# Patient Record
Sex: Female | Born: 1945 | Race: White | Hispanic: No | Smoking: Never smoker
Health system: Southern US, Community
[De-identification: ages and names within clinical notes are randomized; demographics above are authoritative.]

## PROBLEM LIST (undated history)

## (undated) DIAGNOSIS — K5790 Diverticulosis of intestine, part unspecified, without perforation or abscess without bleeding: Secondary | ICD-10-CM

## (undated) DIAGNOSIS — M1990 Unspecified osteoarthritis, unspecified site: Secondary | ICD-10-CM

## (undated) DIAGNOSIS — D649 Anemia, unspecified: Secondary | ICD-10-CM

## (undated) DIAGNOSIS — D500 Iron deficiency anemia secondary to blood loss (chronic): Secondary | ICD-10-CM

## (undated) DIAGNOSIS — I10 Essential (primary) hypertension: Secondary | ICD-10-CM

## (undated) DIAGNOSIS — M47812 Spondylosis without myelopathy or radiculopathy, cervical region: Secondary | ICD-10-CM

## (undated) DIAGNOSIS — D631 Anemia in chronic kidney disease: Secondary | ICD-10-CM

## (undated) HISTORY — PX: ELBOW ARTHROPLASTY: SHX928

## (undated) HISTORY — PX: TOTAL SHOULDER ARTHROPLASTY: SHX126

## (undated) HISTORY — PX: JOINT REPLACEMENT: SHX530

## (undated) HISTORY — PX: HIP SURGERY: SHX245

---
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
## 1999-12-02 ENCOUNTER — Encounter: Payer: Self-pay | Admitting: Internal Medicine

## 1999-12-02 ENCOUNTER — Encounter: Admission: RE | Admit: 1999-12-02 | Discharge: 1999-12-02 | Payer: Self-pay | Admitting: Internal Medicine

## 2000-11-26 ENCOUNTER — Ambulatory Visit (HOSPITAL_COMMUNITY): Admission: RE | Admit: 2000-11-26 | Discharge: 2000-11-26 | Payer: Self-pay | Admitting: Gastroenterology

## 2000-12-29 ENCOUNTER — Encounter: Payer: Self-pay | Admitting: Internal Medicine

## 2000-12-29 ENCOUNTER — Encounter: Admission: RE | Admit: 2000-12-29 | Discharge: 2000-12-29 | Payer: Self-pay | Admitting: Internal Medicine

## 2002-04-27 ENCOUNTER — Encounter: Payer: Self-pay | Admitting: Internal Medicine

## 2002-04-27 ENCOUNTER — Encounter: Admission: RE | Admit: 2002-04-27 | Discharge: 2002-04-27 | Payer: Self-pay | Admitting: Internal Medicine

## 2003-05-01 ENCOUNTER — Encounter: Admission: RE | Admit: 2003-05-01 | Discharge: 2003-05-01 | Payer: Self-pay | Admitting: Internal Medicine

## 2003-05-10 ENCOUNTER — Other Ambulatory Visit: Admission: RE | Admit: 2003-05-10 | Discharge: 2003-05-10 | Payer: Self-pay | Admitting: Internal Medicine

## 2003-12-29 ENCOUNTER — Encounter: Admission: RE | Admit: 2003-12-29 | Discharge: 2003-12-29 | Payer: Self-pay | Admitting: Rheumatology

## 2004-01-11 ENCOUNTER — Other Ambulatory Visit: Admission: RE | Admit: 2004-01-11 | Discharge: 2004-01-11 | Payer: Self-pay | Admitting: Internal Medicine

## 2004-01-15 ENCOUNTER — Encounter: Admission: RE | Admit: 2004-01-15 | Discharge: 2004-01-15 | Payer: Self-pay | Admitting: Rheumatology

## 2004-02-12 ENCOUNTER — Other Ambulatory Visit: Admission: RE | Admit: 2004-02-12 | Discharge: 2004-02-12 | Payer: Self-pay | Admitting: Obstetrics and Gynecology

## 2004-05-22 ENCOUNTER — Other Ambulatory Visit: Admission: RE | Admit: 2004-05-22 | Discharge: 2004-05-22 | Payer: Self-pay | Admitting: Obstetrics and Gynecology

## 2004-07-30 ENCOUNTER — Encounter: Admission: RE | Admit: 2004-07-30 | Discharge: 2004-07-30 | Payer: Self-pay | Admitting: Internal Medicine

## 2004-08-01 ENCOUNTER — Other Ambulatory Visit: Admission: RE | Admit: 2004-08-01 | Discharge: 2004-08-01 | Payer: Self-pay | Admitting: Obstetrics and Gynecology

## 2004-09-16 ENCOUNTER — Other Ambulatory Visit: Admission: RE | Admit: 2004-09-16 | Discharge: 2004-09-16 | Payer: Self-pay | Admitting: Obstetrics and Gynecology

## 2004-10-03 ENCOUNTER — Encounter: Admission: RE | Admit: 2004-10-03 | Discharge: 2004-10-03 | Payer: Self-pay | Admitting: Obstetrics and Gynecology

## 2005-03-21 ENCOUNTER — Other Ambulatory Visit: Admission: RE | Admit: 2005-03-21 | Discharge: 2005-03-21 | Payer: Self-pay | Admitting: Obstetrics and Gynecology

## 2005-07-31 ENCOUNTER — Encounter: Admission: RE | Admit: 2005-07-31 | Discharge: 2005-07-31 | Payer: Self-pay | Admitting: Internal Medicine

## 2005-09-19 ENCOUNTER — Other Ambulatory Visit: Admission: RE | Admit: 2005-09-19 | Discharge: 2005-09-19 | Payer: Self-pay | Admitting: Obstetrics and Gynecology

## 2006-03-11 ENCOUNTER — Encounter: Admission: RE | Admit: 2006-03-11 | Discharge: 2006-03-11 | Payer: Self-pay | Admitting: *Deleted

## 2006-03-30 ENCOUNTER — Other Ambulatory Visit: Admission: RE | Admit: 2006-03-30 | Discharge: 2006-03-30 | Payer: Self-pay | Admitting: Obstetrics and Gynecology

## 2006-04-22 ENCOUNTER — Encounter: Admission: RE | Admit: 2006-04-22 | Discharge: 2006-04-22 | Payer: Self-pay | Admitting: Neurosurgery

## 2006-08-17 ENCOUNTER — Encounter: Admission: RE | Admit: 2006-08-17 | Discharge: 2006-08-17 | Payer: Self-pay | Admitting: Obstetrics and Gynecology

## 2006-09-24 ENCOUNTER — Other Ambulatory Visit: Admission: RE | Admit: 2006-09-24 | Discharge: 2006-09-24 | Payer: Self-pay | Admitting: Obstetrics and Gynecology

## 2007-03-24 ENCOUNTER — Other Ambulatory Visit: Admission: RE | Admit: 2007-03-24 | Discharge: 2007-03-24 | Payer: Self-pay | Admitting: Obstetrics and Gynecology

## 2007-07-21 ENCOUNTER — Other Ambulatory Visit: Admission: RE | Admit: 2007-07-21 | Discharge: 2007-07-21 | Payer: Self-pay | Admitting: Obstetrics and Gynecology

## 2007-08-23 ENCOUNTER — Encounter: Admission: RE | Admit: 2007-08-23 | Discharge: 2007-08-23 | Payer: Self-pay | Admitting: Obstetrics and Gynecology

## 2008-02-02 ENCOUNTER — Other Ambulatory Visit: Admission: RE | Admit: 2008-02-02 | Discharge: 2008-02-02 | Payer: Self-pay | Admitting: Obstetrics and Gynecology

## 2008-07-24 ENCOUNTER — Other Ambulatory Visit: Admission: RE | Admit: 2008-07-24 | Discharge: 2008-07-24 | Payer: Self-pay | Admitting: Obstetrics and Gynecology

## 2008-08-23 ENCOUNTER — Encounter: Admission: RE | Admit: 2008-08-23 | Discharge: 2008-08-23 | Payer: Self-pay | Admitting: Obstetrics and Gynecology

## 2008-12-26 ENCOUNTER — Other Ambulatory Visit: Admission: RE | Admit: 2008-12-26 | Discharge: 2008-12-26 | Payer: Self-pay | Admitting: Obstetrics and Gynecology

## 2009-06-26 ENCOUNTER — Other Ambulatory Visit: Admission: RE | Admit: 2009-06-26 | Discharge: 2009-06-26 | Payer: Self-pay | Admitting: Obstetrics and Gynecology

## 2009-07-06 ENCOUNTER — Ambulatory Visit: Payer: Self-pay | Admitting: Diagnostic Radiology

## 2009-07-06 ENCOUNTER — Emergency Department (HOSPITAL_BASED_OUTPATIENT_CLINIC_OR_DEPARTMENT_OTHER): Admission: EM | Admit: 2009-07-06 | Discharge: 2009-07-06 | Payer: Self-pay | Admitting: Emergency Medicine

## 2010-05-05 ENCOUNTER — Encounter: Payer: Self-pay | Admitting: *Deleted

## 2010-06-10 ENCOUNTER — Other Ambulatory Visit: Payer: Self-pay | Admitting: Obstetrics and Gynecology

## 2010-06-10 DIAGNOSIS — Z1231 Encounter for screening mammogram for malignant neoplasm of breast: Secondary | ICD-10-CM

## 2010-06-19 ENCOUNTER — Other Ambulatory Visit: Payer: Self-pay | Admitting: Obstetrics and Gynecology

## 2010-06-19 ENCOUNTER — Other Ambulatory Visit (HOSPITAL_COMMUNITY)
Admission: RE | Admit: 2010-06-19 | Discharge: 2010-06-19 | Disposition: A | Discharge status: Home / Self Care | Payer: BC Managed Care – PPO | Source: Ambulatory Visit | Attending: Obstetrics and Gynecology | Admitting: Obstetrics and Gynecology

## 2010-06-19 DIAGNOSIS — Z01419 Encounter for gynecological examination (general) (routine) without abnormal findings: Secondary | ICD-10-CM | POA: Insufficient documentation

## 2010-06-19 DIAGNOSIS — Z1159 Encounter for screening for other viral diseases: Secondary | ICD-10-CM | POA: Insufficient documentation

## 2010-06-24 ENCOUNTER — Ambulatory Visit
Admission: RE | Admit: 2010-06-24 | Discharge: 2010-06-24 | Disposition: A | Discharge status: Home / Self Care | Payer: BC Managed Care – PPO | Source: Ambulatory Visit | Attending: Obstetrics and Gynecology | Admitting: Obstetrics and Gynecology

## 2010-06-24 DIAGNOSIS — Z1231 Encounter for screening mammogram for malignant neoplasm of breast: Secondary | ICD-10-CM

## 2010-07-09 ENCOUNTER — Other Ambulatory Visit: Payer: Self-pay | Admitting: Obstetrics and Gynecology

## 2011-07-09 ENCOUNTER — Other Ambulatory Visit: Payer: Self-pay | Admitting: Obstetrics and Gynecology

## 2011-07-09 ENCOUNTER — Other Ambulatory Visit (HOSPITAL_COMMUNITY)
Admission: RE | Admit: 2011-07-09 | Discharge: 2011-07-09 | Disposition: A | Discharge status: Home / Self Care | Payer: PRIVATE HEALTH INSURANCE | Source: Ambulatory Visit | Attending: Obstetrics and Gynecology | Admitting: Obstetrics and Gynecology

## 2011-07-09 DIAGNOSIS — Z01419 Encounter for gynecological examination (general) (routine) without abnormal findings: Secondary | ICD-10-CM | POA: Insufficient documentation

## 2011-07-28 ENCOUNTER — Other Ambulatory Visit: Payer: Self-pay | Admitting: Internal Medicine

## 2011-07-28 DIAGNOSIS — Z1231 Encounter for screening mammogram for malignant neoplasm of breast: Secondary | ICD-10-CM

## 2011-08-04 ENCOUNTER — Ambulatory Visit
Admission: RE | Admit: 2011-08-04 | Discharge: 2011-08-04 | Disposition: A | Discharge status: Home / Self Care | Payer: PRIVATE HEALTH INSURANCE | Source: Ambulatory Visit | Attending: Internal Medicine | Admitting: Internal Medicine

## 2011-08-04 DIAGNOSIS — Z1231 Encounter for screening mammogram for malignant neoplasm of breast: Secondary | ICD-10-CM

## 2012-05-07 ENCOUNTER — Ambulatory Visit: Payer: BC Managed Care – PPO

## 2012-05-07 ENCOUNTER — Telehealth: Payer: Self-pay | Admitting: Radiology

## 2012-05-07 ENCOUNTER — Ambulatory Visit (INDEPENDENT_AMBULATORY_CARE_PROVIDER_SITE_OTHER): Payer: BC Managed Care – PPO | Admitting: Internal Medicine

## 2012-05-07 VITALS — BP 129/65 | HR 97 | Temp 98.3°F | Resp 16 | Ht 62.0 in | Wt 131.0 lb

## 2012-05-07 DIAGNOSIS — W19XXXA Unspecified fall, initial encounter: Secondary | ICD-10-CM

## 2012-05-07 DIAGNOSIS — Z23 Encounter for immunization: Secondary | ICD-10-CM

## 2012-05-07 DIAGNOSIS — S0180XA Unspecified open wound of other part of head, initial encounter: Secondary | ICD-10-CM

## 2012-05-07 DIAGNOSIS — S0990XA Unspecified injury of head, initial encounter: Secondary | ICD-10-CM

## 2012-05-07 DIAGNOSIS — I10 Essential (primary) hypertension: Secondary | ICD-10-CM | POA: Insufficient documentation

## 2012-05-07 DIAGNOSIS — R079 Chest pain, unspecified: Secondary | ICD-10-CM

## 2012-05-07 DIAGNOSIS — D649 Anemia, unspecified: Secondary | ICD-10-CM

## 2012-05-07 DIAGNOSIS — M069 Rheumatoid arthritis, unspecified: Secondary | ICD-10-CM | POA: Insufficient documentation

## 2012-05-07 DIAGNOSIS — S20219A Contusion of unspecified front wall of thorax, initial encounter: Secondary | ICD-10-CM

## 2012-05-07 DIAGNOSIS — R42 Dizziness and giddiness: Secondary | ICD-10-CM

## 2012-05-07 LAB — COMPREHENSIVE METABOLIC PANEL
AST: 36 U/L (ref 0–37)
Alkaline Phosphatase: 73 U/L (ref 39–117)
BUN: 28 mg/dL — ABNORMAL HIGH (ref 6–23)
Creat: 1.19 mg/dL — ABNORMAL HIGH (ref 0.50–1.10)
Glucose, Bld: 99 mg/dL (ref 70–99)
Potassium: 4 mEq/L (ref 3.5–5.3)
Total Bilirubin: 0.3 mg/dL (ref 0.3–1.2)

## 2012-05-07 LAB — POCT CBC
Granulocyte percent: 63.7 %G (ref 37–80)
Lymph, poc: 3.2 (ref 0.6–3.4)
MCH, POC: 27.5 pg (ref 27–31.2)
MCHC: 30.7 g/dL — AB (ref 31.8–35.4)
MCV: 89.6 fL (ref 80–97)
MID (cbc): 1.3 — AB (ref 0–0.9)
POC LYMPH PERCENT: 26.1 %L (ref 10–50)
Platelet Count, POC: 317 10*3/uL (ref 142–424)
RDW, POC: 15.5 %

## 2012-05-07 MED ORDER — HYDROCODONE-ACETAMINOPHEN 5-325 MG PO TABS
1.0000 | ORAL_TABLET | Freq: Four times a day (QID) | ORAL | Status: DC | PRN
Start: 2012-05-07 — End: 2012-05-20

## 2012-05-07 NOTE — Progress Notes (Signed)
Subjective:    Patient ID: Mallory Murphy, female    DOB: 12/01/1945, 67 y.o.   MRN: [id number]  HPI she fell at work today striking her head near the right in her right anterior chest on the concrete floor No loss of consciousness/laceration of face/pain in ribs on the right with deep breathing or movement No headache History of dizziness with abrupt postural changes over the last month/no vertigo/no dizziness while sitting No palpitations or chest pain No history of arrhythmia or heart disease other than hypertension Past history of anemia thought secondary to rheumatoid arthritis No other recent change and health There is no history of other falls   Past medical history-rheumatoid arthritis -Hypertension -On Lexapro -History of anemia as low as 9  Primary care-Dr. Pol[facility]//last labs almost one year ago  Review of Systems No vision changes/no headaches/no sinus issues/no hearing changes No history of thyroid problems Chest pain palpitations edema No dyspnea on exertion No GI upset     Objective:   Physical Exam BP 129/65  Pulse 97  Temp 98.3 F (36.8 C) (Oral)  Resp 16  Ht 5\' 2"  (1.575 m)  Wt 131 lb (59.421 kg)  BMI 23.96 kg/m2  SpO2 100% In no acute distress Pupils equal round reactive to light and accommodation/extraocular movements intact There is a 3 cm laceration lateral to the right eyebrow/no deep structures are exposed Nose and teeth are clear/no orbital ecchymoses Neck is supple Heart is regular without murmur Lungs are clear There is an area of tenderness over the right lower anterior rib cage but no defect or ecchymoses Abdomen is supple  Cranial nerves II through XII are intact. No sensory or motor losses. Gait is normal. Romberg negative     [facility] reading (PRIMARY) by  Dr.Delonda Coley= old fracture of the right third rib but no new fractures EKG with nonspecific ST changes Results for orders placed in visit on 05/07/12  POCT CBC   Component Value Range   WBC 12.4 (*) 4.6 - 10.2 K/uL   Lymph, poc 3.2  0.6 - 3.4   POC LYMPH PERCENT 26.1  10 - 50 %L   MID (cbc) 1.3 (*) 0 - 0.9   POC MID % 10.2  0 - 12 %M   POC Granulocyte 7.9 (*) 2 - 6.9   Granulocyte percent 63.7  37 - 80 %G   RBC 3.67 (*) 4.04 - 5.48 M/uL   Hemoglobin 10.1 (*) 12.2 - 16.2 g/dL   HCT, POC 40.9 (*) 81.1 - 47.9 %   MCV 89.6  80 - 97 fL   MCH, POC 27.5  27 - 31.2 pg   MCHC 30.7 (*) 31.8 - 35.4 g/dL   RDW, POC 91.4     Platelet Count, POC 317  142 - 424 K/uL   MPV 7.1  0 - 99.8 fL       Assessment & Plan:   1. Contusion of chest  DG Ribs Unilateral W/Chest Right, EKG 12-Lead  2. HTN (hypertension)    3. Arthritis, rheumatoid    4. Fall    5. Dizziness  Comprehensive metabolic panel  6. Anemia  POCT CBC  7. Need for prophylactic vaccination and inoculation against influenza  Flu vaccine greater than or equal to 3yo preservative free IM  8. Chest pain    9. Open wound, face, without complication    10. Head injury, acute, without loss of consciousness     Procedure the PA-C Marte Hydrocodone 5325 if needed Last  tetanus 2009 Suture removal 7d

## 2012-05-07 NOTE — Progress Notes (Signed)
Patient ID: SISTER CARBONE MRN: [id number], DOB: 1945-10-13, 67 y.o. Date of Encounter: 05/07/2012, 3:41 PM   PROCEDURE NOTE: Verbal consent obtained. Sterile technique employed. Numbing: Anesthesia obtained with 1% lidocaine with epinephrine.   Cleansed with soap and water. Irrigated.  Wound explored, no deep structures involved, no foreign bodies.   Wound repaired with # 5 SI and #1 purse string suture.  Hemostasis obtained. Wound cleansed and dressed.  Wound care instructions including precautions covered with patient. Handout given.  Anticipate suture removal in 5 days  Rhoderick Moody, PA-C 05/07/2012 3:41 PM

## 2012-05-07 NOTE — Telephone Encounter (Signed)
Last Tetanus was 2009 per her PCP office.

## 2012-05-07 NOTE — Patient Instructions (Addendum)
WOUND CARE Please return in 5 days to have your stitches/staples removed or sooner if you have concerns.  Keep area clean and dry for 24 hours. Do not remove bandage, if applied.  After 24 hours, remove bandage and wash wound gently with mild soap and warm water. Reapply a new bandage after cleaning wound, if directed.  Continue daily cleansing with soap and water until stitches/staples are removed.  Do not apply any ointments or creams to the wound while stitches/staples are in place, as this may cause delayed healing.  Notify the office if you experience any of the following signs of infection: Swelling, redness, pus drainage, streaking, fever >101.0 F  Notify the office if you experience excessive bleeding that does not stop after 15-20 minutes of constant, firm pressure.  

## 2012-05-09 ENCOUNTER — Encounter: Payer: Self-pay | Admitting: Internal Medicine

## 2012-05-13 ENCOUNTER — Ambulatory Visit (INDEPENDENT_AMBULATORY_CARE_PROVIDER_SITE_OTHER): Payer: BC Managed Care – PPO | Admitting: Physician Assistant

## 2012-05-13 VITALS — BP 124/62 | HR 97 | Temp 98.1°F | Resp 16 | Ht 62.0 in | Wt 134.8 lb

## 2012-05-13 DIAGNOSIS — S0180XA Unspecified open wound of other part of head, initial encounter: Secondary | ICD-10-CM

## 2012-05-13 DIAGNOSIS — Z4802 Encounter for removal of sutures: Secondary | ICD-10-CM

## 2012-05-13 NOTE — Progress Notes (Signed)
   Patient ID: Mallory Murphy MRN: [id number], DOB: 08-Jun-1945 67 y.o. Date of Encounter: 05/13/2012, 2:53 PM  Primary Physician: No primary provider on file.  Chief Complaint: Suture removal    See note from 05/07/12  HPI: 67 y.o. y/o female with injury to right eyebrow.  Here for suture removal s/p placement on 05/07/12 Doing well Afebrile/ No chills No erythema No pain Normal sensation No headache, dizziness, or visual changes.  Still with some soreness along her right lateral ribs.  This is improved from her initial visit.  She is performing deep breathing exercises at home.  Negative rib film and CXR at initial visit.   No past medical history on file.   Home Meds: Prior to Admission medications   Medication Sig Start Date End Date Taking? Authorizing Provider  escitalopram (LEXAPRO) 10 MG tablet Take 10 mg by mouth daily.   Yes Historical Provider, MD  hydrochlorothiazide (MICROZIDE) 12.5 MG capsule Take 12.5 mg by mouth daily.   Yes Historical Provider, MD  HYDROcodone-acetaminophen (NORCO/VICODIN) 5-325 MG per tablet Take 1 tablet by mouth every 6 (six) hours as needed for pain. 05/07/12  Yes Tonye Pearson, MD  lisinopril (PRINIVIL,ZESTRIL) 40 MG tablet Take 40 mg by mouth daily.   Yes Historical Provider, MD  meloxicam (MOBIC) 15 MG tablet Take 15 mg by mouth daily.   Yes Historical Provider, MD  tolterodine (DETROL) 2 MG tablet Take 2 mg by mouth daily.   Yes Historical Provider, MD  traMADol (ULTRAM) 50 MG tablet Take 50 mg by mouth every 8 (eight) hours as needed.   Yes Historical Provider, MD    Allergies:  Allergies  Allergen Reactions  . Macrobid (Nitrofurantoin Macrocrystal) Nausea Only    Physical Exam: Blood pressure 124/62, pulse 97, temperature 98.1 F (36.7 C), temperature source Oral, resp. rate 16, height 5\' 2"  (1.575 m), weight 134 lb 12.8 oz (61.145 kg), SpO2 97.00%., Body mass index is 24.66 kg/(m^2). General: Well developed, well nourished,  in no acute distress. Head: Normocephalic, atraumatic, sclera non-icteric, no xanthomas, nares are without discharge.  Neck: Supple. Lungs: Breathing is unlabored. Heart: Normal rate. Msk:  Strength and tone appear normal for age. Wound: Wound well healed without erythema, swelling, or tenderness to palpation.  Skin: See above, otherwise dry without rash or erythema. Extremities: No clubbing or cyanosis. No edema. Neuro: Alert and oriented X 3. Moves all extremities spontaneously.  Psych:  Responds to questions appropriately with a normal affect.   PROCEDURE: Verbal consent obtained. 5 simple interrupted sutures and 1 purse string suture removed without difficulty.  Assessment and Plan: 67 y.o. y/o female here for suture removal for wound described above and rib contusion. 1) wound -Sutures removed per above -Wound resolved -RTC prn  2) Rib contusion  -Continue deep breathing exercises -Improving -Call if still symptomatic in one week  Signed, Eula Listen, PA-C 05/13/2012 2:53 PM

## 2012-05-20 ENCOUNTER — Telehealth: Payer: Self-pay

## 2012-05-20 MED ORDER — HYDROCODONE-ACETAMINOPHEN 5-325 MG PO TABS
1.0000 | ORAL_TABLET | Freq: Four times a day (QID) | ORAL | Status: DC | PRN
Start: 2012-05-20 — End: 2016-05-28

## 2012-05-20 NOTE — Telephone Encounter (Signed)
Patient states she is still having some pain with her ribs esp with bending forward or with deep breath, wants to know if normal. She feels "catching" she wants to know if she can get a refill on her Hydrocodone. She also wants to know if anything else needs to be done for continued c/o rib pain,states it is better but still painful/ please advise.

## 2012-05-20 NOTE — Telephone Encounter (Signed)
PT STATES SHE WAS SEEN FOR A RIB INJURY AND WAS TOLD TO CALL BACK IF SHE WAS STILL IN PAIN AND SHE IS WOULD LIKE TO SPEAK WITH RYAN OR HIS NURSE AT [phone] OR HER HOME AT [phone]

## 2012-05-20 NOTE — Telephone Encounter (Signed)
Since I saw the patient for suture removal of her eyelid only and advised her at that time that it will take sometime for her ribs to heal I will refill her medication, but advise that she should recheck as well. I have printed and signed the Norco prescription. It is at the TL desk.

## 2012-05-20 NOTE — Telephone Encounter (Signed)
Patient returned call. I advised and she voiced understanding. I called in her script for hydrocodone to [address] in [place]

## 2012-09-28 ENCOUNTER — Other Ambulatory Visit: Payer: Self-pay | Admitting: Dermatology

## 2012-10-26 ENCOUNTER — Other Ambulatory Visit: Payer: Self-pay

## 2012-10-26 DIAGNOSIS — Z1231 Encounter for screening mammogram for malignant neoplasm of breast: Secondary | ICD-10-CM

## 2012-11-16 ENCOUNTER — Ambulatory Visit: Payer: PRIVATE HEALTH INSURANCE

## 2012-11-29 ENCOUNTER — Ambulatory Visit: Payer: PRIVATE HEALTH INSURANCE

## 2013-02-28 ENCOUNTER — Ambulatory Visit
Admission: RE | Admit: 2013-02-28 | Discharge: 2013-02-28 | Disposition: A | Discharge status: Home / Self Care | Payer: BC Managed Care – PPO | Source: Ambulatory Visit

## 2013-02-28 DIAGNOSIS — Z1231 Encounter for screening mammogram for malignant neoplasm of breast: Secondary | ICD-10-CM

## 2013-05-17 ENCOUNTER — Emergency Department (HOSPITAL_BASED_OUTPATIENT_CLINIC_OR_DEPARTMENT_OTHER)
Admission: EM | Admit: 2013-05-17 | Discharge: 2013-05-17 | Disposition: A | Discharge status: Home / Self Care | Payer: 59 | Attending: Emergency Medicine | Admitting: Emergency Medicine

## 2013-05-17 ENCOUNTER — Encounter (HOSPITAL_BASED_OUTPATIENT_CLINIC_OR_DEPARTMENT_OTHER): Payer: Self-pay | Admitting: Emergency Medicine

## 2013-05-17 DIAGNOSIS — K529 Noninfective gastroenteritis and colitis, unspecified: Secondary | ICD-10-CM

## 2013-05-17 DIAGNOSIS — Z791 Long term (current) use of non-steroidal anti-inflammatories (NSAID): Secondary | ICD-10-CM | POA: Insufficient documentation

## 2013-05-17 DIAGNOSIS — I10 Essential (primary) hypertension: Secondary | ICD-10-CM | POA: Insufficient documentation

## 2013-05-17 DIAGNOSIS — R6883 Chills (without fever): Secondary | ICD-10-CM | POA: Insufficient documentation

## 2013-05-17 DIAGNOSIS — K5289 Other specified noninfective gastroenteritis and colitis: Secondary | ICD-10-CM | POA: Insufficient documentation

## 2013-05-17 DIAGNOSIS — M069 Rheumatoid arthritis, unspecified: Secondary | ICD-10-CM | POA: Insufficient documentation

## 2013-05-17 DIAGNOSIS — Z3202 Encounter for pregnancy test, result negative: Secondary | ICD-10-CM | POA: Insufficient documentation

## 2013-05-17 DIAGNOSIS — Z79899 Other long term (current) drug therapy: Secondary | ICD-10-CM | POA: Insufficient documentation

## 2013-05-17 HISTORY — DX: Unspecified osteoarthritis, unspecified site: M19.90

## 2013-05-17 HISTORY — DX: Essential (primary) hypertension: I10

## 2013-05-17 LAB — COMPREHENSIVE METABOLIC PANEL
ALT: 9 U/L (ref 0–35)
AST: 22 U/L (ref 0–37)
Albumin: 4.1 g/dL (ref 3.5–5.2)
Alkaline Phosphatase: 69 U/L (ref 39–117)
BUN: 15 mg/dL (ref 6–23)
CO2: 24 mEq/L (ref 19–32)
Calcium: 9.4 mg/dL (ref 8.4–10.5)
Chloride: 100 mEq/L (ref 96–112)
Creatinine, Ser: 1 mg/dL (ref 0.50–1.10)
GFR calc Af Amer: 66 mL/min — ABNORMAL LOW (ref >90)
GFR calc non Af Amer: 57 mL/min — ABNORMAL LOW (ref >90)
Glucose, Bld: 137 mg/dL — ABNORMAL HIGH (ref 70–99)
Potassium: 3.2 mEq/L — ABNORMAL LOW (ref 3.7–5.3)
Sodium: 141 mEq/L (ref 137–147)
Total Bilirubin: 0.8 mg/dL (ref 0.3–1.2)
Total Protein: 8.3 g/dL (ref 6.0–8.3)

## 2013-05-17 LAB — CBC WITH DIFFERENTIAL/PLATELET
Basophils Absolute: 0 10*3/uL (ref 0.0–0.1)
Basophils Relative: 0 % (ref 0–1)
Eosinophils Absolute: 0 10*3/uL (ref 0.0–0.7)
Eosinophils Relative: 0 % (ref 0–5)
HCT: 33.8 % — ABNORMAL LOW (ref 36.0–46.0)
Hemoglobin: 11.4 g/dL — ABNORMAL LOW (ref 12.0–15.0)
Lymphocytes Relative: 13 % (ref 12–46)
Lymphs Abs: 1.3 10*3/uL (ref 0.7–4.0)
MCH: 30.7 pg (ref 26.0–34.0)
MCHC: 33.7 g/dL (ref 30.0–36.0)
MCV: 91.1 fL (ref 78.0–100.0)
Monocytes Absolute: 0.8 10*3/uL (ref 0.1–1.0)
Monocytes Relative: 8 % (ref 3–12)
Neutro Abs: 8.1 10*3/uL — ABNORMAL HIGH (ref 1.7–7.7)
Neutrophils Relative %: 79 % — ABNORMAL HIGH (ref 43–77)
Platelets: 280 10*3/uL (ref 150–400)
RBC: 3.71 MIL/uL — ABNORMAL LOW (ref 3.87–5.11)
RDW: 14.7 % (ref 11.5–15.5)
WBC: 10.3 10*3/uL (ref 4.0–10.5)

## 2013-05-17 LAB — PREGNANCY, URINE: Preg Test, Ur: NEGATIVE

## 2013-05-17 LAB — URINE MICROSCOPIC-ADD ON

## 2013-05-17 LAB — URINALYSIS, ROUTINE W REFLEX MICROSCOPIC
Bilirubin Urine: NEGATIVE
Glucose, UA: NEGATIVE mg/dL
Ketones, ur: 15 mg/dL — AB
Nitrite: NEGATIVE
Protein, ur: NEGATIVE mg/dL
SPECIFIC GRAVITY, URINE: 1.017 (ref 1.005–1.030)
UROBILINOGEN UA: 1 mg/dL (ref 0.0–1.0)
pH: 5.5 (ref 5.0–8.0)

## 2013-05-17 LAB — LIPASE, BLOOD: Lipase: 20 U/L (ref 11–59)

## 2013-05-17 MED ORDER — ONDANSETRON 8 MG PO TBDP: ORAL_TABLET | ORAL | Status: DC

## 2013-05-17 MED ORDER — CIPROFLOXACIN HCL 500 MG PO TABS: 500.0000 mg | ORAL_TABLET | Freq: Two times a day (BID) | ORAL | Status: DC

## 2013-05-17 MED ORDER — ONDANSETRON HCL 4 MG/2ML IJ SOLN
4.0000 mg | Freq: Once | INTRAMUSCULAR | Status: AC
  Administered 2013-05-17: 4 mg via INTRAVENOUS
  Filled 2013-05-17: qty 2

## 2013-05-17 MED ORDER — SODIUM CHLORIDE 0.9 % IV SOLN
Freq: Once | INTRAVENOUS | Status: AC
  Administered 2013-05-17: 11:00:00 via INTRAVENOUS

## 2013-05-17 NOTE — Discharge Instructions (Signed)
Zofran as prescribed as needed for nausea.  Return to the emergency department if you develop severe abdominal pain, bloody stool, no urine output in 12 hours, or other new or concerning symptoms.   Viral Gastroenteritis Viral gastroenteritis is also known as stomach flu. This condition affects the stomach and intestinal tract. It can cause sudden diarrhea and vomiting. The illness typically lasts 3 to 8 days. Most people develop an immune response that eventually gets rid of the virus. While this natural response develops, the virus can make you quite ill. CAUSES  Many different viruses can cause gastroenteritis, such as rotavirus or noroviruses. You can catch one of these viruses by consuming contaminated food or water. You may also catch a virus by sharing utensils or other personal items with an infected person or by touching a contaminated surface. SYMPTOMS  The most common symptoms are diarrhea and vomiting. These problems can cause a severe loss of body fluids (dehydration) and a body salt (electrolyte) imbalance. Other symptoms may include:  Fever.  Headache.  Fatigue.  Abdominal pain. DIAGNOSIS  Your caregiver can usually diagnose viral gastroenteritis based on your symptoms and a physical exam. A stool sample may also be taken to test for the presence of viruses or other infections. TREATMENT  This illness typically goes away on its own. Treatments are aimed at rehydration. The most serious cases of viral gastroenteritis involve vomiting so severely that you are not able to keep fluids down. In these cases, fluids must be given through an intravenous line (IV). HOME CARE INSTRUCTIONS   Drink enough fluids to keep your urine clear or pale yellow. Drink small amounts of fluids frequently and increase the amounts as tolerated.  Ask your caregiver for specific rehydration instructions.  Avoid:  Foods high in sugar.  Alcohol.  Carbonated  drinks.  Tobacco.  Juice.  Caffeine drinks.  Extremely hot or cold fluids.  Fatty, greasy foods.  Too much intake of anything at one time.  Dairy products until 24 to 48 hours after diarrhea stops.  You may consume probiotics. Probiotics are active cultures of beneficial bacteria. They may lessen the amount and number of diarrheal stools in adults. Probiotics can be found in yogurt with active cultures and in supplements.  Wash your hands well to avoid spreading the virus.  Only take over-the-counter or prescription medicines for pain, discomfort, or fever as directed by your caregiver. Do not give aspirin to children. Antidiarrheal medicines are not recommended.  Ask your caregiver if you should continue to take your regular prescribed and over-the-counter medicines.  Keep all follow-up appointments as directed by your caregiver. SEEK IMMEDIATE MEDICAL CARE IF:   You are unable to keep fluids down.  You do not urinate at least once every 6 to 8 hours.  You develop shortness of breath.  You notice blood in your stool or vomit. This may look like coffee grounds.  You have abdominal pain that increases or is concentrated in one small area (localized).  You have persistent vomiting or diarrhea.  You have a fever.  The patient is a child younger than 3 months, and he or she has a fever.  The patient is a child older than 3 months, and he or she has a fever and persistent symptoms.  The patient is a child older than 3 months, and he or she has a fever and symptoms suddenly get worse.  The patient is a baby, and he or she has no tears when crying. MAKE SURE  YOU:   Understand these instructions.  Will watch your condition.  Will get help right away if you are not doing well or get worse. Document Released: 03/31/2005 Document Revised: 06/23/2011 Document Reviewed: 01/15/2011 [facility] Patient Information 2014 [place].

## 2013-05-17 NOTE — ED Provider Notes (Signed)
CSN: [id number]     Arrival date & time 05/17/13  9470 History   First MD Initiated Contact with Patient 05/17/13 1044     Chief Complaint  Patient presents with  . Emesis  . Diarrhea   (Consider location/radiation/quality/duration/timing/severity/associated sxs/prior Treatment) HPI Comments: Patient is a 68 year old female with history of hypertension and rheumatoid arthritis. She presents today with complaints of abdominal cramping with nausea vomiting and diarrhea for the past 4 days. All of this has been nonbloody. She denies any ill contacts.  Patient is a 68 y.o. female presenting with vomiting. The history is provided by the patient.  Emesis Severity:  Moderate Duration:  5 days Timing:  Constant Progression:  Worsening Chronicity:  New Recent urination:  Normal Relieved by:  Nothing Worsened by:  Nothing tried Ineffective treatments:  None tried Associated symptoms: abdominal pain, chills, diarrhea and fever     Past Medical History  Diagnosis Date  . Hypertension   . Arthritis    Past Surgical History  Procedure Laterality Date  . Joint replacement     No family history on file. History  Substance Use Topics  . Smoking status: Never Smoker   . Smokeless tobacco: Not on file  . Alcohol Use: Yes   OB History   Grav Para Term Preterm Abortions TAB SAB Ect Mult Living                 Review of Systems  Constitutional: Positive for chills.  Gastrointestinal: Positive for vomiting, abdominal pain and diarrhea.  All other systems reviewed and are negative.    Allergies  Macrobid  Home Medications   Current Outpatient Rx  Name  Route  Sig  Dispense  Refill  . escitalopram (LEXAPRO) 10 MG tablet   Oral   Take 10 mg by mouth daily.         . hydrochlorothiazide (MICROZIDE) 12.5 MG capsule   Oral   Take 12.5 mg by mouth daily.         Marland Kitchen HYDROcodone-acetaminophen (NORCO/VICODIN) 5-325 MG per tablet   Oral   Take 1 tablet by mouth every 6 (six)  hours as needed for pain.   15 tablet   0   . lisinopril (PRINIVIL,ZESTRIL) 40 MG tablet   Oral   Take 40 mg by mouth daily.         . meloxicam (MOBIC) 15 MG tablet   Oral   Take 15 mg by mouth daily.         Marland Kitchen tolterodine (DETROL) 2 MG tablet   Oral   Take 2 mg by mouth daily.         . traMADol (ULTRAM) 50 MG tablet   Oral   Take 50 mg by mouth every 8 (eight) hours as needed.          Pulse 84  Temp(Src) 98.5 F (36.9 C) (Oral)  Resp 18  Ht 5\' 2"  (1.575 m)  Wt 130 lb (58.968 kg)  BMI 23.77 kg/m2  SpO2 100% Physical Exam  Nursing note and vitals reviewed. Constitutional: She is oriented to person, place, and time. She appears well-developed and well-nourished. No distress.  HENT:  Head: Normocephalic and atraumatic.  Mucous membranes appear somewhat dry.  Neck: Normal range of motion. Neck supple.  Cardiovascular: Normal rate and regular rhythm.  Exam reveals no gallop and no friction rub.   No murmur heard. Pulmonary/Chest: Effort normal and breath sounds normal. No respiratory distress. She has no wheezes.  Abdominal:  Soft. Bowel sounds are normal. She exhibits no distension. There is no tenderness.  Musculoskeletal: Normal range of motion.  Neurological: She is alert and oriented to person, place, and time.  Skin: Skin is warm and dry. She is not diaphoretic.    ED Course  Procedures (including critical care time) Labs Review Labs Reviewed  CBC WITH DIFFERENTIAL  COMPREHENSIVE METABOLIC PANEL  LIPASE, BLOOD  URINALYSIS, ROUTINE W REFLEX MICROSCOPIC   Imaging Review No results found.    MDM  No diagnosis found. Patient presents here with a several day history of nausea vomiting and diarrhea. Her symptoms sound viral in nature and physical examination and laboratory studies are consistent with this as well. She was given intravenous fluids and Zofran and is now feeling better. Her UA reveals evidence for a UTI. We'll treat with Cipro and  discharged to home to return as needed if symptoms worsen or change.    Veryl Speak, MD 05/17/13 [phone]

## 2013-05-17 NOTE — ED Notes (Signed)
Pt c/o n/v/d and abdominal cramping with n/v/d x 4 days. Pt sts last diarrhea 3 days ago.

## 2013-05-19 LAB — URINE CULTURE: Colony Count: >=100000

## 2013-05-21 ENCOUNTER — Telehealth (HOSPITAL_COMMUNITY): Payer: Self-pay | Admitting: Emergency Medicine

## 2013-05-21 NOTE — ED Notes (Signed)
Post ED Visit - Positive Culture Follow-up  Culture report reviewed by antimicrobial stewardship pharmacist: []  Wes Dulaney, Pharm.D., BCPS []  Heide Guile, Pharm.D., BCPS []  Alycia Rossetti, Pharm.D., BCPS [x]  [place], [place].D., BCPS, AAHIVP []  Legrand Como, Pharm.D., BCPS, AAHIVP  Positive urine culture Treated with Cipro, organism sensitive to the same and no further patient follow-up is required at this time.  Myrna Blazer 05/21/2013, 9:54 AM

## 2013-08-29 ENCOUNTER — Other Ambulatory Visit (HOSPITAL_COMMUNITY)
Admission: RE | Admit: 2013-08-29 | Discharge: 2013-08-29 | Disposition: A | Discharge status: Home / Self Care | Payer: 59 | Source: Ambulatory Visit | Attending: Obstetrics and Gynecology | Admitting: Obstetrics and Gynecology

## 2013-08-29 ENCOUNTER — Other Ambulatory Visit: Payer: Self-pay | Admitting: Obstetrics and Gynecology

## 2013-08-29 DIAGNOSIS — Z1151 Encounter for screening for human papillomavirus (HPV): Secondary | ICD-10-CM | POA: Insufficient documentation

## 2013-08-29 DIAGNOSIS — Z01419 Encounter for gynecological examination (general) (routine) without abnormal findings: Secondary | ICD-10-CM | POA: Insufficient documentation

## 2014-03-23 ENCOUNTER — Other Ambulatory Visit: Payer: Self-pay

## 2014-03-23 ENCOUNTER — Other Ambulatory Visit: Payer: Self-pay | Admitting: Obstetrics and Gynecology

## 2014-03-23 DIAGNOSIS — Z1231 Encounter for screening mammogram for malignant neoplasm of breast: Secondary | ICD-10-CM

## 2014-04-13 ENCOUNTER — Inpatient Hospital Stay: Admission: RE | Admit: 2014-04-13 | Payer: BC Managed Care – PPO | Source: Ambulatory Visit

## 2014-06-21 ENCOUNTER — Observation Stay (HOSPITAL_COMMUNITY)
Admission: EM | Admit: 2014-06-21 | Discharge: 2014-06-22 | Disposition: A | Discharge status: Home / Self Care | Payer: Medicare Other | Attending: Internal Medicine | Admitting: Internal Medicine

## 2014-06-21 ENCOUNTER — Emergency Department (HOSPITAL_COMMUNITY): Payer: Medicare Other

## 2014-06-21 ENCOUNTER — Encounter (HOSPITAL_COMMUNITY): Payer: Self-pay | Admitting: *Deleted

## 2014-06-21 DIAGNOSIS — R42 Dizziness and giddiness: Secondary | ICD-10-CM | POA: Insufficient documentation

## 2014-06-21 DIAGNOSIS — R55 Syncope and collapse: Principal | ICD-10-CM | POA: Insufficient documentation

## 2014-06-21 DIAGNOSIS — N179 Acute kidney failure, unspecified: Secondary | ICD-10-CM | POA: Diagnosis not present

## 2014-06-21 DIAGNOSIS — E876 Hypokalemia: Secondary | ICD-10-CM | POA: Diagnosis not present

## 2014-06-21 DIAGNOSIS — R002 Palpitations: Secondary | ICD-10-CM | POA: Diagnosis present

## 2014-06-21 DIAGNOSIS — I10 Essential (primary) hypertension: Secondary | ICD-10-CM | POA: Diagnosis not present

## 2014-06-21 DIAGNOSIS — M069 Rheumatoid arthritis, unspecified: Secondary | ICD-10-CM

## 2014-06-21 DIAGNOSIS — Z23 Encounter for immunization: Secondary | ICD-10-CM | POA: Diagnosis not present

## 2014-06-21 DIAGNOSIS — E860 Dehydration: Secondary | ICD-10-CM | POA: Diagnosis not present

## 2014-06-21 DIAGNOSIS — I951 Orthostatic hypotension: Secondary | ICD-10-CM | POA: Diagnosis present

## 2014-06-21 LAB — BASIC METABOLIC PANEL
Anion gap: 13 (ref 5–15)
BUN: 22 mg/dL (ref 6–23)
CHLORIDE: 95 mmol/L — AB (ref 96–112)
CO2: 26 mmol/L (ref 19–32)
CREATININE: 1.96 mg/dL — AB (ref 0.50–1.10)
Calcium: 8.6 mg/dL (ref 8.4–10.5)
GFR calc Af Amer: 29 mL/min — ABNORMAL LOW (ref >90)
GFR, EST NON AFRICAN AMERICAN: 25 mL/min — AB (ref >90)
GLUCOSE: 100 mg/dL — AB (ref 70–99)
Potassium: 2.7 mmol/L — CL (ref 3.5–5.1)
SODIUM: 134 mmol/L — AB (ref 135–145)

## 2014-06-21 LAB — CBC
HCT: 31 % — ABNORMAL LOW (ref 36.0–46.0)
HEMOGLOBIN: 10.4 g/dL — AB (ref 12.0–15.0)
MCH: 30.5 pg (ref 26.0–34.0)
MCHC: 33.5 g/dL (ref 30.0–36.0)
MCV: 90.9 fL (ref 78.0–100.0)
Platelets: 327 10*3/uL (ref 150–400)
RBC: 3.41 MIL/uL — ABNORMAL LOW (ref 3.87–5.11)
RDW: 15.2 % (ref 11.5–15.5)
WBC: 10.3 10*3/uL (ref 4.0–10.5)

## 2014-06-21 LAB — MAGNESIUM: MAGNESIUM: 1.4 mg/dL — AB (ref 1.5–2.5)

## 2014-06-21 LAB — TROPONIN I

## 2014-06-21 LAB — I-STAT TROPONIN, ED: TROPONIN I, POC: 0 ng/mL (ref 0.00–0.08)

## 2014-06-21 LAB — BRAIN NATRIURETIC PEPTIDE: B Natriuretic Peptide: 103.7 pg/mL — ABNORMAL HIGH (ref 0.0–100.0)

## 2014-06-21 MED ORDER — ESCITALOPRAM OXALATE 10 MG PO TABS
10.0000 mg | ORAL_TABLET | Freq: Every day | ORAL | Status: DC
  Administered 2014-06-22: 10 mg via ORAL
  Filled 2014-06-21: qty 1

## 2014-06-21 MED ORDER — ONDANSETRON HCL 4 MG PO TABS: 4.0000 mg | ORAL_TABLET | Freq: Four times a day (QID) | ORAL | Status: DC | PRN

## 2014-06-21 MED ORDER — POTASSIUM CHLORIDE 10 MEQ/100ML IV SOLN
10.0000 meq | INTRAVENOUS | Status: AC
  Administered 2014-06-21 (×3): 10 meq via INTRAVENOUS
  Filled 2014-06-21 (×3): qty 100

## 2014-06-21 MED ORDER — INFLUENZA VAC SPLIT QUAD 0.5 ML IM SUSY
0.5000 mL | PREFILLED_SYRINGE | INTRAMUSCULAR | Status: DC
  Filled 2014-06-21: qty 0.5

## 2014-06-21 MED ORDER — METOPROLOL TARTRATE 25 MG PO TABS
25.0000 mg | ORAL_TABLET | Freq: Two times a day (BID) | ORAL | Status: DC
  Administered 2014-06-21 – 2014-06-22 (×2): 25 mg via ORAL
  Filled 2014-06-21 (×3): qty 1

## 2014-06-21 MED ORDER — POTASSIUM CHLORIDE CRYS ER 20 MEQ PO TBCR
40.0000 meq | EXTENDED_RELEASE_TABLET | Freq: Two times a day (BID) | ORAL | Status: DC
  Administered 2014-06-21: 40 meq via ORAL
  Filled 2014-06-21: qty 2

## 2014-06-21 MED ORDER — SODIUM CHLORIDE 0.9 % IJ SOLN
3.0000 mL | Freq: Two times a day (BID) | INTRAMUSCULAR | Status: DC
  Administered 2014-06-22: 3 mL via INTRAVENOUS

## 2014-06-21 MED ORDER — ACETAMINOPHEN 325 MG PO TABS: 650.0000 mg | ORAL_TABLET | Freq: Four times a day (QID) | ORAL | Status: DC | PRN

## 2014-06-21 MED ORDER — SODIUM CHLORIDE 0.9 % IV SOLN
INTRAVENOUS | Status: DC
  Administered 2014-06-21: 19:00:00 via INTRAVENOUS
  Administered 2014-06-22: 100 mL/h via INTRAVENOUS

## 2014-06-21 MED ORDER — TRAMADOL HCL 50 MG PO TABS: 50.0000 mg | ORAL_TABLET | Freq: Four times a day (QID) | ORAL | Status: DC | PRN

## 2014-06-21 MED ORDER — PNEUMOCOCCAL VAC POLYVALENT 25 MCG/0.5ML IJ INJ
0.5000 mL | INJECTION | INTRAMUSCULAR | Status: DC
  Filled 2014-06-21: qty 0.5

## 2014-06-21 MED ORDER — ACETAMINOPHEN 650 MG RE SUPP: 650.0000 mg | Freq: Four times a day (QID) | RECTAL | Status: DC | PRN

## 2014-06-21 MED ORDER — HEPARIN SODIUM (PORCINE) 5000 UNIT/ML IJ SOLN
5000.0000 [IU] | Freq: Three times a day (TID) | INTRAMUSCULAR | Status: DC
  Administered 2014-06-21 – 2014-06-22 (×2): 5000 [IU] via SUBCUTANEOUS
  Filled 2014-06-21 (×5): qty 1

## 2014-06-21 MED ORDER — SODIUM CHLORIDE 0.9 % IV BOLUS (SEPSIS)
1000.0000 mL | Freq: Once | INTRAVENOUS | Status: AC
  Administered 2014-06-21: 1000 mL via INTRAVENOUS

## 2014-06-21 MED ORDER — ONDANSETRON HCL 4 MG/2ML IJ SOLN: 4.0000 mg | Freq: Four times a day (QID) | INTRAMUSCULAR | Status: DC | PRN

## 2014-06-21 MED ORDER — HYDRALAZINE HCL 20 MG/ML IJ SOLN: 10.0000 mg | INTRAMUSCULAR | Status: DC | PRN

## 2014-06-21 MED ORDER — MAGNESIUM SULFATE 2 GM/50ML IV SOLN
2.0000 g | Freq: Once | INTRAVENOUS | Status: AC
  Administered 2014-06-21: 2 g via INTRAVENOUS
  Filled 2014-06-21: qty 50

## 2014-06-21 NOTE — ED Notes (Signed)
Pt reports episodes of dizziness and feeling like her heart is racing for extended amount of time, last week had + syncopal episode.

## 2014-06-21 NOTE — Progress Notes (Signed)
NURSING PROGRESS NOTE  Mallory Murphy [id number] Admission Data: 06/21/2014 7:42 PM Attending Provider: Kelvin Cellar, MD QIH:[id number],DGLOVF D, MD Code Status: Full  Mallory Murphy is a 69 y.o. female patient admitted from ED:  -No acute distress noted.  -No complaints of shortness of breath.  -No complaints of chest pain.   Cardiac Monitoring: Box # 18 in place. Cardiac monitor yields:normal sinus rhythm.  Blood pressure 168/54, pulse 83, temperature 98.4 F (36.9 C), temperature source Oral, resp. rate 18, height 5\' 2"  (1.575 m), weight 59.33 kg (130 lb 12.8 oz), SpO2 99 %.   IV Fluids:  IV in place, occlusive dsg intact without redness, IV cath forearm left, condition patent and no redness normal saline.   Allergies:  Macrobid  Past Medical History:   has a past medical history of Hypertension and Arthritis.  Past Surgical History:   has past surgical history that includes Joint replacement.  Social History:   reports that she has never smoked. She does not have any smokeless tobacco history on file. She reports that she drinks alcohol. She reports that she does not use illicit drugs.  Skin: Intact  Patient/Family orientated to room. Information packet given to patient/family. Admission inpatient armband information verified with patient/family to include name and date of birth and placed on patient arm. Side rails up x 2, fall assessment and education completed with patient/family. Patient/family able to verbalize understanding of risk associated with falls and verbalized understanding to call for assistance before getting out of bed. Call light within reach. Patient/family able to voice and demonstrate understanding of unit orientation instructions.    Will continue to evaluate and treat per MD orders.

## 2014-06-21 NOTE — H&P (Signed)
[facility] History and Physical  Mallory Murphy XBM:[id number] DOB: [birth year] DOA: 06/21/2014  Referring physician:  PCP: Kandice Hams, MD   Chief Complaint: Syncope/dizziness  HPI: Mallory Murphy is a 69 y.o. female with a past medical history of hypertension, rheumatoid arthritis, referred to the emergent apartment by her primary care physician for further workup of syncope. Patient reports having a syncopal event last Wednesday that occurred while in her office. She reported feeling dizzy/lightheaded along with palpitations prior to event. At the time she did not seek medical attention. She reports having intermittent episodes of dizziness/lightheadedness over the past several weeks. He also complains of occasional palpitations. She denies chest pain, shortness of breath, fevers, chills, abdominal pain, diarrhea, bloody stools, hematemesis. Initial lab work in the emergency room showed an elevated creatinine of 1.9, increased from 1.0 on 05/17/2013. She was also found to be hypokalemic with potassium of 2.7. She is currently on hydrochlorothiazide and lisinopril.                                                                                Review of Systems:  Constitutional:  No weight loss, night sweats, Fevers, chills, fatigue. Positive for dizziness/lightheadedness  HEENT:  No headaches, Difficulty swallowing,Tooth/dental problems,Sore throat,  No sneezing, itching, ear ache, nasal congestion, post nasal drip,  Cardio-vascular:  No chest pain, Orthopnea, PND, swelling in lower extremities, anasarca, dizziness, positive for palpitations  GI:  No heartburn, indigestion, abdominal pain, nausea, vomiting, diarrhea, change in bowel habits, loss of appetite  Resp:  No shortness of breath with exertion or at rest. No excess mucus, no productive cough, No non-productive cough, No coughing up of blood.No change in color of mucus.No wheezing.No chest wall deformity  Skin:  no rash  or lesions.  GU:  no dysuria, change in color of urine, no urgency or frequency. No flank pain.  Musculoskeletal:  No joint pain or swelling. No decreased range of motion. No back pain.  Psych:  No change in mood or affect. No depression or anxiety. No memory loss.   Past Medical History  Diagnosis Date  . Hypertension   . Arthritis    Past Surgical History  Procedure Laterality Date  . Joint replacement     Social History:  reports that she has never smoked. She does not have any smokeless tobacco history on file. She reports that she drinks alcohol. She reports that she does not use illicit drugs.  Allergies  Allergen Reactions  . Macrobid [Nitrofurantoin Macrocrystal] Nausea Only    History reviewed. No pertinent family history.   Prior to Admission medications   Medication Sig Start Date End Date Taking? Authorizing Provider  escitalopram (LEXAPRO) 10 MG tablet Take 10 mg by mouth daily.   Yes Historical Provider, MD  etanercept (ENBREL) 50 MG/ML injection Inject 50 mg into the skin once a week.   Yes Historical Provider, MD  hydrochlorothiazide (MICROZIDE) 12.5 MG capsule Take 12.5 mg by mouth daily.   Yes Historical Provider, MD  lisinopril (PRINIVIL,ZESTRIL) 40 MG tablet Take 40 mg by mouth daily.   Yes Historical Provider, MD  tolterodine (DETROL) 2 MG tablet Take 2 mg by mouth daily.  Yes Historical Provider, MD  traMADol (ULTRAM) 50 MG tablet Take 50 mg by mouth every 8 (eight) hours as needed.   Yes Historical Provider, MD  ciprofloxacin (CIPRO) 500 MG tablet Take 1 tablet (500 mg total) by mouth 2 (two) times daily. Patient not taking: Reported on 06/21/2014 05/17/13   Veryl Speak, MD  HYDROcodone-acetaminophen (NORCO/VICODIN) 5-325 MG per tablet Take 1 tablet by mouth every 6 (six) hours as needed for pain. Patient not taking: Reported on 06/21/2014 05/20/12   Areta Haber Dunn, PA-C  ondansetron (ZOFRAN ODT) 8 MG disintegrating tablet 8mg  ODT q4 hours prn nausea Patient not  taking: Reported on 06/21/2014 05/17/13   Veryl Speak, MD   Physical Exam: Filed Vitals:   06/21/14 1515 06/21/14 1530 06/21/14 1600 06/21/14 1630  BP: 153/62 165/76 171/56 165/75  Pulse: 81 75 78 83  Temp:      TempSrc:      Resp: 14 12 13 14   Height:      Weight:      SpO2: 100% 100% 99% 99%    Wt Readings from Last 3 Encounters:  06/21/14 59.875 kg (132 lb)  05/17/13 58.968 kg (130 lb)  05/13/12 61.145 kg (134 lb 12.8 oz)    General:  Appears calm and comfortable, in no acute distress. Currently asymptomatic. Eyes: PERRL, normal lids, irises & conjunctiva ENT: grossly normal hearing, lips & tongue Neck: no LAD, masses or thyromegaly Cardiovascular: RRR, no m/r/g. No LE edema. Telemetry: SR, no arrhythmias  Respiratory: CTA bilaterally, no w/r/r. Normal respiratory effort. Abdomen: soft, ntnd Skin: no rash or induration seen on limited exam Musculoskeletal: grossly normal tone BUE/BLE Psychiatric: grossly normal mood and affect, speech fluent and appropriate Neurologic: grossly non-focal.          Labs on Admission:  Basic Metabolic Panel:  Recent Labs Lab 06/21/14 1209  NA 134*  K 2.7*  CL 95*  CO2 26  GLUCOSE 100*  BUN 22  CREATININE 1.96*  CALCIUM 8.6  MG 1.4*   Liver Function Tests: No results for input(s): AST, ALT, ALKPHOS, BILITOT, PROT, ALBUMIN in the last 168 hours. No results for input(s): LIPASE, AMYLASE in the last 168 hours. No results for input(s): AMMONIA in the last 168 hours. CBC:  Recent Labs Lab 06/21/14 1209  WBC 10.3  HGB 10.4*  HCT 31.0*  MCV 90.9  PLT 327   Cardiac Enzymes: No results for input(s): CKTOTAL, CKMB, CKMBINDEX, TROPONINI in the last 168 hours.  BNP (last 3 results)  Recent Labs  06/21/14 1209  BNP 103.7*    ProBNP (last 3 results) No results for input(s): PROBNP in the last 8760 hours.  CBG: No results for input(s): GLUCAP in the last 168 hours.  Radiological Exams on Admission: Dg Chest 2  View  06/21/2014   CLINICAL DATA:  Cardiac palpitations  EXAM: CHEST  2 VIEW  COMPARISON:  05/07/2012  FINDINGS: Cardiac shadow is within normal limits. The lungs are well aerated bilaterally. No focal infiltrate or sizable effusion is seen. Postsurgical changes are noted in the shoulders bilaterally. Mild interstitial changes are noted without focal infiltrate. No acute abnormality is seen.  IMPRESSION: No active cardiopulmonary disease.   Electronically Signed   By: Inez Catalina M.D.   On: 06/21/2014 13:00    EKG: Independently reviewed.   Assessment/Plan Principal Problem:   Syncope and collapse Active Problems:   HTN (hypertension)   AKI (acute kidney injury)   Dehydration   Hypokalemia   Palpitation  1. Syncope. Patient reporting having a syncopal episode last Wednesday, feeling dizzy/lightheaded prior to event. This could have been related to dehydration/volume depletion given patient's presenting lab work showing development of acute kidney injury and hypokalemia. She had been on diuretic therapy with hydrochlorothiazide. Will place her in overnight obs, cycle troponin, obtain a transthoracic echocardiogram in a.m. Place her on continuous cardiac monitoring repeat EKG in a.m. 2. Acute kidney injury. Initial lab work performed in emergency department showing creatinine 1.96, increased from 1.0 from labs obtained on 05/17/2013. Could be related to prerenal azotemia and ACE therapy. Will stop hydrochlorothiazide and ACE inhibitor therapy. Will provide IV fluid hydration overnight, repeat labs in a.m. 3. Hypertension. Given the presence of acute kidney injury will discontinue hydrochlorothiazide and lisinopril. Patient having elevated blood pressures in the emergency department with systolic blood pressures in the 160s to 170s. Will start metoprolol 25 mg by mouth twice a day, as needed hydralazine for systolic blood pressures greater than 165. Follow blood pressures overnight as  antihypertensive agents may need to be titrated. 4. Hypokalemia. Suspect hydrochlorothiazide may have contributed, stopping diuretic. Patient given oral potassium replacement in the emergency department, provide 3 runs of potassium chloride IV overnight, repeat BMP in a.m. 5. Palpitations. Will place patient on continuous cardiac monitoring and obtain transthoracic echocardiogram. EKG showed sinus rhythm. 6. DVT prophylaxis. Simultaneous heparin    Code Status: Full code Family Communication: Family not present Disposition Plan: Will place patient in overnight observation, do not assist her requiring greater than 2 nights hospitalization  Time spent: 50 minutes  Kelvin Cellar [facility] Pager [phone]

## 2014-06-21 NOTE — ED Notes (Signed)
Lab to add on Magnesium

## 2014-06-21 NOTE — ED Notes (Signed)
K of 2.7 reported from the lab

## 2014-06-21 NOTE — ED Provider Notes (Signed)
CSN: [id number]     Arrival date & time 06/21/14  1152 History   First MD Initiated Contact with Patient 06/21/14 1459     Chief Complaint  Patient presents with  . Palpitations  . Loss of Consciousness     (Consider location/radiation/quality/duration/timing/severity/associated sxs/prior Treatment) HPI Comments: 69 yo F hx of HTN, RA, presents with CC of palpitations, syncope.  Pt states she has been having palpitations over the last few years, but worse in the last week.  She has felt lightheaded this week, and had brief syncopal episode on Wednesday, falling on her tailbone.  Pt denies fever, chills, CP, SOB, cough, abdominal pain, nausea, vomiting, diarrhea, rash, myalgias, or any other symptoms.  No new medications changes.  She has been compliant with her chronic home medications.  Pt saw her PCP Dr. Delfina Redwood in office today, and he sent her to ED for further evaluation and management.  No other concerns.    The history is provided by the patient. No language interpreter was used.    Past Medical History  Diagnosis Date  . Hypertension   . Arthritis    Past Surgical History  Procedure Laterality Date  . Joint replacement     History reviewed. No pertinent family history. History  Substance Use Topics  . Smoking status: Never Smoker   . Smokeless tobacco: Not on file  . Alcohol Use: Yes   OB History    No data available     Review of Systems  Constitutional: Negative for fever and chills.  Respiratory: Negative for cough and shortness of breath.   Cardiovascular: Positive for palpitations. Negative for chest pain and leg swelling.  Gastrointestinal: Negative for nausea, vomiting, abdominal pain, diarrhea and constipation.  Genitourinary: Negative for dysuria.  Musculoskeletal: Negative for myalgias.  Skin: Negative for rash.  Neurological: Positive for light-headedness. Negative for dizziness, weakness, numbness and headaches.  Hematological: Negative for adenopathy.  Does not bruise/bleed easily.  All other systems reviewed and are negative.     Allergies  Macrobid  Home Medications   Prior to Admission medications   Medication Sig Start Date End Date Taking? Authorizing Provider  ciprofloxacin (CIPRO) 500 MG tablet Take 1 tablet (500 mg total) by mouth 2 (two) times daily. 05/17/13   Veryl Speak, MD  escitalopram (LEXAPRO) 10 MG tablet Take 10 mg by mouth daily.    Historical Provider, MD  hydrochlorothiazide (MICROZIDE) 12.5 MG capsule Take 12.5 mg by mouth daily.    Historical Provider, MD  HYDROcodone-acetaminophen (NORCO/VICODIN) 5-325 MG per tablet Take 1 tablet by mouth every 6 (six) hours as needed for pain. 05/20/12   Areta Haber Dunn, PA-C  lisinopril (PRINIVIL,ZESTRIL) 40 MG tablet Take 40 mg by mouth daily.    Historical Provider, MD  meloxicam (MOBIC) 15 MG tablet Take 15 mg by mouth daily.    Historical Provider, MD  ondansetron (ZOFRAN ODT) 8 MG disintegrating tablet 8mg  ODT q4 hours prn nausea 05/17/13   Veryl Speak, MD  tolterodine (DETROL) 2 MG tablet Take 2 mg by mouth daily.    Historical Provider, MD  traMADol (ULTRAM) 50 MG tablet Take 50 mg by mouth every 8 (eight) hours as needed.    Historical Provider, MD   BP 125/55 mmHg  Pulse 80  Temp(Src) 98.1 F (36.7 C) (Oral)  Resp 14  Ht 5\' 2"  (1.575 m)  Wt 132 lb (59.875 kg)  BMI 24.14 kg/m2  SpO2 99% Physical Exam  Constitutional: She is oriented to person, place,  and time. She appears well-developed and well-nourished.  HENT:  Head: Normocephalic and atraumatic.  Right Ear: External ear normal.  Left Ear: External ear normal.  Nose: Nose normal.  Mouth/Throat: Oropharynx is clear and moist.  Eyes: Conjunctivae and EOM are normal. Pupils are equal, round, and reactive to light.  Neck: Normal range of motion. Neck supple.  Cardiovascular: Normal rate, regular rhythm, normal heart sounds and intact distal pulses.   Pulmonary/Chest: Effort normal and breath sounds normal. No  respiratory distress. She has no wheezes. She has no rales. She exhibits no tenderness.  Abdominal: Soft. Bowel sounds are normal. She exhibits no distension and no mass. There is no tenderness. There is no rebound and no guarding.  Musculoskeletal: Normal range of motion.  Neurological: She is alert and oriented to person, place, and time.  CN II-XII grossly intact.  4/5 strength in RUE (pt states is baseline), 5/5 strength in all other extremities.  No sensory deficits on exam.    Skin: Skin is warm and dry.  Nursing note and vitals reviewed.   ED Course  Procedures (including critical care time) Labs Review Labs Reviewed  CBC - Abnormal; Notable for the following:    RBC 3.41 (*)    Hemoglobin 10.4 (*)    HCT 31.0 (*)    All other components within normal limits  BASIC METABOLIC PANEL - Abnormal; Notable for the following:    Sodium 134 (*)    Potassium 2.7 (*)    Chloride 95 (*)    Glucose, Bld 100 (*)    Creatinine, Ser 1.96 (*)    GFR calc non Af Amer 25 (*)    GFR calc Af Amer 29 (*)    All other components within normal limits  BRAIN NATRIURETIC PEPTIDE - Abnormal; Notable for the following:    B Natriuretic Peptide 103.7 (*)    All other components within normal limits  MAGNESIUM - Abnormal; Notable for the following:    Magnesium 1.4 (*)    All other components within normal limits  I-STAT TROPOININ, ED    Imaging Review Dg Chest 2 View  06/21/2014   CLINICAL DATA:  Cardiac palpitations  EXAM: CHEST  2 VIEW  COMPARISON:  05/07/2012  FINDINGS: Cardiac shadow is within normal limits. The lungs are well aerated bilaterally. No focal infiltrate or sizable effusion is seen. Postsurgical changes are noted in the shoulders bilaterally. Mild interstitial changes are noted without focal infiltrate. No acute abnormality is seen.  IMPRESSION: No active cardiopulmonary disease.   Electronically Signed   By: Inez Catalina M.D.   On: 06/21/2014 13:00     EKG  Interpretation   Date/Time:  Wednesday June 21 2014 11:54:18 EST Ventricular Rate:  94 PR Interval:  146 QRS Duration: 84 QT Interval:  356 QTC Calculation: 445 R Axis:   64 Text Interpretation:  Normal sinus rhythm Nonspecific ST and T wave  abnormality Abnormal ECG Sinus rhythm ST-t wave abnormality Abnormal ekg  Confirmed by Carmin Muskrat  MD ([id number]) on 06/21/2014 3:36:44 PM      MDM   Final diagnoses:  AKI (acute kidney injury)  Dehydration  Hypokalemia  Hypomagnesemia  Syncope and collapse  Arthritis, rheumatoid   69 yo F hx of HTN, RA, presents with CC of palpitations, syncope.   Physical exam as above.  VS WNL.  Pt currently asymptomatic.   EKG with nonspecific ST and T wave changes.  CXR WNL.  Troponin WNL.  CBC demonstrates stable normocytic anemia.  BMP with hypokalemia 2.7, and elevated Cr to 1.96 (baseline 1.00), BNP 103, Mag 1.4.  Pt given K replacement, Mag replacement, NS bolus.   Medicine consulted for admission for syncope, AKI, hypoK, hypoMag.  Pt understands and agrees with plan.   Discussed pt with my attending Dr. Vanita Panda.   Sinda Du     Sinda Du, MD 06/22/14 [id number]  Carmin Muskrat, MD 06/23/14 [phone]

## 2014-06-22 DIAGNOSIS — N179 Acute kidney failure, unspecified: Secondary | ICD-10-CM | POA: Diagnosis not present

## 2014-06-22 DIAGNOSIS — E876 Hypokalemia: Secondary | ICD-10-CM | POA: Diagnosis not present

## 2014-06-22 DIAGNOSIS — I10 Essential (primary) hypertension: Secondary | ICD-10-CM | POA: Diagnosis not present

## 2014-06-22 DIAGNOSIS — R55 Syncope and collapse: Secondary | ICD-10-CM | POA: Diagnosis not present

## 2014-06-22 DIAGNOSIS — E860 Dehydration: Secondary | ICD-10-CM | POA: Diagnosis not present

## 2014-06-22 DIAGNOSIS — R0600 Dyspnea, unspecified: Secondary | ICD-10-CM

## 2014-06-22 LAB — BASIC METABOLIC PANEL
ANION GAP: 8 (ref 5–15)
BUN: 16 mg/dL (ref 6–23)
CALCIUM: 8.2 mg/dL — AB (ref 8.4–10.5)
CHLORIDE: 107 mmol/L (ref 96–112)
CO2: 23 mmol/L (ref 19–32)
Creatinine, Ser: 1.11 mg/dL — ABNORMAL HIGH (ref 0.50–1.10)
GFR calc Af Amer: 58 mL/min — ABNORMAL LOW (ref >90)
GFR, EST NON AFRICAN AMERICAN: 50 mL/min — AB (ref >90)
Glucose, Bld: 108 mg/dL — ABNORMAL HIGH (ref 70–99)
Potassium: 3.7 mmol/L (ref 3.5–5.1)
Sodium: 138 mmol/L (ref 135–145)

## 2014-06-22 LAB — CBC
HCT: 27.6 % — ABNORMAL LOW (ref 36.0–46.0)
Hemoglobin: 9.2 g/dL — ABNORMAL LOW (ref 12.0–15.0)
MCH: 30.9 pg (ref 26.0–34.0)
MCHC: 33.3 g/dL (ref 30.0–36.0)
MCV: 92.6 fL (ref 78.0–100.0)
PLATELETS: 295 10*3/uL (ref 150–400)
RBC: 2.98 MIL/uL — ABNORMAL LOW (ref 3.87–5.11)
RDW: 15.5 % (ref 11.5–15.5)
WBC: 11.3 10*3/uL — ABNORMAL HIGH (ref 4.0–10.5)

## 2014-06-22 LAB — TROPONIN I: Troponin I: <0.03 ng/mL (ref <0.031)

## 2014-06-22 MED ORDER — METOPROLOL TARTRATE 25 MG PO TABS: 25.0000 mg | ORAL_TABLET | Freq: Two times a day (BID) | ORAL | Status: DC

## 2014-06-22 NOTE — Care Management Note (Signed)
    Page 1 of 1   06/22/2014     12:29:55 PM CARE MANAGEMENT NOTE 06/22/2014  Patient:  Mallory Murphy, Mallory Murphy   Account Number:  [id number]  Date Initiated:  06/22/2014  Documentation initiated by:  Tomi Bamberger  Subjective/Objective Assessment:   dx syncope and collapse  admit as observation- lives with spouse.     Action/Plan:   pt eval- no f/u   Anticipated DC Date:  06/15/2014   Anticipated DC Plan:        [place]  CM consult      Choice offered to / List presented to:             Status of service:  Completed, signed off Medicare Important Message given?  NA - LOS <3 / Initial given by admissions (If response is "NO", the following Medicare IM given date fields will be blank) Date Medicare IM given:   Medicare IM given by:   Date Additional Medicare IM given:   Additional Medicare IM given by:    Discharge Disposition:  HOME/SELF CARE  Per UR Regulation:  Reviewed for med. necessity/level of care/duration of stay  If discussed at [place] of Stay Meetings, dates discussed:    Comments:  06/22/14 [place] [phone] patient for dc today, no needs.

## 2014-06-22 NOTE — Evaluation (Signed)
Physical Therapy Evaluation Patient Details Name: LEALA BRYAND MRN: [id number] DOB: [birth year] Today's Date: 06/22/2014   History of Present Illness  Mallory Murphy is a 69 y.o. female with a past medical history of hypertension, rheumatoid arthritis, referred to the emergent apartment by her primary care physician for further workup of syncope. Patient reports having a syncopal event last Wednesday that occurred while in her office. She reported feeling dizzy/lightheaded along with palpitations prior to event. At the time she did not seek medical attention. She reports having intermittent episodes of dizziness/lightheadedness over the past several weeks. He also complains of occasional palpitations. She denies chest pain, shortness of breath, fevers, chills, abdominal pain, diarrhea, bloody stools, hematemesis. Initial lab work in the emergency room showed an elevated creatinine of 1.9, increased from 1.0 on 05/17/2013. She was also found to be hypokalemic with potassium of 2.7. She is currently on hydrochlorothiazide and lisinopril.  Clinical Impression  Pt is at baseline functioning.  No further PT needs.  Will sign off.    Follow Up Recommendations No PT follow up    Equipment Recommendations  None recommended by PT    Recommendations for Other Services       Precautions / Restrictions Precautions Precautions: None Restrictions Weight Bearing Restrictions: No      Mobility  Bed Mobility Overal bed mobility: Independent                Transfers Overall transfer level: Independent                  Ambulation/Gait Ambulation/Gait assistance: Independent Ambulation Distance (Feet): 400 Feet Assistive device: None Gait Pattern/deviations: WFL(Within Functional Limits) Gait velocity: WFL Gait velocity interpretation: >2.62 ft/sec, indicative of independent community ambulator General Gait Details: fluid gait  Stairs Stairs: Yes Stairs assistance: Modified  independent (Device/Increase time) Stair Management: One rail Right;Alternating pattern;Forwards Number of Stairs: 3 General stair comments: safe with rail  Wheelchair Mobility    Modified Rankin (Stroke Patients Only)       Balance Overall balance assessment: No apparent balance deficits (not formally assessed)                               Standardized Balance Assessment Standardized Balance Assessment : Dynamic Gait Index   Dynamic Gait Index Level Surface: Normal Change in Gait Speed: Normal Gait with Horizontal Head Turns: Normal Gait with Vertical Head Turns: Normal Gait and Pivot Turn: Normal Step Over Obstacle: Normal Step Around Obstacles: Normal Steps: Mild Impairment Total Score: 23       Pertinent Vitals/Pain Pain Assessment: No/denies pain    Home Living Family/patient expects to be discharged to:: Private residence Living Arrangements: Alone Available Help at Discharge: Available PRN/intermittently (niece) Type of Home: House Home Access: Stairs to enter Entrance Stairs-Rails: Psychiatric nurse of Steps: 3 Home Layout: One level Home Equipment: None      Prior Function Level of Independence: Independent               Hand Dominance        Extremity/Trunk Assessment   Upper Extremity Assessment:  (R UE limitations due to past injuries and RA; functional)           Lower Extremity Assessment: Overall WFL for tasks assessed      Cervical / Trunk Assessment: Normal  Communication   Communication: No difficulties  Cognition Arousal/Alertness: Awake/alert Behavior During Therapy: WFL for tasks assessed/performed  Overall Cognitive Status: Within Functional Limits for tasks assessed                      General Comments      Exercises        Assessment/Plan    PT Assessment Patent does not need any further PT services  PT Diagnosis     PT Problem List    PT Treatment Interventions      PT Goals (Current goals can be found in the Care Plan section) Acute Rehab PT Goals PT Goal Formulation: All assessment and education complete, DC therapy    Frequency     Barriers to discharge        Co-evaluation               End of Session   Activity Tolerance: Patient tolerated treatment well Patient left: with call bell/phone within reach (sitting EOB) Nurse Communication: Mobility status         Time: 0950-1007 PT Time Calculation (min) (ACUTE ONLY): 17 min   Charges:   PT Evaluation $Initial PT Evaluation Tier I: 1 Procedure     PT G Codes:        Cortlan Dolin, Tessie Fass 06/22/2014, 10:15 AM 06/22/2014  Donnella Sham, [place] [phone]  (pager)

## 2014-06-22 NOTE — Discharge Summary (Addendum)
PATIENT DETAILS Name: Mallory Murphy Age: 69 y.o. Sex: female Date of Birth: October 30, 1945 MRN: [id number]. Admitting Physician: Kelvin Cellar, MD NFA:OZHYQM,VHQION D, MD  Admit Date: 06/21/2014 Discharge date: 06/22/2014  Recommendations for Outpatient Follow-up:  1. Please check CBC and chemistry at next visit 2. Note: HCTZ and lisinopril has been discontinued due to ARF,hypokalemia and orthostatic hypertension. Now on metoprolol.  PRIMARY DISCHARGE DIAGNOSIS:  Principal Problem:   Syncope and collapse Active Problems:   HTN (hypertension)   AKI (acute kidney injury)   Dehydration   Hypokalemia   Palpitation      PAST MEDICAL HISTORY: Past Medical History  Diagnosis Date  . Hypertension   . Arthritis     DISCHARGE MEDICATIONS: Current Discharge Medication List    START taking these medications   Details  metoprolol tartrate (LOPRESSOR) 25 MG tablet Take 1 tablet (25 mg total) by mouth 2 (two) times daily. Qty: 60 tablet, Refills: 0      CONTINUE these medications which have NOT CHANGED   Details  escitalopram (LEXAPRO) 10 MG tablet Take 10 mg by mouth daily.    etanercept (ENBREL) 50 MG/ML injection Inject 50 mg into the skin once a week.    tolterodine (DETROL) 2 MG tablet Take 2 mg by mouth daily.    traMADol (ULTRAM) 50 MG tablet Take 50 mg by mouth every 8 (eight) hours as needed.    HYDROcodone-acetaminophen (NORCO/VICODIN) 5-325 MG per tablet Take 1 tablet by mouth every 6 (six) hours as needed for pain. Qty: 15 tablet, Refills: 0    ondansetron (ZOFRAN ODT) 8 MG disintegrating tablet 8mg  ODT q4 hours prn nausea Qty: 6 tablet, Refills: 0      STOP taking these medications     hydrochlorothiazide (MICROZIDE) 12.5 MG capsule      lisinopril (PRINIVIL,ZESTRIL) 40 MG tablet      ciprofloxacin (CIPRO) 500 MG tablet         ALLERGIES:   Allergies  Allergen Reactions  . Macrobid [Nitrofurantoin Macrocrystal] Nausea Only    BRIEF HPI:  See  H&P, Labs, Consult and Test reports for all details in brief, patient was admitted for evaluation of a syncopal episode that was sustained 1 week prior to this admission. Patient was also complaining of intermittent dizziness especially upon standing up from a combined position.  CONSULTATIONS:   None  PERTINENT RADIOLOGIC STUDIES: Dg Chest 2 View  06/21/2014   CLINICAL DATA:  Cardiac palpitations  EXAM: CHEST  2 VIEW  COMPARISON:  05/07/2012  FINDINGS: Cardiac shadow is within normal limits. The lungs are well aerated bilaterally. No focal infiltrate or sizable effusion is seen. Postsurgical changes are noted in the shoulders bilaterally. Mild interstitial changes are noted without focal infiltrate. No acute abnormality is seen.  IMPRESSION: No active cardiopulmonary disease.   Electronically Signed   By: Inez Catalina M.D.   On: 06/21/2014 13:00     PERTINENT LAB RESULTS: CBC:  Recent Labs  06/21/14 1209 06/22/14 0746  WBC 10.3 11.3*  HGB 10.4* 9.2*  HCT 31.0* 27.6*  PLT 327 295   CMET CMP     Component Value Date/Time   NA 138 06/22/2014 0746   K 3.7 06/22/2014 0746   CL 107 06/22/2014 0746   CO2 23 06/22/2014 0746   GLUCOSE 108* 06/22/2014 0746   BUN 16 06/22/2014 0746   CREATININE 1.11* 06/22/2014 0746   CREATININE 1.19* 05/07/2012 1553   CALCIUM 8.2* 06/22/2014 0746   PROT 8.3 05/17/2013 1100  ALBUMIN 4.1 05/17/2013 1100   AST 22 05/17/2013 1100   ALT 9 05/17/2013 1100   ALKPHOS 69 05/17/2013 1100   BILITOT 0.8 05/17/2013 1100   GFRNONAA 50* 06/22/2014 0746   GFRAA 58* 06/22/2014 0746    GFR Estimated Creatinine Clearance: 38.4 mL/min (by C-G formula based on Cr of 1.11). No results for input(s): LIPASE, AMYLASE in the last 72 hours.  Recent Labs  06/21/14 1845 06/21/14 2257 06/22/14 0746  TROPONINI <0.03 <0.03 <0.03   Invalid input(s): POCBNP No results for input(s): DDIMER in the last 72 hours. No results for input(s): HGBA1C in the last 72 hours. No  results for input(s): CHOL, HDL, LDLCALC, TRIG, CHOLHDL, LDLDIRECT in the last 72 hours. No results for input(s): TSH, T4TOTAL, T3FREE, THYROIDAB in the last 72 hours.  Invalid input(s): FREET3 No results for input(s): VITAMINB12, FOLATE, FERRITIN, TIBC, IRON, RETICCTPCT in the last 72 hours. Coags: No results for input(s): INR in the last 72 hours.  Invalid input(s): PT Microbiology: No results found for this or any previous visit (from the past 240 hour(s)).   BRIEF HOSPITAL COURSE:  Syncope and collapse: Patient was admitted for evaluation of a syncopal episode that occurred one week prior to admission. Patient claims that after standing up from sitting position, she felt dizzy and subsequently sustained the syncopal episode. She claims to have intermittent dizzy spells over the past few weeks-predominantly after she stands up from sitting/lying position. Given that she was hypokalemic, and in acute renal failure (on HCTZ)-highly likely that syncope is secondary to orthostatic hypotension. Telemetry is negative for major arrhythmias. She was admitted in hydrated overnight, hypokalemia and acute renal failure have essentially resolved. She was ambulated by physical therapy without any major shoes. Orthostatic vitals done this morning after overnight hydration, slightly are positive. Will plan on discontinuing HCTZ on discharge. Echocardiogram showed preserved systolic function, with grade 2 diastolic dysfunction and mild pulmonary hypertension. We will defer further optimization and workup to the outpatient setting. Patient was instructed not to drive by this M.D. till seen by her primary care practitioner in a week or so.  Active Problems:  HTN (hypertension): Due to hypokalemia and acute renal failure-HCTZ/lisinopril on admission. The pressure moderately controlled with metoprolol, will continue on discharge.Further optimization to be done in the outpatient setting   AKI (acute kidney  injury): Likely secondary to HCTZ. Hydrated, creatinine significantly improved. Suggest repeat creatinine as an outpatient when she follows up with PCP   Dehydration: Resolved with hydration. Likely secondary to HCTZ.   Hypokalemia: Likely secondary to HCTZ.   TODAY-DAY OF DISCHARGE:  Subjective:   Isabella Roemmich today has no headache,no chest abdominal pain,no new weakness tingling or numbness, feels much better wants to go home today.   Objective:   Blood pressure 152/61, pulse 81, temperature 99 F (37.2 C), temperature source Oral, resp. rate 18, height 5\' 2"  (1.575 m), weight 59.33 kg (130 lb 12.8 oz), SpO2 96 %.  Intake/Output Summary (Last 24 hours) at 06/22/14 1541 Last data filed at 06/22/14 1400  Gross per 24 hour  Intake 1633.33 ml  Output      0 ml  Net 1633.33 ml   Filed Weights   06/21/14 1200 06/21/14 1732  Weight: 59.875 kg (132 lb) 59.33 kg (130 lb 12.8 oz)    Exam Awake Alert, Oriented *3, No new F.N deficits, Normal affect [place].AT,PERRAL Supple Neck,No JVD, No cervical lymphadenopathy appriciated.  Symmetrical Chest wall movement, Good air movement bilaterally, CTAB RRR,No Gallops,Rubs or  new Murmurs, No Parasternal Heave +ve B.Sounds, Abd Soft, Non tender, No organomegaly appriciated, No rebound -guarding or rigidity. No Cyanosis, Clubbing or edema, No new Rash or bruise  DISCHARGE CONDITION: Stable  DISPOSITION: Home  DISCHARGE INSTRUCTIONS:    Activity:  As tolerated   Diet recommendation: Heart Healthy diet  Discharge Instructions    Call MD for:  persistant dizziness or light-headedness    Complete by:  As directed      Diet - low sodium heart healthy    Complete by:  As directed      Increase activity slowly    Complete by:  As directed            Follow-up Information    Follow up with Kandice Hams, MD On 06/29/2014.   Specialty:  Internal Medicine   Why:  Appointment with Dr. Delfina Redwood is on 06/29/14 at 1pm   Contact  information:   [address] [place] [address] [phone]       Total Time spent on discharge equals 45 minutes.  SignedOren Binet 06/22/2014 3:41 PM

## 2014-06-22 NOTE — Progress Notes (Signed)
PATIENT DETAILS Name: Mallory Murphy Age: 69 y.o. Sex: female Date of Birth: [birth year] Admit Date: 06/21/2014 Admitting Physician Kelvin Cellar, MD LSL:HTDSKA,JGOTLX D, MD  Subjective: No major complaints.  Assessment/Plan: Principal Problem:   Syncope and collapse: Patient was admitted for evaluation of a syncopal episode that occurred one week prior to admission. Patient claims that after standing up from sitting position, she felt dizzy and subsequently sustained the syncopal episode. She claims to have intermittent dizzy spells over the past few weeks-predominantly after she stands up from sitting/lying position. Given that she was hypokalemic, and in acute renal failure (on HCTZ)-highly likely that syncope is secondary to orthostatic hypotension. Telemetry is negative for major arrhythmias. She was admitted in hydrated overnight, hypokalemia and acute renal failure have essentially resolved. She was ambulated by physical therapy without any major shoes. Orthostatic vitals done this morning after overnight hydration, slightly are positive. Will plan on discontinuing HCTZ on discharge. Await echocardiogram, if no major abnormalities, suspect she can be discharged later today.  Active Problems:   HTN (hypertension): Due to hypokalemia and acute renal failure-HCTZ/lisinopril on admission. The pressure moderately controlled with metoprolol, will continue on discharge.Further optimization to be done in the outpatient setting    AKI (acute kidney injury): Likely secondary to HCTZ. Hydrated, creatinine significantly improved. Suggest repeat creatinine as an outpatient when she follows up with PCP    Dehydration: Resolved with hydration. Likely secondary to HCTZ.    Hypokalemia: Likely secondary to HCTZ.  Disposition: Remain inpatient-home later today if echocardiogram negative for major abnormalities  Antibiotics:  None   Anti-infectives    None      DVT  Prophylaxis: Prophylactic Heparin   Code Status: Full code  Family Communication None at bedside  Procedures:  None  CONSULTS:  None  Time spent 40 minutes-which includes 50% of the time with face-to-face with patient/ family and coordinating care related to the above assessment and plan.  MEDICATIONS: Scheduled Meds: . escitalopram  10 mg Oral Daily  . heparin  5,000 Units Subcutaneous 3 times per day  . Influenza vac split quadrivalent PF  0.5 mL Intramuscular Tomorrow-1000  . metoprolol tartrate  25 mg Oral BID  . pneumococcal 23 valent vaccine  0.5 mL Intramuscular Tomorrow-1000  . sodium chloride  3 mL Intravenous Q12H   Continuous Infusions:  PRN Meds:.acetaminophen **OR** acetaminophen, hydrALAZINE, ondansetron **OR** ondansetron (ZOFRAN) IV, traMADol    PHYSICAL EXAM: Vital signs in last 24 hours: Filed Vitals:   06/21/14 1732 06/21/14 2109 06/22/14 0540 06/22/14 1101  BP: 168/54 152/56 140/61 113/89  Pulse:  85 81 84  Temp: 98.4 F (36.9 C) 98.4 F (36.9 C) 99.7 F (37.6 C)   TempSrc: Oral Oral Oral   Resp: 18 16 16    Height:      Weight:      SpO2:  100% 96% 95%    Weight change:  Filed Weights   06/21/14 1200 06/21/14 1732  Weight: 59.875 kg (132 lb) 59.33 kg (130 lb 12.8 oz)   Body mass index is 23.92 kg/(m^2).   Gen Exam: Awake and alert with clear speech.   Neck: Supple, No JVD.   Chest: B/L Clear.   CVS: S1 S2 Regular, no murmurs.  Abdomen: soft, BS +, non tender, non distended.  Extremities: no edema, lower extremities warm to touch. Neurologic: Non Focal.   Skin: No Rash.   Wounds: N/A.    Intake/Output from previous day:  Intake/Output  Summary (Last 24 hours) at 06/22/14 1306 Last data filed at 06/22/14 1003  Gross per 24 hour  Intake 1393.33 ml  Output      0 ml  Net 1393.33 ml     LAB RESULTS: CBC  Recent Labs Lab 06/21/14 1209 06/22/14 0746  WBC 10.3 11.3*  HGB 10.4* 9.2*  HCT 31.0* 27.6*  PLT 327 295  MCV  90.9 92.6  MCH 30.5 30.9  MCHC 33.5 33.3  RDW 15.2 15.5    Chemistries   Recent Labs Lab 06/21/14 1209 06/22/14 0746  NA 134* 138  K 2.7* 3.7  CL 95* 107  CO2 26 23  GLUCOSE 100* 108*  BUN 22 16  CREATININE 1.96* 1.11*  CALCIUM 8.6 8.2*  MG 1.4*  --     CBG: No results for input(s): GLUCAP in the last 168 hours.  GFR Estimated Creatinine Clearance: 38.4 mL/min (by C-G formula based on Cr of 1.11).  Coagulation profile No results for input(s): INR, PROTIME in the last 168 hours.  Cardiac Enzymes  Recent Labs Lab 06/21/14 1845 06/21/14 2257 06/22/14 0746  TROPONINI <0.03 <0.03 <0.03    Invalid input(s): POCBNP No results for input(s): DDIMER in the last 72 hours. No results for input(s): HGBA1C in the last 72 hours. No results for input(s): CHOL, HDL, LDLCALC, TRIG, CHOLHDL, LDLDIRECT in the last 72 hours. No results for input(s): TSH, T4TOTAL, T3FREE, THYROIDAB in the last 72 hours.  Invalid input(s): FREET3 No results for input(s): VITAMINB12, FOLATE, FERRITIN, TIBC, IRON, RETICCTPCT in the last 72 hours. No results for input(s): LIPASE, AMYLASE in the last 72 hours.  Urine Studies No results for input(s): UHGB, CRYS in the last 72 hours.  Invalid input(s): UACOL, UAPR, USPG, UPH, UTP, UGL, UKET, UBIL, UNIT, UROB, ULEU, UEPI, UWBC, URBC, UBAC, CAST, UCOM, BILUA  MICROBIOLOGY: No results found for this or any previous visit (from the past 240 hour(s)).  RADIOLOGY STUDIES/RESULTS: Dg Chest 2 View  06/21/2014   CLINICAL DATA:  Cardiac palpitations  EXAM: CHEST  2 VIEW  COMPARISON:  05/07/2012  FINDINGS: Cardiac shadow is within normal limits. The lungs are well aerated bilaterally. No focal infiltrate or sizable effusion is seen. Postsurgical changes are noted in the shoulders bilaterally. Mild interstitial changes are noted without focal infiltrate. No acute abnormality is seen.  IMPRESSION: No active cardiopulmonary disease.   Electronically Signed   By:  Inez Catalina M.D.   On: 06/21/2014 13:00    Oren Binet, MD  [facility] Pager:[phone]  If 7PM-7AM, please contact night-coverage [email or web address] Password [email or web address] 06/22/2014, 1:06 PM

## 2014-06-22 NOTE — Progress Notes (Signed)
Pt discharged via wheelchair. VSS. Pt has no c/o pain, no discomfort and no signs of respiratory distress. Education and care plans completed. Pt IV removed and tolerated well with no complications. Leanne Chang, RN

## 2014-06-22 NOTE — Progress Notes (Signed)
UR completed 

## 2014-06-22 NOTE — Progress Notes (Signed)
  Echocardiogram 2D Echocardiogram has been performed.  Yoselin, [place] 06/22/2014, 12:54 PM

## 2014-06-28 NOTE — Progress Notes (Signed)
Late Entry Addendum to Initial Evaluation    06/22/14 1009  PT Time Calculation  PT Start Time (ACUTE ONLY) 0950  PT Stop Time (ACUTE ONLY) 1007  PT Time Calculation (min) (ACUTE ONLY) 17 min  PT G-Codes **NOT FOR INPATIENT CLASS**  Functional Assessment Tool Used clinical judgement  Functional Limitation Mobility: Walking and moving around  Mobility: Walking and Moving Around Current Status (P2330) [facility]  Mobility: Walking and Moving Around Goal Status (Q7622) [facility]  Mobility: Walking and Moving Around Discharge Status (Q3335) [facility]  PT General Charges  $$ ACUTE PT VISIT 1 Procedure  PT Evaluation  $Initial PT Evaluation Tier I 1 Procedure    06/28/2014  Donnella Sham, PT [phone]  (pager)

## 2015-08-20 ENCOUNTER — Other Ambulatory Visit: Payer: Self-pay

## 2015-08-20 DIAGNOSIS — Z1231 Encounter for screening mammogram for malignant neoplasm of breast: Secondary | ICD-10-CM

## 2015-09-03 ENCOUNTER — Ambulatory Visit
Admission: RE | Admit: 2015-09-03 | Discharge: 2015-09-03 | Disposition: A | Discharge status: Home / Self Care | Payer: Medicare Other | Source: Ambulatory Visit

## 2015-09-03 DIAGNOSIS — Z1231 Encounter for screening mammogram for malignant neoplasm of breast: Secondary | ICD-10-CM

## 2015-09-04 ENCOUNTER — Other Ambulatory Visit: Payer: Self-pay | Admitting: Obstetrics and Gynecology

## 2015-09-04 ENCOUNTER — Other Ambulatory Visit (HOSPITAL_COMMUNITY)
Admission: RE | Admit: 2015-09-04 | Discharge: 2015-09-04 | Disposition: A | Discharge status: Home / Self Care | Payer: Medicare Other | Source: Ambulatory Visit | Attending: Obstetrics and Gynecology | Admitting: Obstetrics and Gynecology

## 2015-09-04 DIAGNOSIS — Z01411 Encounter for gynecological examination (general) (routine) with abnormal findings: Secondary | ICD-10-CM | POA: Insufficient documentation

## 2015-09-06 LAB — CYTOLOGY - PAP

## 2015-09-18 ENCOUNTER — Other Ambulatory Visit: Payer: Self-pay | Admitting: Rheumatology

## 2015-09-18 DIAGNOSIS — M25461 Effusion, right knee: Secondary | ICD-10-CM

## 2015-09-18 DIAGNOSIS — M25561 Pain in right knee: Secondary | ICD-10-CM

## 2015-09-26 ENCOUNTER — Ambulatory Visit
Admission: RE | Admit: 2015-09-26 | Discharge: 2015-09-26 | Disposition: A | Discharge status: Home / Self Care | Payer: Medicare Other | Source: Ambulatory Visit | Attending: Rheumatology | Admitting: Rheumatology

## 2015-09-26 DIAGNOSIS — M25561 Pain in right knee: Secondary | ICD-10-CM

## 2015-09-26 DIAGNOSIS — M25461 Effusion, right knee: Secondary | ICD-10-CM

## 2016-04-30 NOTE — Progress Notes (Signed)
Scheduling pre op-  Please place SURGICAL ORDERS IN EPIC  thanks

## 2016-05-01 NOTE — Progress Notes (Signed)
Please place orders in EPIC as patient is being scheduled for a Pre-op appointment for surgery! Thank you!

## 2016-05-02 ENCOUNTER — Ambulatory Visit: Payer: Self-pay | Admitting: Orthopedic Surgery

## 2016-05-02 NOTE — H&P (Signed)
Mallory Murphy DOB: 1945-12-22 Divorced / Language: English / Race: White Female Date of Admission:  05/26/2016 CC:  Right Knee Pain History of Present Illness The patient is a 71 year old female who comes in for a preoperative History and Physical. The patient is scheduled for a right total knee arthroplasty to be performed by Dr. [name]. [name], MD at [facility] on 05-26-2016. The patient is a 71 year old female who presented for follow up of their knee. The patient is being followed for their right knee pain and osteoarthritis. They are now months out from Synvisc series a recent cortisone injection. Symptoms reported include: pain. The patient feels that they are doing poorly and report their pain level to be moderate. The patient has reported symptom improvement with: Cortisone injections (helps for a few weeks) while they have not gotten any relief of their symptoms with: viscosupplementation. The patient was seen in referral from Dr.[name] for a TKA evaluation. She has rheumatoid arthritis. She has had an elbow replacement, shoulder replacement, and hip replacement before. She said the right knee began hurting her about 6 months ago. It has gotten progressively worse. She has had cortisone and viscosupplement injections from Dr. [name], none of which are currently helping. She did have a recent cortisone injection about 4 weeks ago that provided slight benefit. Knee is hurting her with all activities. It is even starting to hurt at rest now. It has given out on her at times also. She does not have any problems with the left knee. It is felt that she would be a good candidate for knee replacement. They have been treated conservatively in the past for the above stated problem and despite conservative measures, they continue to have progressive pain and severe functional limitations and dysfunction. They have failed non-operative management including home exercise, medications, and  injections. It is felt that they would benefit from undergoing total joint replacement. Risks and benefits of the procedure have been discussed with the patient and they elect to proceed with surgery. There are no active contraindications to surgery such as ongoing infection or rapidly progressive neurological disease.  Problem List/Past Medical  Primary osteoarthritis of right knee (M17.11)  Rheumatoid arthritis involving right knee (M06.9)  High blood pressure  Osteoarthritis  Osteoporosis  Rheumatoid Arthritis  Skin Cancer  Cataract  Left Eye Urinary Incontinence  Degenerative Disc Disease   Allergies Macrobid *Urinary Anti-infectives* sick  Family History  Cancer  Brother, Sister. Congestive Heart Failure  Sister. First Degree Relatives  reported Heart Disease  Father. Heart disease in female family member before age 6  Hypertension  Mother. Osteoarthritis  Father.  Social History  Under pain contract  Tobacco use  Never smoker. 10/08/2015 Children  0 Current drinker  10/08/2015: Currently drinks beer, wine and hard liquor less than 5 times per week Current work status  retired Exercise  Exercises never; does other Living situation  live alone Marital status  divorced No history of drug/alcohol rehab  Number of flights of stairs before winded  less than 1 Tobacco / smoke exposure  10/08/2015: no Advance Directives  Living Will, Healthcare POA  Medication History Losartan Potassium-HCTZ (50-12.5MG  Tablet, Oral) Active. Boniva (150MG  Tablet, Oral) Active. Oxybutynin Chloride (5MG  Tablet, Oral) Active. Enbrel SureClick (50MG /ML Soln Auto-inj, Subcutaneous) Active. (Sunday) Calcium-Vitamin D (600MG  Tablet Chewable, Oral) Active. TraMADol HCl (50MG  Tablet, Oral) Active.   Past Surgical History Arthroscopy of Knee  right Other Orthopaedic Surgery  Total Hip Replacement  bilateral  Tubal Ligation    Review of Systems   General Not Present- Chills, Fatigue, Fever, Memory Loss, Night Sweats, Weight Gain and Weight Loss. Skin Not Present- Eczema, Hives, Itching, Lesions and Rash. HEENT Not Present- Dentures, Double Vision, Headache, Hearing Loss, Tinnitus and Visual Loss. Respiratory Not Present- Allergies, Chronic Cough, Coughing up blood, Shortness of breath at rest and Shortness of breath with exertion. Cardiovascular Not Present- Chest Pain, Difficulty Breathing Lying Down, Murmur, Palpitations, Racing/skipping heartbeats and Swelling. Gastrointestinal Present- Difficulty Swallowing (she is careful when she eats). Not Present- Abdominal Pain, Bloody Stool, Constipation, Diarrhea, Heartburn, Jaundice, Loss of appetitie, Nausea and Vomiting. Female Genitourinary Not Present- Blood in Urine, Discharge, Flank Pain, Incontinence, Painful Urination, Urgency, Urinary frequency, Urinary Retention, Urinating at Night and Weak urinary stream. Musculoskeletal Present- Back Pain, Joint Pain, Joint Swelling and Morning Stiffness. Not Present- Muscle Pain, Muscle Weakness and Spasms. Neurological Not Present- Blackout spells, Difficulty with balance, Dizziness, Paralysis, Tremor and Weakness. Psychiatric Not Present- Insomnia.  Vitals Weight: 128 lb Height: 61.5in Weight was reported by patient. Height was reported by patient. Body Surface Area: 1.57 m Body Mass Index: 23.79 kg/m  Pulse: 88 (Regular)  BP: 142/70 (Sitting, Right Arm, Standard)  Physical Exam General Mental Status -Alert, cooperative and good historian. General Appearance-pleasant, Not in acute distress. Orientation-Oriented X3. Build & Nutrition-Well nourished and Well developed.  Head and Neck Head-normocephalic, atraumatic . Neck Global Assessment - supple, no bruit auscultated on the right, no bruit auscultated on the left.  Eye Vision-Wears contact lenses. Pupil - Bilateral-Regular and Round. Motion -  Bilateral-EOMI.  Chest and Lung Exam Auscultation Breath sounds - clear at anterior chest wall and clear at posterior chest wall. Adventitious sounds - No Adventitious sounds.  Cardiovascular Auscultation Rhythm - Regular rate and rhythm. Heart Sounds - S1 WNL and S2 WNL. Murmurs & Other Heart Sounds - Auscultation of the heart reveals - No Murmurs.  Abdomen Palpation/Percussion Tenderness - Abdomen is non-tender to palpation. Rigidity (guarding) - Abdomen is soft. Auscultation Auscultation of the abdomen reveals - Bowel sounds normal.  Female Genitourinary Note: Not done, not pertinent to present illness   Musculoskeletal Note: Well-developed female, alert and oriented, no apparent distress. Evaluation of her hip show pain free range of motion. Her left knee shows no effusion. Range of motion left knee about 0 to 125. No tenderness or instability. Right knee, slight effusion. There is no deformity. Range about 5 to 120 with marked crepitus on range of motion. Tender medial and lateral, but no instability noted. Pulses are intact distally. She has a significantly antalgic gait pattern.  Her radiographs of both knees show that she has tricompartmental bone on bone arthritis of the right knee. She does not have any significant deformity with it. Left knee is unremarkable with minimal degeneration.  Assessment & Plan  Rheumatoid arthritis involving right knee (M06.9)  Note:Surgical Plans: Right Total Knee Replacement  Disposition: Home  PCP: Dr. Delfina Redwood - Patient has been seen preoperatively and felt to be stable for surgery.  IV TXA  Anesthesia Issues: None but please be careful with neck extension if general anesthesis due to cervical degeneration.  Patient was instructed on what medications to stop prior to surgery.  Signed electronically by Ok Edwards, III PA-C

## 2016-05-06 ENCOUNTER — Ambulatory Visit: Payer: Self-pay | Admitting: Orthopedic Surgery

## 2016-05-16 ENCOUNTER — Other Ambulatory Visit (HOSPITAL_COMMUNITY): Payer: Self-pay | Admitting: *Deleted

## 2016-05-16 NOTE — Patient Instructions (Addendum)
Mallory Murphy  05/16/2016   Your procedure is scheduled on: 05-26-16  Report to [facility] Main  Entrance take [facility]  elevators to 3rd floor to  [place] at 1045 AM.  Call this number if you have problems the morning of surgery [phone]   Remember: ONLY 1 PERSON MAY GO WITH YOU TO SHORT STAY TO GET  READY MORNING OF [place].   Do not eat food :After Midnight.CLEAR LIQUIDS FROM MIDNIGHT UNTIL 745 AM DAY OF SURGERY, THEN NOTHING BY MOUTH AFTER 745 AM DAY OF SURGERY.      Take these medicines the morning of surgery with A SIP OF WATER: ESCITALOPRAM (LEXAPRO)                               You may not have any metal on your body including hair pins and              piercings  Do not wear jewelry, make-up, lotions, powders or perfumes, deodorant             Do not wear nail polish.  Do not shave  48 hours prior to surgery.              Men may shave face and neck.   Do not bring valuables to the hospital. [place].  Contacts, dentures or bridgework may not be worn into surgery.  Leave suitcase in the car. After surgery it may be brought to your room.                  Please read over the following fact sheets you were given: _____________________________________________________________________                CLEAR LIQUID DIET   Foods Allowed                                                                     Foods Excluded  Coffee and tea, regular and decaf                             liquids that you cannot  Plain Jell-O in any flavor                                             see through such as: Fruit ices (not with fruit pulp)                                     milk, soups, orange juice  Iced Popsicles                                    All solid  food Carbonated beverages, regular and diet                                    Cranberry, grape and apple juices Sports drinks like  Gatorade Lightly seasoned clear broth or consume(fat free) Sugar, honey syrup  Sample Menu Breakfast                                Lunch                                     Supper Cranberry juice                    Beef broth                            Chicken broth Jell-O                                     Grape juice                           Apple juice Coffee or tea                        Jell-O                                      Popsicle                                                Coffee or tea                        Coffee or tea  _____________________________________________________________________  [facility] - Preparing for Surgery Before surgery, you can play an important role.  Because skin is not sterile, your skin needs to be as free of germs as possible.  You can reduce the number of germs on your skin by washing with CHG (chlorahexidine gluconate) soap before surgery.  CHG is an antiseptic cleaner which kills germs and bonds with the skin to continue killing germs even after washing. Please DO NOT use if you have an allergy to CHG or antibacterial soaps.  If your skin becomes reddened/irritated stop using the CHG and inform your nurse when you arrive at Short Stay. Do not shave (including legs and underarms) for at least 48 hours prior to the first CHG shower.  You may shave your face/neck. Please follow these instructions carefully:  1.  Shower with CHG Soap the night before surgery and the  morning of Surgery.  2.  If you choose to wash your hair, wash your hair first as usual with your  normal  shampoo.  3.  After you shampoo, rinse your hair and body thoroughly to remove the  shampoo.  4.  Use CHG as you would any other liquid soap.  You can apply chg directly  to the skin and wash                       Gently with a scrungie or clean washcloth.  5.  Apply the CHG Soap to your body ONLY FROM THE NECK DOWN.   Do not use on face/ open                            Wound or open sores. Avoid contact with eyes, ears mouth and genitals (private parts).                       Wash face,  Genitals (private parts) with your normal soap.             6.  Wash thoroughly, paying special attention to the area where your surgery  will be performed.  7.  Thoroughly rinse your body with warm water from the neck down.  8.  DO NOT shower/wash with your normal soap after using and rinsing off  the CHG Soap.                9.  Pat yourself dry with a clean towel.            10.  Wear clean pajamas.            11.  Place clean sheets on your bed the night of your first shower and do not  sleep with pets. Day of Surgery : Do not apply any lotions/deodorants the morning of surgery.  Please wear clean clothes to the hospital/surgery center.  FAILURE TO FOLLOW THESE INSTRUCTIONS MAY RESULT IN THE CANCELLATION OF YOUR SURGERY PATIENT SIGNATURE_________________________________  NURSE SIGNATURE__________________________________  ________________________________________________________________________   Mallory Murphy  An incentive spirometer is a tool that can help keep your lungs clear and active. This tool measures how well you are filling your lungs with each breath. Taking long deep breaths may help reverse or decrease the chance of developing breathing (pulmonary) problems (especially infection) following:  A long period of time when you are unable to move or be active. BEFORE THE PROCEDURE   If the spirometer includes an indicator to show your best effort, your nurse or respiratory therapist will set it to a desired goal.  If possible, sit up straight or lean slightly forward. Try not to slouch.  Hold the incentive spirometer in an upright position. INSTRUCTIONS FOR USE  1. Sit on the edge of your bed if possible, or sit up as far as you can in bed or on a chair. 2. Hold the incentive spirometer in an upright position. 3. Breathe out  normally. 4. Place the mouthpiece in your mouth and seal your lips tightly around it. 5. Breathe in slowly and as deeply as possible, raising the piston or the ball toward the top of the column. 6. Hold your breath for 3-5 seconds or for as long as possible. Allow the piston or ball to fall to the bottom of the column. 7. Remove the mouthpiece from your mouth and breathe out normally. 8. Rest for a few seconds and repeat Steps 1 through 7 at least 10 times every 1-2 hours when you are awake. Take your time and take a few normal breaths between deep breaths. 9. The spirometer may include an indicator to  show your best effort. Use the indicator as a goal to work toward during each repetition. 10. After each set of 10 deep breaths, practice coughing to be sure your lungs are clear. If you have an incision (the cut made at the time of surgery), support your incision when coughing by placing a pillow or rolled up towels firmly against it. Once you are able to get out of bed, walk around indoors and cough well. You may stop using the incentive spirometer when instructed by your caregiver.  RISKS AND COMPLICATIONS  Take your time so you do not get dizzy or light-headed.  If you are in pain, you may need to take or ask for pain medication before doing incentive spirometry. It is harder to take a deep breath if you are having pain. AFTER USE  Rest and breathe slowly and easily.  It can be helpful to keep track of a log of your progress. Your caregiver can provide you with a simple table to help with this. If you are using the spirometer at home, follow these instructions: [place] IF:   You are having difficultly using the spirometer.  You have trouble using the spirometer as often as instructed.  Your pain medication is not giving enough relief while using the spirometer.  You develop fever of 100.5 F (38.1 C) or higher. SEEK IMMEDIATE MEDICAL CARE IF:   You cough up bloody sputum  that had not been present before.  You develop fever of 102 F (38.9 C) or greater.  You develop worsening pain at or near the incision site. MAKE SURE YOU:   Understand these instructions.  Will watch your condition.  Will get help right away if you are not doing well or get worse. Document Released: 08/11/2006 Document Revised: 06/23/2011 Document Reviewed: 10/12/2006 ExitCare Patient Information 2014 ExitCare, [place].   ________________________________________________________________________  WHAT IS A BLOOD TRANSFUSION? Blood Transfusion Information  A transfusion is the replacement of blood or some of its parts. Blood is made up of multiple cells which provide different functions.  Red blood cells carry oxygen and are used for blood loss replacement.  White blood cells fight against infection.  Platelets control bleeding.  Plasma helps clot blood.  Other blood products are available for specialized needs, such as hemophilia or other clotting disorders. BEFORE THE TRANSFUSION  Who gives blood for transfusions?   Healthy volunteers who are fully evaluated to make sure their blood is safe. This is blood bank blood. Transfusion therapy is the safest it has ever been in the practice of medicine. Before blood is taken from a donor, a complete history is taken to make sure that person has no history of diseases nor engages in risky social behavior (examples are intravenous drug use or sexual activity with multiple partners). The donor's travel history is screened to minimize risk of transmitting infections, such as malaria. The donated blood is tested for signs of infectious diseases, such as HIV and hepatitis. The blood is then tested to be sure it is compatible with you in order to minimize the chance of a transfusion reaction. If you or a relative donates blood, this is often done in anticipation of surgery and is not appropriate for emergency situations. It takes many days to  process the donated blood. RISKS AND COMPLICATIONS Although transfusion therapy is very safe and saves many lives, the main dangers of transfusion include:   Getting an infectious disease.  Developing a transfusion reaction. This is an allergic reaction  to something in the blood you were given. Every precaution is taken to prevent this. The decision to have a blood transfusion has been considered carefully by your caregiver before blood is given. Blood is not given unless the benefits outweigh the risks. AFTER THE TRANSFUSION  Right after receiving a blood transfusion, you will usually feel much better and more energetic. This is especially true if your red blood cells have gotten low (anemic). The transfusion raises the level of the red blood cells which carry oxygen, and this usually causes an energy increase.  The nurse administering the transfusion will monitor you carefully for complications. HOME CARE INSTRUCTIONS  No special instructions are needed after a transfusion. You may find your energy is better. Speak with your caregiver about any limitations on activity for underlying diseases you may have. SEEK MEDICAL CARE IF:   Your condition is not improving after your transfusion.  You develop redness or irritation at the intravenous (IV) site. SEEK IMMEDIATE MEDICAL CARE IF:  Any of the following symptoms occur over the next 12 hours:  Shaking chills.  You have a temperature by mouth above 102 F (38.9 C), not controlled by medicine.  Chest, back, or muscle pain.  People around you feel you are not acting correctly or are confused.  Shortness of breath or difficulty breathing.  Dizziness and fainting.  You get a rash or develop hives.  You have a decrease in urine output.  Your urine turns a dark color or changes to pink, red, or brown. Any of the following symptoms occur over the next 10 days:  You have a temperature by mouth above 102 F (38.9 C), not controlled by  medicine.  Shortness of breath.  Weakness after normal activity.  The white part of the eye turns yellow (jaundice).  You have a decrease in the amount of urine or are urinating less often.  Your urine turns a dark color or changes to pink, red, or brown. Document Released: 03/28/2000 Document Revised: 06/23/2011 Document Reviewed: 11/15/2007 [facility] Patient Information 2014 [place], [place].  _______________________________________________________________________

## 2016-05-16 NOTE — Progress Notes (Signed)
LOV DR POLITE 04-22-16 ON CHART

## 2016-05-19 ENCOUNTER — Encounter (HOSPITAL_COMMUNITY)
Admission: RE | Admit: 2016-05-19 | Discharge: 2016-05-19 | Disposition: A | Discharge status: Home / Self Care | Payer: Medicare Other | Source: Ambulatory Visit | Attending: Orthopedic Surgery | Admitting: Orthopedic Surgery

## 2016-05-19 ENCOUNTER — Encounter (HOSPITAL_COMMUNITY): Payer: Self-pay

## 2016-05-19 DIAGNOSIS — Z01812 Encounter for preprocedural laboratory examination: Secondary | ICD-10-CM | POA: Insufficient documentation

## 2016-05-19 DIAGNOSIS — Z01810 Encounter for preprocedural cardiovascular examination: Secondary | ICD-10-CM | POA: Insufficient documentation

## 2016-05-19 DIAGNOSIS — I10 Essential (primary) hypertension: Secondary | ICD-10-CM | POA: Diagnosis not present

## 2016-05-19 DIAGNOSIS — I252 Old myocardial infarction: Secondary | ICD-10-CM | POA: Insufficient documentation

## 2016-05-19 DIAGNOSIS — M069 Rheumatoid arthritis, unspecified: Secondary | ICD-10-CM | POA: Diagnosis not present

## 2016-05-19 HISTORY — DX: Anemia, unspecified: D64.9

## 2016-05-19 HISTORY — DX: Diverticulosis of intestine, part unspecified, without perforation or abscess without bleeding: K57.90

## 2016-05-19 HISTORY — DX: Spondylosis without myelopathy or radiculopathy, cervical region: M47.812

## 2016-05-19 LAB — CBC
HCT: 28 % — ABNORMAL LOW (ref 36.0–46.0)
HEMOGLOBIN: 9.5 g/dL — AB (ref 12.0–15.0)
MCH: 29.3 pg (ref 26.0–34.0)
MCHC: 33.9 g/dL (ref 30.0–36.0)
MCV: 86.4 fL (ref 78.0–100.0)
Platelets: 305 10*3/uL (ref 150–400)
RBC: 3.24 MIL/uL — ABNORMAL LOW (ref 3.87–5.11)
RDW: 15.4 % (ref 11.5–15.5)
WBC: 5.4 10*3/uL (ref 4.0–10.5)

## 2016-05-19 LAB — COMPREHENSIVE METABOLIC PANEL
ALK PHOS: 67 U/L (ref 38–126)
ALT: 12 U/L — ABNORMAL LOW (ref 14–54)
AST: 24 U/L (ref 15–41)
Albumin: 4 g/dL (ref 3.5–5.0)
Anion gap: 4 — ABNORMAL LOW (ref 5–15)
BUN: 19 mg/dL (ref 6–20)
CALCIUM: 9.2 mg/dL (ref 8.9–10.3)
CO2: 30 mmol/L (ref 22–32)
Chloride: 104 mmol/L (ref 101–111)
Creatinine, Ser: 0.83 mg/dL (ref 0.44–1.00)
GFR calc non Af Amer: >60 mL/min (ref >60)
Glucose, Bld: 91 mg/dL (ref 65–99)
POTASSIUM: 4.2 mmol/L (ref 3.5–5.1)
SODIUM: 138 mmol/L (ref 135–145)
TOTAL PROTEIN: 7.1 g/dL (ref 6.5–8.1)
Total Bilirubin: 0.2 mg/dL — ABNORMAL LOW (ref 0.3–1.2)

## 2016-05-19 LAB — PROTIME-INR
INR: 0.98
Prothrombin Time: 12.9 seconds (ref 11.4–15.2)

## 2016-05-19 LAB — APTT: aPTT: 27 seconds (ref 24–36)

## 2016-05-19 LAB — SURGICAL PCR SCREEN
MRSA, PCR: INVALID — AB
Staphylococcus aureus: INVALID — AB

## 2016-05-19 LAB — ABO/RH: ABO/RH(D): O POS

## 2016-05-19 NOTE — Progress Notes (Signed)
CBC results routed via epic to Dr Wynelle Link

## 2016-05-21 LAB — MRSA CULTURE: CULTURE: NOT DETECTED

## 2016-05-25 NOTE — H&P (Signed)
Mallory Murphy DOB: [birth year] Divorced / Language: English / Race: White Female Date of Admission:  05/26/2016 CC:  Right Knee Pain History of Present Illness The patient is a 71 year old female who comes in for a preoperative History and Physical. The patient is scheduled for a right total knee arthroplasty to be performed by Dr. [name]. [name], MD at [facility] on 05-26-2016. The patient is a 71 year old female who presented for follow up of their knee. The patient is being followed for their right knee pain and osteoarthritis. They are now months out from Synvisc series a recent cortisone injection. Symptoms reported include: pain. The patient feels that they are doing poorly and report their pain level to be moderate. The patient has reported symptom improvement with: Cortisone injections (helps for a few weeks) while they have not gotten any relief of their symptoms with: viscosupplementation. The patient was seen in referral from Dr.[name] for a TKA evaluation. She has rheumatoid arthritis. She has had an elbow replacement, shoulder replacement, and hip replacement before. She said the right knee began hurting her about 6 months ago. It has gotten progressively worse. She has had cortisone and viscosupplement injections from Dr. Veverly Murphy, none of which are currently helping. She did have a recent cortisone injection about 4 weeks ago that provided slight benefit. Knee is hurting her with all activities. It is even starting to hurt at rest now. It has given out on her at times also. She does not have any problems with the left knee. It is felt that she would be a good candidate for knee replacement. They have been treated conservatively in the past for the above stated problem and despite conservative measures, they continue to have progressive pain and severe functional limitations and dysfunction. They have failed non-operative management including home exercise, medications, and  injections. It is felt that they would benefit from undergoing total joint replacement. Risks and benefits of the procedure have been discussed with the patient and they elect to proceed with surgery. There are no active contraindications to surgery such as ongoing infection or rapidly progressive neurological disease.  Problem List/Past Medical  Primary osteoarthritis of right knee (M17.11)  Rheumatoid arthritis involving right knee (M06.9)  High blood pressure  Osteoarthritis  Osteoporosis  Rheumatoid Arthritis  Skin Cancer  Cataract  Left Eye Urinary Incontinence  Degenerative Disc Disease   Allergies Macrobid *Urinary Anti-infectives* sick  Family History  Cancer  Brother, Sister. Congestive Heart Failure  Sister. First Degree Relatives  reported Heart Disease  Father. Heart disease in female family member before age 44  Hypertension  Mother. Osteoarthritis  Father.  Social History  Under pain contract  Tobacco use  Never smoker. 10/08/2015 Children  0 Current drinker  10/08/2015: Currently drinks beer, wine and hard liquor less than 5 times per week Current work status  retired Exercise  Exercises never; does other Living situation  live alone Marital status  divorced No history of drug/alcohol rehab  Number of flights of stairs before winded  less than 1 Tobacco / smoke exposure  10/08/2015: no Advance Directives  Living Will, Healthcare POA  Medication History Losartan Potassium-HCTZ (50-12.5MG  Tablet, Oral) Active. Boniva (150MG  Tablet, Oral) Active. Oxybutynin Chloride (5MG  Tablet, Oral) Active. Enbrel SureClick (50MG /ML Soln Auto-inj, Subcutaneous) Active. (Sunday) Calcium-Vitamin D (600MG  Tablet Chewable, Oral) Active. TraMADol HCl (50MG  Tablet, Oral) Active.   Past Surgical History Arthroscopy of Knee  right Other Orthopaedic Surgery  Total Hip Replacement  bilateral  Tubal Ligation    Review of Systems   General Not Present- Chills, Fatigue, Fever, Memory Loss, Night Sweats, Weight Gain and Weight Loss. Skin Not Present- Eczema, Hives, Itching, Lesions and Rash. HEENT Not Present- Dentures, Double Vision, Headache, Hearing Loss, Tinnitus and Visual Loss. Respiratory Not Present- Allergies, Chronic Cough, Coughing up blood, Shortness of breath at rest and Shortness of breath with exertion. Cardiovascular Not Present- Chest Pain, Difficulty Breathing Lying Down, Murmur, Palpitations, Racing/skipping heartbeats and Swelling. Gastrointestinal Present- Difficulty Swallowing (she is careful when she eats). Not Present- Abdominal Pain, Bloody Stool, Constipation, Diarrhea, Heartburn, Jaundice, Loss of appetitie, Nausea and Vomiting. Female Genitourinary Not Present- Blood in Urine, Discharge, Flank Pain, Incontinence, Painful Urination, Urgency, Urinary frequency, Urinary Retention, Urinating at Night and Weak urinary stream. Musculoskeletal Present- Back Pain, Joint Pain, Joint Swelling and Morning Stiffness. Not Present- Muscle Pain, Muscle Weakness and Spasms. Neurological Not Present- Blackout spells, Difficulty with balance, Dizziness, Paralysis, Tremor and Weakness. Psychiatric Not Present- Insomnia.  Vitals Weight: 128 lb Height: 61.5in Weight was reported by patient. Height was reported by patient. Body Surface Area: 1.57 m Body Mass Index: 23.79 kg/m  Pulse: 88 (Regular)  BP: 142/70 (Sitting, Right Arm, Standard)  Physical Exam General Mental Status -Alert, cooperative and good historian. General Appearance-pleasant, Not in acute distress. Orientation-Oriented X3. Build & Nutrition-Well nourished and Well developed.  Head and Neck Head-normocephalic, atraumatic . Neck Global Assessment - supple, no bruit auscultated on the right, no bruit auscultated on the left.  Eye Vision-Wears contact lenses. Pupil - Bilateral-Regular and Round. Motion -  Bilateral-EOMI.  Chest and Lung Exam Auscultation Breath sounds - clear at anterior chest wall and clear at posterior chest wall. Adventitious sounds - No Adventitious sounds.  Cardiovascular Auscultation Rhythm - Regular rate and rhythm. Heart Sounds - S1 WNL and S2 WNL. Murmurs & Other Heart Sounds - Auscultation of the heart reveals - No Murmurs.  Abdomen Palpation/Percussion Tenderness - Abdomen is non-tender to palpation. Rigidity (guarding) - Abdomen is soft. Auscultation Auscultation of the abdomen reveals - Bowel sounds normal.  Female Genitourinary Note: Not done, not pertinent to present illness   Musculoskeletal Note: Well-developed female, alert and oriented, no apparent distress. Evaluation of her hip show pain free range of motion. Her left knee shows no effusion. Range of motion left knee about 0 to 125. No tenderness or instability. Right knee, slight effusion. There is no deformity. Range about 5 to 120 with marked crepitus on range of motion. Tender medial and lateral, but no instability noted. Pulses are intact distally. She has a significantly antalgic gait pattern.  Her radiographs of both knees show that she has tricompartmental bone on bone arthritis of the right knee. She does not have any significant deformity with it. Left knee is unremarkable with minimal degeneration.  Assessment & Plan  Rheumatoid arthritis involving right knee (M06.9)  Note:Surgical Plans: Right Total Knee Replacement  Disposition: Home  PCP: Dr. Delfina Redwood - Patient has been seen preoperatively and felt to be stable for surgery.  IV TXA  Anesthesia Issues: None but please be careful with neck extension if general anesthesis due to cervical degeneration.  Patient was instructed on what medications to stop prior to surgery.  Signed electronically by Ok Edwards, III PA-C

## 2016-05-26 ENCOUNTER — Inpatient Hospital Stay (HOSPITAL_COMMUNITY): Payer: Medicare Other | Admitting: Registered Nurse

## 2016-05-26 ENCOUNTER — Inpatient Hospital Stay (HOSPITAL_COMMUNITY)
Admission: RE | Admit: 2016-05-26 | Discharge: 2016-05-28 | DRG: 470 | Disposition: A | Discharge status: Home Health | Payer: Medicare Other | Source: Ambulatory Visit | Attending: Orthopedic Surgery | Admitting: Orthopedic Surgery

## 2016-05-26 ENCOUNTER — Encounter (HOSPITAL_COMMUNITY): Payer: Self-pay

## 2016-05-26 ENCOUNTER — Encounter (HOSPITAL_COMMUNITY)
Admission: RE | Disposition: A | Discharge status: Home Health | Payer: Self-pay | Source: Ambulatory Visit | Attending: Orthopedic Surgery

## 2016-05-26 DIAGNOSIS — M069 Rheumatoid arthritis, unspecified: Secondary | ICD-10-CM | POA: Diagnosis present

## 2016-05-26 DIAGNOSIS — Z96643 Presence of artificial hip joint, bilateral: Secondary | ICD-10-CM | POA: Diagnosis present

## 2016-05-26 DIAGNOSIS — K5790 Diverticulosis of intestine, part unspecified, without perforation or abscess without bleeding: Secondary | ICD-10-CM | POA: Diagnosis present

## 2016-05-26 DIAGNOSIS — Z7983 Long term (current) use of bisphosphonates: Secondary | ICD-10-CM | POA: Diagnosis not present

## 2016-05-26 DIAGNOSIS — M1710 Unilateral primary osteoarthritis, unspecified knee: Secondary | ICD-10-CM | POA: Diagnosis present

## 2016-05-26 DIAGNOSIS — Z8261 Family history of arthritis: Secondary | ICD-10-CM | POA: Diagnosis not present

## 2016-05-26 DIAGNOSIS — M25761 Osteophyte, right knee: Secondary | ICD-10-CM | POA: Diagnosis present

## 2016-05-26 DIAGNOSIS — Z96619 Presence of unspecified artificial shoulder joint: Secondary | ICD-10-CM | POA: Diagnosis present

## 2016-05-26 DIAGNOSIS — H269 Unspecified cataract: Secondary | ICD-10-CM | POA: Diagnosis present

## 2016-05-26 DIAGNOSIS — R2689 Other abnormalities of gait and mobility: Secondary | ICD-10-CM | POA: Diagnosis present

## 2016-05-26 DIAGNOSIS — Z85828 Personal history of other malignant neoplasm of skin: Secondary | ICD-10-CM | POA: Diagnosis not present

## 2016-05-26 DIAGNOSIS — M810 Age-related osteoporosis without current pathological fracture: Secondary | ICD-10-CM | POA: Diagnosis present

## 2016-05-26 DIAGNOSIS — Z881 Allergy status to other antibiotic agents status: Secondary | ICD-10-CM | POA: Diagnosis not present

## 2016-05-26 DIAGNOSIS — M1711 Unilateral primary osteoarthritis, right knee: Principal | ICD-10-CM | POA: Diagnosis present

## 2016-05-26 DIAGNOSIS — M179 Osteoarthritis of knee, unspecified: Secondary | ICD-10-CM

## 2016-05-26 DIAGNOSIS — Z79891 Long term (current) use of opiate analgesic: Secondary | ICD-10-CM

## 2016-05-26 DIAGNOSIS — Z79899 Other long term (current) drug therapy: Secondary | ICD-10-CM | POA: Diagnosis not present

## 2016-05-26 DIAGNOSIS — M479 Spondylosis, unspecified: Secondary | ICD-10-CM | POA: Diagnosis present

## 2016-05-26 DIAGNOSIS — R32 Unspecified urinary incontinence: Secondary | ICD-10-CM | POA: Diagnosis present

## 2016-05-26 DIAGNOSIS — Z8249 Family history of ischemic heart disease and other diseases of the circulatory system: Secondary | ICD-10-CM | POA: Diagnosis not present

## 2016-05-26 DIAGNOSIS — I10 Essential (primary) hypertension: Secondary | ICD-10-CM | POA: Diagnosis present

## 2016-05-26 HISTORY — PX: TOTAL KNEE ARTHROPLASTY: SHX125

## 2016-05-26 LAB — TYPE AND SCREEN
ABO/RH(D): O POS
ANTIBODY SCREEN: NEGATIVE

## 2016-05-26 SURGERY — ARTHROPLASTY, KNEE, TOTAL
Anesthesia: Spinal | Site: Knee | Laterality: Right

## 2016-05-26 MED ORDER — DEXAMETHASONE SODIUM PHOSPHATE 10 MG/ML IJ SOLN
INTRAMUSCULAR | Status: AC
  Filled 2016-05-26: qty 1

## 2016-05-26 MED ORDER — ACETAMINOPHEN 325 MG PO TABS: 650.0000 mg | ORAL_TABLET | Freq: Four times a day (QID) | ORAL | Status: DC | PRN

## 2016-05-26 MED ORDER — CEFAZOLIN SODIUM-DEXTROSE 2-4 GM/100ML-% IV SOLN
2.0000 g | INTRAVENOUS | Status: AC
  Administered 2016-05-26: 2 g via INTRAVENOUS
  Filled 2016-05-26: qty 100

## 2016-05-26 MED ORDER — CEFAZOLIN SODIUM-DEXTROSE 2-4 GM/100ML-% IV SOLN
2.0000 g | Freq: Four times a day (QID) | INTRAVENOUS | Status: AC
  Administered 2016-05-26 – 2016-05-27 (×2): 2 g via INTRAVENOUS
  Filled 2016-05-26 (×2): qty 100

## 2016-05-26 MED ORDER — METOCLOPRAMIDE HCL 5 MG PO TABS: 5.0000 mg | ORAL_TABLET | Freq: Three times a day (TID) | ORAL | Status: DC | PRN

## 2016-05-26 MED ORDER — MENTHOL 3 MG MT LOZG: 1.0000 | LOZENGE | OROMUCOSAL | Status: DC | PRN

## 2016-05-26 MED ORDER — ONDANSETRON HCL 4 MG/2ML IJ SOLN
INTRAMUSCULAR | Status: DC | PRN
  Administered 2016-05-26: 4 mg via INTRAVENOUS

## 2016-05-26 MED ORDER — BUPIVACAINE IN DEXTROSE 0.75-8.25 % IT SOLN
INTRATHECAL | Status: DC | PRN
  Administered 2016-05-26: 1.8 mL via INTRATHECAL

## 2016-05-26 MED ORDER — PROPOFOL 10 MG/ML IV BOLUS
INTRAVENOUS | Status: AC
  Filled 2016-05-26: qty 40

## 2016-05-26 MED ORDER — CEFAZOLIN SODIUM-DEXTROSE 2-4 GM/100ML-% IV SOLN
INTRAVENOUS | Status: AC
  Filled 2016-05-26: qty 100

## 2016-05-26 MED ORDER — ONDANSETRON HCL 4 MG/2ML IJ SOLN
INTRAMUSCULAR | Status: AC
  Filled 2016-05-26: qty 2

## 2016-05-26 MED ORDER — ACETAMINOPHEN 10 MG/ML IV SOLN
1000.0000 mg | Freq: Once | INTRAVENOUS | Status: AC
  Administered 2016-05-26: 1000 mg via INTRAVENOUS
  Filled 2016-05-26: qty 100

## 2016-05-26 MED ORDER — ONDANSETRON HCL 4 MG/2ML IJ SOLN
4.0000 mg | Freq: Four times a day (QID) | INTRAMUSCULAR | Status: DC | PRN
  Administered 2016-05-28: 4 mg via INTRAVENOUS
  Filled 2016-05-26: qty 2

## 2016-05-26 MED ORDER — LOSARTAN POTASSIUM-HCTZ 50-12.5 MG PO TABS: 1.0000 | ORAL_TABLET | Freq: Every day | ORAL | Status: DC

## 2016-05-26 MED ORDER — RIVAROXABAN 10 MG PO TABS
10.0000 mg | ORAL_TABLET | Freq: Every day | ORAL | Status: DC
  Administered 2016-05-27 – 2016-05-28 (×2): 10 mg via ORAL
  Filled 2016-05-26 (×2): qty 1

## 2016-05-26 MED ORDER — OXYCODONE HCL 5 MG PO TABS
5.0000 mg | ORAL_TABLET | ORAL | Status: DC | PRN
  Administered 2016-05-26 – 2016-05-28 (×13): 10 mg via ORAL
  Filled 2016-05-26 (×13): qty 2

## 2016-05-26 MED ORDER — BUPIVACAINE LIPOSOME 1.3 % IJ SUSP
20.0000 mL | Freq: Once | INTRAMUSCULAR | Status: DC
  Filled 2016-05-26: qty 20

## 2016-05-26 MED ORDER — PROMETHAZINE HCL 25 MG/ML IJ SOLN
6.2500 mg | INTRAMUSCULAR | Status: DC | PRN
Start: 2016-05-26 — End: 2016-05-26

## 2016-05-26 MED ORDER — MIDAZOLAM HCL 5 MG/5ML IJ SOLN
INTRAMUSCULAR | Status: DC | PRN
  Administered 2016-05-26 (×2): 1 mg via INTRAVENOUS

## 2016-05-26 MED ORDER — SODIUM CHLORIDE 0.9 % IR SOLN
Status: DC | PRN
  Administered 2016-05-26: 1000 mL

## 2016-05-26 MED ORDER — BISACODYL 10 MG RE SUPP: 10.0000 mg | Freq: Every day | RECTAL | Status: DC | PRN

## 2016-05-26 MED ORDER — FLEET ENEMA 7-19 GM/118ML RE ENEM: 1.0000 | ENEMA | Freq: Once | RECTAL | Status: DC | PRN

## 2016-05-26 MED ORDER — METHOCARBAMOL 1000 MG/10ML IJ SOLN
500.0000 mg | Freq: Four times a day (QID) | INTRAVENOUS | Status: DC | PRN
  Administered 2016-05-26: 500 mg via INTRAVENOUS
  Filled 2016-05-26: qty 5
  Filled 2016-05-26: qty 550
  Filled 2016-05-26: qty 5

## 2016-05-26 MED ORDER — HYDROMORPHONE HCL 1 MG/ML IJ SOLN: 0.2500 mg | INTRAMUSCULAR | Status: DC | PRN

## 2016-05-26 MED ORDER — DOCUSATE SODIUM 100 MG PO CAPS
100.0000 mg | ORAL_CAPSULE | Freq: Two times a day (BID) | ORAL | Status: DC
  Administered 2016-05-26 – 2016-05-28 (×4): 100 mg via ORAL
  Filled 2016-05-26 (×4): qty 1

## 2016-05-26 MED ORDER — DEXAMETHASONE SODIUM PHOSPHATE 10 MG/ML IJ SOLN
10.0000 mg | Freq: Once | INTRAMUSCULAR | Status: AC
  Administered 2016-05-27: 10 mg via INTRAVENOUS
  Filled 2016-05-26: qty 1

## 2016-05-26 MED ORDER — ACETAMINOPHEN 500 MG PO TABS
1000.0000 mg | ORAL_TABLET | Freq: Four times a day (QID) | ORAL | Status: AC
  Administered 2016-05-26 – 2016-05-27 (×4): 1000 mg via ORAL
  Filled 2016-05-26 (×4): qty 2

## 2016-05-26 MED ORDER — ACETAMINOPHEN 10 MG/ML IV SOLN
INTRAVENOUS | Status: AC
  Filled 2016-05-26: qty 100

## 2016-05-26 MED ORDER — DEXAMETHASONE SODIUM PHOSPHATE 10 MG/ML IJ SOLN
10.0000 mg | Freq: Once | INTRAMUSCULAR | Status: AC
  Administered 2016-05-26: 10 mg via INTRAVENOUS

## 2016-05-26 MED ORDER — METOCLOPRAMIDE HCL 5 MG/ML IJ SOLN: 5.0000 mg | Freq: Three times a day (TID) | INTRAMUSCULAR | Status: DC | PRN

## 2016-05-26 MED ORDER — FENTANYL CITRATE (PF) 100 MCG/2ML IJ SOLN
INTRAMUSCULAR | Status: DC | PRN
  Administered 2016-05-26: 50 ug via INTRAVENOUS

## 2016-05-26 MED ORDER — MIDAZOLAM HCL 2 MG/2ML IJ SOLN
INTRAMUSCULAR | Status: AC
  Filled 2016-05-26: qty 2

## 2016-05-26 MED ORDER — HYDROCHLOROTHIAZIDE 12.5 MG PO CAPS
12.5000 mg | ORAL_CAPSULE | Freq: Every day | ORAL | Status: DC
  Administered 2016-05-27 – 2016-05-28 (×2): 12.5 mg via ORAL
  Filled 2016-05-26 (×3): qty 1

## 2016-05-26 MED ORDER — CHLORHEXIDINE GLUCONATE 4 % EX LIQD: 60.0000 mL | Freq: Once | CUTANEOUS | Status: DC

## 2016-05-26 MED ORDER — TRAMADOL HCL 50 MG PO TABS
50.0000 mg | ORAL_TABLET | Freq: Four times a day (QID) | ORAL | Status: DC | PRN
  Administered 2016-05-26: 100 mg via ORAL
  Filled 2016-05-26: qty 2

## 2016-05-26 MED ORDER — OXYBUTYNIN CHLORIDE ER 5 MG PO TB24
5.0000 mg | ORAL_TABLET | Freq: Every day | ORAL | Status: DC
  Administered 2016-05-26 – 2016-05-27 (×2): 5 mg via ORAL
  Filled 2016-05-26 (×2): qty 1

## 2016-05-26 MED ORDER — SODIUM CHLORIDE 0.9 % IJ SOLN
INTRAMUSCULAR | Status: DC | PRN
  Administered 2016-05-26: 30 mL

## 2016-05-26 MED ORDER — MORPHINE SULFATE (PF) 4 MG/ML IV SOLN
1.0000 mg | INTRAVENOUS | Status: DC | PRN
  Filled 2016-05-26: qty 1

## 2016-05-26 MED ORDER — LACTATED RINGERS IV SOLN
INTRAVENOUS | Status: DC
  Administered 2016-05-26: 13:00:00 via INTRAVENOUS

## 2016-05-26 MED ORDER — ESCITALOPRAM OXALATE 10 MG PO TABS
10.0000 mg | ORAL_TABLET | Freq: Every day | ORAL | Status: DC
  Administered 2016-05-27 – 2016-05-28 (×2): 10 mg via ORAL
  Filled 2016-05-26 (×2): qty 1

## 2016-05-26 MED ORDER — CLONIDINE HCL 0.1 MG PO TABS
0.1000 mg | ORAL_TABLET | Freq: Two times a day (BID) | ORAL | Status: DC | PRN
  Administered 2016-05-26: 0.1 mg via ORAL
  Filled 2016-05-26: qty 1

## 2016-05-26 MED ORDER — FENTANYL CITRATE (PF) 100 MCG/2ML IJ SOLN
INTRAMUSCULAR | Status: AC
  Filled 2016-05-26: qty 2

## 2016-05-26 MED ORDER — ROPIVACAINE HCL 7.5 MG/ML IJ SOLN
INTRAMUSCULAR | Status: DC | PRN
  Administered 2016-05-26: 20 mL via PERINEURAL

## 2016-05-26 MED ORDER — ACETAMINOPHEN 650 MG RE SUPP: 650.0000 mg | Freq: Four times a day (QID) | RECTAL | Status: DC | PRN

## 2016-05-26 MED ORDER — LOSARTAN POTASSIUM 50 MG PO TABS
50.0000 mg | ORAL_TABLET | Freq: Every day | ORAL | Status: DC
  Administered 2016-05-27 – 2016-05-28 (×2): 50 mg via ORAL
  Filled 2016-05-26 (×3): qty 1

## 2016-05-26 MED ORDER — SODIUM CHLORIDE 0.9 % IV SOLN
INTRAVENOUS | Status: DC
  Administered 2016-05-26: 18:00:00 via INTRAVENOUS

## 2016-05-26 MED ORDER — LIDOCAINE 2% (20 MG/ML) 5 ML SYRINGE
INTRAMUSCULAR | Status: DC | PRN
  Administered 2016-05-26: 100 mg via INTRAVENOUS

## 2016-05-26 MED ORDER — TRANEXAMIC ACID 1000 MG/10ML IV SOLN
1000.0000 mg | Freq: Once | INTRAVENOUS | Status: AC
  Administered 2016-05-26: 1000 mg via INTRAVENOUS
  Filled 2016-05-26: qty 1100

## 2016-05-26 MED ORDER — PHENOL 1.4 % MT LIQD
1.0000 | OROMUCOSAL | Status: DC | PRN
  Filled 2016-05-26: qty 177

## 2016-05-26 MED ORDER — BUPIVACAINE LIPOSOME 1.3 % IJ SUSP
INTRAMUSCULAR | Status: DC | PRN
  Administered 2016-05-26: 20 mL

## 2016-05-26 MED ORDER — SODIUM CHLORIDE 0.9 % IV SOLN
1000.0000 mg | INTRAVENOUS | Status: AC
  Administered 2016-05-26: 1000 mg via INTRAVENOUS
  Filled 2016-05-26: qty 1100

## 2016-05-26 MED ORDER — LIDOCAINE 2% (20 MG/ML) 5 ML SYRINGE
INTRAMUSCULAR | Status: AC
  Filled 2016-05-26: qty 5

## 2016-05-26 MED ORDER — PROPOFOL 500 MG/50ML IV EMUL
INTRAVENOUS | Status: DC | PRN
  Administered 2016-05-26: 40 ug/kg/min via INTRAVENOUS

## 2016-05-26 MED ORDER — POLYETHYLENE GLYCOL 3350 17 G PO PACK: 17.0000 g | PACK | Freq: Every day | ORAL | Status: DC | PRN

## 2016-05-26 MED ORDER — SODIUM CHLORIDE 0.9 % IJ SOLN
INTRAMUSCULAR | Status: AC
  Filled 2016-05-26: qty 50

## 2016-05-26 MED ORDER — ONDANSETRON HCL 4 MG PO TABS
4.0000 mg | ORAL_TABLET | Freq: Four times a day (QID) | ORAL | Status: DC | PRN
Start: 2016-05-26 — End: 2016-05-28

## 2016-05-26 MED ORDER — DIPHENHYDRAMINE HCL 12.5 MG/5ML PO ELIX
12.5000 mg | ORAL_SOLUTION | ORAL | Status: DC | PRN
  Administered 2016-05-26: 25 mg via ORAL
  Filled 2016-05-26: qty 10

## 2016-05-26 MED ORDER — METHOCARBAMOL 500 MG PO TABS
500.0000 mg | ORAL_TABLET | Freq: Four times a day (QID) | ORAL | Status: DC | PRN
  Administered 2016-05-27 – 2016-05-28 (×6): 500 mg via ORAL
  Filled 2016-05-26 (×6): qty 1

## 2016-05-26 SURGICAL SUPPLY — 49 items
BAG DECANTER FOR FLEXI CONT (MISCELLANEOUS) ×2 IMPLANT
BAG SPEC THK2 15X12 ZIP CLS (MISCELLANEOUS) ×1
BAG ZIPLOCK 12X15 (MISCELLANEOUS) ×2 IMPLANT
BANDAGE ACE 6X5 VEL STRL LF (GAUZE/BANDAGES/DRESSINGS) ×2 IMPLANT
BLADE SAG 18X100X1.27 (BLADE) ×2 IMPLANT
BLADE SAW SGTL 11.0X1.19X90.0M (BLADE) ×2 IMPLANT
BOWL SMART MIX CTS (DISPOSABLE) ×2 IMPLANT
CAP KNEE TOTAL 3 SIGMA ×2 IMPLANT
CEMENT HV SMART SET (Cement) ×4 IMPLANT
CLOTH BEACON ORANGE TIMEOUT ST (SAFETY) ×2 IMPLANT
CUFF TOURN SGL QUICK 34 (TOURNIQUET CUFF) ×2
CUFF TRNQT CYL 34X4X40X1 (TOURNIQUET CUFF) ×1 IMPLANT
DECANTER SPIKE VIAL GLASS SM (MISCELLANEOUS) ×2 IMPLANT
DRAPE U-SHAPE 47X51 STRL (DRAPES) ×2 IMPLANT
DRSG ADAPTIC 3X8 NADH LF (GAUZE/BANDAGES/DRESSINGS) ×2 IMPLANT
DRSG PAD ABDOMINAL 8X10 ST (GAUZE/BANDAGES/DRESSINGS) ×2 IMPLANT
DURAPREP 26ML APPLICATOR (WOUND CARE) ×2 IMPLANT
ELECT REM PT RETURN 9FT ADLT (ELECTROSURGICAL) ×2
ELECTRODE REM PT RTRN 9FT ADLT (ELECTROSURGICAL) ×1 IMPLANT
EVACUATOR 1/8 PVC DRAIN (DRAIN) ×2 IMPLANT
GAUZE SPONGE 4X4 12PLY STRL (GAUZE/BANDAGES/DRESSINGS) ×2 IMPLANT
GLOVE BIO SURGEON STRL SZ7.5 (GLOVE) IMPLANT
GLOVE BIO SURGEON STRL SZ8 (GLOVE) ×2 IMPLANT
GLOVE BIOGEL PI IND STRL 6.5 (GLOVE) IMPLANT
GLOVE BIOGEL PI IND STRL 8 (GLOVE) ×1 IMPLANT
GLOVE BIOGEL PI INDICATOR 6.5 (GLOVE)
GLOVE BIOGEL PI INDICATOR 8 (GLOVE) ×1
GLOVE SURG SS PI 6.5 STRL IVOR (GLOVE) IMPLANT
GOWN STRL REUS W/TWL LRG LVL3 (GOWN DISPOSABLE) ×2 IMPLANT
GOWN STRL REUS W/TWL XL LVL3 (GOWN DISPOSABLE) ×2 IMPLANT
HANDPIECE INTERPULSE COAX TIP (DISPOSABLE) ×1
IMMOBILIZER KNEE 20 (SOFTGOODS) ×2
IMMOBILIZER KNEE 20 THIGH 36 (SOFTGOODS) ×1 IMPLANT
MANIFOLD NEPTUNE II (INSTRUMENTS) ×2 IMPLANT
NS IRRIG 1000ML POUR BTL (IV SOLUTION) ×2 IMPLANT
PACK TOTAL KNEE CUSTOM (KITS) ×2 IMPLANT
PADDING CAST COTTON 6X4 STRL (CAST SUPPLIES) ×4 IMPLANT
POSITIONER SURGICAL ARM (MISCELLANEOUS) ×2 IMPLANT
SET HNDPC FAN SPRY TIP SCT (DISPOSABLE) ×1 IMPLANT
STRIP CLOSURE SKIN 1/2X4 (GAUZE/BANDAGES/DRESSINGS) ×2 IMPLANT
SUT MNCRL AB 4-0 PS2 18 (SUTURE) ×2 IMPLANT
SUT VIC AB 2-0 CT1 27 (SUTURE) ×3
SUT VIC AB 2-0 CT1 TAPERPNT 27 (SUTURE) ×3 IMPLANT
SUT VLOC 180 0 24IN GS25 (SUTURE) ×2 IMPLANT
SYR 50ML LL SCALE MARK (SYRINGE) ×2 IMPLANT
TRAY FOLEY CATH 14FR (SET/KITS/TRAYS/PACK) ×2 IMPLANT
WATER STERILE IRR 1500ML POUR (IV SOLUTION) ×2 IMPLANT
WRAP KNEE MAXI GEL POST OP (GAUZE/BANDAGES/DRESSINGS) ×2 IMPLANT
YANKAUER SUCT BULB TIP 10FT TU (MISCELLANEOUS) ×2 IMPLANT

## 2016-05-26 NOTE — Transfer of Care (Signed)
Immediate Anesthesia Transfer of Care Note  Patient: Mallory Murphy  Procedure(s) Performed: Procedure(s): RIGHT TOTAL KNEE ARTHROPLASTY (Right)  Patient Location: PACU  Anesthesia Type:Spinal  Level of Consciousness:  sedated, patient cooperative and responds to stimulation  Airway & Oxygen Therapy:Patient Spontanous Breathing and Patient connected to face mask oxgen  Post-op Assessment:  Report given to PACU RN and Post -op Vital signs reviewed and stable  Post vital signs:  Reviewed and stable  Last Vitals:  Vitals:   05/26/16 1051  BP: (!) 155/60  Pulse: 91  Resp: 16  Temp: 0000000 C    Complications: No apparent anesthesia complications

## 2016-05-26 NOTE — Anesthesia Procedure Notes (Signed)
Spinal  Start time: 05/26/2016 1:20 PM End time: 05/26/2016 1:26 PM Staffing Resident/CRNA: Denielle Bayard A Preanesthetic Checklist Completed: patient identified, site marked, surgical consent, pre-op evaluation, timeout performed, IV checked, risks and benefits discussed and monitors and equipment checked Spinal Block Patient position: sitting Prep: DuraPrep Patient monitoring: heart rate, continuous pulse ox and blood pressure Approach: midline Location: L3-4 Injection technique: single-shot Needle Needle type: Pencan  Needle gauge: 24 G Needle length: 10 cm Assessment Sensory level: T4 Additional Notes Placed in sitting position for spinal placement. Spinal kit checked and verified. One attempt. + CSF, - heme. Pt tolerated well.

## 2016-05-26 NOTE — Progress Notes (Signed)
Made PA aware of HTN, Barron Alvine ordered clonidine 0.1mg  q 12 PRN for systolic greater than [id number]. He advised to give clonidine and not patients home med of cozaar+HCTZ. Recheck of first BP was [id number] systolic but patient was hurting, recheck manual now is 118/48.

## 2016-05-26 NOTE — Anesthesia Preprocedure Evaluation (Addendum)
Anesthesia Evaluation  Patient identified by MRN, date of birth, ID band Patient awake    Reviewed: Allergy & Precautions, NPO status , Patient's Chart, lab work & pertinent test results  Airway Mallampati: II  TM Distance: >3 FB Neck ROM: Full    Dental no notable dental hx.    Pulmonary neg pulmonary ROS,    Pulmonary exam normal breath sounds clear to auscultation       Cardiovascular hypertension, Pt. on medications and Pt. on home beta blockers Normal cardiovascular exam Rhythm:Regular Rate:Normal     Neuro/Psych negative neurological ROS  negative psych ROS   GI/Hepatic negative GI ROS, Neg liver ROS,   Endo/Other  negative endocrine ROS  Renal/GU negative Renal ROS  negative genitourinary   Musculoskeletal  (+) Arthritis , Rheumatoid disorders,    Abdominal   Peds negative pediatric ROS (+)  Hematology  (+) anemia ,   Anesthesia Other Findings   Reproductive/Obstetrics negative OB ROS                             Anesthesia Physical Anesthesia Plan  ASA: III  Anesthesia Plan: Spinal   Post-op Pain Management:    Induction: Intravenous  Airway Management Planned: Simple Face Mask  Additional Equipment:   Intra-op Plan:   Post-operative Plan:   Informed Consent: I have reviewed the patients History and Physical, chart, labs and discussed the procedure including the risks, benefits and alternatives for the proposed anesthesia with the patient or authorized representative who has indicated his/her understanding and acceptance.   Dental advisory given  Plan Discussed with: CRNA and Surgeon  Anesthesia Plan Comments:         Anesthesia Quick Evaluation

## 2016-05-26 NOTE — Anesthesia Procedure Notes (Signed)
Performed by: Yatzil Clippinger A       

## 2016-05-26 NOTE — Anesthesia Procedure Notes (Signed)
Anesthesia Regional Block:  Adductor canal block  Pre-Anesthetic Checklist: ,, timeout performed, Correct Patient, Correct Site, Correct Laterality, Correct Procedure, Correct Position, site marked, Risks and benefits discussed,  Surgical consent,  Pre-op evaluation,  At surgeon's request and post-op pain management  Laterality: Right  Prep: chloraprep       Needles:  Injection technique: Single-shot  Needle Type: Echogenic Needle     Needle Length: 9cm 9 cm Needle Gauge: 21 G    Additional Needles:  Procedures: ultrasound guided (picture in chart) Adductor canal block Narrative:  Start time: 05/26/2016 1:05 PM End time: 05/26/2016 1:11 PM Injection made incrementally with aspirations every 5 mL.  Performed by: Personally  Anesthesiologist: Andria Head  Additional Notes: Patient tolerated the procedure well without complications

## 2016-05-26 NOTE — Op Note (Signed)
OPERATIVE REPORT-TOTAL KNEE ARTHROPLASTY   Pre-operative diagnosis- Osteoarthritis  Right knee(s)  Post-operative diagnosis- Osteoarthritis Right knee(s)  Procedure-  Right  Total Knee Arthroplasty  Surgeon- [name]. Deonne Rooks, MD  Assistant- Arlee Muslim, PA-C   Anesthesia-  Adductor canal block and spinal  EBL-* No blood loss amount entered *   Drains Hemovac  Tourniquet time-  Total Tourniquet Time Documented: Thigh (Right) - 30 minutes Total: Thigh (Right) - 30 minutes     Complications- None  Condition-PACU - hemodynamically stable.   Brief Clinical Note  Mallory Murphy is a 71 y.o. year old female with end stage OA of her right knee with progressively worsening pain and dysfunction. She has constant pain, with activity and at rest and significant functional deficits with difficulties even with ADLs. She has had extensive non-op management including analgesics, injections of cortisone, and home exercise program, but remains in significant pain with significant dysfunction.Radiographs show bone on bone arthritis all 3 compartments. She presents now for right Total Knee Arthroplasty.    Procedure in detail---   The patient is brought into the operating room and positioned supine on the operating table. After successful administration of  Adductor canal block and spinal,   a tourniquet is placed high on the  Right thigh(s) and the lower extremity is prepped and draped in the usual sterile fashion. Time out is performed by the operating team and then the  Right lower extremity is wrapped in Esmarch, knee flexed and the tourniquet inflated to 300 mmHg.       A midline incision is made with a ten blade through the subcutaneous tissue to the level of the extensor mechanism. A fresh blade is used to make a medial parapatellar arthrotomy. Soft tissue over the proximal medial tibia is subperiosteally elevated to the joint line with a knife and into the semimembranosus bursa with a  Cobb elevator. Soft tissue over the proximal lateral tibia is elevated with attention being paid to avoiding the patellar tendon on the tibial tubercle. The patella is everted, knee flexed 90 degrees and the ACL and PCL are removed. Findings are bone on bone all 3 compartments with large global osteophytes.        The drill is used to create a starting hole in the distal femur and the canal is thoroughly irrigated with sterile saline to remove the fatty contents. The 5 degree Right  valgus alignment guide is placed into the femoral canal and the distal femoral cutting block is pinned to remove 10 mm off the distal femur. Resection is made with an oscillating saw.      The tibia is subluxed forward and the menisci are removed. The extramedullary alignment guide is placed referencing proximally at the medial aspect of the tibial tubercle and distally along the second metatarsal axis and tibial crest. The block is pinned to remove 53mm off the more deficient medial  side. Resection is made with an oscillating saw. Size 2.5 is the most appropriate size for the tibia and the proximal tibia is prepared with the modular drill and keel punch for that size.      The femoral sizing guide is placed and size 3 is most appropriate. Rotation is marked off the epicondylar axis and confirmed by creating a rectangular flexion gap at 90 degrees. The size 3 cutting block is pinned in this rotation and the anterior, posterior and chamfer cuts are made with the oscillating saw. The intercondylar block is then placed and that cut  is made.      Trial size 2.5 tibial component, trial size 3 posterior stabilized femur and a 12.5  mm posterior stabilized rotating platform insert trial is placed. Full extension is achieved with excellent varus/valgus and anterior/posterior balance throughout full range of motion. The patella is everted and thickness measured to be 21  mm. Free hand resection is taken to 12 mm, a 35 template is placed, lug  holes are drilled, trial patella is placed, and it tracks normally. Osteophytes are removed off the posterior femur with the trial in place. All trials are removed and the cut bone surfaces prepared with pulsatile lavage. Cement is mixed and once ready for implantation, the size 2.5 tibial implant, size  3 posterior stabilized femoral component, and the size 35 patella are cemented in place and the patella is held with the clamp. The trial insert is placed and the knee held in full extension. The Exparel (20 ml mixed with 30 ml saline) and .25% Bupivicaine, are injected into the extensor mechanism, posterior capsule, medial and lateral gutters and subcutaneous tissues.  All extruded cement is removed and once the cement is hard the permanent 12.5 mm posterior stabilized rotating platform insert is placed into the tibial tray.      The wound is copiously irrigated with saline solution and the extensor mechanism closed over a hemovac drain with #1 V-loc suture. The tourniquet is released for a total tourniquet time of 30  minutes. Flexion against gravity is 140 degrees and the patella tracks normally. Subcutaneous tissue is closed with 2.0 vicryl and subcuticular with running 4.0 Monocryl. The incision is cleaned and dried and steri-strips and a bulky sterile dressing are applied. The limb is placed into a knee immobilizer and the patient is awakened and transported to recovery in stable condition.      Please note that a surgical assistant was a medical necessity for this procedure in order to perform it in a safe and expeditious manner. Surgical assistant was necessary to retract the ligaments and vital neurovascular structures to prevent injury to them and also necessary for proper positioning of the limb to allow for anatomic placement of the prosthesis.   [name] Winfred Redel, MD    05/26/2016, 2:27 PM

## 2016-05-26 NOTE — Interval H&P Note (Signed)
History and Physical Interval Note:  05/26/2016 12:38 PM  Mallory Murphy  has presented today for surgery, with the diagnosis of right knee rheumatoid arthritis  The various methods of treatment have been discussed with the patient and family. After consideration of risks, benefits and other options for treatment, the patient has consented to  Procedure(s): RIGHT TOTAL KNEE ARTHROPLASTY (Right) as a surgical intervention .  The patient's history has been reviewed, patient examined, no change in status, stable for surgery.  I have reviewed the patient's chart and labs.  Questions were answered to the patient's satisfaction.     [name]

## 2016-05-27 ENCOUNTER — Encounter (HOSPITAL_COMMUNITY): Payer: Self-pay | Admitting: Orthopedic Surgery

## 2016-05-27 LAB — CBC
HEMATOCRIT: 25.8 % — AB (ref 36.0–46.0)
Hemoglobin: 8.8 g/dL — ABNORMAL LOW (ref 12.0–15.0)
MCH: 29.4 pg (ref 26.0–34.0)
MCHC: 34.1 g/dL (ref 30.0–36.0)
MCV: 86.3 fL (ref 78.0–100.0)
Platelets: 272 10*3/uL (ref 150–400)
RBC: 2.99 MIL/uL — ABNORMAL LOW (ref 3.87–5.11)
RDW: 15.2 % (ref 11.5–15.5)
WBC: 12.5 10*3/uL — ABNORMAL HIGH (ref 4.0–10.5)

## 2016-05-27 LAB — BASIC METABOLIC PANEL
ANION GAP: 6 (ref 5–15)
BUN: 15 mg/dL (ref 6–20)
CALCIUM: 8.4 mg/dL — AB (ref 8.9–10.3)
CHLORIDE: 102 mmol/L (ref 101–111)
CO2: 26 mmol/L (ref 22–32)
Creatinine, Ser: 0.87 mg/dL (ref 0.44–1.00)
GFR calc non Af Amer: >60 mL/min (ref >60)
Glucose, Bld: 137 mg/dL — ABNORMAL HIGH (ref 65–99)
Potassium: 4 mmol/L (ref 3.5–5.1)
SODIUM: 134 mmol/L — AB (ref 135–145)

## 2016-05-27 MED ORDER — POLYSACCHARIDE IRON COMPLEX 150 MG PO CAPS: 150.0000 mg | ORAL_CAPSULE | Freq: Two times a day (BID) | ORAL | 0 refills | Status: DC

## 2016-05-27 MED ORDER — OXYCODONE HCL 5 MG PO TABS: 5.0000 mg | ORAL_TABLET | ORAL | 0 refills | Status: DC | PRN

## 2016-05-27 MED ORDER — POLYSACCHARIDE IRON COMPLEX 150 MG PO CAPS
150.0000 mg | ORAL_CAPSULE | Freq: Every day | ORAL | Status: DC
  Administered 2016-05-27 – 2016-05-28 (×2): 150 mg via ORAL
  Filled 2016-05-27 (×2): qty 1

## 2016-05-27 MED ORDER — METHOCARBAMOL 500 MG PO TABS: 500.0000 mg | ORAL_TABLET | Freq: Four times a day (QID) | ORAL | 0 refills | Status: DC | PRN

## 2016-05-27 MED ORDER — FUROSEMIDE 40 MG PO TABS
40.0000 mg | ORAL_TABLET | Freq: Every day | ORAL | Status: AC
  Administered 2016-05-27: 40 mg via ORAL
  Filled 2016-05-27: qty 1

## 2016-05-27 MED ORDER — TRAMADOL HCL 50 MG PO TABS: 50.0000 mg | ORAL_TABLET | Freq: Four times a day (QID) | ORAL | 0 refills | Status: DC | PRN

## 2016-05-27 MED ORDER — RIVAROXABAN 10 MG PO TABS: 10.0000 mg | ORAL_TABLET | Freq: Every day | ORAL | 0 refills | Status: DC

## 2016-05-27 NOTE — Anesthesia Postprocedure Evaluation (Signed)
Anesthesia Post Note  Patient: Mallory Murphy  Procedure(s) Performed: Procedure(s) (LRB): RIGHT TOTAL KNEE ARTHROPLASTY (Right)  Patient location during evaluation: PACU Anesthesia Type: Spinal Level of consciousness: oriented and awake and alert Pain management: pain level controlled Vital Signs Assessment: post-procedure vital signs reviewed and stable Respiratory status: spontaneous breathing, respiratory function stable and patient connected to nasal cannula oxygen Cardiovascular status: blood pressure returned to baseline and stable Postop Assessment: no headache and no backache Anesthetic complications: no       Last Vitals:  Vitals:   05/27/16 0142 05/27/16 0500  BP: 135/65 (!) 159/60  Pulse: 81 79  Resp: 16 16  Temp: 36.6 C 37.1 C    Last Pain:  Vitals:   05/27/16 0650  TempSrc:   PainSc: 3                  Caleesi Kohl S

## 2016-05-27 NOTE — Discharge Instructions (Addendum)
° °Dr. Frank [name] °Total Joint Specialist °[facility] °[address] °[place], [place] [address] °[phone] ° °TOTAL KNEE REPLACEMENT POSTOPERATIVE DIRECTIONS ° °Knee Rehabilitation, Guidelines Following Surgery  °Results after knee surgery are often greatly improved when you follow the exercise, range of motion and muscle strengthening exercises prescribed by your doctor. Safety measures are also important to protect the knee from further injury. Any time any of these exercises cause you to have increased pain or swelling in your knee joint, decrease the amount until you are comfortable again and slowly increase them. If you have problems or questions, call your caregiver or physical therapist for advice.  ° °HOME CARE INSTRUCTIONS  °Remove items at home which could result in a fall. This includes throw rugs or furniture in walking pathways.  °· ICE to the affected knee every three hours for 30 minutes at a time and then as needed for pain and swelling.  Continue to use ice on the knee for pain and swelling from surgery. You may notice swelling that will progress down to the foot and ankle.  This is normal after surgery.  Elevate the leg when you are not up walking on it.   °· Continue to use the breathing machine which will help keep your temperature down.  It is common for your temperature to cycle up and down following surgery, especially at night when you are not up moving around and exerting yourself.  The breathing machine keeps your lungs expanded and your temperature down. °· Do not place pillow under knee, focus on keeping the knee straight while resting ° °DIET °You may resume your previous home diet once your are discharged from the hospital. ° °DRESSING / WOUND CARE / SHOWERING °You may shower 3 days after surgery, but keep the wounds dry during showering.  You may use an occlusive plastic wrap (Press'n Seal for example), NO SOAKING/SUBMERGING IN THE BATHTUB.  If the  bandage gets wet, change with a clean dry gauze.  If the incision gets wet, pat the wound dry with a clean towel. °You may start showering once you are discharged home but do not submerge the incision under water. Just pat the incision dry and apply a dry gauze dressing on daily. °Change the surgical dressing daily and reapply a dry dressing each time. ° °ACTIVITY °Walk with your walker as instructed. °Use walker as long as suggested by your caregivers. °Avoid periods of inactivity such as sitting longer than an hour when not asleep. This helps prevent blood clots.  °You may resume a sexual relationship in one month or when given the OK by your doctor.  °You may return to work once you are cleared by your doctor.  °Do not drive a car for 6 weeks or until released by you surgeon.  °Do not drive while taking narcotics. ° °WEIGHT BEARING °Weight bearing as tolerated with assist device (walker, cane, etc) as directed, use it as long as suggested by your surgeon or therapist, typically at least 4-6 weeks. ° °POSTOPERATIVE CONSTIPATION PROTOCOL °Constipation - defined medically as fewer than three stools per week and severe constipation as less than one stool per week. ° °One of the most common issues patients have following surgery is constipation.  Even if you have a regular bowel pattern at home, your normal regimen is likely to be disrupted due to multiple reasons following surgery.  Combination of anesthesia, postoperative narcotics, change in appetite and fluid intake all can affect your bowels.    In order to avoid complications following surgery, here are some recommendations in order to help you during your recovery period. ° °Colace (docusate) - Pick up an over-the-counter form of Colace or another stool softener and take twice a day as long as you are requiring postoperative pain medications.  Take with a full glass of water daily.  If you experience loose stools or diarrhea, hold the colace until you stool forms  back up.  If your symptoms do not get better within 1 week or if they get worse, check with your doctor. ° °Dulcolax (bisacodyl) - Pick up over-the-counter and take as directed by the product packaging as needed to assist with the movement of your bowels.  Take with a full glass of water.  Use this product as needed if not relieved by Colace only.  ° °MiraLax (polyethylene glycol) - Pick up over-the-counter to have on hand.  MiraLax is a solution that will increase the amount of water in your bowels to assist with bowel movements.  Take as directed and can mix with a glass of water, juice, soda, coffee, or tea.  Take if you go more than two days without a movement. °Do not use MiraLax more than once per day. Call your doctor if you are still constipated or irregular after using this medication for 7 days in a row. ° °If you continue to have problems with postoperative constipation, please contact the office for further assistance and recommendations.  If you experience "the worst abdominal pain ever" or develop nausea or vomiting, please contact the office immediatly for further recommendations for treatment. ° °ITCHING ° If you experience itching with your medications, try taking only a single pain pill, or even half a pain pill at a time.  You can also use Benadryl over the counter for itching or also to help with sleep.  ° °TED HOSE STOCKINGS °Wear the elastic stockings on both legs for three weeks following surgery during the day but you may remove then at night for sleeping. ° °MEDICATIONS °See your medication summary on the “After Visit Summary” that the nursing staff will review with you prior to discharge.  You may have some home medications which will be placed on hold until you complete the course of blood thinner medication.  It is important for you to complete the blood thinner medication as prescribed by your surgeon.  Continue your approved medications as instructed at time of  discharge. ° °PRECAUTIONS °If you experience chest pain or shortness of breath - call 911 immediately for transfer to the hospital emergency department.  °If you develop a fever greater that 101 F, purulent drainage from wound, increased redness or drainage from wound, foul odor from the wound/dressing, or calf pain - CONTACT YOUR SURGEON.   °                                                °FOLLOW-UP APPOINTMENTS °Make sure you keep all of your appointments after your operation with your surgeon and caregivers. You should call the office at the above phone number and make an appointment for approximately two weeks after the date of your surgery or on the date instructed by your surgeon outlined in the "After Visit Summary". ° ° °RANGE OF MOTION AND STRENGTHENING EXERCISES  °Rehabilitation of the knee is important following a knee injury or   an operation. After just a few days of immobilization, the muscles of the thigh which control the knee become weakened and shrink (atrophy). Knee exercises are designed to build up the tone and strength of the thigh muscles and to improve knee motion. Often times heat used for twenty to thirty minutes before working out will loosen up your tissues and help with improving the range of motion but do not use heat for the first two weeks following surgery. These exercises can be done on a training (exercise) mat, on the floor, on a table or on a bed. Use what ever works the best and is most comfortable for you Knee exercises include:  °Leg Lifts - While your knee is still immobilized in a splint or cast, you can do straight leg raises. Lift the leg to 60 degrees, hold for 3 sec, and slowly lower the leg. Repeat 10-20 times 2-3 times daily. Perform this exercise against resistance later as your knee gets better.  °Quad and Hamstring Sets - Tighten up the muscle on the front of the thigh (Quad) and hold for 5-10 sec. Repeat this 10-20 times hourly. Hamstring sets are done by pushing the  foot backward against an object and holding for 5-10 sec. Repeat as with quad sets.  °· Leg Slides: Lying on your back, slowly slide your foot toward your buttocks, bending your knee up off the floor (only go as far as is comfortable). Then slowly slide your foot back down until your leg is flat on the floor again. °· Angel Wings: Lying on your back spread your legs to the side as far apart as you can without causing discomfort.  °A rehabilitation program following serious knee injuries can speed recovery and prevent re-injury in the future due to weakened muscles. Contact your doctor or a physical therapist for more information on knee rehabilitation.  ° °IF YOU ARE TRANSFERRED TO A SKILLED REHAB FACILITY °If the patient is transferred to a skilled rehab facility following release from the hospital, a list of the current medications will be sent to the facility for the patient to continue.  When discharged from the skilled rehab facility, please have the facility set up the patient's Home Health Physical Therapy prior to being released. Also, the skilled facility will be responsible for providing the patient with their medications at time of release from the facility to include their pain medication, the muscle relaxants, and their blood thinner medication. If the patient is still at the rehab facility at time of the two week follow up appointment, the skilled rehab facility will also need to assist the patient in arranging follow up appointment in our office and any transportation needs. ° °MAKE SURE YOU:  °Understand these instructions.  °Get help right away if you are not doing well or get worse.  ° ° °Pick up stool softner and laxative for home use following surgery while on pain medications. °Do not submerge incision under water. °Please use good hand washing techniques while changing dressing each day. °May shower starting three days after surgery. °Please use a clean towel to pat the incision dry following  showers. °Continue to use ice for pain and swelling after surgery. °Do not use any lotions or creams on the incision until instructed by your surgeon. ° °Take Xarelto for two and a half more weeks following discharge from the hospital, then discontinue Xarelto. °Once the patient has completed the blood thinner regimen, then take a Baby 81 mg Aspirin daily for three   more weeks.    Information on my medicine - XARELTO (Rivaroxaban)  This medication education was reviewed with me or my healthcare representative as part of my discharge preparation.  The pharmacist that spoke with me during my hospital stay was:  Hershal Coria, [facility]  Why was Xarelto prescribed for you? Xarelto was prescribed for you to reduce the risk of blood clots forming after orthopedic surgery. The medical term for these abnormal blood clots is venous thromboembolism (VTE).  What do you need to know about xarelto ? Take your Xarelto ONCE DAILY at the same time every day. You may take it either with or without food.  If you have difficulty swallowing the tablet whole, you may crush it and mix in applesauce just prior to taking your dose.  Take Xarelto exactly as prescribed by your doctor and DO NOT stop taking Xarelto without talking to the doctor who prescribed the medication.  Stopping without other VTE prevention medication to take the place of Xarelto may increase your risk of developing a clot.  After discharge, you should have regular check-up appointments with your healthcare provider that is prescribing your Xarelto.    What do you do if you miss a dose? If you miss a dose, take it as soon as you remember on the same day then continue your regularly scheduled once daily regimen the next day. Do not take two doses of Xarelto on the same day.   Important Safety Information A possible side effect of Xarelto is bleeding. You should call your healthcare provider right away if you experience any of the  following: ? Bleeding from an injury or your nose that does not stop. ? Unusual colored urine (red or dark brown) or unusual colored stools (red or black). ? Unusual bruising for unknown reasons. ? A serious fall or if you hit your head (even if there is no bleeding).  Some medicines may interact with Xarelto and might increase your risk of bleeding while on Xarelto. To help avoid this, consult your healthcare provider or pharmacist prior to using any new prescription or non-prescription medications, including herbals, vitamins, non-steroidal anti-inflammatory drugs (NSAIDs) and supplements.  This website has more information on Xarelto: [email or web address]

## 2016-05-27 NOTE — Progress Notes (Signed)
   Subjective: 1 Day Post-Op Procedure(s) (LRB): RIGHT TOTAL KNEE ARTHROPLASTY (Right) Patient reports pain as mild.   Patient seen in rounds for Dr. Wynelle Link. Patient is well, but has had some minor complaints of pain in the knee, requiring pain medications We will start therapy today.  Plan is to go Home after hospital stay.  Objective: Vital signs in last 24 hours: Temp:  [97.4 F (36.3 C)-98.9 F (37.2 C)] 98.7 F (37.1 C) (02/13 0500) Pulse Rate:  [69-91] 79 (02/13 0500) Resp:  [7-18] 16 (02/13 0500) BP: (107-202)/(36-95) 159/60 (02/13 0500) SpO2:  [96 %-100 %] 99 % (02/13 0500) Weight:  [58.5 kg (129 lb)] 58.5 kg (129 lb) (02/12 1106)  Intake/Output from previous day:  Intake/Output Summary (Last 24 hours) at 05/27/16 0741 Last data filed at 05/27/16 0700  Gross per 24 hour  Intake           4422.5 ml  Output             1940 ml  Net           2482.5 ml    Intake/Output this shift: No intake/output data recorded.  Labs:  Recent Labs  05/27/16 0500  HGB 8.8*    Recent Labs  05/27/16 0500  WBC 12.5*  RBC 2.99*  HCT 25.8*  PLT 272    Recent Labs  05/27/16 0500  NA 134*  K 4.0  CL 102  CO2 26  BUN 15  CREATININE 0.87  GLUCOSE 137*  CALCIUM 8.4*   No results for input(s): LABPT, INR in the last 72 hours.  EXAM General - Patient is Alert, Appropriate and Oriented Extremity - Neurovascular intact Sensation intact distally Intact pulses distally Dorsiflexion/Plantar flexion intact Dressing - dressing C/D/I Motor Function - intact, moving foot and toes well on exam.  Hemovac pulled without difficulty.  Past Medical History:  Diagnosis Date  . Anemia    hx blood tranfusions  . Arthritis   . Diverticulosis   . DJD (degenerative joint disease) of cervical spine   . Hypertension     Assessment/Plan: 1 Day Post-Op Procedure(s) (LRB): RIGHT TOTAL KNEE ARTHROPLASTY (Right) Principal Problem:   OA (osteoarthritis) of knee  Estimated body  mass index is 23.59 kg/m as calculated from the following:   Height as of this encounter: 5\' 2"  (1.575 m).   Weight as of this encounter: 58.5 kg (129 lb). Advance diet Up with therapy Plan for discharge tomorrow Discharge home with home health  DVT Prophylaxis - Xarelto Weight-Bearing as tolerated to right leg D/C O2 and Pulse OX and try on Room Air  Elevated BP last night - Little better this morning but still elevated. Na+ 134 - likely dilutional - positive fluid volume postop - Take Hyzaar - will give lasix one dose this morning.  Arlee Muslim, PA-C Orthopaedic Surgery 05/27/2016, 7:41 AM

## 2016-05-27 NOTE — Evaluation (Signed)
Physical Therapy Evaluation Patient Details Name: Mallory Murphy MRN: [id number] DOB: 1946-01-01 Today's Date: 05/27/2016   History of Present Illness  s/p R TKA; PMHX: RA, HTN, bil THA  Clinical Impression  Pt is s/p TKA resulting in the deficits listed below (see PT Problem List). * Pt will benefit from skilled PT to increase their independence and safety with mobility to allow discharge to the venue listed below. Pt should progress well, has  DME, reports her niece is to help her after D/C but will not be staying with her; recommend HHPT unless pt has  transportation; will follow in acute setting     Follow Up Recommendations Home health PT    Equipment Recommendations  None recommended by PT    Recommendations for Other Services       Precautions / Restrictions Precautions Precautions: Fall;Knee Required Braces or Orthoses: Knee Immobilizer - Right Knee Immobilizer - Right: Discontinue once straight leg raise with < 10 degree lag (IND SLRs today, KI not utilized) Restrictions Weight Bearing Restrictions: No Other Position/Activity Restrictions: WBAT      Mobility  Bed Mobility Overal bed mobility: Needs Assistance Bed Mobility: Supine to Sit     Supine to sit: Min guard     General bed mobility comments: for RLE, cues for positioning, scooting/technique  Transfers Overall transfer level: Needs assistance Equipment used: Rolling walker (2 wheeled) Transfers: Sit to/from Stand Sit to Stand: Min guard         General transfer comment: cues for hand placement  Ambulation/Gait Ambulation/Gait assistance: Min guard Ambulation Distance (Feet): 40 Feet   Gait Pattern/deviations: Step-to pattern Gait velocity: decr   General Gait Details: cues for sequence  Stairs            Wheelchair Mobility    Modified Rankin (Stroke Patients Only)       Balance                                             Pertinent Vitals/Pain Pain  Assessment: 0-10 Pain Score: 2  Pain Location: right knee Pain Descriptors / Indicators: Discomfort;Sore Pain Intervention(s): Limited activity within patient's tolerance;Monitored during session;Premedicated before session;Repositioned    Home Living Family/patient expects to be discharged to:: Private residence Living Arrangements: Alone Available Help at Discharge: Family;Available PRN/intermittently (niece) Type of Home: House Home Access: Level entry     Home Layout: One level Home Equipment: Walker - 2 wheels;Bedside commode;Shower seat;Tub bench      Prior Function Level of Independence: Independent               Hand Dominance   Dominant Hand: Left (d/t RA, states she uses both hands)    Extremity/Trunk Assessment   Upper Extremity Assessment Upper Extremity Assessment: Defer to OT evaluation RUE Deficits / Details: fell on R shoulder  2-3 years ago has approximately 90 degrees abduction.  hand affected by arthritis LUE Deficits / Details: shoulder replacement, approximately 80 degrees abduction    Lower Extremity Assessment Lower Extremity Assessment: RLE deficits/detail RLE Deficits / Details: hip flexion and  knee extension grossly 2+/5, ankle WFL       Communication   Communication: No difficulties  Cognition Arousal/Alertness: Awake/alert Behavior During Therapy: WFL for tasks assessed/performed Overall Cognitive Status: Within Functional Limits for tasks assessed  General Comments      Exercises Total Joint Exercises Ankle Circles/Pumps: AROM;Both;10 reps Quad Sets: 10 reps;Both;AROM   Assessment/Plan    PT Assessment Patient needs continued PT services  PT Problem List Decreased activity tolerance;Decreased strength;Decreased range of motion;Decreased knowledge of use of DME;Pain;Decreased mobility          PT Treatment Interventions DME instruction;Gait training;Functional mobility training;Therapeutic  activities;Therapeutic exercise;Patient/family education    PT Goals (Current goals can be found in the Care Plan section)  Acute Rehab PT Goals Patient Stated Goal: home, be independent again PT Goal Formulation: With patient Time For Goal Achievement: 05/30/16 Potential to Achieve Goals: Good    Frequency 7X/week   Barriers to discharge        Co-evaluation               End of Session Equipment Utilized During Treatment: Gait belt Activity Tolerance: Patient tolerated treatment well Patient left: in chair;with call bell/phone within reach;with chair alarm set           Time: [id number] PT Time Calculation (min) (ACUTE ONLY): 23 min   Charges:   PT Evaluation $PT Eval Low Complexity: 1 Procedure PT Treatments $Gait Training: 8-22 mins   PT G Codes:        Zaya Kessenich 06-19-16, 10:31 AM

## 2016-05-27 NOTE — Progress Notes (Signed)
   05/27/16 1200  PT Visit Information  Last PT Received On 05/27/16  Assistance Needed +1  History of Present Illness s/p R TKA; PMHX: RA, HTN, bil THA  Subjective Data  Patient Stated Goal home, be independent again  Precautions  Precautions Fall;Knee  Precaution Comments independent SLRs  Required Braces or Orthoses Knee Immobilizer - Right  Knee Immobilizer - Right Discontinue once straight leg raise with < 10 degree lag  Restrictions  Other Position/Activity Restrictions WBAT  Pain Assessment  Pain Assessment 0-10  Pain Score 4  Pain Location right knee  Pain Descriptors / Indicators Discomfort;Sore  Pain Intervention(s) Limited activity within patient's tolerance;Monitored during session;Repositioned;Ice applied  Cognition  Arousal/Alertness Awake/alert  Behavior During Therapy WFL for tasks assessed/performed  Overall Cognitive Status Within Functional Limits for tasks assessed  Bed Mobility  Bed Mobility Sit to Supine  Sit to supine Min assist  General bed mobility comments assist for RLE, cues for positioning  Transfers  Overall transfer level Needs assistance  Equipment used Rolling walker (2 wheeled)  Transfers Sit to/from Stand  Sit to Stand Min guard  General transfer comment cues for hand placement and control of descent  Ambulation/Gait  Ambulation/Gait assistance Min guard  Ambulation Distance (Feet) 70 Feet (10' more)  Assistive device Rolling walker (2 wheeled)  Gait Pattern/deviations Step-to pattern  General Gait Details cues for sequence  Gait velocity decr  Total Joint Exercises  Ankle Circles/Pumps AROM;Both;10 reps  Quad Sets 10 reps;Both;AROM  Hip ABduction/ADduction AAROM;Right;10 reps  Heel Slides AAROM;Right;10 reps  Straight Leg Raises AROM;AAROM;Strengthening;Right;10 reps  PT - End of Session  Equipment Utilized During Treatment Gait belt  Activity Tolerance Patient tolerated treatment well  Patient left in bed;with call bell/phone  within reach;with bed alarm set;with family/visitor present  PT - Assessment/Plan  PT Plan Current plan remains appropriate  PT Frequency (ACUTE ONLY) 7X/week  Follow Up Recommendations Home health PT  PT equipment None recommended by PT  PT Goal Progression  Progress towards PT goals Progressing toward goals  Acute Rehab PT Goals  PT Goal Formulation With patient  Time For Goal Achievement 05/30/16  Potential to Achieve Goals Good  PT Time Calculation  PT Start Time (ACUTE ONLY) 1150  PT Stop Time (ACUTE ONLY) 1214  PT Time Calculation (min) (ACUTE ONLY) 24 min  PT General Charges  $$ ACUTE PT VISIT 1 Procedure  PT Treatments  $Gait Training 8-22 mins  $Therapeutic Exercise 8-22 mins

## 2016-05-27 NOTE — Care Management Note (Signed)
Case Management Note  Patient Details  Name: Mallory Murphy MRN: [id number] Date of Birth: 11-10-1945  Subjective/Objective:                  RIGHT TOTAL KNEE ARTHROPLASTY (Right) Action/Plan: Discharge planning Expected Discharge Date:                  Expected Discharge Plan:  [place]  In-House Referral:     Discharge planning Services  CM Consult  Post Acute Care Choice:  Home Health Choice offered to:  Patient  DME Arranged:  N/A DME Agency:  NA  HH Arranged:  PT [place] Agency:  [facility] ([facility]  Status of Service:  Completed, signed off  If discussed at [employer], dates discussed:    Additional Comments: CM met with pt in room to offer choice of home health agency. Pt chooses [facility] to render HHPT. Referral called to [facility] rep, Tim. Pt states she has all DME needed at home. NO other CM needs were communicated. Dellie Catholic, RN 05/27/2016, 2:53 PM

## 2016-05-27 NOTE — Evaluation (Signed)
Occupational Therapy Evaluation Patient Details Name: MATTISON FIGURSKI MRN: [id number] DOB: 06-15-1945 Today's Date: 05/27/2016    History of Present Illness s/p R TKA; PMHX: RA, HTN, bil THA   Clinical Impression   This 71 year old female was admitted for the above sx. She will benefit from continued OT to reinforce education with toileting.  Pt will have intermittent assistance at home from niece    Follow Up Recommendations  Supervision - Intermittent    Equipment Recommendations  None recommended by OT    Recommendations for Other Services       Precautions / Restrictions Precautions Precautions: Fall;Knee Required Braces or Orthoses: Knee Immobilizer - Right Knee Immobilizer - Right: Discontinue once straight leg raise with < 10 degree lag Restrictions Weight Bearing Restrictions: No Other Position/Activity Restrictions: WBAT      Mobility Bed Mobility Overal bed mobility: Needs Assistance Bed Mobility: Supine to Sit     Supine to sit: Min guard     General bed mobility comments: oob by PT  Transfers Overall transfer level: Needs assistance Equipment used: Rolling walker (2 wheeled) Transfers: Sit to/from Stand Sit to Stand: Min guard         General transfer comment: cues for hand placement    Balance                                            ADL Overall ADL's : Needs assistance/impaired     Grooming: Oral care;Set up;Sitting   Upper Body Bathing: Set up;Sitting   Lower Body Bathing: Minimal assistance;Sit to/from stand   Upper Body Dressing : Set up;Sitting   Lower Body Dressing: Moderate assistance;Sit to/from stand                 General ADL Comments: pt ambulated with PT; did not walk to bathroom as catheter is in place. Sat for grooming as it is easier on her shoulders.  She assisted her sister, who died last year, with showering using a tub bench:  she does not feel she needs to practice this.  Pt will  have her niece look for her reacher. She can get a long netted sponge on a stick for showering     Vision     Perception     Praxis      Pertinent Vitals/Pain Pain Assessment: 0-10 Pain Score: 2  Pain Location: right knee Pain Descriptors / Indicators: Discomfort;Sore Pain Intervention(s): Limited activity within patient's tolerance;Monitored during session;Premedicated before session;Repositioned     Hand Dominance Left (d/t RA, states she uses both hands)   Extremity/Trunk Assessment Upper Extremity Assessment Upper Extremity Assessment: pt has RA RUE Deficits / Details: fell on R shoulder  2-3 years ago has approximately 90 degrees abduction.  hand affected by arthritis LUE Deficits / Details: shoulder replacement, approximately 80 degrees abduction          Communication Communication Communication: No difficulties   Cognition Arousal/Alertness: Awake/alert Behavior During Therapy: WFL for tasks assessed/performed Overall Cognitive Status: Within Functional Limits for tasks assessed                     General Comments       Exercises       Shoulder Instructions      Home Living Family/patient expects to be discharged to:: Private residence Living Arrangements: Alone Available Help  at Discharge: Family;Available PRN/intermittently (niece) Type of Home: House Home Access: Level entry     Home Layout: One level     Bathroom Shower/Tub: Tub/shower unit Shower/tub characteristics: Curtain Biochemist, clinical: Standard     Home Equipment: Environmental consultant - 2 wheels;Bedside commode;Shower seat;Tub bench          Prior Functioning/Environment Level of Independence: Independent                 OT Problem List: Decreased strength;Decreased activity tolerance;Pain;Decreased knowledge of use of DME or AE   OT Treatment/Interventions: Self-care/ADL training;DME and/or AE instruction;Patient/family education    OT Goals(Current goals can be found in  the care plan section) Acute Rehab OT Goals Patient Stated Goal: home, be independent again OT Goal Formulation: With patient Time For Goal Achievement: 06/03/16 Potential to Achieve Goals: Good ADL Goals Pt Will Transfer to Toilet: with supervision;bedside commode;ambulating Pt Will Perform Toileting - Clothing Manipulation and hygiene: with supervision;sit to/from stand  OT Frequency: Min 2X/week   Barriers to D/C:            Co-evaluation              End of Session CPM Right Knee CPM Right Knee: Off  Activity Tolerance: Patient tolerated treatment well Patient left: in chair;with call bell/phone within reach;with chair alarm set   Time: [id number] OT Time Calculation (min): 21 min Charges:  OT General Charges $OT Visit: 1 Procedure OT Evaluation $OT Eval Low Complexity: 1 Procedure G-Codes:    Destinee Taber 06-10-2016, 11:05 AM  Lesle Chris, OTR/L [phone] 06-10-16

## 2016-05-27 NOTE — Discharge Summary (Signed)
Physician Discharge Summary   Patient ID: Mallory Murphy MRN: [id number] DOB/AGE: 1945/05/11 71 y.o.  Admit date: 05/26/2016 Discharge date: 05-28-2016  Primary Diagnosis:  Osteoarthritis  Right knee(s)  Admission Diagnoses:  Past Medical History:  Diagnosis Date  . Anemia    hx blood tranfusions  . Arthritis   . Diverticulosis   . DJD (degenerative joint disease) of cervical spine   . Hypertension    Discharge Diagnoses:   Principal Problem:   OA (osteoarthritis) of knee  Estimated body mass index is 23.59 kg/m as calculated from the following:   Height as of this encounter: 5' 2"  (1.575 m).   Weight as of this encounter: 58.5 kg (129 lb).  Procedure:  Procedure(s) (LRB): RIGHT TOTAL KNEE ARTHROPLASTY (Right)   Consults: None  HPI: Mallory Murphy is a 71 y.o. year old female with end stage OA of her right knee with progressively worsening pain and dysfunction. She has constant pain, with activity and at rest and significant functional deficits with difficulties even with ADLs. She has had extensive non-op management including analgesics, injections of cortisone, and home exercise program, but remains in significant pain with significant dysfunction.Radiographs show bone on bone arthritis all 3 compartments. She presents now for right Total Knee Arthroplasty.    Laboratory Data: Admission on 05/26/2016  Component Date Value Ref Range Status  . WBC 05/27/2016 12.5* 4.0 - 10.5 K/uL Final  . RBC 05/27/2016 2.99* 3.87 - 5.11 MIL/uL Final  . Hemoglobin 05/27/2016 8.8* 12.0 - 15.0 g/dL Final  . HCT 05/27/2016 25.8* 36.0 - 46.0 % Final  . MCV 05/27/2016 86.3  78.0 - 100.0 fL Final  . MCH 05/27/2016 29.4  26.0 - 34.0 pg Final  . MCHC 05/27/2016 34.1  30.0 - 36.0 g/dL Final  . RDW 05/27/2016 15.2  11.5 - 15.5 % Final  . Platelets 05/27/2016 272  150 - 400 K/uL Final  . Sodium 05/27/2016 134* 135 - 145 mmol/L Final  . Potassium 05/27/2016 4.0  3.5 - 5.1 mmol/L Final  .  Chloride 05/27/2016 102  101 - 111 mmol/L Final  . CO2 05/27/2016 26  22 - 32 mmol/L Final  . Glucose, Bld 05/27/2016 137* 65 - 99 mg/dL Final  . BUN 05/27/2016 15  6 - 20 mg/dL Final  . Creatinine, Ser 05/27/2016 0.87  0.44 - 1.00 mg/dL Final  . Calcium 05/27/2016 8.4* 8.9 - 10.3 mg/dL Final  . GFR calc non Af Amer 05/27/2016 >60  >60 mL/min Final  . GFR calc Af Amer 05/27/2016 >60  >60 mL/min Final   Comment: (NOTE) The eGFR has been calculated using the CKD EPI equation. This calculation has not been validated in all clinical situations. eGFR's persistently <60 mL/min signify possible Chronic Kidney Disease.   Georgiann Hahn gap 05/27/2016 6  5 - 15 Final  Hospital Outpatient Visit on 05/19/2016  Component Date Value Ref Range Status  . aPTT 05/19/2016 27  24 - 36 seconds Final  . WBC 05/19/2016 5.4  4.0 - 10.5 K/uL Final  . RBC 05/19/2016 3.24* 3.87 - 5.11 MIL/uL Final  . Hemoglobin 05/19/2016 9.5* 12.0 - 15.0 g/dL Final  . HCT 05/19/2016 28.0* 36.0 - 46.0 % Final  . MCV 05/19/2016 86.4  78.0 - 100.0 fL Final  . MCH 05/19/2016 29.3  26.0 - 34.0 pg Final  . MCHC 05/19/2016 33.9  30.0 - 36.0 g/dL Final  . RDW 05/19/2016 15.4  11.5 - 15.5 % Final  . Platelets 05/19/2016 305  150 - 400 K/uL Final  . Sodium 05/19/2016 138  135 - 145 mmol/L Final  . Potassium 05/19/2016 4.2  3.5 - 5.1 mmol/L Final  . Chloride 05/19/2016 104  101 - 111 mmol/L Final  . CO2 05/19/2016 30  22 - 32 mmol/L Final  . Glucose, Bld 05/19/2016 91  65 - 99 mg/dL Final  . BUN 05/19/2016 19  6 - 20 mg/dL Final  . Creatinine, Ser 05/19/2016 0.83  0.44 - 1.00 mg/dL Final  . Calcium 05/19/2016 9.2  8.9 - 10.3 mg/dL Final  . Total Protein 05/19/2016 7.1  6.5 - 8.1 g/dL Final  . Albumin 05/19/2016 4.0  3.5 - 5.0 g/dL Final  . AST 05/19/2016 24  15 - 41 U/L Final  . ALT 05/19/2016 12* 14 - 54 U/L Final  . Alkaline Phosphatase 05/19/2016 67  38 - 126 U/L Final  . Total Bilirubin 05/19/2016 0.2* 0.3 - 1.2 mg/dL Final  .  GFR calc non Af Amer 05/19/2016 >60  >60 mL/min Final  . GFR calc Af Amer 05/19/2016 >60  >60 mL/min Final   Comment: (NOTE) The eGFR has been calculated using the CKD EPI equation. This calculation has not been validated in all clinical situations. eGFR's persistently <60 mL/min signify possible Chronic Kidney Disease.   . Anion gap 05/19/2016 4* 5 - 15 Final  . Prothrombin Time 05/19/2016 12.9  11.4 - 15.2 seconds Final  . INR 05/19/2016 0.98   Final  . ABO/RH(D) 05/19/2016 O POS   Final  . Antibody Screen 05/19/2016 NEG   Final  . Sample Expiration 05/19/2016 05/29/2016   Final  . Extend sample reason 05/19/2016 NO TRANSFUSIONS OR PREGNANCY IN THE PAST 3 MONTHS   Final  . MRSA, PCR 05/19/2016 INVALID RESULTS, SPECIMEN SENT FOR CULTURE* NEGATIVE Final   Comment: RESULT CALLED TO, READ BACK BY AND VERIFIED WITH: AFTERHOURS   . Staphylococcus aureus 05/19/2016 INVALID RESULTS, SPECIMEN SENT FOR CULTURE* NEGATIVE Final   Comment: RESULT CALLED TO, READ BACK BY AND VERIFIED WITH: AFTERHOURS        The Xpert SA Assay (FDA approved for NASAL specimens in patients over 61 years of age), is one component of a comprehensive surveillance program.  Test performance has been validated by [facility] for patients greater than or equal to 16 year old. It is not intended to diagnose infection nor to guide or monitor treatment.   . ABO/RH(D) 05/19/2016 O POS   Final  . Specimen Description 05/19/2016 NOSE   Final  . Special Requests 05/19/2016 NONE   Final  . Culture 05/19/2016    Final                   Value:NO MRSA DETECTED Performed at [facility], [place] [address], [place], [place] [address]   . Report Status 05/19/2016 05/21/2016 FINAL   Final     X-Rays:No results found.  EKG: Orders placed or performed during the hospital encounter of 05/19/16  . EKG 12 lead  . EKG 12 lead     Hospital Course: Mallory Murphy is a 71 y.o. who was admitted to [facility]  They were brought to the operating room on 05/26/2016 and underwent Procedure(s): RIGHT TOTAL KNEE ARTHROPLASTY.  Patient tolerated the procedure well and was later transferred to the recovery room and then to the orthopaedic floor for postoperative care.  They were given PO and IV analgesics for pain control following their surgery.  They were  given 24 hours of postoperative antibiotics of  Anti-infectives    Start     Dose/Rate Route Frequency Ordered Stop   05/26/16 2000  ceFAZolin (ANCEF) IVPB 2g/100 mL premix     2 g 200 mL/hr over 30 Minutes Intravenous Every 6 hours 05/26/16 1713 05/27/16 0206   05/26/16 1046  ceFAZolin (ANCEF) IVPB 2g/100 mL premix     2 g 200 mL/hr over 30 Minutes Intravenous On call to O.R. 05/26/16 1046 05/26/16 1327     and started on DVT prophylaxis in the form of Xarelto.   PT and OT were ordered for total joint protocol.  Discharge planning consulted to help with postop disposition and equipment needs.  Patient had a tough night on the evening of surgery.  They started to get up OOB with therapy on day one. Hemovac drain was pulled without difficulty.  Continued to work with therapy into day two.  Dressing was changed on day two and the incision was healing well.  Patient was seen in rounds and was ready to go home.  Diet - Cardiac diet Follow up - in 2 weeks Activity - WBAT Disposition - Home Condition Upon Discharge - Good D/C Meds - See DC Summary DVT Prophylaxis - Xarelto  Discharge Instructions    Call MD / Call 911    Complete by:  As directed    If you experience chest pain or shortness of breath, CALL 911 and be transported to the hospital emergency room.  If you develope a fever above 101 F, pus (white drainage) or increased drainage or redness at the wound, or calf pain, call your surgeon's office.   Change dressing    Complete by:  As directed    Change dressing daily with sterile 4 x 4 inch gauze dressing and apply TED hose. Do not submerge the  incision under water.   Constipation Prevention    Complete by:  As directed    Drink plenty of fluids.  Prune juice may be helpful.  You may use a stool softener, such as Colace (over the counter) 100 mg twice a day.  Use MiraLax (over the counter) for constipation as needed.   Diet - low sodium heart healthy    Complete by:  As directed    Discharge instructions    Complete by:  As directed    Pick up stool softner and laxative for home use following surgery while on pain medications. Do not submerge incision under water. Please use good hand washing techniques while changing dressing each day. May shower starting three days after surgery. Please use a clean towel to pat the incision dry following showers. Continue to use ice for pain and swelling after surgery. Do not use any lotions or creams on the incision until instructed by your surgeon.  Wear both TED hose on both legs during the day every day for three weeks, but may have off at night at home.  Postoperative Constipation Protocol  Constipation - defined medically as fewer than three stools per week and severe constipation as less than one stool per week.  One of the most common issues patients have following surgery is constipation.  Even if you have a regular bowel pattern at home, your normal regimen is likely to be disrupted due to multiple reasons following surgery.  Combination of anesthesia, postoperative narcotics, change in appetite and fluid intake all can affect your bowels.  In order to avoid complications following surgery, here are some recommendations in  order to help you during your recovery period.  Colace (docusate) - Pick up an over-the-counter form of Colace or another stool softener and take twice a day as long as you are requiring postoperative pain medications.  Take with a full glass of water daily.  If you experience loose stools or diarrhea, hold the colace until you stool forms back up.  If your symptoms do  not get better within 1 week or if they get worse, check with your doctor.  Dulcolax (bisacodyl) - Pick up over-the-counter and take as directed by the product packaging as needed to assist with the movement of your bowels.  Take with a full glass of water.  Use this product as needed if not relieved by Colace only.   MiraLax (polyethylene glycol) - Pick up over-the-counter to have on hand.  MiraLax is a solution that will increase the amount of water in your bowels to assist with bowel movements.  Take as directed and can mix with a glass of water, juice, soda, coffee, or tea.  Take if you go more than two days without a movement. Do not use MiraLax more than once per day. Call your doctor if you are still constipated or irregular after using this medication for 7 days in a row.  If you continue to have problems with postoperative constipation, please contact the office for further assistance and recommendations.  If you experience "the worst abdominal pain ever" or develop nausea or vomiting, please contact the office immediatly for further recommendations for treatment.   Take Xarelto for two and a half more weeks, then discontinue Xarelto. Once the patient has completed the blood thinner regimen, then take a Baby 81 mg Aspirin daily for three more weeks.   Do not put a pillow under the knee. Place it under the heel.    Complete by:  As directed    Do not sit on low chairs, stoools or toilet seats, as it may be difficult to get up from low surfaces    Complete by:  As directed    Driving restrictions    Complete by:  As directed    No driving until released by the physician.   Increase activity slowly as tolerated    Complete by:  As directed    Lifting restrictions    Complete by:  As directed    No lifting until released by the physician.   Patient may shower    Complete by:  As directed    You may shower without a dressing once there is no drainage.  Do not wash over the wound.  If  drainage remains, do not shower until drainage stops.   TED hose    Complete by:  As directed    Use stockings (TED hose) for 3 weeks on both leg(s).  You may remove them at night for sleeping.   Weight bearing as tolerated    Complete by:  As directed    Laterality:  right   Extremity:  Lower     Allergies as of 05/27/2016      Reactions   Macrobid [nitrofurantoin Macrocrystal] Nausea Only      Medication List    STOP taking these medications   etanercept 50 MG/ML injection Commonly known as:  ENBREL   HYDROcodone-acetaminophen 5-325 MG tablet Commonly known as:  NORCO/VICODIN   ondansetron 8 MG disintegrating tablet Commonly known as:  ZOFRAN ODT     TAKE these medications   escitalopram 10 MG  tablet Commonly known as:  LEXAPRO Take 10 mg by mouth daily.   iron polysaccharides 150 MG capsule Commonly known as:  NIFEREX Take 1 capsule (150 mg total) by mouth 2 (two) times daily.   losartan-hydrochlorothiazide 50-12.5 MG tablet Commonly known as:  HYZAAR Take 1 tablet by mouth daily.   methocarbamol 500 MG tablet Commonly known as:  ROBAXIN Take 1 tablet (500 mg total) by mouth every 6 (six) hours as needed for muscle spasms.   metoprolol tartrate 25 MG tablet Commonly known as:  LOPRESSOR Take 1 tablet (25 mg total) by mouth 2 (two) times daily.   oxybutynin 5 MG 24 hr tablet Commonly known as:  DITROPAN-XL Take 5 mg by mouth at bedtime.   oxyCODONE 5 MG immediate release tablet Commonly known as:  Oxy IR/ROXICODONE Take 1-2 tablets (5-10 mg total) by mouth every 4 (four) hours as needed for moderate pain or severe pain.   rivaroxaban 10 MG Tabs tablet Commonly known as:  XARELTO Take 1 tablet (10 mg total) by mouth daily with breakfast. Take Xarelto for two and a half more weeks following discharge from the hospital, then discontinue Xarelto. Once the patient has completed the blood thinner regimen, then take a Baby 81 mg Aspirin daily for three more  weeks. Start taking on:  05/28/2016   traMADol 50 MG tablet Commonly known as:  ULTRAM Take 1-2 tablets (50-100 mg total) by mouth every 6 (six) hours as needed for moderate pain. What changed:  how much to take  when to take this  reasons to take this      Follow-up [place] Follow up.   Specialty:  Home Health Services Why:  home health physical therapy Contact information: [address] [place] [address] [phone]        [name], MD. Schedule an appointment as soon as possible for a visit on 06/10/2016.   Specialty:  Orthopedic Surgery Contact information: [address] [place] [address] [phone]           Signed: Arlee Muslim, PA-C Orthopaedic Surgery 05/27/2016, 9:35 PM

## 2016-05-28 ENCOUNTER — Encounter (HOSPITAL_COMMUNITY): Payer: Self-pay | Admitting: Orthopedic Surgery

## 2016-05-28 LAB — CBC
HEMATOCRIT: 24.7 % — AB (ref 36.0–46.0)
HEMOGLOBIN: 8.2 g/dL — AB (ref 12.0–15.0)
MCH: 28.6 pg (ref 26.0–34.0)
MCHC: 33.2 g/dL (ref 30.0–36.0)
MCV: 86.1 fL (ref 78.0–100.0)
Platelets: 274 10*3/uL (ref 150–400)
RBC: 2.87 MIL/uL — ABNORMAL LOW (ref 3.87–5.11)
RDW: 15.1 % (ref 11.5–15.5)
WBC: 16.4 10*3/uL — ABNORMAL HIGH (ref 4.0–10.5)

## 2016-05-28 LAB — BASIC METABOLIC PANEL
Anion gap: 7 (ref 5–15)
BUN: 18 mg/dL (ref 6–20)
CHLORIDE: 101 mmol/L (ref 101–111)
CO2: 31 mmol/L (ref 22–32)
CREATININE: 0.96 mg/dL (ref 0.44–1.00)
Calcium: 8.7 mg/dL — ABNORMAL LOW (ref 8.9–10.3)
GFR calc Af Amer: >60 mL/min (ref >60)
GFR calc non Af Amer: 59 mL/min — ABNORMAL LOW (ref >60)
GLUCOSE: 111 mg/dL — AB (ref 65–99)
Potassium: 3.3 mmol/L — ABNORMAL LOW (ref 3.5–5.1)
SODIUM: 139 mmol/L (ref 135–145)

## 2016-05-28 MED ORDER — POTASSIUM CHLORIDE CRYS ER 20 MEQ PO TBCR
40.0000 meq | EXTENDED_RELEASE_TABLET | Freq: Once | ORAL | Status: AC
  Administered 2016-05-28: 40 meq via ORAL
  Filled 2016-05-28: qty 2

## 2016-05-28 NOTE — Progress Notes (Signed)
Physical Therapy Treatment Patient Details Name: Mallory Murphy MRN: [id number] DOB: 02-12-1946 Today's Date: 05/28/2016    History of Present Illness s/p R TKA; PMHX: RA, HTN, bil THA    PT Comments    Pt with incr pain/soreness today, discussed healing process and normality of incr pain POD #2; pt continues to mobilize well, continue to recommend HHPT, pt reports her niece will assist as needed; ready for D/C from PT standpoint, RN aware  Follow Up Recommendations  Home health PT     Equipment Recommendations  None recommended by PT    Recommendations for Other Services       Precautions / Restrictions Precautions Precautions: Fall;Knee Precaution Comments: incr pain today, KI used for incr support Required Braces or Orthoses: Knee Immobilizer - Right Knee Immobilizer - Right: Discontinue once straight leg raise with < 10 degree lag Restrictions Weight Bearing Restrictions: No Other Position/Activity Restrictions: WBAT    Mobility  Bed Mobility   Bed Mobility: Supine to Sit;Sit to Supine     Supine to sit: Supervision Sit to supine: Min guard   General bed mobility comments: cues to self assist, instructed in use of belt for lifting RLE onto bed to incr independence  Transfers Overall transfer level: Needs assistance Equipment used: Rolling walker (2 wheeled) Transfers: Sit to/from Stand Sit to Stand: Min guard;Supervision         General transfer comment: cues for hand placement and control of descent  Ambulation/Gait Ambulation/Gait assistance: Min guard;Supervision Ambulation Distance (Feet): 30 Feet Assistive device: Rolling walker (2 wheeled) Gait Pattern/deviations: Step-to pattern;Antalgic Gait velocity: decr   General Gait Details: cues for sequence   Stairs            Wheelchair Mobility    Modified Rankin (Stroke Patients Only)       Balance                                    Cognition Arousal/Alertness:  Awake/alert Behavior During Therapy: WFL for tasks assessed/performed Overall Cognitive Status: Within Functional Limits for tasks assessed                      Exercises Total Joint Exercises Ankle Circles/Pumps: AROM;Both;10 reps Quad Sets: 10 reps;Both;AROM Heel Slides: AAROM;Right;10 reps Hip ABduction/ADduction: AAROM;Right;10 reps Straight Leg Raises: AROM;AAROM;Strengthening;Right;10 reps    General Comments        Pertinent Vitals/Pain Pain Assessment: 0-10 Pain Score: 5  Pain Location: right knee Pain Descriptors / Indicators: Discomfort;Sore Pain Intervention(s): Limited activity within patient's tolerance;Monitored during session;Premedicated before session;Repositioned;Ice applied    Home Living                      Prior Function            PT Goals (current goals can now be found in the care plan section) Acute Rehab PT Goals Patient Stated Goal: home, be independent again PT Goal Formulation: With patient Time For Goal Achievement: 05/30/16 Potential to Achieve Goals: Good Progress towards PT goals: Progressing toward goals    Frequency    7X/week      PT Plan Current plan remains appropriate    Co-evaluation             End of Session Equipment Utilized During Treatment: Gait belt Activity Tolerance: Patient tolerated treatment well;Patient limited by pain Patient left: in bed;with call  bell/phone within reach;with bed alarm set     Time: [phone] PT Time Calculation (min) (ACUTE ONLY): 30 min  Charges:  $Gait Training: 8-22 mins $Therapeutic Exercise: 8-22 mins                    G Codes:      Mallory Murphy Jun 11, 2016, 9:54 AM

## 2016-05-28 NOTE — Progress Notes (Signed)
OT Cancellation Note  Patient Details Name: Mallory Murphy MRN: [id number] DOB: Mar 12, 1946   Cancelled Treatment:    Reason Eval/Treat Not Completed: Other (comment).  Pt feels comfortable with ADLs and bathroom transfers. Will sign off.    Derron Pipkins 05/28/2016, 9:44 AM  Lesle Chris, OTR/L [phone] 05/28/2016

## 2016-05-28 NOTE — Progress Notes (Signed)
   Subjective: 2 Days Post-Op Procedure(s) (LRB): RIGHT TOTAL KNEE ARTHROPLASTY (Right) Patient reports pain as mild.   Patient seen in rounds with Dr. Wynelle Link. Patient is well, and has had no acute complaints or problems Patient is ready to go home  Objective: Vital signs in last 24 hours: Temp:  [98 F (36.7 C)-99.5 F (37.5 C)] 99.5 F (37.5 C) (02/14 0538) Pulse Rate:  [79-85] 84 (02/14 0538) Resp:  [16-18] 18 (02/14 0538) BP: (122-187)/(46-70) 187/70 (02/14 0538) SpO2:  [95 %-100 %] 97 % (02/14 0538)  Intake/Output from previous day:  Intake/Output Summary (Last 24 hours) at 05/28/16 0726 Last data filed at 05/28/16 0600  Gross per 24 hour  Intake             1080 ml  Output             2500 ml  Net            -1420 ml    Intake/Output this shift: No intake/output data recorded.  Labs:  Recent Labs  05/27/16 0500 05/28/16 0437  HGB 8.8* 8.2*    Recent Labs  05/27/16 0500 05/28/16 0437  WBC 12.5* 16.4*  RBC 2.99* 2.87*  HCT 25.8* 24.7*  PLT 272 274    Recent Labs  05/27/16 0500 05/28/16 0437  NA 134* 139  K 4.0 3.3*  CL 102 101  CO2 26 31  BUN 15 18  CREATININE 0.87 0.96  GLUCOSE 137* 111*  CALCIUM 8.4* 8.7*   No results for input(s): LABPT, INR in the last 72 hours.  EXAM: General - Patient is Alert, Appropriate and Oriented Extremity - Neurovascular intact Sensation intact distally Incision - clean, dry, no drainage Motor Function - intact, moving foot and toes well on exam.   Assessment/Plan: 2 Days Post-Op Procedure(s) (LRB): RIGHT TOTAL KNEE ARTHROPLASTY (Right) Procedure(s) (LRB): RIGHT TOTAL KNEE ARTHROPLASTY (Right) Past Medical History:  Diagnosis Date  . Anemia    hx blood tranfusions  . Arthritis   . Diverticulosis   . DJD (degenerative joint disease) of cervical spine   . Hypertension    Principal Problem:   OA (osteoarthritis) of knee  Estimated body mass index is 23.59 kg/m as calculated from the following:   Height as of this encounter: 5\' 2"  (1.575 m).   Weight as of this encounter: 58.5 kg (129 lb). Up with therapy Diet - Cardiac diet Follow up - in 2 weeks Activity - WBAT Disposition - Home Condition Upon Discharge - Good D/C Meds - See DC Summary DVT Prophylaxis - [place], PA-C Orthopaedic Surgery 05/28/2016, 7:26 AM

## 2016-06-09 ENCOUNTER — Ambulatory Visit: Payer: Medicare Other | Attending: Orthopedic Surgery | Admitting: Physical Therapy

## 2016-06-09 DIAGNOSIS — R262 Difficulty in walking, not elsewhere classified: Secondary | ICD-10-CM | POA: Diagnosis present

## 2016-06-09 DIAGNOSIS — M25561 Pain in right knee: Secondary | ICD-10-CM | POA: Diagnosis present

## 2016-06-09 DIAGNOSIS — M25661 Stiffness of right knee, not elsewhere classified: Secondary | ICD-10-CM

## 2016-06-09 DIAGNOSIS — R2689 Other abnormalities of gait and mobility: Secondary | ICD-10-CM | POA: Insufficient documentation

## 2016-06-09 DIAGNOSIS — M6281 Muscle weakness (generalized): Secondary | ICD-10-CM | POA: Diagnosis present

## 2016-06-09 NOTE — Patient Instructions (Signed)
Quad Set    With other leg bent, foot flat, slowly tighten muscles on thigh of straight leg while counting out loud to _5___.  Repeat __15__ times. Do _2-3___ sessions per day.    Straight Leg Raise    Tighten stomach and slowly raise locked right leg __6__ inches from floor. Repeat __15__ times per set.  Do _2-3___ sessions per day.    Bridge    Lie back, legs bent. Inhale, pressing hips up. Keeping ribs in, lengthen lower back. Exhale, rolling down along spine from top. Repeat __15__ times. Do _2___ sessions per day.    Long [employer]    Straighten operated leg and try to hold it __5__ seconds. Use _0___ lbs on ankle. Repeat __15__ times. Do _2___ sessions a day.

## 2016-06-09 NOTE — Therapy (Signed)
[facility] [address]  [place], [place], [address] Phone: [phone]   Fax:  [phone]  Physical Therapy Evaluation  Patient Details  Name: Mallory Murphy MRN: [id number] Date of Birth: 1945/09/22 Referring Provider: Dr. Gaynelle Arabian  Encounter Date: 06/09/2016      PT End of Session - 06/09/16 1515    Visit Number 1   Number of Visits 10   Date for PT Re-Evaluation 07/07/16   Authorization Type Medicare   PT Start Time 1315   PT Stop Time 1414   PT Time Calculation (min) 59 min   Activity Tolerance Patient tolerated treatment well   Behavior During Therapy [facility] for tasks assessed/performed      Past Medical History:  Diagnosis Date  . Anemia    hx blood tranfusions  . Arthritis   . Diverticulosis   . DJD (degenerative joint disease) of cervical spine   . Hypertension     Past Surgical History:  Procedure Laterality Date  . ELBOW ARTHROPLASTY Right    X3   . JOINT REPLACEMENT     both hips  . 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 TOTAL SHOULDER ARTHROPLASTY Left     There were no vitals filed for this visit.       Subjective Assessment - 06/09/16 1317    Subjective Patient reporing a history of RA - elective R TKA. Tried conservative treatment prior to surgery with little relief. Slight numbness of anterior knee joint. Had HHPT for 2 weeks (5 visits total). Currently walking with rolling walker. Does ice at home regularly. Does take medication regularly.   Limitations Standing;Walking   Patient Stated Goals regain mobility (walking)   Currently in Pain? Yes   Pain Score 4    Pain Location Knee   Pain Orientation Right   Pain Descriptors / Indicators Aching;Tightness   Pain Type Surgical pain   Pain Onset 1 to 4 weeks ago   Pain Frequency  Intermittent            [facility] PT Assessment - 06/09/16 1323      Assessment   Medical Diagnosis R TKA   Referring Provider Dr. Gaynelle Arabian   Onset Date/Surgical Date 05/26/16   Next MD Visit 06/10/16   Prior Therapy yes     Precautions   Precautions None     Restrictions   Weight Bearing Restrictions No     Balance Screen   Has the patient fallen in the past 6 months No   Has the patient had a decrease in activity level because of a fear of falling?  Yes   Is the patient reluctant to leave their home because of a fear of falling?  No     Home Environment   Living Environment Private residence   Living Arrangements Alone   Available Help at Discharge Family   Type of [place] Access Level entry   [place] - 2 wheels     Prior Function   Level of [place] Retired   Leisure read, family     Cognition   Overall Cognitive Status Within Functional Limits for tasks assessed     Observation/Other Assessments   Focus on Therapeutic Outcomes (FOTO)  Knee: 34 (66%  limited, predicted 44% limited)     Coordination   Gross Motor Movements are Fluid and Coordinated Yes     ROM / Strength   AROM / PROM / Strength AROM;PROM;Strength     AROM   AROM Assessment Site Knee   Right/Left Knee Right;Left   Right Knee Extension 6   Right Knee Flexion 101   Left Knee Extension -2   Left Knee Flexion 140     PROM   PROM Assessment Site Knee   Right/Left Knee Right   Right Knee Extension 4   Right Knee Flexion 112     Strength   Strength Assessment Site Hip;Knee   Right/Left Hip Right;Left   Right Hip Flexion 3+/5   Left Hip Flexion 4/5   Right/Left Knee Right;Left   Right Knee Flexion 3+/5   Right Knee Extension 3+/5   Left Knee Flexion 4+/5   Left Knee Extension 4+/5     Ambulation/Gait   Ambulation/Gait Yes   Ambulation/Gait Assistance 4: Min guard   Ambulation Distance (Feet) 200 Feet    Assistive device Rolling walker   Gait Pattern Step-through pattern;Decreased step length - left;Decreased stance time - right;Decreased hip/knee flexion - right;Decreased weight shift to right;Antalgic   Ambulation Surface Level;Indoor   Gait velocity 1.97 feet/sec with RW                   [facility] Adult PT Treatment/Exercise - 06/09/16 1323      Exercises   Exercises Knee/Hip     Knee/Hip Exercises: Stretches   Passive Hamstring Stretch Limitations education for foot propped on table/ottoman throughout day     Knee/Hip Exercises: Seated   Long Arc Quad AROM;Right;10 reps     Knee/Hip Exercises: Supine   Quad Sets Right;15 reps   Quad Sets Limitations heel propped on 1/2 foam roll   Bridges 10 reps   Straight Leg Raises AROM;Right;15 reps   Straight Leg Raises Limitations visible extensor lag     Modalities   Modalities Vasopneumatic     Vasopneumatic   Number Minutes Vasopneumatic  15 minutes   Vasopnuematic Location  Knee   Vasopneumatic Pressure Medium   Vasopneumatic Temperature  coldest temp                PT Education - 06/09/16 1438    Education provided Yes   Education Details exam findings, POC, HEP   Person(s) Educated Patient   Methods Explanation;Demonstration;Handout   Comprehension Verbalized understanding;Returned demonstration          PT Short Term Goals - 06/09/16 1525      PT SHORT TERM GOAL #1   Title patient to be independent wiht HEP (06/23/16)   Status New     PT SHORT TERM GOAL #2   Title patient to improve R knee PROM to 0-120 (06/23/16)   Status New           PT Long Term Goals - 06/09/16 1524      PT LONG TERM GOAL #1   Title patient to be independent with advanced HEP (07/07/16)   Status New     PT LONG TERM GOAL #2   Title patient to improve R knee AROM to 0-120 (07/07/16)   Status New     PT LONG TERM GOAL #3   Title patient to demonstrate appropriate gait mechanics with good heel toe gait pattern with  no AD (07/07/16)   Status New     PT LONG TERM  GOAL #4   Title Patient to demonstrate no extensor lag with SLR (07/07/16)   Status New               Plan - 06/09/16 1516    Clinical Impression Statement Patient is a 71 y/o female presenting to [facility] today s/p elective R TKA on 05/26/16. Patient today ambulating with rolling walker with poor gait mechanics (as listed above), reduced AROM and PROM of R knee, as well as reduced balance and strength of R LE. Patient has been seen by HHPT for approx 5 visits with patient comfotable navigating her home environment, however does have family in-home 1x/day for bathing. Patient to benefit from PT to address the above listed deficits to promote improved functional mobility.    Rehab Potential Good   PT Frequency 3x / week  3x/week - 2 weeks; taper to 2x/week for 2 weeks   PT Duration 4 weeks   PT Treatment/Interventions ADLs/Self Care Home Management;Cryotherapy;Electrical Stimulation;Moist Heat;Ultrasound;Neuromuscular re-education;Balance training;Therapeutic exercise;Therapeutic activities;Stair training;Gait training;Functional mobility training;Patient/family education;Manual techniques;Passive range of motion;Vasopneumatic Device;Taping;Dry needling   PT Next Visit Plan progress R knee ROM and strength, gait training   Consulted and Agree with Plan of Care Patient      Patient will benefit from skilled therapeutic intervention in order to improve the following deficits and impairments:  Abnormal gait, Decreased activity tolerance, Decreased balance, Decreased range of motion, Decreased mobility, Decreased strength, Difficulty walking, Pain, Increased edema  Visit Diagnosis: Acute pain of right knee - Plan: PT plan of care cert/re-cert  Stiffness of right knee, not elsewhere classified - Plan: PT plan of care cert/re-cert  Difficulty in walking, not elsewhere classified - Plan: PT plan of care cert/re-cert  Other abnormalities of gait and  mobility - Plan: PT plan of care cert/re-cert  Muscle weakness (generalized) - Plan: PT plan of care cert/re-cert      G-Codes - [id number]    Functional Assessment Tool Used (Outpatient Only) FOTO: knee 34 (66% limited)   Functional Limitation Mobility: Walking and moving around   Mobility: Walking and Moving Around Current Status [id number]) At least 60 percent but less than 80 percent impaired, limited or restricted   Mobility: Walking and Moving Around Goal Status [phone]) At least 40 percent but less than 60 percent impaired, limited or restricted       Problem List Patient Active Problem List   Diagnosis Date Noted  . OA (osteoarthritis) of knee 05/26/2016  . AKI (acute kidney injury) ([place]) 06/21/2014  . Dehydration 06/21/2014  . Syncope and collapse 06/21/2014  . Hypokalemia 06/21/2014  . Palpitation 06/21/2014  . HTN (hypertension) 05/07/2012  . Arthritis, rheumatoid ([place]) 05/07/2012     Lanney Gins, PT, DPT 06/09/16 3:33 PM   [facility] [address]  [place], [place], [address] Phone: [phone]   Fax:  [phone]  Name: Mallory Murphy MRN: [id number] Date of Birth: 01-31-1946

## 2016-06-11 ENCOUNTER — Ambulatory Visit: Payer: Medicare Other | Admitting: Rehabilitative and Restorative Service Providers"

## 2016-06-11 ENCOUNTER — Encounter: Payer: Self-pay | Admitting: Rehabilitative and Restorative Service Providers"

## 2016-06-11 DIAGNOSIS — M6281 Muscle weakness (generalized): Secondary | ICD-10-CM

## 2016-06-11 DIAGNOSIS — R262 Difficulty in walking, not elsewhere classified: Secondary | ICD-10-CM

## 2016-06-11 DIAGNOSIS — R2689 Other abnormalities of gait and mobility: Secondary | ICD-10-CM

## 2016-06-11 DIAGNOSIS — M25561 Pain in right knee: Secondary | ICD-10-CM

## 2016-06-11 DIAGNOSIS — M25661 Stiffness of right knee, not elsewhere classified: Secondary | ICD-10-CM

## 2016-06-11 NOTE — Patient Instructions (Addendum)
SIT TO STAND: No Device    Sit with feet shoulder-width apart, on floor. Lean chest forward, raise hips up from surface. Straighten hips and knees. Weight bear equally on left and right sides. __5_ reps per set, _2-3_ sets per day. Lower to sit VERY VERY slowly!!   Strengthening: Hip Abductor - Resisted    With band looped around both legs above knees, push thighs apart. Hold still with one leg while you bring the opposite knee out. Repeat to both sides.  Repeat _10___ times per set. Do _2-3__ sets per session. Do _1-2___ sessions per day.

## 2016-06-11 NOTE — Therapy (Signed)
[facility] [address]  [place], [place], [address] Phone: [phone]   Fax:  [phone]  Physical Therapy Treatment  Patient Details  Name: Mallory Murphy MRN: [id number] Date of Birth: 1945-05-06 Referring Provider: Dr. Gaynelle Arabian  Encounter Date: 06/11/2016      PT End of Session - 06/11/16 1025    Visit Number 2   Number of Visits 10   Date for PT Re-Evaluation 07/07/16   Authorization Type Medicare   PT Start Time 1020   PT Stop Time 1100   PT Time Calculation (min) 40 min   Activity Tolerance Patient tolerated treatment well      Past Medical History:  Diagnosis Date  . Anemia    hx blood tranfusions  . Arthritis   . Diverticulosis   . DJD (degenerative joint disease) of cervical spine   . Hypertension     Past Surgical History:  Procedure Laterality Date  . ELBOW ARTHROPLASTY Right    X3   . JOINT REPLACEMENT     both hips  . 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 TOTAL SHOULDER ARTHROPLASTY Left     There were no vitals filed for this visit.      Subjective Assessment - 06/11/16 1023    Subjective Awoke with more pain this am nauseated and having digestive problems - Saw MD yesterday and he feels she is "three weeks ahead of schedule"   Currently in Pain? Yes   Pain Score 5    Pain Location Knee   Pain Orientation Right   Pain Descriptors / Indicators Aching;Tightness   Pain Type Surgical pain   Pain Onset 1 to 4 weeks ago   Pain Frequency Intermittent                         [facility] Adult PT Treatment/Exercise - 06/11/16 0001      Ambulation/Gait   Ambulation/Gait Yes   Ambulation/Gait Assistance 5: Supervision   Ambulation Distance (Feet) 200 Feet   Assistive device Rolling walker   Gait Pattern Step-through  pattern;Decreased step length - left;Decreased stance time - right;Decreased hip/knee flexion - right;Decreased weight shift to right;Antalgic   Ambulation Surface Level;Indoor     Knee/Hip Exercises: Standing   Other Standing Knee Exercises working on standing posture and alignment - focus on straightening hips and knees without and with walker      Knee/Hip Exercises: Seated   Heel Slides AROM;Right;5 reps   Sit to Sand 5 reps  working on sit > stand; stand > sit mid ranges w/ holds      Knee/Hip Exercises: Supine   Quad Sets Right;15 reps   Quad Sets Limitations heel propped on foam roll   Heel Slides AROM;10 reps   Bridges 10 reps   Straight Leg Raises AROM;Right;15 reps   Other Supine Knee/Hip Exercises clams red TB - alt hip abd x 10 x 2 sets each LE      Modalities   Modalities --  declined modalities d/t digestive problems      Manual Therapy   Passive ROM assisted extension knee supported on foam roll; flexion in supine x 5 each                 PT Education - 06/11/16 1100  Education provided Yes   Education Details HEP   Person(s) Educated Patient   Methods Explanation;Demonstration;Tactile cues;Verbal cues;Handout   Comprehension Verbalized understanding;Returned demonstration;Verbal cues required;Tactile cues required          PT Short Term Goals - 06/11/16 1027      PT SHORT TERM GOAL #1   Title patient to be independent wiht HEP (06/23/16)   Status On-going     PT SHORT TERM GOAL #2   Title patient to improve R knee PROM to 0-120 (06/23/16)   Status On-going           PT Long Term Goals - 06/11/16 1027      PT LONG TERM GOAL #1   Title patient to be independent with advanced HEP (07/07/16)   Status On-going     PT LONG TERM GOAL #2   Title patient to improve R knee AROM to 0-120 (07/07/16)   Status On-going     PT LONG TERM GOAL #3   Title patient to demonstrate appropriate gait mechanics with good heel toe gait pattern with no AD  (07/07/16)   Status On-going     PT LONG TERM GOAL #4   Title Patient to demonstrate no extensor lag with SLR (07/07/16)   Status On-going               Plan - 06/11/16 1028    Clinical Impression Statement Patient preformed exercise with brief periods for rest. Working on ROM/strength. Digestive problems limited exercise tolerance today.    Rehab Potential Good   PT Frequency 3x / week   PT Duration 4 weeks   PT Treatment/Interventions ADLs/Self Care Home Management;Cryotherapy;Electrical Stimulation;Moist Heat;Ultrasound;Neuromuscular re-education;Balance training;Therapeutic exercise;Therapeutic activities;Stair training;Gait training;Functional mobility training;Patient/family education;Manual techniques;Passive range of motion;Vasopneumatic Device;Taping;Dry needling   PT Next Visit Plan progress R knee ROM and strength, gait training   Consulted and Agree with Plan of Care Patient      Patient will benefit from skilled therapeutic intervention in order to improve the following deficits and impairments:  Abnormal gait, Decreased activity tolerance, Decreased balance, Decreased range of motion, Decreased mobility, Decreased strength, Difficulty walking, Pain, Increased edema  Visit Diagnosis: Acute pain of right knee  Stiffness of right knee, not elsewhere classified  Difficulty in walking, not elsewhere classified  Other abnormalities of gait and mobility  Muscle weakness (generalized)     Problem List Patient Active Problem List   Diagnosis Date Noted  . OA (osteoarthritis) of knee 05/26/2016  . AKI (acute kidney injury) ([place]) 06/21/2014  . Dehydration 06/21/2014  . Syncope and collapse 06/21/2014  . Hypokalemia 06/21/2014  . Palpitation 06/21/2014  . HTN (hypertension) 05/07/2012  . Arthritis, rheumatoid ([place]) 05/07/2012    Jaiyana Canale Nilda Simmer PT, MPH  06/11/2016, 12:34 PM  [facility] [address]   [place], [place], [address] Phone: [phone]   Fax:  [phone]  Name: Mallory Murphy MRN: [id number] Date of Birth: 04/05/1946

## 2016-06-12 ENCOUNTER — Ambulatory Visit: Payer: Medicare Other

## 2016-06-16 ENCOUNTER — Ambulatory Visit: Payer: Medicare Other | Attending: Orthopedic Surgery

## 2016-06-16 DIAGNOSIS — M25561 Pain in right knee: Secondary | ICD-10-CM | POA: Diagnosis present

## 2016-06-16 DIAGNOSIS — R262 Difficulty in walking, not elsewhere classified: Secondary | ICD-10-CM | POA: Diagnosis present

## 2016-06-16 DIAGNOSIS — M25661 Stiffness of right knee, not elsewhere classified: Secondary | ICD-10-CM | POA: Diagnosis present

## 2016-06-16 DIAGNOSIS — R2689 Other abnormalities of gait and mobility: Secondary | ICD-10-CM | POA: Diagnosis present

## 2016-06-16 DIAGNOSIS — M6281 Muscle weakness (generalized): Secondary | ICD-10-CM | POA: Insufficient documentation

## 2016-06-16 NOTE — Therapy (Signed)
[facility] [address]  [place], [place], [address] Phone: [phone]   Fax:  [phone]  Physical Therapy Treatment  Patient Details  Name: Mallory Murphy MRN: [id number] Date of Birth: February 14, 1946 Referring Provider: Dr. Gaynelle Arabian  Encounter Date: 06/16/2016      PT End of Session - 06/16/16 1339    Visit Number 3   Number of Visits 10   Date for PT Re-Evaluation 07/07/16   Authorization Type Medicare   PT Start Time [id number]  pt. arriving late    PT Stop Time 1410   PT Time Calculation (min) 33 min   Activity Tolerance Patient tolerated treatment well      Past Medical History:  Diagnosis Date  . Anemia    hx blood tranfusions  . Arthritis   . Diverticulosis   . DJD (degenerative joint disease) of cervical spine   . Hypertension     Past Surgical History:  Procedure Laterality Date  . ELBOW ARTHROPLASTY Right    X3   . JOINT REPLACEMENT     both hips  . 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 TOTAL SHOULDER ARTHROPLASTY Left     There were no vitals filed for this visit.      Subjective Assessment - 06/16/16 1338    Currently in Pain? Yes   Pain Score 3    Pain Location Knee   Pain Orientation Right   Pain Descriptors / Indicators Aching   Pain Type Surgical pain   Pain Onset 1 to 4 weeks ago   Pain Frequency Constant   Multiple Pain Sites No           [facility] Adult PT Treatment/Exercise - 06/16/16 1403      Ambulation/Gait   Ambulation/Gait Yes   Ambulation/Gait Assistance 5: Supervision   Ambulation Distance (Feet) 200 Feet   Assistive device Straight cane   Gait Pattern Step-through pattern;Decreased step length - left;Decreased stance time - right;Decreased hip/knee flexion - right;Decreased weight shift to right;Antalgic   Ambulation  Surface Level;Indoor   Gait Comments Cues for proper sequencing with SPC, upright posture, and increased step length with L LE      Knee/Hip Exercises: Aerobic   Recumbent Bike Lvl 1, 3 min      Knee/Hip Exercises: Seated   Sit to Sand 10 reps;2 sets  sit<>stand with R wt. shift 2 x 10 reps; 1 UE pushoff     Knee/Hip Exercises: Supine   Other Supine Knee/Hip Exercises clams red TB - alt hip abd x 10 reps each     Vasopneumatic   Number Minutes Vasopneumatic  10 minutes   Vasopnuematic Location  Knee   Vasopneumatic Pressure Medium   Vasopneumatic Temperature  coldest temp           PT Short Term Goals - 06/11/16 1027      PT SHORT TERM GOAL #1   Title patient to be independent wiht HEP (06/23/16)   Status On-going     PT SHORT TERM GOAL #2   Title patient to improve R knee PROM to 0-120 (06/23/16)   Status On-going           PT Long Term Goals - 06/11/16 1027      PT LONG TERM GOAL #1   Title patient to be independent with  advanced HEP (07/07/16)   Status On-going     PT LONG TERM GOAL #2   Title patient to improve R knee AROM to 0-120 (07/07/16)   Status On-going     PT LONG TERM GOAL #3   Title patient to demonstrate appropriate gait mechanics with good heel toe gait pattern with no AD (07/07/16)   Status On-going     PT LONG TERM GOAL #4   Title Patient to demonstrate no extensor lag with SLR (07/07/16)   Status On-going           Plan - 06/16/16 1346    Clinical Impression Statement Pt. arriving late today thus treatment cut short.  Pt. seen with rollator however gait training with SPC was focus of today visit.  Pt. able to ambulate x 200 ft with SPC requiring cues for increased stance time on R, increased step length, and proper sequencing throughout.  Only mild pain increase with gait training today.  HEP reviewed with pt. today with pt. reporting limited performance over weekend.  Ice/compression to end treatment today to promote reduction in  post-exercise swelling and pain.  Pt. will continue to benefit from further skilled therapy to improve gait mechanics, improve LE strength, ROM, and return to function.       PT Treatment/Interventions ADLs/Self Care Home Management;Cryotherapy;Electrical Stimulation;Moist Heat;Ultrasound;Neuromuscular re-education;Balance training;Therapeutic exercise;Therapeutic activities;Stair training;Gait training;Functional mobility training;Patient/family education;Manual techniques;Passive range of motion;Vasopneumatic Device;Taping;Dry needling   PT Next Visit Plan progress R knee ROM and strength, gait training      Patient will benefit from skilled therapeutic intervention in order to improve the following deficits and impairments:  Abnormal gait, Decreased activity tolerance, Decreased balance, Decreased range of motion, Decreased mobility, Decreased strength, Difficulty walking, Pain, Increased edema  Visit Diagnosis: Acute pain of right knee  Stiffness of right knee, not elsewhere classified  Difficulty in walking, not elsewhere classified  Other abnormalities of gait and mobility  Muscle weakness (generalized)     Problem List Patient Active Problem List   Diagnosis Date Noted  . OA (osteoarthritis) of knee 05/26/2016  . AKI (acute kidney injury) ([place]) 06/21/2014  . Dehydration 06/21/2014  . Syncope and collapse 06/21/2014  . Hypokalemia 06/21/2014  . Palpitation 06/21/2014  . HTN (hypertension) 05/07/2012  . Arthritis, rheumatoid ([place]) 05/07/2012    Bess Harvest, PTA 06/16/16 5:11 PM  [facility] [address]  [place], [place], [address] Phone: [phone]   Fax:  [phone]  Name: MATHA HANDLIN MRN: [id number] Date of Birth: 07-31-1945

## 2016-06-17 ENCOUNTER — Encounter: Payer: Medicare Other | Admitting: Physical Therapy

## 2016-06-18 ENCOUNTER — Ambulatory Visit: Payer: Medicare Other | Admitting: Physical Therapy

## 2016-06-20 ENCOUNTER — Emergency Department (HOSPITAL_BASED_OUTPATIENT_CLINIC_OR_DEPARTMENT_OTHER): Payer: Medicare Other

## 2016-06-20 ENCOUNTER — Encounter (HOSPITAL_BASED_OUTPATIENT_CLINIC_OR_DEPARTMENT_OTHER): Payer: Self-pay | Admitting: *Deleted

## 2016-06-20 ENCOUNTER — Emergency Department (HOSPITAL_BASED_OUTPATIENT_CLINIC_OR_DEPARTMENT_OTHER)
Admission: EM | Admit: 2016-06-20 | Discharge: 2016-06-20 | Disposition: A | Discharge status: Home / Self Care | Payer: Medicare Other | Attending: Emergency Medicine | Admitting: Emergency Medicine

## 2016-06-20 ENCOUNTER — Ambulatory Visit: Payer: Medicare Other | Admitting: Physical Therapy

## 2016-06-20 DIAGNOSIS — N289 Disorder of kidney and ureter, unspecified: Secondary | ICD-10-CM | POA: Insufficient documentation

## 2016-06-20 DIAGNOSIS — T465X5A Adverse effect of other antihypertensive drugs, initial encounter: Secondary | ICD-10-CM | POA: Diagnosis not present

## 2016-06-20 DIAGNOSIS — Y829 Unspecified medical devices associated with adverse incidents: Secondary | ICD-10-CM | POA: Insufficient documentation

## 2016-06-20 DIAGNOSIS — Z79899 Other long term (current) drug therapy: Secondary | ICD-10-CM | POA: Insufficient documentation

## 2016-06-20 DIAGNOSIS — E860 Dehydration: Secondary | ICD-10-CM | POA: Diagnosis not present

## 2016-06-20 DIAGNOSIS — R931 Abnormal findings on diagnostic imaging of heart and coronary circulation: Secondary | ICD-10-CM | POA: Diagnosis not present

## 2016-06-20 DIAGNOSIS — I952 Hypotension due to drugs: Secondary | ICD-10-CM | POA: Diagnosis not present

## 2016-06-20 DIAGNOSIS — T887XXA Unspecified adverse effect of drug or medicament, initial encounter: Secondary | ICD-10-CM | POA: Diagnosis not present

## 2016-06-20 DIAGNOSIS — R531 Weakness: Secondary | ICD-10-CM | POA: Diagnosis present

## 2016-06-20 LAB — CBC WITH DIFFERENTIAL/PLATELET
BASOS PCT: 0 %
Basophils Absolute: 0 10*3/uL (ref 0.0–0.1)
EOS ABS: 0.2 10*3/uL (ref 0.0–0.7)
Eosinophils Relative: 3 %
HCT: 28.2 % — ABNORMAL LOW (ref 36.0–46.0)
Hemoglobin: 9.1 g/dL — ABNORMAL LOW (ref 12.0–15.0)
LYMPHS ABS: 2.3 10*3/uL (ref 0.7–4.0)
Lymphocytes Relative: 32 %
MCH: 28.2 pg (ref 26.0–34.0)
MCHC: 32.3 g/dL (ref 30.0–36.0)
MCV: 87.3 fL (ref 78.0–100.0)
MONOS PCT: 11 %
Monocytes Absolute: 0.8 10*3/uL (ref 0.1–1.0)
NEUTROS PCT: 54 %
Neutro Abs: 3.9 10*3/uL (ref 1.7–7.7)
PLATELETS: 443 10*3/uL — AB (ref 150–400)
RBC: 3.23 MIL/uL — ABNORMAL LOW (ref 3.87–5.11)
RDW: 13.7 % (ref 11.5–15.5)
WBC: 7.2 10*3/uL (ref 4.0–10.5)

## 2016-06-20 LAB — BASIC METABOLIC PANEL
Anion gap: 12 (ref 5–15)
BUN: 22 mg/dL — AB (ref 6–20)
CALCIUM: 9.1 mg/dL (ref 8.9–10.3)
CO2: 26 mmol/L (ref 22–32)
CREATININE: 1.58 mg/dL — AB (ref 0.44–1.00)
Chloride: 97 mmol/L — ABNORMAL LOW (ref 101–111)
GFR calc Af Amer: 37 mL/min — ABNORMAL LOW (ref >60)
GFR, EST NON AFRICAN AMERICAN: 32 mL/min — AB (ref >60)
Glucose, Bld: 149 mg/dL — ABNORMAL HIGH (ref 65–99)
Potassium: 3.1 mmol/L — ABNORMAL LOW (ref 3.5–5.1)
SODIUM: 135 mmol/L (ref 135–145)

## 2016-06-20 LAB — D-DIMER, QUANTITATIVE (NOT AT ARMC): D DIMER QUANT: 4.76 ug{FEU}/mL — AB (ref 0.00–0.50)

## 2016-06-20 LAB — TROPONIN I: Troponin I: <0.03 ng/mL (ref <0.03)

## 2016-06-20 MED ORDER — SODIUM CHLORIDE 0.9 % IV BOLUS (SEPSIS)
500.0000 mL | Freq: Once | INTRAVENOUS | Status: AC
  Administered 2016-06-20: 500 mL via INTRAVENOUS

## 2016-06-20 MED ORDER — IOPAMIDOL (ISOVUE-370) INJECTION 76%
100.0000 mL | Freq: Once | INTRAVENOUS | Status: AC | PRN
  Administered 2016-06-20: 75 mL via INTRAVENOUS

## 2016-06-20 NOTE — Discharge Instructions (Signed)
Follow-up with your primary care doctor to discuss her blood pressure medications. Hold on your blood pressure medications this weekend.  Return as needed for worsening symptoms fever, chest pain or other concerns

## 2016-06-20 NOTE — Therapy (Signed)
[facility] [address]  [place], [place], [address] Phone: [phone]   Fax:  [phone]  Patient Details  Name: Mallory Murphy MRN: [id number] Date of Birth: 01-30-1946 Referring Provider:  Gaynelle Arabian, MD  Encounter Date: 06/20/2016  Patient arriving to PT with niece. Ambulating with rollator. Patient and niece both stating that patient has not been feeling well (reduced fluid and food intake, general fatigue)  this week but felt bad cancelling treatment due to cancellation earlier this week. Reports reduced fluid intake, however, has paid attention to urine output with no noticeable difference in color or odor. PT assessing vitals upon entering PT gym. Seated HR at 118 bpm, seated BP 112/72 mmHg. Reassessed in standing position with HR increasing to 137 bpm after standing 1-2 minutes, with BP dropping to approx ~100/60, however very difficult to hear. PT advising both patient and niece to either follow-up with MD or go to ED downstairs, with both electing to go to ED. PT transporting patient by wheelchair to ED and stayed with patient until checked-in.    Lanney Gins, PT, DPT 06/20/16 11:40 AM   [facility] [address]  [place], [place], [address] Phone: [phone]   Fax:  [phone]

## 2016-06-20 NOTE — ED Provider Notes (Signed)
[facility] Provider Note   CSN: [id number] Arrival date & time: 06/20/16  1125     History   Chief Complaint Chief Complaint  Patient presents with  . Weakness    HPI Mallory Murphy is a 71 y.o. female.  HPI Pt had knee surgery in February (12-14th).   She hasn't quite felt right since then.  She has been feeling weak and lightheaded.  She feels like she is going to pass out when she gets up and walks.   She has had some nausea but no vomiting.  She has not had much of an appetite.  Overall her knee is feeling much better Than when she has not noticed any increasing swelling or redness. She generally feels fine when she first wakes up but as the day progresses the symptoms seemed to return. She did have an adjustment in her blood pressure medications about a week before her surgery. Her blood pressure was running high so she had her medications increased. Past Medical History:  Diagnosis Date  . Anemia    hx blood tranfusions  . Arthritis   . Diverticulosis   . DJD (degenerative joint disease) of cervical spine   . Hypertension     Patient Active Problem List   Diagnosis Date Noted  . OA (osteoarthritis) of knee 05/26/2016  . AKI (acute kidney injury) ([place]) 06/21/2014  . Dehydration 06/21/2014  . Syncope and collapse 06/21/2014  . Hypokalemia 06/21/2014  . Palpitation 06/21/2014  . HTN (hypertension) 05/07/2012  . Arthritis, rheumatoid ([place]) 05/07/2012    Past Surgical History:  Procedure Laterality Date  . ELBOW ARTHROPLASTY Right    X3   . JOINT REPLACEMENT     both hips  . 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 TOTAL SHOULDER ARTHROPLASTY Left     OB History    No data available       Home Medications    Prior to Admission medications   Medication Sig Start Date End Date Taking? Authorizing Provider    escitalopram (LEXAPRO) 10 MG tablet Take 10 mg by mouth daily.    Historical Provider, MD  iron polysaccharides (NIFEREX) 150 MG capsule Take 1 capsule (150 mg total) by mouth 2 (two) times daily. 05/27/16   Alexzandrew L Perkins, PA-C  methocarbamol (ROBAXIN) 500 MG tablet Take 1 tablet (500 mg total) by mouth every 6 (six) hours as needed for muscle spasms. 05/27/16   Alexzandrew L Perkins, PA-C  metoprolol tartrate (LOPRESSOR) 25 MG tablet Take 1 tablet (25 mg total) by mouth 2 (two) times daily. 06/22/14   Shanker Kristeen Mans, MD  oxybutynin (DITROPAN-XL) 5 MG 24 hr tablet Take 5 mg by mouth at bedtime.    Historical Provider, MD  oxyCODONE (OXY IR/ROXICODONE) 5 MG immediate release tablet Take 1-2 tablets (5-10 mg total) by mouth every 4 (four) hours as needed for moderate pain or severe pain. 05/27/16   Alexzandrew L Perkins, PA-C  rivaroxaban (XARELTO) 10 MG TABS tablet Take 1 tablet (10 mg total) by mouth daily with breakfast. Take Xarelto for two and a half more weeks following discharge from the hospital, then discontinue Xarelto. Once the patient has completed the blood thinner regimen, then take a Baby 81 mg Aspirin daily for three more weeks. 05/28/16   Alexzandrew L Perkins, PA-C  traMADol (ULTRAM) 50 MG tablet Take 1-2 tablets (  50-100 mg total) by mouth every 6 (six) hours as needed for moderate pain. 05/27/16   Alexzandrew Monika Salk, PA-C    Family History No family history on file.  Social History Social History  Substance Use Topics  . Smoking status: Never Smoker  . Smokeless tobacco: Never Used  . Alcohol use Yes     Comment: socially     Allergies   Macrobid [nitrofurantoin macrocrystal]   Review of Systems Review of Systems  All other systems reviewed and are negative.    Physical Exam Updated Vital Signs BP 149/65   Pulse 80   Temp 98.9 F (37.2 C) (Oral)   Resp 15   Ht 5\' 2"  (1.575 m)   Wt 58.5 kg   SpO2 100%   BMI 23.59 kg/m   Physical Exam   Constitutional: No distress.  HENT:  Head: Normocephalic and atraumatic.  Right Ear: External ear normal.  Left Ear: External ear normal.  Eyes: Conjunctivae are normal. Right eye exhibits no discharge. Left eye exhibits no discharge. No scleral icterus.  Neck: Neck supple. No tracheal deviation present.  Cardiovascular: Normal rate, regular rhythm and intact distal pulses.   Pulmonary/Chest: Effort normal and breath sounds normal. No stridor. No respiratory distress. She has no wheezes. She has no rales.  Abdominal: Soft. Bowel sounds are normal. She exhibits no distension. There is no tenderness. There is no rebound and no guarding.  Musculoskeletal: She exhibits no edema or tenderness.  No erythema or edema noted, no swelling of her knee  Neurological: She is alert. She has normal strength. No cranial nerve deficit (no facial droop, extraocular movements intact, no slurred speech) or sensory deficit. She exhibits normal muscle tone. She displays no seizure activity. Coordination normal.  Skin: Skin is warm and dry. No rash noted. She is not diaphoretic.  Psychiatric: She has a normal mood and affect.  Nursing note and vitals reviewed.    ED Treatments / Results  Labs (all labs ordered are listed, but only abnormal results are displayed) Labs Reviewed  CBC WITH DIFFERENTIAL/PLATELET - Abnormal; Notable for the following:       Result Value   RBC 3.23 (*)    Hemoglobin 9.1 (*)    HCT 28.2 (*)    Platelets 443 (*)    All other components within normal limits  BASIC METABOLIC PANEL - Abnormal; Notable for the following:    Potassium 3.1 (*)    Chloride 97 (*)    Glucose, Bld 149 (*)    BUN 22 (*)    Creatinine, Ser 1.58 (*)    GFR calc non Af Amer 32 (*)    GFR calc Af Amer 37 (*)    All other components within normal limits  D-DIMER, QUANTITATIVE (NOT AT [facility]) - Abnormal; Notable for the following:    D-Dimer, Quant 4.76 (*)    All other components within normal limits   TROPONIN I    EKG  EKG Interpretation  Date/Time:  Friday June 20 2016 11:48:53 EST Ventricular Rate:  92 PR Interval:    QRS Duration: 88 QT Interval:  358 QTC Calculation: 443 R Axis:   69 Text Interpretation:  Sinus rhythm RSR' in V1 or V2, probably normal variant Nonspecific repol abnormality, diffuse leads No significant change since last tracing Confirmed by Carlester Kasparek  MD-J, Isidra Mings ([id number]) on 06/20/2016 11:52:20 AM       Radiology Dg Chest 2 View  Result Date: 06/20/2016 CLINICAL DATA:  Weakness since knee surgery  February 12, tachycardia, hypotension EXAM: CHEST  2 VIEW COMPARISON:  06/21/2014 FINDINGS: Cardiomediastinal silhouette is stable. Mild hyperinflation again noted. Left shoulder prosthesis again noted. Stable degenerative changes right shoulder. Osteopenia and mild degenerative changes thoracic spine. IMPRESSION: No active cardiopulmonary disease. Mild hyperinflation. No significant change. Electronically Signed   By: Lahoma Crocker M.D.   On: 06/20/2016 12:36   Ct Angio Chest Pe W And/or Wo Contrast  Result Date: 06/20/2016 CLINICAL DATA:  Weakness since knee surgery May 26, 2016. EXAM: CT ANGIOGRAPHY CHEST WITH CONTRAST TECHNIQUE: Multidetector CT imaging of the chest was performed using the standard protocol during bolus administration of intravenous contrast. Multiplanar CT image reconstructions and MIPs were obtained to evaluate the vascular anatomy. CONTRAST:  100 mL Isovue 370 COMPARISON:  None. FINDINGS: Cardiovascular: Satisfactory opacification of the pulmonary arteries to the segmental level. No evidence of pulmonary embolism. Normal heart size. No pericardial effusion. Normal caliber thoracic aorta. Thoracic aortic atherosclerosis. Coronary artery atherosclerosis in the LAD. Mediastinum/Nodes: No enlarged mediastinal, hilar, or axillary lymph nodes. Thyroid gland, trachea, and esophagus demonstrate no significant findings. Lungs/Pleura: Lungs are clear. No pleural  effusion or pneumothorax. Upper Abdomen: No acute upper abdominal abnormality. Moderate size hiatal hernia. Musculoskeletal: No acute osseous abnormality. Total reverse left shoulder arthroplasty. Review of the MIP images confirms the above findings. IMPRESSION: 1. No evidence pulmonary embolus. 2. No acute cardiopulmonary disease. 3. Coronary artery atherosclerosis. 4.  Aortic Atherosclerosis (ICD10-170.0) Electronically Signed   By: Kathreen Devoid   On: 06/20/2016 14:36    Procedures Procedures (including critical care time)  Medications Ordered in ED Medications  sodium chloride 0.9 % bolus 500 mL (0 mLs Intravenous Stopped 06/20/16 1318)  iopamidol (ISOVUE-370) 76 % injection 100 mL (75 mLs Intravenous Contrast Given 06/20/16 1411)  sodium chloride 0.9 % bolus 500 mL (500 mLs Intravenous New Bag/Given 06/20/16 1519)     Initial Impression / Assessment and Plan / ED Course  I have reviewed the triage vital signs and the nursing notes.  Pertinent labs & imaging results that were available during my care of the patient were reviewed by me and considered in my medical decision making (see chart for details).  Clinical Course as of Jun 21 1522  Fri Jun 20, 2016  1414 Checked on cr clearance.  Pt can have a CT with reduced dose of dye.  She was given a fluid bolus first  [JK]    Clinical Course User Index [JK] Dorie Rank, MD    The patient's blood pressure continued to improve while she was here in the emergency room. Her most recent blood pressure is now 149/65.   Patient's cardiac enzymes and CT scan were negative for any acute process.  Her hemoglobin is slowly improving.    Patient's BUN and creatinine however are elevated compared to previous values.  I suspect this may be related to the change in her blood pressure medications. Patient was started on Hyzaar. I think this may be contributing to the renal insufficiency and the hypotension. She has noticed that the symptoms seemed to occur  after she is taking her medications for the day.  Patient is feeling better right now. She is comfortable going home. We discussed holding off on her new blood pressure medication. Follow-up with her primary care doctor. Return to emergency room for fever or shortness of breath, worsening symptoms.   Final Clinical Impressions(s) / ED Diagnoses   Final diagnoses:  Dehydration  Hypotension due to drugs  Renal insufficiency  New Prescriptions Current Discharge Medication List       Dorie Rank, MD 06/20/16 1524

## 2016-06-20 NOTE — ED Triage Notes (Addendum)
Weakness since knee surgery February 12. She is pale, tachycardic, hypotensive, dizzy with standing.

## 2016-06-23 ENCOUNTER — Ambulatory Visit: Payer: Medicare Other

## 2016-06-26 ENCOUNTER — Ambulatory Visit: Payer: Medicare Other | Admitting: Physical Therapy

## 2016-06-30 ENCOUNTER — Ambulatory Visit: Payer: Medicare Other

## 2016-07-01 ENCOUNTER — Ambulatory Visit: Payer: Medicare Other

## 2016-07-01 DIAGNOSIS — M6281 Muscle weakness (generalized): Secondary | ICD-10-CM

## 2016-07-01 DIAGNOSIS — M25561 Pain in right knee: Secondary | ICD-10-CM | POA: Diagnosis not present

## 2016-07-01 DIAGNOSIS — R2689 Other abnormalities of gait and mobility: Secondary | ICD-10-CM

## 2016-07-01 DIAGNOSIS — R262 Difficulty in walking, not elsewhere classified: Secondary | ICD-10-CM

## 2016-07-01 DIAGNOSIS — M25661 Stiffness of right knee, not elsewhere classified: Secondary | ICD-10-CM

## 2016-07-01 NOTE — Therapy (Signed)
[facility] [address]  [place], [place], [address] Phone: [phone]   Fax:  [phone]  Physical Therapy Treatment  Patient Details  Name: Mallory Murphy MRN: [id number] Date of Birth: 1946-01-14 Referring Provider: Dr. Gaynelle Arabian  Encounter Date: 07/01/2016      PT End of Session - 07/01/16 1543    Visit Number 4   Number of Visits 10   Date for PT Re-Evaluation 07/07/16   Authorization Type Medicare   PT Start Time 1761  pt. arriving late    PT Stop Time 1628   PT Time Calculation (min) 53 min   Activity Tolerance Patient tolerated treatment well      Past Medical History:  Diagnosis Date  . Anemia    hx blood tranfusions  . Arthritis   . Diverticulosis   . DJD (degenerative joint disease) of cervical spine   . Hypertension     Past Surgical History:  Procedure Laterality Date  . ELBOW ARTHROPLASTY Right    X3   . JOINT REPLACEMENT     both hips  . 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 TOTAL SHOULDER ARTHROPLASTY Left     There were no vitals filed for this visit.      Subjective Assessment - 07/01/16 1540    Subjective Pt. reporting MD f/u last Tuesday went well with MD telling pt. she could return to therapy only if she felt like she needed to.  Pt. noting she feels she needs to continue therapy.     Patient Stated Goals regain mobility (walking)   Currently in Pain? Yes   Pain Score 1    Pain Location Knee   Pain Orientation Right   Pain Descriptors / Indicators Aching   Pain Type Surgical pain   Pain Onset More than a month ago   Pain Frequency Constant   Aggravating Factors  moving, walking   Pain Relieving Factors meds   Multiple Pain Sites No            [facility] PT Assessment - 07/01/16 1551      PROM   PROM Assessment Site  Knee   Right/Left Knee Right   Right Knee Extension 4   Right Knee Flexion 120                     [facility] Adult PT Treatment/Exercise - 07/01/16 1600      Ambulation/Gait   Ambulation/Gait Yes   Ambulation/Gait Assistance 5: Supervision   Ambulation Distance (Feet) 180 Feet   Gait Pattern Step-through pattern;Decreased step length - left;Decreased stance time - right;Decreased weight shift to right;Antalgic   Ambulation Surface Level;Indoor   Gait Comments Antalgic gait with no AD; some forward flexed trunk with uneven wt. shift, and visible instability with gait     Knee/Hip Exercises: Stretches   Other Knee/Hip Stretches R knee prone hang x 1 min    Other Knee/Hip Stretches Seated R knee extension stretch x 1 min      Knee/Hip Exercises: Aerobic   Nustep NuStep: lvl 5, 7 min      Knee/Hip Exercises: Supine   Bridges 15 reps;AROM;Strengthening   Bridges Limitations 3" hold     Bridges with Clamshell AROM;1 set;Both;15 reps  3" hold with yellow TB around knees    Straight Leg  Raises AROM;Right;15 reps  first 2 reps without weight; good control    Straight Leg Raises Limitations 2#    Knee Flexion AROM;10 reps;Both   Knee Flexion Limitations R knee flexion stretch with heels on peanut p-ball 5" x 10 reps   Other Supine Knee/Hip Exercises SL bridge with heels on peanut P-ball x 15 reps     Knee/Hip Exercises: Sidelying   Clams L sidelying R hip clam shell with yellow TB x 15 reps; very difficult for pt.      Vasopneumatic   Number Minutes Vasopneumatic  8 minutes  terminated after 8 min upon pt. request    Vasopnuematic Location  Knee   Vasopneumatic Pressure Medium   Vasopneumatic Temperature  coldest temp     Manual Therapy   Manual Therapy Passive ROM;Joint mobilization   Joint Mobilization Patellar mobs infrerior/superior, and R knee anterior/posterior mobs for increased ROM                  PT Short Term Goals - 07/01/16 1555      PT SHORT  TERM GOAL #1   Title patient to be independent wiht HEP (06/23/16)   Status On-going  3.20.18: pt. admitting to not performing HEP      PT SHORT TERM GOAL #2   Title patient to improve R knee PROM to 0-120 (06/23/16)   Status Partially Met  3.20.18: R knee PROM 4-120           PT Long Term Goals - 07/01/16 1556      PT LONG TERM GOAL #1   Title patient to be independent with advanced HEP (07/07/16)   Status On-going     PT LONG TERM GOAL #2   Title patient to improve R knee AROM to 0-120 (07/07/16)   Status On-going     PT LONG TERM GOAL #3   Title patient to demonstrate appropriate gait mechanics with good heel toe gait pattern with no AD (07/07/16)   Status Partially Met  3.20.18: pt. able to ambulate with good overall mechanics without AD however still with antalgic gait and forward flexed posture     PT LONG TERM GOAL #4   Title Patient to demonstrate no extensor lag with SLR (07/07/16)   Status Partially Met  3.20.18: only mild extensor lag present                Plan - 07/01/16 1641    Clinical Impression Statement Pt. noting MD f/u went well with MD pleased with R knee motion and releasing pt. from therapy.  MD leaving continued therapy up to pt. however pt. reporting she feels she needs to continue.  R knee PROM 4-120 dg today with testing.  Pt. ambulating without AD today however with antalgic gait pattern, visible instability, and forward flexed trunk.  Some extensor lag still present with SLR.  Pt. requesting ice to end treatment however asking to terminate after 8 min.  Pt. will continue to benefit from further skilled therapy to maximize LE strength, ROM, and function.   PT Treatment/Interventions ADLs/Self Care Home Management;Cryotherapy;Electrical Stimulation;Moist Heat;Ultrasound;Neuromuscular re-education;Balance training;Therapeutic exercise;Therapeutic activities;Stair training;Gait training;Functional mobility training;Patient/family education;Manual  techniques;Passive range of motion;Vasopneumatic Device;Taping;Dry needling   PT Next Visit Plan progress R knee ROM and strength, gait training      Patient will benefit from skilled therapeutic intervention in order to improve the following deficits and impairments:  Abnormal gait, Decreased activity tolerance, Decreased balance, Decreased range of motion, Decreased mobility, Decreased  strength, Difficulty walking, Pain, Increased edema  Visit Diagnosis: Acute pain of right knee  Stiffness of right knee, not elsewhere classified  Difficulty in walking, not elsewhere classified  Other abnormalities of gait and mobility  Muscle weakness (generalized)     Problem List Patient Active Problem List   Diagnosis Date Noted  . OA (osteoarthritis) of knee 05/26/2016  . AKI (acute kidney injury) ([place]) 06/21/2014  . Dehydration 06/21/2014  . Syncope and collapse 06/21/2014  . Hypokalemia 06/21/2014  . Palpitation 06/21/2014  . HTN (hypertension) 05/07/2012  . Arthritis, rheumatoid ([place]) 05/07/2012    Bess Harvest, PTA 07/01/16 4:57 PM  [facility] [address]  [place], [place], [address] Phone: [phone]   Fax:  [phone]  Name: KALIKA SMAY MRN: [id number] Date of Birth: [birth year]

## 2016-07-03 ENCOUNTER — Ambulatory Visit: Payer: Medicare Other

## 2016-07-03 ENCOUNTER — Ambulatory Visit: Payer: Medicare Other | Admitting: Physical Therapy

## 2016-07-03 DIAGNOSIS — M25561 Pain in right knee: Secondary | ICD-10-CM | POA: Diagnosis not present

## 2016-07-03 DIAGNOSIS — M25661 Stiffness of right knee, not elsewhere classified: Secondary | ICD-10-CM

## 2016-07-03 DIAGNOSIS — M6281 Muscle weakness (generalized): Secondary | ICD-10-CM

## 2016-07-03 DIAGNOSIS — R2689 Other abnormalities of gait and mobility: Secondary | ICD-10-CM

## 2016-07-03 DIAGNOSIS — R262 Difficulty in walking, not elsewhere classified: Secondary | ICD-10-CM

## 2016-07-03 NOTE — Therapy (Signed)
[facility] [address]  [place], [place], [address] Phone: [phone]   Fax:  [phone]  Physical Therapy Treatment  Patient Details  Name: Mallory Murphy MRN: [id number] Date of Birth: May 19, 1945 Referring Provider: Dr. Gaynelle Arabian  Encounter Date: 07/03/2016      PT End of Session - 07/03/16 1538    Visit Number 5   Number of Visits 10   Date for PT Re-Evaluation 07/07/16   Authorization Type Medicare   PT Start Time 1534   PT Stop Time 1613   PT Time Calculation (min) 39 min   Activity Tolerance Patient tolerated treatment well      Past Medical History:  Diagnosis Date  . Anemia    hx blood tranfusions  . Arthritis   . Diverticulosis   . DJD (degenerative joint disease) of cervical spine   . Hypertension     Past Surgical History:  Procedure Laterality Date  . ELBOW ARTHROPLASTY Right    X3   . JOINT REPLACEMENT     both hips  . 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 TOTAL SHOULDER ARTHROPLASTY Left     There were no vitals filed for this visit.      Subjective Assessment - 07/03/16 1539    Subjective pt. reporting she was sore following last visit in R knee and hip muscles.    Patient Stated Goals regain mobility (walking)   Currently in Pain? Yes   Pain Score 3    Pain Location Knee   Pain Orientation Right   Pain Descriptors / Indicators Aching   Pain Type Surgical pain   Multiple Pain Sites No                         [facility] Adult PT Treatment/Exercise - 07/03/16 1540      Self-Care   Self-Care Other Self-Care Comments   Other Self-Care Comments  instructed pt. on self-scar tissue massage     Knee/Hip Exercises: Aerobic   Recumbent Bike bike: level 1, 7 min      Knee/Hip Exercises: Standing   Hip Flexion  Right;Left;1 set;10 reps;Knee straight   Hip Flexion Limitations holding onto TM 1 UE    Hip Abduction Right;Left;Knee straight;10 reps;1 set   Abduction Limitations 2 holding onto TM   Hip Extension Right;Left;1 set;10 reps;Knee straight   Extension Limitations 2 holding onto TM    Other Standing Knee Exercises 8" R toe tap with 1# x 20 reps      Knee/Hip Exercises: Seated   Long Arc Quad AROM;Right;10 reps  3" hold    Long Arc Quad Weight 2 lbs.   Long [employer] Limitations with adduction ball squeeze    Sit to [employer] 10 reps;1 set  no UE pushoff      Knee/Hip Exercises: Supine   Bridges 15 reps;AROM;Strengthening   Bridges Limitations 3" hold     Straight Leg Raises AROM;Right;15 reps;2 sets  less resistance due to soreness    Other Supine Knee/Hip Exercises B HS curl with heels on peanut p-ball x 15 reps     Knee/Hip Exercises: Sidelying   Hip ABduction AROM;1 set;10 reps;Right     Knee/Hip Exercises: Prone   Hip Extension AROM;Right;1 set;10 reps  PT Short Term Goals - 07/01/16 1555      PT SHORT TERM GOAL #1   Title patient to be independent wiht HEP (06/23/16)   Status On-going  3.20.18: pt. admitting to not performing HEP      PT SHORT TERM GOAL #2   Title patient to improve R knee PROM to 0-120 (06/23/16)   Status Partially Met  3.20.18: R knee PROM 4-120           PT Long Term Goals - 07/01/16 1556      PT LONG TERM GOAL #1   Title patient to be independent with advanced HEP (07/07/16)   Status On-going     PT LONG TERM GOAL #2   Title patient to improve R knee AROM to 0-120 (07/07/16)   Status On-going     PT LONG TERM GOAL #3   Title patient to demonstrate appropriate gait mechanics with good heel toe gait pattern with no AD (07/07/16)   Status Partially Met  3.20.18: pt. able to ambulate with good overall mechanics without AD however still with antalgic gait and forward flexed posture     PT LONG TERM GOAL #4   Title  Patient to demonstrate no extensor lag with SLR (07/07/16)   Status Partially Met  3.20.18: only mild extensor lag present                Plan - 07/03/16 1804    Clinical Impression Statement Pt. reporting initially today she was very sore in R knee and hip muscles following last visit however this has improved today somewhat.  Therex less intense today to accommodate for muscular soreness.  Treatment focusing on hip/knee strengthening activity to pt. tolerance.  Pt. with report of R knee, "loosening up" following treatment today ending with low-level pain.  Pt. verbalizing that she does want to continue therapy, "to get stronger".  Pt. will continue to benefit from further skilled therapy to maximize LE functional strength and activity tolerance.     PT Treatment/Interventions ADLs/Self Care Home Management;Cryotherapy;Electrical Stimulation;Moist Heat;Ultrasound;Neuromuscular re-education;Balance training;Therapeutic exercise;Therapeutic activities;Stair training;Gait training;Functional mobility training;Patient/family education;Manual techniques;Passive range of motion;Vasopneumatic Device;Taping;Dry needling   PT Next Visit Plan Recert; progress R knee ROM and strength, gait training      Patient will benefit from skilled therapeutic intervention in order to improve the following deficits and impairments:  Abnormal gait, Decreased activity tolerance, Decreased balance, Decreased range of motion, Decreased mobility, Decreased strength, Difficulty walking, Pain, Increased edema  Visit Diagnosis: Acute pain of right knee  Stiffness of right knee, not elsewhere classified  Difficulty in walking, not elsewhere classified  Other abnormalities of gait and mobility  Muscle weakness (generalized)     Problem List Patient Active Problem List   Diagnosis Date Noted  . OA (osteoarthritis) of knee 05/26/2016  . AKI (acute kidney injury) ([place]) 06/21/2014  . Dehydration 06/21/2014  .  Syncope and collapse 06/21/2014  . Hypokalemia 06/21/2014  . Palpitation 06/21/2014  . HTN (hypertension) 05/07/2012  . Arthritis, rheumatoid ([place]) 05/07/2012    Bess Harvest, PTA 07/03/16 6:15 PM  [facility] [address]  [place], [place], [address] Phone: [phone]   Fax:  [phone]  Name: Mallory Murphy MRN: [id number] Date of Birth: 1945/06/07

## 2016-07-08 ENCOUNTER — Ambulatory Visit: Payer: Medicare Other | Admitting: Physical Therapy

## 2016-07-08 DIAGNOSIS — R2689 Other abnormalities of gait and mobility: Secondary | ICD-10-CM

## 2016-07-08 DIAGNOSIS — M25561 Pain in right knee: Secondary | ICD-10-CM | POA: Diagnosis not present

## 2016-07-08 DIAGNOSIS — M25661 Stiffness of right knee, not elsewhere classified: Secondary | ICD-10-CM

## 2016-07-08 DIAGNOSIS — R262 Difficulty in walking, not elsewhere classified: Secondary | ICD-10-CM

## 2016-07-08 DIAGNOSIS — M6281 Muscle weakness (generalized): Secondary | ICD-10-CM

## 2016-07-08 NOTE — Therapy (Signed)
[facility] [address]  [place], [place], [address] Phone: [phone]   Fax:  [phone]  Physical Therapy Treatment  Patient Details  Name: Mallory Murphy MRN: [id number] Date of Birth: Aug 05, 1945 Referring Provider: Dr. Gaynelle Arabian  Encounter Date: 07/08/2016      PT End of Session - 07/08/16 1320    Visit Number 6   Number of Visits 14   Date for PT Re-Evaluation 08/08/16   Authorization Type Medicare   PT Start Time 5537   PT Stop Time 1400   PT Time Calculation (min) 43 min   Activity Tolerance Patient tolerated treatment well   Behavior During Therapy [facility] for tasks assessed/performed      Past Medical History:  Diagnosis Date  . Anemia    hx blood tranfusions  . Arthritis   . Diverticulosis   . DJD (degenerative joint disease) of cervical spine   . Hypertension     Past Surgical History:  Procedure Laterality Date  . ELBOW ARTHROPLASTY Right    X3   . JOINT REPLACEMENT     both hips  . 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 TOTAL SHOULDER ARTHROPLASTY Left     There were no vitals filed for this visit.      Subjective Assessment - 07/08/16 1319    Subjective feeling well - has been trying to walk a bit more - longest walk around grocery store. Wants to continue PT to build strength.   Patient Stated Goals regain mobility (walking)   Currently in Pain? No/denies   Pain Score 0-No pain            [facility] PT Assessment - 07/08/16 1323      Assessment   Medical Diagnosis R TKA   Referring Provider Dr. Gaynelle Arabian   Next MD Visit 08/05/16     AROM   Right Knee Extension 3   Right Knee Flexion 120                     [facility] Adult PT Treatment/Exercise - 07/08/16 1325      Knee/Hip Exercises: Aerobic   Recumbent Bike level  2 x 6 minutes     Knee/Hip Exercises: Standing   Hip Flexion Both;15 reps;Knee bent   Hip Flexion Limitations single hand hold - 2# R LE   Hip Abduction Right;15 reps;Knee straight   Abduction Limitations single hand hold - 2# R LE   Hip Extension Right;15 reps;Knee straight   Extension Limitations singel hand hold - 2# R LE   Forward Step Up Right;15 reps;Hand Hold: 1;Step Height: 4"     Knee/Hip Exercises: Seated   Long Arc Quad Strengthening;Right;15 reps;Weights   Long Arc Quad Weight 3 lbs.   Hamstring Curl Strengthening;Right;15 reps   Hamstring Limitations green tband   Sit to Sand 15 reps;with UE support  onto AirEx     Knee/Hip Exercises: Supine   Bridges Both;15 reps   Bridges Limitations 3-5 sec hold   Straight Leg Raises Strengthening;Right;15 reps   Straight Leg Raises Limitations 2#                  PT Short Term Goals - 07/08/16 1422      PT SHORT TERM GOAL #1   Title patient to be independent wiht  HEP (06/23/16)   Status Achieved     PT SHORT TERM GOAL #2   Title patient to improve R knee PROM to 0-120 (06/23/16)   Status Partially Met  3-120           PT Long Term Goals - 07/08/16 1422      PT LONG TERM GOAL #1   Title patient to be independent with advanced HEP (08/08/16)   Status On-going     PT LONG TERM GOAL #2   Title patient to improve R knee AROM to 0-120 (08/08/16)   Status On-going     PT LONG TERM GOAL #3   Title patient to demonstrate appropriate gait mechanics with good heel toe gait pattern with no AD (08/08/16)   Status On-going     PT LONG TERM GOAL #4   Title Patient to demonstrate no extensor lag with SLR (08/08/16)   Status On-going               Plan - 07/08/16 1322    Clinical Impression Statement [name] presenting to [place] today - feeling well, reports MD is pleased with her ROM progress. Patient reporting she has spoken with MD regarding continuing PT to build strength as well as work towards full knee  extension. PT agreeing with this due to patient only utilizing 5 visits from initial established plan of care and will benefit from continued stengthening as well as balance training to maximize functional mobility.    PT Treatment/Interventions ADLs/Self Care Home Management;Cryotherapy;Electrical Stimulation;Moist Heat;Ultrasound;Neuromuscular re-education;Balance training;Therapeutic exercise;Therapeutic activities;Stair training;Gait training;Functional mobility training;Patient/family education;Manual techniques;Passive range of motion;Vasopneumatic Device;Taping;Dry needling   PT Next Visit Plan progress R knee ROM and strength, gait training   Consulted and Agree with Plan of Care Patient      Patient will benefit from skilled therapeutic intervention in order to improve the following deficits and impairments:  Abnormal gait, Decreased activity tolerance, Decreased balance, Decreased range of motion, Decreased mobility, Decreased strength, Difficulty walking, Pain, Increased edema  Visit Diagnosis: Acute pain of right knee - Plan: PT plan of care cert/re-cert  Stiffness of right knee, not elsewhere classified - Plan: PT plan of care cert/re-cert  Difficulty in walking, not elsewhere classified - Plan: PT plan of care cert/re-cert  Other abnormalities of gait and mobility - Plan: PT plan of care cert/re-cert  Muscle weakness (generalized) - Plan: PT plan of care cert/re-cert     Problem List Patient Active Problem List   Diagnosis Date Noted  . OA (osteoarthritis) of knee 05/26/2016  . AKI (acute kidney injury) ([place]) 06/21/2014  . Dehydration 06/21/2014  . Syncope and collapse 06/21/2014  . Hypokalemia 06/21/2014  . Palpitation 06/21/2014  . HTN (hypertension) 05/07/2012  . Arthritis, rheumatoid ([place]) 05/07/2012    Lanney Gins, PT, DPT 07/08/16 3:54 PM   [facility] [address]  [place], [place],  [address] Phone: [phone]   Fax:  [phone]  Name: Mallory Murphy MRN: [id number] Date of Birth: November 02, 1945

## 2016-07-10 ENCOUNTER — Ambulatory Visit: Payer: Medicare Other

## 2016-07-10 DIAGNOSIS — M25561 Pain in right knee: Secondary | ICD-10-CM

## 2016-07-10 DIAGNOSIS — M25661 Stiffness of right knee, not elsewhere classified: Secondary | ICD-10-CM

## 2016-07-10 DIAGNOSIS — M6281 Muscle weakness (generalized): Secondary | ICD-10-CM

## 2016-07-10 DIAGNOSIS — R2689 Other abnormalities of gait and mobility: Secondary | ICD-10-CM

## 2016-07-10 DIAGNOSIS — R262 Difficulty in walking, not elsewhere classified: Secondary | ICD-10-CM

## 2016-07-10 NOTE — Therapy (Signed)
[facility] [address]  [place], [place], [address] Phone: [phone]   Fax:  [phone]  Physical Therapy Treatment  Patient Details  Name: Mallory Murphy MRN: [id number] Date of Birth: [birth year] Referring Provider: Dr. Gaynelle Arabian  Encounter Date: 07/10/2016      PT End of Session - 07/10/16 1321    Visit Number 7   Number of Visits 14   Date for PT Re-Evaluation 08/08/16   Authorization Type Medicare   PT Start Time 1316   PT Stop Time 1400   PT Time Calculation (min) 44 min   Activity Tolerance Patient tolerated treatment well   Behavior During Therapy [facility] for tasks assessed/performed      Past Medical History:  Diagnosis Date  . Anemia    hx blood tranfusions  . Arthritis   . Diverticulosis   . DJD (degenerative joint disease) of cervical spine   . Hypertension     Past Surgical History:  Procedure Laterality Date  . ELBOW ARTHROPLASTY Right    X3   . JOINT REPLACEMENT     both hips  . 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 TOTAL SHOULDER ARTHROPLASTY Left     There were no vitals filed for this visit.      Subjective Assessment - 07/10/16 1319    Subjective Pt. reporting some R knee aching at night.    Patient Stated Goals regain mobility (walking)   Currently in Pain? No/denies   Pain Score 0-No pain   Multiple Pain Sites No                         [facility] Adult PT Treatment/Exercise - 07/10/16 1323      Ambulation/Gait   Ambulation/Gait Yes   Ambulation/Gait Assistance 5: Supervision   Ambulation Distance (Feet) 400 Feet   Gait Pattern Step-through pattern;Decreased step length - left;Decreased stance time - right;Decreased weight shift to right;Antalgic   Gait Comments Still with antalgic gait and decreased step length  however improving; cues for even stance time and upright posture throughout      Knee/Hip Exercises: Aerobic   Recumbent Bike level 2 x 6 minutes     Knee/Hip Exercises: Standing   Hip Flexion Both;Knee bent;20 reps   Hip Flexion Limitations 2#; at TM    Hip Abduction Right;Knee straight;20 reps   Abduction Limitations 2#; at TM   Hip Extension Right;Knee straight;20 reps   Extension Limitations 2#; at TM    Other Standing Knee Exercises 8" R toe tap with 2# x 20 reps      Knee/Hip Exercises: Seated   Long Arc Quad Strengthening;Right;Weights;20 reps   Long Arc Quad Weight 3 lbs.   Long [employer] Limitations with adduction ball squeeze    Other Seated Knee/Hip Exercises Seated fitter leg press (1 black) x 15 reps   Hamstring Curl Strengthening;Right;20 reps   Hamstring Limitations green tband   Sit to Sand 20 reps;with UE support  light pushoff from knees      Knee/Hip Exercises: Supine   Bridges Both;15 reps  with green looped TB around knees   Bridges Limitations 3-5 sec hold   Straight Leg Raises Strengthening;Right;20 reps   Straight Leg Raises Limitations 2#  PT Short Term Goals - 07/08/16 1422      PT SHORT TERM GOAL #1   Title patient to be independent wiht HEP (06/23/16)   Status Achieved     PT SHORT TERM GOAL #2   Title patient to improve R knee PROM to 0-120 (06/23/16)   Status Partially Met  3-120           PT Long Term Goals - 07/08/16 1422      PT LONG TERM GOAL #1   Title patient to be independent with advanced HEP (08/08/16)   Status On-going     PT LONG TERM GOAL #2   Title patient to improve R knee AROM to 0-120 (08/08/16)   Status On-going     PT LONG TERM GOAL #3   Title patient to demonstrate appropriate gait mechanics with good heel toe gait pattern with no AD (08/08/16)   Status On-going     PT LONG TERM GOAL #4   Title Patient to demonstrate no extensor lag with SLR (08/08/16)   Status On-going                Plan - 07/10/16 1321    Clinical Impression Statement Mallory Murphy tolerating progression with strengthening activity well today and demonstrating improved gait mechanics.  Pt. still with shortened step length, and mild antalgic gait however improving on this with cueing.  Pt. will continue to benefit from further skilled therapy to maximize LE strength, and improve gait mechanics and safety.   PT Treatment/Interventions ADLs/Self Care Home Management;Cryotherapy;Electrical Stimulation;Moist Heat;Ultrasound;Neuromuscular re-education;Balance training;Therapeutic exercise;Therapeutic activities;Stair training;Gait training;Functional mobility training;Patient/family education;Manual techniques;Passive range of motion;Vasopneumatic Device;Taping;Dry needling   PT Next Visit Plan progress R knee ROM and strength, gait training      Patient will benefit from skilled therapeutic intervention in order to improve the following deficits and impairments:  Abnormal gait, Decreased activity tolerance, Decreased balance, Decreased range of motion, Decreased mobility, Decreased strength, Difficulty walking, Pain, Increased edema  Visit Diagnosis: Acute pain of right knee  Stiffness of right knee, not elsewhere classified  Difficulty in walking, not elsewhere classified  Other abnormalities of gait and mobility  Muscle weakness (generalized)     Problem List Patient Active Problem List   Diagnosis Date Noted  . OA (osteoarthritis) of knee 05/26/2016  . AKI (acute kidney injury) ([place]) 06/21/2014  . Dehydration 06/21/2014  . Syncope and collapse 06/21/2014  . Hypokalemia 06/21/2014  . Palpitation 06/21/2014  . HTN (hypertension) 05/07/2012  . Arthritis, rheumatoid ([place]) 05/07/2012    Bess Harvest, PTA 07/10/16 7:00 PM  [facility] [address]  [place], [place], [address] Phone: [phone]   Fax:   [phone]  Name: Mallory Murphy MRN: [id number] Date of Birth: Mar 08, 1946

## 2016-07-15 ENCOUNTER — Ambulatory Visit: Payer: Medicare Other | Attending: Orthopedic Surgery | Admitting: Physical Therapy

## 2016-07-15 DIAGNOSIS — M25661 Stiffness of right knee, not elsewhere classified: Secondary | ICD-10-CM | POA: Diagnosis present

## 2016-07-15 DIAGNOSIS — M25561 Pain in right knee: Secondary | ICD-10-CM

## 2016-07-15 DIAGNOSIS — M6281 Muscle weakness (generalized): Secondary | ICD-10-CM

## 2016-07-15 DIAGNOSIS — R2689 Other abnormalities of gait and mobility: Secondary | ICD-10-CM | POA: Diagnosis present

## 2016-07-15 DIAGNOSIS — R262 Difficulty in walking, not elsewhere classified: Secondary | ICD-10-CM | POA: Diagnosis present

## 2016-07-15 NOTE — Therapy (Signed)
[facility] [address]  [place], [place], [address] Phone: [phone]   Fax:  [phone]  Physical Therapy Treatment  Patient Details  Name: Mallory Murphy MRN: [id number] Date of Birth: 02/13/1946 Referring Provider: Dr. Gaynelle Arabian  Encounter Date: 07/15/2016      PT End of Session - 07/15/16 1322    Visit Number 8   Number of Visits 14   Date for PT Re-Evaluation 08/08/16   Authorization Type Medicare   PT Start Time 1319   PT Stop Time 1400   PT Time Calculation (min) 41 min   Activity Tolerance Patient tolerated treatment well   Behavior During Therapy [facility] for tasks assessed/performed      Past Medical History:  Diagnosis Date  . Anemia    hx blood tranfusions  . Arthritis   . Diverticulosis   . DJD (degenerative joint disease) of cervical spine   . Hypertension     Past Surgical History:  Procedure Laterality Date  . ELBOW ARTHROPLASTY Right    X3   . JOINT REPLACEMENT     both hips  . 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 TOTAL SHOULDER ARTHROPLASTY Left     There were no vitals filed for this visit.      Subjective Assessment - 07/15/16 1321    Subjective Patient feeling well today - slight R lateral knee pain   Patient Stated Goals regain mobility (walking)   Currently in Pain? Yes   Pain Score 1    Pain Location Knee   Pain Orientation Right;Lateral   Pain Descriptors / Indicators Aching   Pain Type Surgical pain                         [facility] Adult PT Treatment/Exercise - 07/15/16 1323      Knee/Hip Exercises: Stretches   Gastroc Stretch Right;3 reps;30 seconds   Gastroc Stretch Limitations blue rocker     Knee/Hip Exercises: Aerobic   Recumbent Bike L2 x 6 minutes     Knee/Hip Exercises: Machines for  Strengthening   Cybex Knee Extension 10# B LE x 15; 15# B LE x 15    Cybex Knee Flexion 15# B LE x 15      Knee/Hip Exercises: Seated   Long Arc Quad Strengthening;Right;15 reps   Long Arc Quad Weight 3 lbs.   Other Seated Knee/Hip Exercises fitter - 1 black/1 blue x 15 reps     Knee/Hip Exercises: Supine   Bridges Both;15 reps   Bridges Limitations 3-5 sec hold   Straight Leg Raises Right;15 reps   Straight Leg Raises Limitations 3#   Other Supine Knee/Hip Exercises straight leg bridge on peanut ball x 15 reps     Knee/Hip Exercises: Sidelying   Hip ABduction Strengthening;Right;15 reps   Hip ADduction Strengthening;Right;15 reps     Knee/Hip Exercises: Prone   Hamstring Curl 15 reps   Hamstring Curl Limitations 3#   Hip Extension Strengthening;Right;15 reps                  PT Short Term Goals - 07/08/16 1422      PT SHORT TERM GOAL #1   Title patient to be independent wiht HEP (06/23/16)   Status Achieved     PT SHORT TERM GOAL #  2   Title patient to improve R knee PROM to 0-120 (06/23/16)   Status Partially Met  3-120           PT Long Term Goals - 07/08/16 1422      PT LONG TERM GOAL #1   Title patient to be independent with advanced HEP (08/08/16)   Status On-going     PT LONG TERM GOAL #2   Title patient to improve R knee AROM to 0-120 (08/08/16)   Status On-going     PT LONG TERM GOAL #3   Title patient to demonstrate appropriate gait mechanics with good heel toe gait pattern with no AD (08/08/16)   Status On-going     PT LONG TERM GOAL #4   Title Patient to demonstrate no extensor lag with SLR (08/08/16)   Status On-going               Plan - 07/15/16 1322    Clinical Impression Statement Patient doing well today - some reports of general nausea during session requiring frequent rest breaks. Patient doing well with strengthening and ROM - however does report little complaince with HEP and stretching program. Patient to continue to  progress functional mobility and full knee extension with good end range quad strength needed for good gait mechanics.    PT Treatment/Interventions ADLs/Self Care Home Management;Cryotherapy;Electrical Stimulation;Moist Heat;Ultrasound;Neuromuscular re-education;Balance training;Therapeutic exercise;Therapeutic activities;Stair training;Gait training;Functional mobility training;Patient/family education;Manual techniques;Passive range of motion;Vasopneumatic Device;Taping;Dry needling   PT Next Visit Plan progress R knee ROM and strength   Consulted and Agree with Plan of Care Patient      Patient will benefit from skilled therapeutic intervention in order to improve the following deficits and impairments:  Abnormal gait, Decreased activity tolerance, Decreased balance, Decreased range of motion, Decreased mobility, Decreased strength, Difficulty walking, Pain, Increased edema  Visit Diagnosis: Acute pain of right knee  Stiffness of right knee, not elsewhere classified  Difficulty in walking, not elsewhere classified  Other abnormalities of gait and mobility  Muscle weakness (generalized)     Problem List Patient Active Problem List   Diagnosis Date Noted  . OA (osteoarthritis) of knee 05/26/2016  . AKI (acute kidney injury) ([place]) 06/21/2014  . Dehydration 06/21/2014  . Syncope and collapse 06/21/2014  . Hypokalemia 06/21/2014  . Palpitation 06/21/2014  . HTN (hypertension) 05/07/2012  . Arthritis, rheumatoid ([place]) 05/07/2012     Lanney Gins, PT, DPT 07/15/16 4:49 PM   [facility] [address]  [place], [place], [address] Phone: [phone]   Fax:  [phone]  Name: Mallory Murphy MRN: [id number] Date of Birth: 04-21-1945

## 2016-07-17 ENCOUNTER — Ambulatory Visit: Payer: Medicare Other

## 2016-07-22 ENCOUNTER — Ambulatory Visit: Payer: Medicare Other | Admitting: Physical Therapy

## 2016-07-22 DIAGNOSIS — M6281 Muscle weakness (generalized): Secondary | ICD-10-CM

## 2016-07-22 DIAGNOSIS — M25561 Pain in right knee: Secondary | ICD-10-CM

## 2016-07-22 DIAGNOSIS — M25661 Stiffness of right knee, not elsewhere classified: Secondary | ICD-10-CM

## 2016-07-22 DIAGNOSIS — R262 Difficulty in walking, not elsewhere classified: Secondary | ICD-10-CM

## 2016-07-22 DIAGNOSIS — R2689 Other abnormalities of gait and mobility: Secondary | ICD-10-CM

## 2016-07-22 NOTE — Therapy (Signed)
[facility] [address]  [place], [place], [address] Phone: [phone]   Fax:  [phone]  Physical Therapy Treatment  Patient Details  Name: Mallory Murphy MRN: [id number] Date of Birth: 07-Jul-1945 Referring Provider: Dr. Gaynelle Arabian  Encounter Date: 07/22/2016      PT End of Session - 07/22/16 1324    Visit Number 9   Number of Visits 14   Date for PT Re-Evaluation 08/08/16   Authorization Type Medicare   PT Start Time 1319   PT Stop Time 1400   PT Time Calculation (min) 41 min   Activity Tolerance Patient tolerated treatment well   Behavior During Therapy [facility] for tasks assessed/performed      Past Medical History:  Diagnosis Date  . Anemia    hx blood tranfusions  . Arthritis   . Diverticulosis   . DJD (degenerative joint disease) of cervical spine   . Hypertension     Past Surgical History:  Procedure Laterality Date  . ELBOW ARTHROPLASTY Right    X3   . JOINT REPLACEMENT     both hips  . 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 TOTAL SHOULDER ARTHROPLASTY Left     There were no vitals filed for this visit.      Subjective Assessment - 07/22/16 1322    Subjective Feels like the more she exercises her R leg - her hip becomes loose   Patient Stated Goals regain mobility (walking)   Currently in Pain? No/denies   Pain Score 0-No pain                         [facility] Adult PT Treatment/Exercise - 07/22/16 1324      Knee/Hip Exercises: Aerobic   Recumbent Bike L2 x 6 minutes     Knee/Hip Exercises: Machines for Strengthening   Cybex Knee Extension 15# B LE x 15; 20# B LE x 15   Cybex Knee Flexion 20# B LE x 15     Knee/Hip Exercises: Standing   Hip Abduction Right;15 reps;Knee straight   Abduction Limitations green tband   Hip  Extension Right;15 reps;Knee straight   Extension Limitations green tband   Other Standing Knee Exercises toe clears to 9" stool - 4# at ankle x 15 reps forward; 15 reps lateral     Knee/Hip Exercises: Seated   Long Arc Quad Strengthening;Right;15 reps   Long Arc Quad Weight 4 lbs.   Long [employer] Limitations with adduction ball squeeze    Other Seated Knee/Hip Exercises R LE - fitter - 1 blue/1 black x 15 reps   Sit to Sand 15 reps;without UE support  standing on AirEx     Knee/Hip Exercises: Supine   Bridges Both;15 reps   Bridges Limitations 3-5 sec hold; green tband at knees   Straight Leg Raises Right;15 reps   Straight Leg Raises Limitations 4#                  PT Short Term Goals - 07/08/16 1422      PT SHORT TERM GOAL #1   Title patient to be independent wiht HEP (06/23/16)   Status Achieved     PT SHORT TERM GOAL #2   Title patient to improve R knee PROM to 0-120 (06/23/16)  Status Partially Met  3-120           PT Long Term Goals - 07/08/16 1422      PT LONG TERM GOAL #1   Title patient to be independent with advanced HEP (08/08/16)   Status On-going     PT LONG TERM GOAL #2   Title patient to improve R knee AROM to 0-120 (08/08/16)   Status On-going     PT LONG TERM GOAL #3   Title patient to demonstrate appropriate gait mechanics with good heel toe gait pattern with no AD (08/08/16)   Status On-going     PT LONG TERM GOAL #4   Title Patient to demonstrate no extensor lag with SLR (08/08/16)   Status On-going               Plan - 07/22/16 1324    Clinical Impression Statement Patient doing well today - reports she feels like her hip (replaced 28 years ago) is starting to feel loose - recommended patient to talk to her MD about this. Doing well with all strengthening today with ability to progress well without pain.    PT Treatment/Interventions ADLs/Self Care Home Management;Cryotherapy;Electrical Stimulation;Moist  Heat;Ultrasound;Neuromuscular re-education;Balance training;Therapeutic exercise;Therapeutic activities;Stair training;Gait training;Functional mobility training;Patient/family education;Manual techniques;Passive range of motion;Vasopneumatic Device;Taping;Dry needling   PT Next Visit Plan strength   Consulted and Agree with Plan of Care Patient      Patient will benefit from skilled therapeutic intervention in order to improve the following deficits and impairments:  Abnormal gait, Decreased activity tolerance, Decreased balance, Decreased range of motion, Decreased mobility, Decreased strength, Difficulty walking, Pain, Increased edema  Visit Diagnosis: Acute pain of right knee  Stiffness of right knee, not elsewhere classified  Difficulty in walking, not elsewhere classified  Other abnormalities of gait and mobility  Muscle weakness (generalized)     Problem List Patient Active Problem List   Diagnosis Date Noted  . OA (osteoarthritis) of knee 05/26/2016  . AKI (acute kidney injury) ([place]) 06/21/2014  . Dehydration 06/21/2014  . Syncope and collapse 06/21/2014  . Hypokalemia 06/21/2014  . Palpitation 06/21/2014  . HTN (hypertension) 05/07/2012  . Arthritis, rheumatoid ([place]) 05/07/2012     Lanney Gins, PT, DPT 07/22/16 3:44 PM   [facility] [address]  [place], [place], [address] Phone: [phone]   Fax:  [phone]  Name: Mallory Murphy MRN: [id number] Date of Birth: Jan 06, 1946

## 2016-07-24 ENCOUNTER — Ambulatory Visit: Payer: Medicare Other

## 2016-07-24 DIAGNOSIS — M6281 Muscle weakness (generalized): Secondary | ICD-10-CM

## 2016-07-24 DIAGNOSIS — R262 Difficulty in walking, not elsewhere classified: Secondary | ICD-10-CM

## 2016-07-24 DIAGNOSIS — M25661 Stiffness of right knee, not elsewhere classified: Secondary | ICD-10-CM

## 2016-07-24 DIAGNOSIS — M25561 Pain in right knee: Secondary | ICD-10-CM

## 2016-07-24 DIAGNOSIS — R2689 Other abnormalities of gait and mobility: Secondary | ICD-10-CM

## 2016-07-24 NOTE — Therapy (Signed)
[facility] [address]  [place], [place], [address] Phone: [phone]   Fax:  [phone]  Physical Therapy Treatment  Patient Details  Name: Mallory Murphy MRN: [id number] Date of Birth: [birth year] Referring Provider: Dr. Gaynelle Arabian  Encounter Date: 07/24/2016      PT End of Session - 07/24/16 1327    Visit Number 10   Number of Visits 14   Date for PT Re-Evaluation 08/08/16   Authorization Type Medicare   PT Start Time 1319  Pt. arrived late today.     PT Stop Time 1403   PT Time Calculation (min) 44 min   Activity Tolerance Patient tolerated treatment well   Behavior During Therapy WFL for tasks assessed/performed      Past Medical History:  Diagnosis Date  . Anemia    hx blood tranfusions  . Arthritis   . Diverticulosis   . DJD (degenerative joint disease) of cervical spine   . Hypertension     Past Surgical History:  Procedure Laterality Date  . ELBOW ARTHROPLASTY Right    X3   . JOINT REPLACEMENT     both hips  . 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 TOTAL SHOULDER ARTHROPLASTY Left     There were no vitals filed for this visit.      Subjective Assessment - 07/24/16 1325    Subjective Pt. reporting she feels she is getting stronger.     Patient Stated Goals regain mobility (walking)   Currently in Pain? No/denies   Pain Score 0-No pain   Multiple Pain Sites No            [facility] PT Assessment - 07/24/16 1338      Observation/Other Assessments   Focus on Therapeutic Outcomes (FOTO)  35% (65% limitation)      AROM   Right Knee Extension 2  with SLR    Right Knee Flexion 120                     [facility] Adult PT Treatment/Exercise - 07/24/16 1338      Ambulation/Gait   Ambulation/Gait Yes   Ambulation/Gait Assistance 5:  Supervision   Ambulation Distance (Feet) 200 Feet   Gait Pattern Step-through pattern;Decreased step length - left;Decreased stance time - right;Decreased weight shift to right;Antalgic   Gait Comments reporting pain free however with L wt. shift, shortened step length L and decreased stance time R; working on even stance time and even step length.  some cueing for upright posture     Knee/Hip Exercises: Aerobic   Recumbent Bike L2 x 6 minutes     Knee/Hip Exercises: Machines for Strengthening   Cybex Knee Extension 20# B LE x 15 rep;   rest after 10 reps   Cybex Knee Flexion 20# B LE x 15     Knee/Hip Exercises: Standing   Other Standing Knee Exercises Side stepping with green TB at counter x 2 laps down/back      Knee/Hip Exercises: Seated   Long Arc Quad Strengthening;Right;15 reps   Long Arc Quad Weight 4 lbs.   Long [employer] Limitations with adduction ball squeeze    Other Seated Knee/Hip Exercises R LE - fitter - 1 blue/1 black x 20 reps   Sit to Sand without UE support;2 sets;10 reps  from box with airex pad; only 4 reps 2nd set due to nausea     Knee/Hip Exercises: Supine   Bridges Both;20 reps   Bridges Limitations 5" hold    Straight Leg Raises Right;20 reps  first rep without weight; no extensor lag   Straight Leg Raises Limitations 4#                   PT Short Term Goals - 07/08/16 1422      PT SHORT TERM GOAL #1   Title patient to be independent wiht HEP (06/23/16)   Status Achieved     PT SHORT TERM GOAL #2   Title patient to improve R knee PROM to 0-120 (06/23/16)   Status Partially Met  3-120           PT Long Term Goals - 08/06/16 1354      PT LONG TERM GOAL #1   Title patient to be independent with advanced HEP (08/08/16)   Status On-going     PT LONG TERM GOAL #2   Title patient to improve R knee AROM to 0-120 (08/08/16)   Status Partially Met  08-06-2016: R knee AROM 2-120 dg      PT LONG TERM GOAL #3   Title patient to demonstrate  appropriate gait mechanics with good heel toe gait pattern with no AD (08/08/16)   Status On-going  Aug 06, 2016: still with shortened step length and L wt. shift      PT LONG TERM GOAL #4   Title Patient to demonstrate no extensor lag with SLR (08/08/16)   Status Achieved               Plan - Aug 06, 2016 1329    Clinical Impression Statement Pt. reporting she has not experienced any further problems or, "loose" sensations at hip since last treatment.  Pt. reporting she still feels she is getting stronger and wishes to continue with therapy.  Pt. able to demo SLR without extensor lag today however still 2-120 dg AROM.  Pt. able to walk without pain however still with decreased wt. shift to R and decreased stance time on R.  Pt. able to correct for short time after cueing however reverts to previous ambulation pattern.  Pt. progressing with strengthening activities well at this point and will continue to benefit from further skilled therapy to maximize functional strength and ROM.     PT Treatment/Interventions ADLs/Self Care Home Management;Cryotherapy;Electrical Stimulation;Moist Heat;Ultrasound;Neuromuscular re-education;Balance training;Therapeutic exercise;Therapeutic activities;Stair training;Gait training;Functional mobility training;Patient/family education;Manual techniques;Passive range of motion;Vasopneumatic Device;Taping;Dry needling   PT Next Visit Plan strength      Patient will benefit from skilled therapeutic intervention in order to improve the following deficits and impairments:  Abnormal gait, Decreased activity tolerance, Decreased balance, Decreased range of motion, Decreased mobility, Decreased strength, Difficulty walking, Pain, Increased edema  Visit Diagnosis: Acute pain of right knee  Stiffness of right knee, not elsewhere classified  Difficulty in walking, not elsewhere classified  Other abnormalities of gait and mobility  Muscle weakness (generalized)        G-Codes - 2016/08/06 1450    Functional Assessment Tool Used (Outpatient Only) FOTO: 35% (65% limitation)   Functional Limitation Mobility: Walking and moving around   Mobility: Walking and Moving Around Current Status (T0626) At least 60 percent but less than 80 percent impaired, limited or restricted   Mobility: Walking and Moving Around Goal Status (R4854) At least 40 percent but less than 60 percent impaired, limited or restricted  Problem List Patient Active Problem List   Diagnosis Date Noted  . OA (osteoarthritis) of knee 05/26/2016  . AKI (acute kidney injury) ([place]) 06/21/2014  . Dehydration 06/21/2014  . Syncope and collapse 06/21/2014  . Hypokalemia 06/21/2014  . Palpitation 06/21/2014  . HTN (hypertension) 05/07/2012  . Arthritis, rheumatoid ([place]) 05/07/2012    Bess Harvest, PTA 07/24/16 5:44 PM  [facility] [address]  [place], [place], [address] Phone: [phone]   Fax:  [phone]  Name: Mallory Murphy MRN: [id number] Date of Birth: 11-Nov-1945

## 2016-07-29 ENCOUNTER — Ambulatory Visit: Payer: Medicare Other

## 2016-07-29 DIAGNOSIS — M6281 Muscle weakness (generalized): Secondary | ICD-10-CM

## 2016-07-29 DIAGNOSIS — M25561 Pain in right knee: Secondary | ICD-10-CM

## 2016-07-29 DIAGNOSIS — R2689 Other abnormalities of gait and mobility: Secondary | ICD-10-CM

## 2016-07-29 DIAGNOSIS — M25661 Stiffness of right knee, not elsewhere classified: Secondary | ICD-10-CM

## 2016-07-29 DIAGNOSIS — R262 Difficulty in walking, not elsewhere classified: Secondary | ICD-10-CM

## 2016-07-29 NOTE — Therapy (Signed)
[facility] [address]  [place], [place], [address] Phone: [phone]   Fax:  [phone]  Physical Therapy Treatment  Patient Details  Name: Mallory Murphy MRN: [id number] Date of Birth: 03-13-1946 Referring Provider: Dr. Gaynelle Arabian   Encounter Date: 07/29/2016      PT End of Session - 07/29/16 1307    Visit Number 11   Number of Visits 14   Date for PT Re-Evaluation 08/08/16   Authorization Type Medicare   PT Start Time 1310   PT Stop Time 1403   PT Time Calculation (min) 53 min   Activity Tolerance Patient tolerated treatment well   Behavior During Therapy [facility] for tasks assessed/performed      Past Medical History:  Diagnosis Date  . Anemia    hx blood tranfusions  . Arthritis   . Diverticulosis   . DJD (degenerative joint disease) of cervical spine   . Hypertension     Past Surgical History:  Procedure Laterality Date  . ELBOW ARTHROPLASTY Right    X3   . JOINT REPLACEMENT     both hips  . 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 TOTAL SHOULDER ARTHROPLASTY Left     There were no vitals filed for this visit.      Subjective Assessment - 07/29/16 1307    Subjective Pt. reporting reporting upcoming MD appointment on 4.24.18    Patient Stated Goals regain mobility (walking)   Currently in Pain? Yes   Pain Score 1    Pain Location Knee   Pain Orientation Right;Lateral   Pain Descriptors / Indicators Aching   Pain Type Surgical pain   Pain Onset More than a month ago   Multiple Pain Sites No            [facility] PT Assessment - 07/29/16 1423      Assessment   Medical Diagnosis R TKA   Referring Provider Dr. Gaynelle Arabian    Onset Date/Surgical Date 05/26/16   Next MD Visit 08/05/16   Prior Therapy yes                     Country Walk  Adult PT Treatment/Exercise - 07/29/16 1314      Knee/Hip Exercises: Stretches   Gastroc Stretch Right;30 seconds;2 reps   Gastroc Stretch Limitations blue rocker     Knee/Hip Exercises: Aerobic   Nustep NuStep: level 5, 6 min      Knee/Hip Exercises: Machines for Strengthening   Cybex Knee Extension 25# B LE x 15 rep;    Cybex Knee Flexion 25# B LE x 15     Knee/Hip Exercises: Standing   Hip Flexion Both;Knee bent;15 reps   Hip Flexion Limitations at TM with green TB    Hip Abduction Right;15 reps;Knee straight   Abduction Limitations green tband; TM    Hip Extension Right;15 reps;Knee straight   Extension Limitations green tband; at [employer] Step Up Right;15 reps;Hand Hold: 1;Step Height: 6"     Knee/Hip Exercises: Seated   Long Arc Quad Strengthening;Right;15 reps  3" hold    Long Arc Quad Weight 3 lbs.   Long [employer] Limitations with adduction ball squeeze    Other Seated Knee/Hip Exercises R LE - fitter - 1 blue/1 black x 20 reps  Sit to Sand 1 set;20 reps;without UE support     Knee/Hip Exercises: Supine   Bridges Both;20 reps  SL with heels on peanut p-ball   Bridges Limitations 5" hold    Straight Leg Raises Right;20 reps   Straight Leg Raises Limitations 4#    Other Supine Knee/Hip Exercises L fitter extension (1 black band) x 10 reps; attempted R extension however pt. apprehensive thus terminated   Other Supine Knee/Hip Exercises --                PT Education - 07/29/16 1408    Education provided Yes   Education Details Heel raises, sit<>stand from chair without hands, green looped TB issued to pt. for supine hip ER activity    Person(s) Educated Patient   Methods Explanation;Demonstration;Verbal cues;Handout   Comprehension Verbalized understanding;Returned demonstration;Verbal cues required          PT Short Term Goals - 07/08/16 1422      PT SHORT TERM GOAL #1   Title patient to be independent wiht HEP (06/23/16)   Status Achieved      PT SHORT TERM GOAL #2   Title patient to improve R knee PROM to 0-120 (06/23/16)   Status Partially Met  3-120           PT Long Term Goals - 07/29/16 1419      PT LONG TERM GOAL #1   Title patient to be independent with advanced HEP (08/08/16)   Status On-going  4.17.18: met for current however admitting to poor adherence      PT LONG TERM GOAL #2   Title patient to improve R knee AROM to 0-120 (08/08/16)   Status Partially Met  4.12.18: R knee AROM 2-120 dg      PT LONG TERM GOAL #3   Title patient to demonstrate appropriate gait mechanics with good heel toe gait pattern with no AD (08/08/16)   Status On-going  4.12.18: still with shortened step length and L wt. shift      PT LONG TERM GOAL #4   Title Patient to demonstrate no extensor lag with SLR (08/08/16)   Status Achieved               Plan - 07/29/16 1310    Clinical Impression Statement Pt. doing well and tolerated increased resistance and difficulty with strengthening therex without issue today.  Pt. able to progress resistance with machine strengthening and step up to 6" step today.  Pt. still with visible instability at hip and knees with standing activity today however admitting to poor adherence to HEP.  Heel raises and sit<>stand without UE support added to HEP today with instruction to perform these when able.  Pt. progressing with strengthening activity well so far in therapy and with two more scheduled visits left in POC.    PT Treatment/Interventions ADLs/Self Care Home Management;Cryotherapy;Electrical Stimulation;Moist Heat;Ultrasound;Neuromuscular re-education;Balance training;Therapeutic exercise;Therapeutic activities;Stair training;Gait training;Functional mobility training;Patient/family education;Manual techniques;Passive range of motion;Vasopneumatic Device;Taping;Dry needling   PT Next Visit Plan strength      Patient will benefit from skilled therapeutic intervention in order to improve the  following deficits and impairments:  Abnormal gait, Decreased activity tolerance, Decreased balance, Decreased range of motion, Decreased mobility, Decreased strength, Difficulty walking, Pain, Increased edema  Visit Diagnosis: Acute pain of right knee  Stiffness of right knee, not elsewhere classified  Difficulty in walking, not elsewhere classified  Other abnormalities of gait and mobility  Muscle weakness (generalized)  Problem List Patient Active Problem List   Diagnosis Date Noted  . OA (osteoarthritis) of knee 05/26/2016  . AKI (acute kidney injury) ([place]) 06/21/2014  . Dehydration 06/21/2014  . Syncope and collapse 06/21/2014  . Hypokalemia 06/21/2014  . Palpitation 06/21/2014  . HTN (hypertension) 05/07/2012  . Arthritis, rheumatoid ([place]) 05/07/2012    Bess Harvest, PTA 07/29/16 2:24 PM   [facility] [address]  [place], [place], [address] Phone: [phone]   Fax:  [phone]  Name: ODESTER NILSON MRN: [id number] Date of Birth: [birth year]

## 2016-07-31 ENCOUNTER — Ambulatory Visit: Payer: Medicare Other | Admitting: Physical Therapy

## 2016-08-05 ENCOUNTER — Ambulatory Visit: Payer: Medicare Other

## 2016-08-07 ENCOUNTER — Ambulatory Visit: Payer: Medicare Other | Admitting: Physical Therapy

## 2016-08-07 DIAGNOSIS — M6281 Muscle weakness (generalized): Secondary | ICD-10-CM

## 2016-08-07 DIAGNOSIS — R262 Difficulty in walking, not elsewhere classified: Secondary | ICD-10-CM

## 2016-08-07 DIAGNOSIS — M25561 Pain in right knee: Secondary | ICD-10-CM | POA: Diagnosis not present

## 2016-08-07 DIAGNOSIS — M25661 Stiffness of right knee, not elsewhere classified: Secondary | ICD-10-CM

## 2016-08-07 DIAGNOSIS — R2689 Other abnormalities of gait and mobility: Secondary | ICD-10-CM

## 2016-08-07 NOTE — Patient Instructions (Signed)
Straight Leg Raise    Bend one leg. Raise other leg __6-8__ inches with knee locked. Exhale and tighten thigh muscles while raising leg. Repeat with other leg. Repeat __15__ times. Do __2__ sessions per day.   Bridge    Lie back, legs bent. Inhale, pressing hips up. Keeping ribs in, lengthen lower back. Exhale, rolling down along spine from top. Repeat __15__ times. Do __2__ sessions per day.   Hip Abductors (Side Lying)    Lie on side with bottom leg bent at knee.  Lift up and back __6__ inches in air. Keep upper hip rolled forward and knee straight.  Repeat __15__ times. Do __2__ sessions per day. CAUTION: Move slowly.   Squat: Double Leg (Supported)    With feet shoulder width apart, hold support. Squat, keeping lower leg vertical, knee in line with second toe. Use legs, do not pull up and down with arms. Repeat _15__ times per set.  Do _2__ sets per session.    Sit to Stand / Stand to Sit / Transfers    Sit on edge of a solid chair with arms, feet flat on floor. Lean forward over feet and stand up with hands on chair arms. Sit down slowly with hands on chair arms. Repeat __10-15__ times per session. Do _throughout___ day. **During commercials   Long Arc Quad    Straighten operated leg and try to hold it __3-5__ seconds.  Repeat __15__ times. Do __2__ sessions a day.   ABDUCTION: Standing (Active)    Stand, feet flat. Lift right leg out to side.  Complete _2__ sets of _15__ repetitions. Perform _2__ sessions per day.   EXTENSION: Standing (Active)    Stand, both feet flat. Draw right leg behind body as far as possible.  Complete _2__ sets of _15__ repetitions.    BALANCE 1. Feet together 2. Heel toe 3. Single leg balance

## 2016-08-07 NOTE — Therapy (Signed)
[facility] [address]  [place], [place], [address] Phone: [phone]   Fax:  [phone]  Physical Therapy Treatment  Patient Details  Name: Mallory Murphy MRN: [id number] Date of Birth: [birth year] Referring Provider: Dr. Gaynelle Arabian   Encounter Date: 08/07/2016      PT End of Session - 08/07/16 1633    Visit Number 12   Number of Visits 14   Date for PT Re-Evaluation 08/08/16   Authorization Type Medicare   PT Start Time 6712   PT Stop Time 4580  discharge.   PT Time Calculation (min) 29 min   Activity Tolerance Patient tolerated treatment well   Behavior During Therapy WFL for tasks assessed/performed      Past Medical History:  Diagnosis Date  . Anemia    hx blood tranfusions  . Arthritis   . Diverticulosis   . DJD (degenerative joint disease) of cervical spine   . Hypertension     Past Surgical History:  Procedure Laterality Date  . ELBOW ARTHROPLASTY Right    X3   . JOINT REPLACEMENT     both hips  . 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 TOTAL SHOULDER ARTHROPLASTY Left     There were no vitals filed for this visit.      Subjective Assessment - 08/07/16 1631    Subjective Good report from MD - MD pleased with progress and current function   Patient Stated Goals regain mobility (walking)   Currently in Pain? No/denies   Pain Score 0-No pain            [facility] PT Assessment - 08/07/16 0001      AROM   Right Knee Extension 0   Right Knee Flexion 118                     [facility] Adult PT Treatment/Exercise - 08/07/16 0001      Knee/Hip Exercises: Aerobic   Recumbent Bike L2 x 7 minutes     Knee/Hip Exercises: Standing   Hip Abduction Right;15 reps;Knee straight   Hip Extension Right;15 reps;Knee straight   Functional Squat  15 reps     Knee/Hip Exercises: Seated   Long Arc Quad Strengthening;Right;15 reps   Sit to [employer] 15 reps;without UE support     Knee/Hip Exercises: Supine   Bridges Both;15 reps   Straight Leg Raises Right;15 reps     Knee/Hip Exercises: Sidelying   Hip ABduction Strengthening;Right;15 reps                  PT Short Term Goals - 08/07/16 1634      PT SHORT TERM GOAL #1   Title patient to be independent wiht HEP (06/23/16)   Status Achieved     PT SHORT TERM GOAL #2   Title patient to improve R knee PROM to 0-120 (06/23/16)   Status Achieved           PT Long Term Goals - 08/07/16 1634      PT LONG TERM GOAL #1   Title patient to be independent with advanced HEP (08/08/16)   Status Achieved     PT LONG TERM GOAL #2   Title patient to improve R knee AROM to 0-120 (08/08/16)   Status Achieved     PT LONG  TERM GOAL #3   Title patient to demonstrate appropriate gait mechanics with good heel toe gait pattern with no AD (08/08/16)   Status Achieved     PT LONG TERM GOAL #4   Title Patient to demonstrate no extensor lag with SLR (08/08/16)   Status Achieved               Plan - Aug 27, 2016 1634    Clinical Impression Statement Zyan today doing very well - saw MD and pleased with all progress. Patient today meeting all established goals with good improvement in ROM, strength and gait mechanics as compared to initial evaluation. Review of comprehensive HEP today and given activities that she can easily do throughout day as she does report limited compliance with prior HEP handouts. Patient to be discharged from PT on this date and welcome to return to PT in the future for any other needs.    PT Treatment/Interventions ADLs/Self Care Home Management;Cryotherapy;Electrical Stimulation;Moist Heat;Ultrasound;Neuromuscular re-education;Balance training;Therapeutic exercise;Therapeutic activities;Stair training;Gait training;Functional mobility training;Patient/family  education;Manual techniques;Passive range of motion;Vasopneumatic Device;Taping;Dry needling   PT Next Visit Plan d/c on this date   Consulted and Agree with Plan of Care Patient      Patient will benefit from skilled therapeutic intervention in order to improve the following deficits and impairments:  Abnormal gait, Decreased activity tolerance, Decreased balance, Decreased range of motion, Decreased mobility, Decreased strength, Difficulty walking, Pain, Increased edema  Visit Diagnosis: Acute pain of right knee  Stiffness of right knee, not elsewhere classified  Difficulty in walking, not elsewhere classified  Other abnormalities of gait and mobility  Muscle weakness (generalized)       G-Codes - 27-Aug-2016 1637    Functional Assessment Tool Used (Outpatient Only) FOTO: 49 (51% limited)   Functional Limitation Mobility: Walking and moving around   Mobility: Walking and Moving Around Goal Status [phone]) At least 40 percent but less than 60 percent impaired, limited or restricted   Mobility: Walking and Moving Around Discharge Status [phone]) At least 40 percent but less than 60 percent impaired, limited or restricted      Problem List Patient Active Problem List   Diagnosis Date Noted  . OA (osteoarthritis) of knee 05/26/2016  . AKI (acute kidney injury) ([place]) 06/21/2014  . Dehydration 06/21/2014  . Syncope and collapse 06/21/2014  . Hypokalemia 06/21/2014  . Palpitation 06/21/2014  . HTN (hypertension) 05/07/2012  . Arthritis, rheumatoid ([place]) 05/07/2012    Lanney Gins, PT, DPT 08/27/16 4:38 PM   PHYSICAL THERAPY DISCHARGE SUMMARY  Visits from Start of Care: 12  Current functional level related to goals / functional outcomes: See above   Remaining deficits: See above   Education / Equipment: HEP  Plan: Patient agrees to discharge.  Patient goals were met. Patient is being discharged due to meeting the stated rehab goals.  ?????     Lanney Gins, PT, DPT 08-27-2016 4:39 PM    [facility] [address]  [place], [place], [address] Phone: [phone]   Fax:  [phone]  Name: Mallory Murphy MRN: [id number] Date of Birth: 1945/08/04

## 2016-09-15 NOTE — Anesthesia Postprocedure Evaluation (Signed)
Anesthesia Post Note  Patient: Mallory Murphy  Procedure(s) Performed: Procedure(s) (LRB): RIGHT TOTAL KNEE ARTHROPLASTY (Right)     Anesthesia Post Evaluation  Last Vitals:  Vitals:   05/28/16 0538 05/28/16 0809  BP: (!) 187/70 (!) 172/70  Pulse: 84 88  Resp: 18   Temp: 37.5 C     Last Pain:  Vitals:   05/28/16 1206  TempSrc:   PainSc: 3                  Kymberli Wiegand S

## 2016-09-15 NOTE — Addendum Note (Signed)
Addendum  created 09/15/16 1107 by Myrtie Soman, MD   Sign clinical note

## 2016-10-20 ENCOUNTER — Ambulatory Visit: Payer: Medicare Other | Attending: Physician Assistant | Admitting: Physical Therapy

## 2016-10-20 DIAGNOSIS — R2689 Other abnormalities of gait and mobility: Secondary | ICD-10-CM | POA: Insufficient documentation

## 2016-10-20 DIAGNOSIS — M6281 Muscle weakness (generalized): Secondary | ICD-10-CM | POA: Insufficient documentation

## 2016-10-20 DIAGNOSIS — M25561 Pain in right knee: Secondary | ICD-10-CM | POA: Insufficient documentation

## 2016-10-20 DIAGNOSIS — M25661 Stiffness of right knee, not elsewhere classified: Secondary | ICD-10-CM | POA: Insufficient documentation

## 2016-10-23 ENCOUNTER — Ambulatory Visit: Payer: Medicare Other | Admitting: Physical Therapy

## 2016-10-23 DIAGNOSIS — R2689 Other abnormalities of gait and mobility: Secondary | ICD-10-CM | POA: Diagnosis present

## 2016-10-23 DIAGNOSIS — M25661 Stiffness of right knee, not elsewhere classified: Secondary | ICD-10-CM | POA: Diagnosis present

## 2016-10-23 DIAGNOSIS — M6281 Muscle weakness (generalized): Secondary | ICD-10-CM

## 2016-10-23 DIAGNOSIS — M25561 Pain in right knee: Secondary | ICD-10-CM | POA: Diagnosis present

## 2016-10-23 NOTE — Therapy (Signed)
[facility] [address]  [place], [place], [address] Phone: [phone]   Fax:  [phone]  Physical Therapy Evaluation  Patient Details  Name: Mallory Murphy MRN: [id number] Date of Birth: June 04, 1945 Referring Provider: Judith Part. [place], [place]  Encounter Date: 10/23/2016      PT End of Session - 10/23/16 1019    Visit Number 1   Number of Visits 8   Date for PT Re-Evaluation 11/21/16   Authorization Type UHC Medicare   PT Start Time 1019   PT Stop Time 1104   PT Time Calculation (min) 45 min   Activity Tolerance Patient tolerated treatment well   Behavior During Therapy WFL for tasks assessed/performed      Past Medical History:  Diagnosis Date  . Anemia    hx blood tranfusions  . Arthritis   . Diverticulosis   . DJD (degenerative joint disease) of cervical spine   . Hypertension     Past Surgical History:  Procedure Laterality Date  . ELBOW ARTHROPLASTY Right    X3   . JOINT REPLACEMENT     both hips  . 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 TOTAL SHOULDER ARTHROPLASTY Left     There were no vitals filed for this visit.       Subjective Assessment - 10/23/16 1021    Subjective Pt reporting as the sensation returned in her R knee following TKR, she began noting increased pain and "crunching" especially with sit <> stand transitions. Does not typically bother her while walking. Pt reports MD told her that this was due to scar tissue.   Pertinent History R TKR 05/26/16   Patient Stated Goals "get up and down w/o knee pain"   Currently in Pain? No/denies   Pain Score 0-No pain  6/10 briefly during transitions where bending/straightening knee   Pain Location Knee   Pain Orientation Right;Anterior;Lateral   Pain Descriptors / Indicators Sharp  "intense"    Pain Type Acute pain   Pain Onset More than a month ago   Pain Frequency Intermittent   Aggravating Factors  sit <> stand transitions   Pain Relieving Factors meds - takes tramadol   Effect of Pain on Daily Activities difficulty with transfers - requiring increased UE assist            [facility] PT Assessment - 10/23/16 1019      Assessment   Medical Diagnosis R patellar clunk syndrome following TKA   Referring Provider Judith Part. [place], PA-C   Onset Date/Surgical Date 05/26/16  R TKA   Next MD Visit ~6 wks   Prior Therapy OP PT 06/09/16 - 08/07/16 following R TKA     Balance Screen   Has the patient fallen in the past 6 months No   Has the patient had a decrease in activity level because of a fear of falling?  No   Is the patient reluctant to leave their home because of a fear of falling?  No     Home Environment   Living Environment Private residence   Living Arrangements Alone   Available Help at Discharge Family   Type of [place] Access Level entry   [place] - 2 wheels     Prior Function  Level of Independence Independent   Vocation Retired   Leisure read, family, most sedentary     Observation/Other Assessments   Focus on Therapeutic Outcomes (FOTO)  Knee - 33% (67% limitation); predicted 50% (50% limitation)     AROM   Right Knee Extension 3   Right Knee Flexion 115     Strength   Right Hip Flexion 4-/5   Right Hip Extension 3-/5   Right Hip ABduction 4/5   Right Hip ADduction 4-/5   Left Hip Flexion 4/5   Left Hip Extension 3-/5   Left Hip ABduction 4+/5   Left Hip ADduction 4/5   Right Knee Flexion 4+/5   Right Knee Extension 4-/5  pain   Left Knee Flexion 4+/5   Left Knee Extension 4+/5     Flexibility   Soft Tissue Assessment /Muscle Length yes   Hamstrings R mod tight   Quadriceps B mod tight quad (esp RF) and hip flexors   ITB R mild/mod tight   Piriformis R mod/severe tight (h/o THR)      Palpation   Patella mobility severely restricted R patella mobility in all planes     Ambulation/Gait   Gait Pattern Step-through pattern;Decreased step length - left;Decreased stance time - right;Decreased weight shift to right;Antalgic  decreased gait velocity            Objective measurements completed on examination: See above findings.          [facility] Adult PT Treatment/Exercise - 10/23/16 1019      Exercises   Exercises Knee/Hip     Knee/Hip Exercises: Stretches   Passive Hamstring Stretch Right;30 seconds;1 rep   Passive Hamstring Stretch Limitations supine with strap   Quad Stretch Right;30 seconds;1 rep   Sports administrator Limitations RF - mod thomas with strap   ITB Stretch Right;30 seconds;1 rep     Knee/Hip Exercises: Supine   Bridges Both;10 reps   Bridges Limitations 5" hold                 PT Education - 10/23/16 1100    Education provided Yes   Education Details PT eval findings, POC and initial HEP   Person(s) Educated Patient   Methods Explanation;Demonstration;Verbal cues;Handout   Comprehension Verbalized understanding;Returned demonstration;Verbal cues required;Need further instruction          PT Short Term Goals - 10/23/16 1104      PT SHORT TERM GOAL #1   Title refer to [place] - 10/23/16 1104      PT LONG TERM GOAL #1   Title Independent with ongoing HEP by 11/21/16   Status New     PT LONG TERM GOAL #2   Title Pt will improve R patellar mobility to allow for R knee AROM to 0-120 w/o increased pain by 11/21/16   Status New     PT LONG TERM GOAL #3   Title Pt will perform sit to stand transfer with minimal to no UE assist w/o increased R knee pain by 11/21/16   Status New                Plan - 10/23/16 1104    Clinical Impression Statement Mallory Murphy is a 71 y/o female who is returning to OP PT for R knee pain and patellar clunk syndrome following R TKA on 05/26/16. Pt reports onset of  pain noted as sensation returned post-operatively and reports "  crunching" sensation in anterior-lateral knee with sit <> stand transitions, with pain less common while walking. Pt previously received OP PT from 06/09/16 - 08/07/16 with good progress demonstrated and all goals met by end of episode. Pt discharged with comprehensive HEP but admits to limited compliance with home exercises. Assessment reveals slight decline in R knee AROM to 3-115 from 0-118 as of discharge from PT on 08/07/16. Significant muscular tightness noted in all proximal LE muscles, esp TFL/ITB, hamstrings, quads (esp rectus femoris) and hip flexors with severely hypomobile R patella in all directions. Pain and "crunching"/grinding at R knee requires pt rely heavily on UE assist for sit to stand transfers. Patient demonstrates good potential to benefit from skilled PT to address flexibility/muscle tension along with R patellar mobility, ROM and strength deficits to allow for increased ease of mobility for improved activity tolerance and return to normal daily activities w/o limitation due to pain.   History and Personal Factors relevant to plan of care: RA & OA with extensive DJD of cervical spine, shoulders, hips and knees - s/p B THA & L TSA; HTN   Clinical Presentation Evolving   Clinical Presentation due to: worsening of R knee pain and decreasing mobility tolerance with need for increased UE assist   Clinical Decision Making Moderate   Rehab Potential Good   Clinical Impairments Affecting Rehab Potential RA & OA with extensive DJD of cervical spine, shoulders, hips and knees - s/p B THA & L TSA; HTN   PT Frequency 2x / week   PT Duration 4 weeks   PT Treatment/Interventions Patient/family education;ADLs/Self Care Home Management;Manual techniques;Passive range of motion;Dry needling;Taping;Therapeutic exercise;Neuromuscular re-education;Therapeutic activities;Functional mobility training;Gait training;Stair training;Balance  training;Electrical Stimulation;Moist Heat;Iontophoresis 50m/ml Dexamethasone;Ultrasound   PT Next Visit Plan Review HEP; R knee scar massage & patellar mobility; R LE flexbility; hip/knee strengthening; modalities PRN for pain   Consulted and Agree with Plan of Care Patient      Patient will benefit from skilled therapeutic intervention in order to improve the following deficits and impairments:  Pain, Decreased scar mobility, Hypomobility, Impaired flexibility, Decreased range of motion, Decreased strength, Decreased mobility, Decreased activity tolerance, Difficulty walking  Visit Diagnosis: Acute pain of right knee  Stiffness of right knee, not elsewhere classified  Other abnormalities of gait and mobility  Muscle weakness (generalized)      G-Codes - 0Aug 01, 20181211    Functional Assessment Tool Used (Outpatient Only) Knee FOTO = 33% (67% limitation)   Functional Limitation Mobility: Walking and moving around   Mobility: Walking and Moving Around Current Status (([id number] At least 60 percent but less than 80 percent impaired, limited or restricted   Mobility: Walking and Moving Around Goal Status [phone] At least 40 percent but less than 60 percent impaired, limited or restricted       Problem List Patient Active Problem List   Diagnosis Date Noted  . OA (osteoarthritis) of knee 05/26/2016  . [place] ([place] kidney injury) ([place] 06/21/2014  . Dehydration 06/21/2014  . Syncope and collapse 06/21/2014  . Hypokalemia 06/21/2014  . Palpitation 06/21/2014  . HTN (hypertension) 05/07/2012  . Arthritis, rheumatoid ([place] 05/07/2012    JPercival Spanish PT, MPT 708/01/18 7:04 PM  [facility] [place] [address]Phone: [phone]  Fax:  [phone] Name: BKHOLE ARTERBURNMRN: [id number] of Birth: 121-Oct-1947

## 2016-10-28 ENCOUNTER — Ambulatory Visit: Payer: Medicare Other

## 2016-10-28 DIAGNOSIS — M6281 Muscle weakness (generalized): Secondary | ICD-10-CM

## 2016-10-28 DIAGNOSIS — R2689 Other abnormalities of gait and mobility: Secondary | ICD-10-CM

## 2016-10-28 DIAGNOSIS — M25661 Stiffness of right knee, not elsewhere classified: Secondary | ICD-10-CM

## 2016-10-28 DIAGNOSIS — M25561 Pain in right knee: Secondary | ICD-10-CM | POA: Diagnosis not present

## 2016-10-28 NOTE — Therapy (Signed)
[facility] [address]  [place], [place], [address] Phone: [phone]   Fax:  [phone]  Physical Therapy Treatment  Patient Details  Name: Mallory Murphy MRN: [id number] Date of Birth: Apr 03, 1946 Referring Provider: Judith Part. [place], [place]  Encounter Date: 10/28/2016      PT End of Session - 10/28/16 1503    Visit Number 2   Number of Visits 8   Date for PT Re-Evaluation 11/21/16   Authorization Type UHC Medicare   PT Start Time 4193   PT Stop Time 1534   PT Time Calculation (min) 38 min   Activity Tolerance Patient tolerated treatment well   Behavior During Therapy WFL for tasks assessed/performed      Past Medical History:  Diagnosis Date  . Anemia    hx blood tranfusions  . Arthritis   . Diverticulosis   . DJD (degenerative joint disease) of cervical spine   . Hypertension     Past Surgical History:  Procedure Laterality Date  . ELBOW ARTHROPLASTY Right    X3   . JOINT REPLACEMENT     both hips  . 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 TOTAL SHOULDER ARTHROPLASTY Left     There were no vitals filed for this visit.      Subjective Assessment - 10/28/16 1500    Subjective Mallory Murphy doing well today.  Reports she tried "most of the exercises".     Patient Stated Goals "get up and down w/o knee pain"   Currently in Pain? Yes   Pain Score 2    Pain Location Knee   Pain Orientation Right   Pain Descriptors / Indicators Aching   Pain Type Acute pain   Pain Onset More than a month ago   Multiple Pain Sites No                         [facility] Adult PT Treatment/Exercise - 10/28/16 1511      Knee/Hip Exercises: Stretches   Passive Hamstring Stretch Right;30 seconds;1 rep   Passive Hamstring Stretch Limitations supine with strap   Quad  Stretch Right;30 seconds;1 rep   Sports administrator Limitations RF - mod thomas with strap   ITB Stretch Right;30 seconds;1 rep     Knee/Hip Exercises: Aerobic   Recumbent Bike L2 x 6 minutes     Knee/Hip Exercises: Seated   Long Arc Quad Strengthening;Right;15 reps  3" hold    Long Arc Quad Weight 2 lbs.   Long [employer] Limitations with adduction ball squeeze    Heel Slides --     Knee/Hip Exercises: Supine   Short Arc [employer] Right;15 reps  5" hold    Short Arc Quad Sets Limitations 2#    Bridges Both;10 reps   Bridges Limitations 5" hold    Straight Leg Raises Right;15 reps  5" hold    Straight Leg Raises Limitations 2#      Manual Therapy   Manual Therapy Passive ROM;Joint mobilization;Soft tissue mobilization   Joint Mobilization Patellar mobs inferior/superior and medial glides    Soft tissue mobilization R scar massage    Passive ROM Passive R ITB, HS, sidelying ITB stretch with therapist x 30 sec each way  PT Short Term Goals - 10/23/16 1104      PT SHORT TERM GOAL #1   Title refer to [place] - 10/28/16 1513      PT LONG TERM GOAL #1   Title Independent with ongoing HEP by 11/21/16   Status On-going     PT LONG TERM GOAL #2   Title Pt will improve R patellar mobility to allow for R knee AROM to 0-120 w/o increased pain by 11/21/16   Status On-going     PT LONG TERM GOAL #3   Title Pt will perform sit to stand transfer with minimal to no UE assist w/o increased R knee pain by 11/21/16   Status On-going     PT LONG TERM GOAL #4   Status On-going               Plan - 10/28/16 1514    Clinical Impression Statement Pt. doing well today however arriving late to therapy thus treatment time limited.  Noting she performed, "most of the exercises" since evaluation.  Pt. noting decrease in R knee pain with LAQ with therapist patellar pressure into medial glide during movement.   Treatment focusing on R LE  strengthening and flexibility with manual work to improve patellar mobility.  TKE/VMO strengthening activities with review of rolling pin STM to lateral musculature today.  Pt. tolerated all activities well and encouraged to consistently perform full HEP over upcoming beach vacation.       PT Treatment/Interventions Patient/family education;ADLs/Self Care Home Management;Manual techniques;Passive range of motion;Dry needling;Taping;Therapeutic exercise;Neuromuscular re-education;Therapeutic activities;Functional mobility training;Gait training;Stair training;Balance training;Electrical Stimulation;Moist Heat;Iontophoresis 4mg /ml Dexamethasone;Ultrasound   PT Next Visit Plan R knee scar massage & patellar mobility; R LE flexbility; hip/knee strengthening; modalities PRN for pain      Patient will benefit from skilled therapeutic intervention in order to improve the following deficits and impairments:  Pain, Decreased scar mobility, Hypomobility, Impaired flexibility, Decreased range of motion, Decreased strength, Decreased mobility, Decreased activity tolerance, Difficulty walking  Visit Diagnosis: Acute pain of right knee  Stiffness of right knee, not elsewhere classified  Other abnormalities of gait and mobility  Muscle weakness (generalized)     Problem List Patient Active Problem List   Diagnosis Date Noted  . OA (osteoarthritis) of knee 05/26/2016  . AKI (acute kidney injury) ([place]) 06/21/2014  . Dehydration 06/21/2014  . Syncope and collapse 06/21/2014  . Hypokalemia 06/21/2014  . Palpitation 06/21/2014  . HTN (hypertension) 05/07/2012  . Arthritis, rheumatoid ([place]) 05/07/2012    Bess Harvest, PTA 10/28/16 3:58 PM  [facility] [address]  [place], [place], [address] Phone: [phone]   Fax:  [phone]  Name: Mallory Murphy MRN: [id number] Date of Birth: 04-01-1946

## 2016-11-03 ENCOUNTER — Ambulatory Visit: Payer: Medicare Other

## 2016-11-07 ENCOUNTER — Ambulatory Visit: Payer: Medicare Other | Admitting: Physical Therapy

## 2016-11-07 DIAGNOSIS — R2689 Other abnormalities of gait and mobility: Secondary | ICD-10-CM

## 2016-11-07 DIAGNOSIS — M25661 Stiffness of right knee, not elsewhere classified: Secondary | ICD-10-CM

## 2016-11-07 DIAGNOSIS — M25561 Pain in right knee: Secondary | ICD-10-CM

## 2016-11-07 DIAGNOSIS — M6281 Muscle weakness (generalized): Secondary | ICD-10-CM

## 2016-11-07 NOTE — Therapy (Signed)
[facility] [address]  [place], [place], [address] Phone: [phone]   Fax:  [phone]  Physical Therapy Treatment  Patient Details  Name: Mallory Murphy MRN: [id number] Date of Birth: 1945-12-24 Referring Provider: Judith Part. [place], [place]  Encounter Date: 11/07/2016      PT End of Session - 11/07/16 1108    Visit Number 3   Number of Visits 8   Date for PT Re-Evaluation 11/21/16   Authorization Type UHC Medicare   PT Start Time 1108  pt arrived late   PT Stop Time 1146   PT Time Calculation (min) 38 min   Activity Tolerance Patient tolerated treatment well   Behavior During Therapy WFL for tasks assessed/performed      Past Medical History:  Diagnosis Date  . Anemia    hx blood tranfusions  . Arthritis   . Diverticulosis   . DJD (degenerative joint disease) of cervical spine   . Hypertension     Past Surgical History:  Procedure Laterality Date  . ELBOW ARTHROPLASTY Right    X3   . JOINT REPLACEMENT     both hips  . 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 TOTAL SHOULDER ARTHROPLASTY Left     There were no vitals filed for this visit.      Subjective Assessment - 11/07/16 1110    Subjective Pt with difficulty determining number on pain scale - stating "just guessing a number", but states mild pain "all the time" in the R knee with more intense pain during sit to stand transitions.   Pertinent History R TKR 05/26/16   Patient Stated Goals "get up and down w/o knee pain"   Currently in Pain? Yes   Pain Score 2    Pain Location Knee   Pain Orientation Right   Pain Descriptors / Indicators Aching   Pain Type Acute pain   Pain Onset More than a month ago                         [facility] Adult PT Treatment/Exercise - 11/07/16 1108      Knee/Hip Exercises: Aerobic   Recumbent Bike L2 x 6'     Knee/Hip Exercises: Standing   Hip Extension Right;15 reps;Knee straight   Functional Squat 15 reps;3 seconds     Knee/Hip Exercises: Seated   Long Arc Quad Right;15 reps;Weights;Strengthening   Long Arc Quad Weight 3 lbs.   Long Arc Quad Limitations + adduction ball squeeze      Knee/Hip Exercises: Supine   Short Arc [employer] Right;15 reps;Strengthening   Short Arc Quad Sets Limitations 3# + adduction ball squeeze   Bridges with [employer] Both;15 reps   Straight Leg Raise with External Rotation Both;15 reps;Strengthening   Straight Leg Raise with External Rotation Limitations 3#     Manual Therapy   Manual Therapy Soft tissue mobilization;Taping   Soft tissue mobilization strumming to R ITB   Kinesiotex Create Space     Kinesiotix   Create Space R ITB - 30% from just distal to ITB insertion to greater trochanter                  PT Short Term Goals - 10/23/16 1104      PT SHORT TERM GOAL #1  Title refer to [place] - 10/28/16 1513      PT LONG TERM GOAL #1   Title Independent with ongoing HEP by 11/21/16   Status On-going     PT LONG TERM GOAL #2   Title Pt will improve R patellar mobility to allow for R knee AROM to 0-120 w/o increased pain by 11/21/16   Status On-going     PT LONG TERM GOAL #3   Title Pt will perform sit to stand transfer with minimal to no UE assist w/o increased R knee pain by 11/21/16   Status On-going     PT LONG TERM GOAL #4   Status On-going               Plan - 11/07/16 1114    Clinical Impression Statement Tightness persists in R ITB creating a lateral pull on patella, therefore focused STM on reducing tension in ITB with taping applied to promote further relaxation. Strengthening exercises emphasizing medial quads to promote improved patellar tracking.    Rehab Potential Good   Clinical Impairments Affecting Rehab Potential RA &  OA with extensive DJD of cervical spine, shoulders, hips and knees - s/p B THA & L TSA; HTN   PT Treatment/Interventions Patient/family education;ADLs/Self Care Home Management;Manual techniques;Passive range of motion;Dry needling;Taping;Therapeutic exercise;Neuromuscular re-education;Therapeutic activities;Functional mobility training;Gait training;Stair training;Balance training;Electrical Stimulation;Moist Heat;Iontophoresis 4mg /ml Dexamethasone;Ultrasound   PT Next Visit Plan assess response to taping; R knee scar massage & patellar mobility; R LE flexbility; hip/knee strengthening; modalities PRN for pain   Consulted and Agree with Plan of Care Patient      Patient will benefit from skilled therapeutic intervention in order to improve the following deficits and impairments:  Pain, Decreased scar mobility, Hypomobility, Impaired flexibility, Decreased range of motion, Decreased strength, Decreased mobility, Decreased activity tolerance, Difficulty walking  Visit Diagnosis: Acute pain of right knee  Stiffness of right knee, not elsewhere classified  Other abnormalities of gait and mobility  Muscle weakness (generalized)     Problem List Patient Active Problem List   Diagnosis Date Noted  . OA (osteoarthritis) of knee 05/26/2016  . AKI (acute kidney injury) ([place]) 06/21/2014  . Dehydration 06/21/2014  . Syncope and collapse 06/21/2014  . Hypokalemia 06/21/2014  . Palpitation 06/21/2014  . HTN (hypertension) 05/07/2012  . Arthritis, rheumatoid ([place]) 05/07/2012    Percival Spanish, PT, MPT 11/07/2016, 12:01 PM  [facility] [address]  [place], [place], [address] Phone: [phone]   Fax:  [phone]  Name: CHANDRA ASHER MRN: [id number] Date of Birth: 1946/03/28

## 2016-11-10 ENCOUNTER — Ambulatory Visit: Payer: Medicare Other

## 2016-11-10 DIAGNOSIS — M25561 Pain in right knee: Secondary | ICD-10-CM

## 2016-11-10 DIAGNOSIS — R2689 Other abnormalities of gait and mobility: Secondary | ICD-10-CM

## 2016-11-10 DIAGNOSIS — M25661 Stiffness of right knee, not elsewhere classified: Secondary | ICD-10-CM

## 2016-11-10 DIAGNOSIS — M6281 Muscle weakness (generalized): Secondary | ICD-10-CM

## 2016-11-10 NOTE — Therapy (Signed)
[facility] [address]  [place], [place], [address] Phone: [phone]   Fax:  [phone]  Physical Therapy Treatment  Patient Details  Name: Mallory Murphy MRN: [id number] Date of Birth: 07/14/1945 Referring Provider: Judith Part. [place], [place]  Encounter Date: 11/10/2016      PT End of Session - 11/10/16 1424    Visit Number 4   Number of Visits 8   Date for PT Re-Evaluation 11/21/16   Authorization Type UHC Medicare   PT Start Time 1400   PT Stop Time 1443   PT Time Calculation (min) 43 min   Activity Tolerance Patient tolerated treatment well   Behavior During Therapy WFL for tasks assessed/performed      Past Medical History:  Diagnosis Date  . Anemia    hx blood tranfusions  . Arthritis   . Diverticulosis   . DJD (degenerative joint disease) of cervical spine   . Hypertension     Past Surgical History:  Procedure Laterality Date  . ELBOW ARTHROPLASTY Right    X3   . JOINT REPLACEMENT     both hips  . 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 TOTAL SHOULDER ARTHROPLASTY Left     There were no vitals filed for this visit.      Subjective Assessment - 11/10/16 1431    Subjective Pt. notes limited benefit from taping.     Patient Stated Goals "get up and down w/o knee pain"   Currently in Pain? Yes   Pain Score 4    Pain Location Knee   Pain Orientation Right   Pain Descriptors / Indicators Aching   Pain Type Acute pain   Pain Onset More than a month ago   Pain Frequency Intermittent   Aggravating Factors  sit<>standing transitions   Pain Relieving Factors tramadol    Multiple Pain Sites No                         [facility] Adult PT Treatment/Exercise - 11/10/16 1426      Knee/Hip Exercises: Stretches   Passive Hamstring Stretch  Right;30 seconds;1 rep   Passive Hamstring Stretch Limitations supine with strap   Quad Stretch Right;30 seconds;1 rep   Sports administrator Limitations RF - mod thomas with strap   ITB Stretch Right;30 seconds;3 reps  supine with strap and sidelying with therapist      Knee/Hip Exercises: Aerobic   Recumbent Bike L2 x 6'     Knee/Hip Exercises: Standing   Terminal Knee Extension Limitations Standing R TKE with ball against wall 5" x 10 reps  cues required for R wt. shift and full quad contraction   Other Standing Knee Exercises Standing R TKE with blue TB closed in door 5" x 15 reps     Knee/Hip Exercises: Seated   Long Arc Quad Right;15 reps;Weights;Strengthening   Long Arc Quad Weight 3 lbs.   Long Arc Quad Limitations + adduction ball squeeze    Ball Squeeze 5" x 15 reps      Knee/Hip Exercises: Supine   Short Arc [employer] --   Short Arc Quad Sets Limitations --   Straight Leg Raises Right;15 reps   Straight Leg Raises Limitations 3#      Manual Therapy   Manual Therapy Soft tissue mobilization  Soft tissue mobilization strumming to R ITB   Passive ROM Gentle R ITB stretch with therapist in sidelying with light overpressure x 1 min                   PT Short Term Goals - 10/23/16 1104      PT SHORT TERM GOAL #1   Title refer to [place] - 10/28/16 1513      PT LONG TERM GOAL #1   Title Independent with ongoing HEP by 11/21/16   Status On-going     PT LONG TERM GOAL #2   Title Pt will improve R patellar mobility to allow for R knee AROM to 0-120 w/o increased pain by 11/21/16   Status On-going     PT LONG TERM GOAL #3   Title Pt will perform sit to stand transfer with minimal to no UE assist w/o increased R knee pain by 11/21/16   Status On-going     PT LONG TERM GOAL #4   Status On-going               Plan - 11/10/16 2155    Clinical Impression Statement Pt. noting limited benefit from taping to R ITB last treatment  thus taping deffered today.  Pt. tolerated all LE strengthening focused on VMO/quad well today.  Still very limited patellar mobility today thus stretching and STM/strumming to lateral hip musculature.  Pt. admitting to partial adherence to HEP today.     PT Treatment/Interventions Patient/family education;ADLs/Self Care Home Management;Manual techniques;Passive range of motion;Dry needling;Taping;Therapeutic exercise;Neuromuscular re-education;Therapeutic activities;Functional mobility training;Gait training;Stair training;Balance training;Electrical Stimulation;Moist Heat;Iontophoresis 4mg /ml Dexamethasone;Ultrasound   PT Next Visit Plan R knee scar massage & patellar mobility; R LE flexbility; hip/knee strengthening; modalities PRN for pain      Patient will benefit from skilled therapeutic intervention in order to improve the following deficits and impairments:  Pain, Decreased scar mobility, Hypomobility, Impaired flexibility, Decreased range of motion, Decreased strength, Decreased mobility, Decreased activity tolerance, Difficulty walking  Visit Diagnosis: Acute pain of right knee  Stiffness of right knee, not elsewhere classified  Other abnormalities of gait and mobility  Muscle weakness (generalized)     Problem List Patient Active Problem List   Diagnosis Date Noted  . OA (osteoarthritis) of knee 05/26/2016  . AKI (acute kidney injury) ([place]) 06/21/2014  . Dehydration 06/21/2014  . Syncope and collapse 06/21/2014  . Hypokalemia 06/21/2014  . Palpitation 06/21/2014  . HTN (hypertension) 05/07/2012  . Arthritis, rheumatoid ([place]) 05/07/2012    Bess Harvest, PTA 11/11/16 12:15 PM  [facility] [address]  [place], [place], [address] Phone: [phone]   Fax:  [phone]  Name: Mallory Murphy MRN: [id number] Date of Birth: 25-Aug-1945

## 2016-11-13 ENCOUNTER — Ambulatory Visit: Payer: Medicare Other | Attending: Physician Assistant | Admitting: Physical Therapy

## 2016-11-13 DIAGNOSIS — M25561 Pain in right knee: Secondary | ICD-10-CM

## 2016-11-13 DIAGNOSIS — M25661 Stiffness of right knee, not elsewhere classified: Secondary | ICD-10-CM | POA: Diagnosis present

## 2016-11-13 DIAGNOSIS — M6281 Muscle weakness (generalized): Secondary | ICD-10-CM

## 2016-11-13 DIAGNOSIS — R262 Difficulty in walking, not elsewhere classified: Secondary | ICD-10-CM | POA: Diagnosis present

## 2016-11-13 DIAGNOSIS — R2689 Other abnormalities of gait and mobility: Secondary | ICD-10-CM

## 2016-11-13 NOTE — Patient Instructions (Signed)

## 2016-11-13 NOTE — Therapy (Addendum)
[facility] [address]  [place], [place], [address] Phone: [phone]   Fax:  [phone]  Physical Therapy Treatment  Patient Details  Name: Mallory Murphy MRN: [id number] Date of Birth: 02-Aug-1945 Referring Provider: Judith Part. [place], [place]  Encounter Date: 11/13/2016      PT End of Session - 11/13/16 1400    Visit Number 5   Number of Visits 8   Date for PT Re-Evaluation 11/21/16   Authorization Type UHC Medicare   PT Start Time 1400   PT Stop Time 1452   PT Time Calculation (min) 52 min   Activity Tolerance Patient tolerated treatment well   Behavior During Therapy WFL for tasks assessed/performed      Past Medical History:  Diagnosis Date  . Anemia    hx blood tranfusions  . Arthritis   . Diverticulosis   . DJD (degenerative joint disease) of cervical spine   . Hypertension     Past Surgical History:  Procedure Laterality Date  . ELBOW ARTHROPLASTY Right    X3   . JOINT REPLACEMENT     both hips  . 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 TOTAL SHOULDER ARTHROPLASTY Left     There were no vitals filed for this visit.      Subjective Assessment - 11/13/16 1411    Subjective Pt reports she has removed the tape - some benefit noted but still feels tight in ITB.   Patient Stated Goals "get up and down w/o knee pain"   Currently in Pain? Yes   Pain Score 3   during transitions   Pain Location Knee   Pain Orientation Right   Pain Descriptors / Indicators Aching   Pain Frequency Intermittent                         [facility] Adult PT Treatment/Exercise - 11/13/16 1400      Knee/Hip Exercises: Aerobic   Recumbent Bike L2 x 6'     Knee/Hip Exercises: Standing   Hip Flexion Right;10 reps;Knee straight;Stengthening   Hip Flexion  Limitations red TB, UE support on back of chair   Hip ADduction Right;10 reps;Strengthening   Hip ADduction Limitations red TB, UE support on back of chair   Hip Abduction Right;10 reps;Knee straight;Stengthening  2 sets   Abduction Limitations 3# x10, red TB x10; UE support on back of chair   Hip Extension Right;10 reps;Knee straight;Stengthening  2 sets   Extension Limitations 3# x10, red TB x10; UE support on back of chair     Knee/Hip Exercises: Supine   Bridges with Ball Squeeze Both;15 reps   Straight Leg Raise with External Rotation Right;15 reps;Strengthening   Straight Leg Raise with External Rotation Limitations 3#     Modalities   Modalities Iontophoresis     Iontophoresis   Type of Iontophoresis Dexamethasone   Location R lateral knee   Dose 1.0 mL, 78mA-min   Time 4-6 hr patch (#1 of 6)     Manual Therapy   Joint Mobilization Patellar mobs inferior/superior and medial glides                 PT Education - 11/13/16 1450    Education provided Yes   Education Details Ionto patch wearing instructions, including precautions & contraindications  Person(s) Educated Patient   Methods Explanation;Handout   Comprehension Verbalized understanding          PT Short Term Goals - 10/23/16 1104      PT SHORT TERM GOAL #1   Title refer to [place] - 10/28/16 1513      PT LONG TERM GOAL #1   Title Independent with ongoing HEP by 11/21/16   Status On-going     PT LONG TERM GOAL #2   Title Pt will improve R patellar mobility to allow for R knee AROM to 0-120 w/o increased pain by 11/21/16   Status On-going     PT LONG TERM GOAL #3   Title Pt will perform sit to stand transfer with minimal to no UE assist w/o increased R knee pain by 11/21/16   Status On-going     PT LONG TERM GOAL #4   Status On-going               Plan - 11/13/16 1416    Clinical Impression Statement R patellar mobility remains very restricted but  improving after patellar mobs. Pt noting increased sensitivty/pain with pressure to lateral patella, therefore ionto patch applied to lateral knee at end of visit. Exercise tolerance remains limited by fatigue, in part due to limited compliance with HEP.   Clinical Impairments Affecting Rehab Potential RA & OA with extensive DJD of cervical spine, shoulders, hips and knees - s/p B THA & L TSA; HTN   PT Treatment/Interventions Patient/family education;ADLs/Self Care Home Management;Manual techniques;Passive range of motion;Dry needling;Taping;Therapeutic exercise;Neuromuscular re-education;Therapeutic activities;Functional mobility training;Gait training;Stair training;Balance training;Electrical Stimulation;Moist Heat;Iontophoresis 4mg /ml Dexamethasone;Ultrasound   PT Next Visit Plan assess response to ionto patch; R knee patellar mobility; R LE flexbility; hip/knee strengthening; modalities PRN for pain   Consulted and Agree with Plan of Care Patient      Patient will benefit from skilled therapeutic intervention in order to improve the following deficits and impairments:  Pain, Decreased scar mobility, Hypomobility, Impaired flexibility, Decreased range of motion, Decreased strength, Decreased mobility, Decreased activity tolerance, Difficulty walking  Visit Diagnosis: Acute pain of right knee  Stiffness of right knee, not elsewhere classified  Other abnormalities of gait and mobility  Muscle weakness (generalized)  Difficulty in walking, not elsewhere classified     Problem List Patient Active Problem List   Diagnosis Date Noted  . OA (osteoarthritis) of knee 05/26/2016  . AKI (acute kidney injury) ([place]) 06/21/2014  . Dehydration 06/21/2014  . Syncope and collapse 06/21/2014  . Hypokalemia 06/21/2014  . Palpitation 06/21/2014  . HTN (hypertension) 05/07/2012  . Arthritis, rheumatoid ([place]) 05/07/2012    Percival Spanish, PT, MPT 11/13/2016, 4:39 PM  [facility] [address]  [place], [place], [address] Phone: [phone]   Fax:  [phone]  Name: Mallory Murphy MRN: [id number] Date of Birth: 1945-08-31

## 2016-11-17 ENCOUNTER — Ambulatory Visit: Payer: Medicare Other

## 2016-11-17 DIAGNOSIS — R2689 Other abnormalities of gait and mobility: Secondary | ICD-10-CM

## 2016-11-17 DIAGNOSIS — M25661 Stiffness of right knee, not elsewhere classified: Secondary | ICD-10-CM

## 2016-11-17 DIAGNOSIS — M25561 Pain in right knee: Secondary | ICD-10-CM | POA: Diagnosis not present

## 2016-11-17 DIAGNOSIS — M6281 Muscle weakness (generalized): Secondary | ICD-10-CM

## 2016-11-17 NOTE — Therapy (Signed)
[facility] [address]  [place], [place], [address] Phone: [phone]   Fax:  [phone]  Physical Therapy Treatment  Patient Details  Name: Mallory Murphy MRN: [id number] Date of Birth: 01-08-1946 Referring Provider: Judith Part. [place], [place]  Encounter Date: 11/17/2016      PT End of Session - 11/17/16 1417    Visit Number 6   Number of Visits 8   Date for PT Re-Evaluation 11/21/16   Authorization Type UHC Medicare   PT Start Time 3474   PT Stop Time 1442   PT Time Calculation (min) 44 min   Activity Tolerance Patient tolerated treatment well   Behavior During Therapy WFL for tasks assessed/performed      Past Medical History:  Diagnosis Date  . Anemia    hx blood tranfusions  . Arthritis   . Diverticulosis   . DJD (degenerative joint disease) of cervical spine   . Hypertension     Past Surgical History:  Procedure Laterality Date  . ELBOW ARTHROPLASTY Right    X3   . JOINT REPLACEMENT     both hips  . 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 TOTAL SHOULDER ARTHROPLASTY Left     There were no vitals filed for this visit.      Subjective Assessment - 11/17/16 1401    Subjective Pt. doing well today however still with knee pain with st<>stand.  Noting knee pain greatly reduced over last few days with ionto patch.     Patient Stated Goals "get up and down w/o knee pain"   Currently in Pain? No/denies   Pain Score 0-No pain   Multiple Pain Sites No                         [facility] Adult PT Treatment/Exercise - 11/17/16 1416      Knee/Hip Exercises: Stretches   Passive Hamstring Stretch Right;30 seconds;1 rep   Passive Hamstring Stretch Limitations supine with strap   Quad Stretch Right;30 seconds;1 rep   Sports administrator Limitations RF - mod  thomas with strap     Knee/Hip Exercises: Aerobic   Recumbent Bike L2 x 6'     Knee/Hip Exercises: Seated   Long Arc Quad Right;15 reps;Weights;Strengthening   Long Arc Quad Weight 3 lbs.   Long Arc Quad Limitations + adduction ball squeeze    Sit to [employer] 10 reps;with UE support  1 UE pushoff      Knee/Hip Exercises: Supine   Bridges with [employer] Both;15 reps  5" hold    Straight Leg Raise with External Rotation Right;Strengthening;20 reps   Straight Leg Raise with External Rotation Limitations 3#   Other Supine Knee/Hip Exercises Straight leg bridge with heels on peanut p-ball 3" x 20 reps     Iontophoresis   Type of Iontophoresis Dexamethasone   Location R lateral knee   Dose 1.0 mL, 61mA-min   Time 4-6 hr patch (#2 of 6)     Manual Therapy   Manual Therapy Soft tissue mobilization   Joint Mobilization Patellar mobs inferior/superior and medial glides   Pt. noting less tenderness at lateral patella    Soft tissue mobilization strumming to R ITB, vastus lateralis   Passive ROM Gentle R ITB stretch with therapist in sidelying with light overpressure  x 1 min                   PT Short Term Goals - 10/23/16 1104      PT SHORT TERM GOAL #1   Title refer to [place] - 10/28/16 1513      PT LONG TERM GOAL #1   Title Independent with ongoing HEP by 11/21/16   Status On-going     PT LONG TERM GOAL #2   Title Pt will improve R patellar mobility to allow for R knee AROM to 0-120 w/o increased pain by 11/21/16   Status On-going     PT LONG TERM GOAL #3   Title Pt will perform sit to stand transfer with minimal to no UE assist w/o increased R knee pain by 11/21/16   Status On-going     PT LONG TERM GOAL #4   Status On-going               Plan - 11/17/16 1425    Clinical Impression Statement Pt. doing well today noting good relief of lateral knee pain since application of ionto patch last treatment.  Still having R knee  pain with sit<>stand transition.  Mallory Murphy tolerated mild progression in LE strengthening therex well with focus on VMO strengthening activities.  R proximal hip stretching with STM/strumming to R ITB.  Pain free with patellar mobs and improved movement noted following stretching/STM.  Some mild R lateral knee pain to end treatment thus into patch #2/6 applied to this area to end treatment.  Pt. verbalizing that she feels knee pain is "much less" than before starting therapy.     PT Treatment/Interventions Patient/family education;ADLs/Self Care Home Management;Manual techniques;Passive range of motion;Dry needling;Taping;Therapeutic exercise;Neuromuscular re-education;Therapeutic activities;Functional mobility training;Gait training;Stair training;Balance training;Electrical Stimulation;Moist Heat;Iontophoresis 4mg /ml Dexamethasone;Ultrasound   PT Next Visit Plan assess response to ionto patch 2#/6; R knee patellar mobility; R LE flexbility; hip/knee strengthening; modalities PRN for pain      Patient will benefit from skilled therapeutic intervention in order to improve the following deficits and impairments:  Pain, Decreased scar mobility, Hypomobility, Impaired flexibility, Decreased range of motion, Decreased strength, Decreased mobility, Decreased activity tolerance, Difficulty walking  Visit Diagnosis: Acute pain of right knee  Stiffness of right knee, not elsewhere classified  Other abnormalities of gait and mobility  Muscle weakness (generalized)     Problem List Patient Active Problem List   Diagnosis Date Noted  . OA (osteoarthritis) of knee 05/26/2016  . AKI (acute kidney injury) ([place]) 06/21/2014  . Dehydration 06/21/2014  . Syncope and collapse 06/21/2014  . Hypokalemia 06/21/2014  . Palpitation 06/21/2014  . HTN (hypertension) 05/07/2012  . Arthritis, rheumatoid ([place]) 05/07/2012    Bess Harvest, PTA 11/17/16 6:41 PM  [facility] [address]  [place], [place], [address] Phone: [phone]   Fax:  [phone]  Name: Mallory Murphy MRN: [id number] Date of Birth: [birth year]

## 2016-11-20 ENCOUNTER — Ambulatory Visit: Payer: Medicare Other | Admitting: Physical Therapy

## 2016-11-20 DIAGNOSIS — M25661 Stiffness of right knee, not elsewhere classified: Secondary | ICD-10-CM

## 2016-11-20 DIAGNOSIS — M25561 Pain in right knee: Secondary | ICD-10-CM | POA: Diagnosis not present

## 2016-11-20 DIAGNOSIS — M6281 Muscle weakness (generalized): Secondary | ICD-10-CM

## 2016-11-20 DIAGNOSIS — R2689 Other abnormalities of gait and mobility: Secondary | ICD-10-CM

## 2016-11-20 NOTE — Therapy (Signed)
[facility] [address]  [place], [place], [address] Phone: [phone]   Fax:  [phone]  Physical Therapy Treatment  Patient Details  Name: Mallory Murphy MRN: [id number] Date of Birth: 05/26/1945 Referring Provider: Judith Part. [place], [place]  Encounter Date: 11/20/2016      PT End of Session - 11/20/16 1401    Visit Number 7   Number of Visits 8   Date for PT Re-Evaluation 11/21/16   Authorization Type UHC Medicare   PT Start Time 1401   PT Stop Time 1514   PT Time Calculation (min) 73 min   Activity Tolerance Patient tolerated treatment well   Behavior During Therapy WFL for tasks assessed/performed      Past Medical History:  Diagnosis Date  . Anemia    hx blood tranfusions  . Arthritis   . Diverticulosis   . DJD (degenerative joint disease) of cervical spine   . Hypertension     Past Surgical History:  Procedure Laterality Date  . ELBOW ARTHROPLASTY Right    X3   . JOINT REPLACEMENT     both hips  . 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 TOTAL SHOULDER ARTHROPLASTY Left     There were no vitals filed for this visit.      Subjective Assessment - 11/20/16 1403    Subjective "Everything is about the same." Notes 1-2 day lessening of pain after ionto patch.   Pertinent History R TKR 05/26/16   Patient Stated Goals "get up and down w/o knee pain"   Currently in Pain? Yes   Pain Score 2   primarly during transitions   Pain Location Knee   Pain Orientation Right   Pain Descriptors / Indicators Aching   Pain Type Acute pain   Pain Onset More than a month ago   Pain Frequency Intermittent   Aggravating Factors  sit <> stand transitions   Pain Relieving Factors tramadol, ionto patch            [facility] PT Assessment - 11/20/16 1401      Assessment    Next MD Visit 12/03/16     AROM   Right Knee Extension 2   Right Knee Flexion 115                     [facility] Adult PT Treatment/Exercise - 11/20/16 1401      Knee/Hip Exercises: Aerobic   Recumbent Bike L2 x 5'     Knee/Hip Exercises: Standing   Functional Squat 15 reps;3 seconds     Modalities   Modalities Electrical Stimulation;Moist Heat     Moist Heat Therapy   Number Minutes Moist Heat 15 Minutes   Moist Heat Location Knee  & anterior thigh     Electrical Stimulation   Electrical Stimulation Location R anterior thigh & knee   Electrical Stimulation Action IFC   Electrical Stimulation Parameters 80-150 Hz, intensity to pt tol x15'   Electrical Stimulation Goals Pain;Tone     Manual Therapy   Manual Therapy Joint mobilization;Soft tissue mobilization;Muscle Energy Technique   Joint Mobilization Patellar mobs inferior/superior and medial glides    Soft tissue mobilization STM & strumming to R lateral quads & ITB   Muscle Energy Technique Contract/relax for R knee flexion & extension  Trigger Point Dry Needling - 11/20/16 1401    Consent Given? Yes   Education Handout Provided Yes   Muscles Treated Lower Body Quadriceps  VM, VI & VL   Quadriceps Response Twitch response elicited;Palpable increased muscle length              PT Education - 11/20/16 1428    Education provided Yes   Education Details Role of DN, DN precautions & contraindications, expected response to DN & post-treatment reccommendations   Person(s) Educated Patient   Methods Explanation;Demonstration;Handout   Comprehension Verbalized understanding          PT Short Term Goals - 10/23/16 1104      PT SHORT TERM GOAL #1   Title refer to [place] - 10/28/16 1513      PT LONG TERM GOAL #1   Title Independent with ongoing HEP by 11/21/16   Status On-going     PT LONG TERM GOAL #2   Title Pt will improve R patellar mobility to allow  for R knee AROM to 0-120 w/o increased pain by 11/21/16   Status On-going     PT LONG TERM GOAL #3   Title Pt will perform sit to stand transfer with minimal to no UE assist w/o increased R knee pain by 11/21/16   Status On-going     PT LONG TERM GOAL #4   Status On-going               Plan - 11/20/16 1504    Clinical Impression Statement Pt reporting minimal change with PT thus far, with indecisive reports of benefit from ionto patches. ROM unchange from eval and pt continuing to favor R LE during transfers d/t continued reports of pain. Pt continues to demonstrate very minimal R patellar mobility with persistant tightness in lateral quads and ITB despite stretching and rolling muscles. Taut bands noted in R quads, esp VI/RF and VL, therefore initiated trial of DN after pt education provided and pt consent received. Twitch response elicited with decreased tension noted following treatment and pt noting less pain during sit to stand transition. Will assess long term benefit at next visit to determine need for recert vs discharge.   PT Treatment/Interventions Patient/family education;ADLs/Self Care Home Management;Manual techniques;Passive range of motion;Dry needling;Taping;Therapeutic exercise;Neuromuscular re-education;Therapeutic activities;Functional mobility training;Gait training;Stair training;Balance training;Electrical Stimulation;Moist Heat;Iontophoresis 4mg /ml Dexamethasone;Ultrasound   PT Next Visit Plan Recert vs D/C; assess response to DN; R knee patellar mobility; R LE flexbility; hip/knee strengthening; modalities PRN for pain      Patient will benefit from skilled therapeutic intervention in order to improve the following deficits and impairments:  Pain, Decreased scar mobility, Hypomobility, Impaired flexibility, Decreased range of motion, Decreased strength, Decreased mobility, Decreased activity tolerance, Difficulty walking  Visit Diagnosis: Acute pain of right  knee  Stiffness of right knee, not elsewhere classified  Other abnormalities of gait and mobility  Muscle weakness (generalized)     Problem List Patient Active Problem List   Diagnosis Date Noted  . OA (osteoarthritis) of knee 05/26/2016  . AKI (acute kidney injury) ([place]) 06/21/2014  . Dehydration 06/21/2014  . Syncope and collapse 06/21/2014  . Hypokalemia 06/21/2014  . Palpitation 06/21/2014  . HTN (hypertension) 05/07/2012  . Arthritis, rheumatoid ([place]) 05/07/2012    Mechele Claude Nolon Rod 11/20/2016, 3:26 PM  [facility] [address]  [place], [place], [address]  Phone: [phone]   Fax:  [phone]  Name: Mallory Murphy MRN: [id number] Date of Birth: 02/28/1946

## 2016-11-20 NOTE — Patient Instructions (Signed)

## 2016-11-27 ENCOUNTER — Ambulatory Visit: Payer: Medicare Other | Admitting: Physical Therapy

## 2016-11-27 DIAGNOSIS — M25561 Pain in right knee: Secondary | ICD-10-CM | POA: Diagnosis not present

## 2016-11-27 DIAGNOSIS — R2689 Other abnormalities of gait and mobility: Secondary | ICD-10-CM

## 2016-11-27 DIAGNOSIS — M25661 Stiffness of right knee, not elsewhere classified: Secondary | ICD-10-CM

## 2016-11-27 DIAGNOSIS — M6281 Muscle weakness (generalized): Secondary | ICD-10-CM

## 2016-11-27 NOTE — Therapy (Signed)
[facility] [address]  [place], [place], [address] Phone: [phone]   Fax:  [phone]  Physical Therapy Treatment  Patient Details  Name: Mallory Murphy MRN: [id number] Date of Birth: 1946-01-11 Referring Provider: Judith Part. [place], [place]  Encounter Date: 11/27/2016      PT End of Session - 11/27/16 1704    Visit Number 8   Number of Visits 14   Date for PT Re-Evaluation 12/25/16   Authorization Type UHC Medicare   PT Start Time 0370   PT Stop Time 1800   PT Time Calculation (min) 56 min   Activity Tolerance Patient tolerated treatment well   Behavior During Therapy WFL for tasks assessed/performed      Past Medical History:  Diagnosis Date  . Anemia    hx blood tranfusions  . Arthritis   . Diverticulosis   . DJD (degenerative joint disease) of cervical spine   . Hypertension     Past Surgical History:  Procedure Laterality Date  . ELBOW ARTHROPLASTY Right    X3   . JOINT REPLACEMENT     both hips  . 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 TOTAL SHOULDER ARTHROPLASTY Left     There were no vitals filed for this visit.      Subjective Assessment - 11/27/16 1712    Subjective Pt noticing a difference in ability to get up and down after the DN, but states pain about the same.   Pertinent History R TKR 05/26/16   Patient Stated Goals "get up and down w/o knee pain"   Currently in Pain? Yes   Pain Score 2   during sit<>stand transitions   Pain Location Knee   Pain Orientation Right   Pain Descriptors / Indicators Aching   Pain Type Acute pain   Pain Onset More than a month ago   Pain Frequency Intermittent   Aggravating Factors  sit <> stand transitions   Pain Relieving Factors DN   Effect of Pain on Daily Activities difficulty with transfers             [facility] PT Assessment - 11/27/16 1704      Assessment   Medical Diagnosis R patellar clunk syndrome following TKA   Referring Provider Judith Part. [place], PA-C   Onset Date/Surgical Date 05/26/16  R TKA   Next MD Visit 12/23/16     AROM   Right Knee Extension 2   Right Knee Flexion 119     Strength   Right Hip Flexion 4/5   Right Hip Extension 4-/5   Right Hip ABduction 4/5   Right Hip ADduction 4/5   Left Hip Flexion 4+/5   Left Hip Extension 4-/5   Left Hip ABduction 4+/5   Left Hip ADduction 4/5   Right Knee Flexion 4+/5   Right Knee Extension 4/5   Left Knee Flexion 4+/5   Left Knee Extension 4+/5                     [facility] Adult PT Treatment/Exercise - 11/27/16 1704      Knee/Hip Exercises: Stretches   Quad Stretch Right;30 seconds;2 reps   Quad Stretch Limitations manual RF stretch in mod thomas position after DN   ITB Stretch Right;30 seconds;2 reps   ITB Stretch Limitations manual after DN  Knee/Hip Exercises: Aerobic   Recumbent Bike L2 x 5'     Modalities   Modalities Electrical Stimulation;Moist Heat     Moist Heat Therapy   Number Minutes Moist Heat 10 Minutes   Moist Heat Location Knee  & anterior thigh     Electrical Stimulation   Electrical Stimulation Location R anterior thigh & knee   Electrical Stimulation Action IFC   Electrical Stimulation Parameters 80-150 Hz, intensity to pt tol x10'   Electrical Stimulation Goals Pain;Tone     Manual Therapy   Manual Therapy Joint mobilization;Soft tissue mobilization;Muscle Energy Technique   Joint Mobilization Patellar mobs inferior/superior and medial glides    Soft tissue mobilization STM & strumming to R lateral quads & ITB   Muscle Energy Technique Contract/relax for R knee flexion & extension          Trigger Point Dry Needling - 11/27/16 1704    Consent Given? Yes   Muscles Treated Lower Body Quadriceps  RF, VI, VL   Quadriceps Response Twitch response  elicited;Palpable increased muscle length                PT Short Term Goals - 10/23/16 1104      PT SHORT TERM GOAL #1   Title refer to [place] - 11/27/16 1715      PT LONG TERM GOAL #1   Title Independent with ongoing HEP    Status Partially Met  pt reporting better compliance with HEP than during intiial rehab after TKA, but admits she is still not as good as she should be with her HEP   Target Date 12/25/16     PT LONG TERM GOAL #2   Title Pt will improve R patellar mobility to allow for R knee AROM to 0-120 w/o increased pain    Status On-going  slightly improving patellar mobility, with flexion ROM increased to 119 dg (extension unchanged)   Target Date 12/25/16     PT LONG TERM GOAL #3   Title Pt will perform sit to stand transfer with minimal to no UE assist w/o increased R knee pain   Status On-going  pt reporting less pain with sit to stnad transistion, but still relying on UE assist               Plan - 11/27/16 [place] reporting benefit from DN with less pain and increased ease of transition sit to stand following DN last visit, although pain level unchanged. Slight improvment noted in R patellar mobility as a result of decreased muscle tension, with improved flexion ROM to 119 dg. Given benefit from initial trial of DN with improving ROM and mobility, will extend POC for additional 6 visits until pt returns to MD. POC to continue focus on stretching and manual therapy with DN as indicated to reduce muscle tension and increase flexibility, along with furter strengthening and functional mobility training to improve transfers with decreased pain interference.   Rehab Potential Good   Clinical Impairments Affecting Rehab Potential RA & OA with extensive DJD of cervical spine, shoulders, hips and knees - s/p B THA & L TSA; HTN   PT Treatment/Interventions Patient/family education;ADLs/Self  Care Home Management;Manual techniques;Passive range of motion;Dry needling;Taping;Therapeutic exercise;Neuromuscular re-education;Therapeutic activities;Functional mobility training;Gait training;Stair training;Balance training;Electrical Stimulation;Moist Heat;Iontophoresis 79m/ml Dexamethasone;Ultrasound   PT Next Visit Plan manual therapy to quads & ITB; R knee patellar  mobility; R LE flexbility; hip/knee strengthening; modalities PRN for pain; DN as continued benefit noted   Consulted and Agree with Plan of Care Patient      Patient will benefit from skilled therapeutic intervention in order to improve the following deficits and impairments:  Pain, Decreased scar mobility, Hypomobility, Impaired flexibility, Decreased range of motion, Decreased strength, Decreased mobility, Decreased activity tolerance, Difficulty walking  Visit Diagnosis: Acute pain of right knee  Stiffness of right knee, not elsewhere classified  Other abnormalities of gait and mobility  Muscle weakness (generalized)     Problem List Patient Active Problem List   Diagnosis Date Noted  . OA (osteoarthritis) of knee 05/26/2016  . AKI (acute kidney injury) ([place]) 06/21/2014  . Dehydration 06/21/2014  . Syncope and collapse 06/21/2014  . Hypokalemia 06/21/2014  . Palpitation 06/21/2014  . HTN (hypertension) 05/07/2012  . Arthritis, rheumatoid ([place]) 05/07/2012    Percival Spanish, PT, MPT 11/27/2016, 6:32 PM  [facility] [address]  [place], [place], [address] Phone: [phone]   Fax:  [phone]  Name: Mallory Murphy MRN: [id number] Date of Birth: 11-Mar-1946

## 2016-12-01 ENCOUNTER — Ambulatory Visit: Payer: Medicare Other | Admitting: Physical Therapy

## 2016-12-03 ENCOUNTER — Ambulatory Visit: Payer: Medicare Other | Admitting: Physical Therapy

## 2016-12-16 ENCOUNTER — Ambulatory Visit: Payer: Medicare Other | Attending: Physician Assistant | Admitting: Physical Therapy

## 2016-12-16 DIAGNOSIS — M25661 Stiffness of right knee, not elsewhere classified: Secondary | ICD-10-CM | POA: Diagnosis present

## 2016-12-16 DIAGNOSIS — M6281 Muscle weakness (generalized): Secondary | ICD-10-CM

## 2016-12-16 DIAGNOSIS — R2689 Other abnormalities of gait and mobility: Secondary | ICD-10-CM

## 2016-12-16 DIAGNOSIS — M25561 Pain in right knee: Secondary | ICD-10-CM | POA: Diagnosis present

## 2016-12-16 DIAGNOSIS — R262 Difficulty in walking, not elsewhere classified: Secondary | ICD-10-CM | POA: Insufficient documentation

## 2016-12-16 NOTE — Therapy (Signed)
[facility] [address]  [place], [place], [address] Phone: [phone]   Fax:  [phone]  Physical Therapy Treatment  Patient Details  Name: Mallory Murphy MRN: [id number] Date of Birth: [birth year] Referring Provider: Judith Part. [place], [place]  Encounter Date: 12/16/2016      PT End of Session - 12/16/16 1319    Visit Number 9   Number of Visits 14   Date for PT Re-Evaluation 12/25/16   Authorization Type UHC Medicare   PT Start Time 4166   PT Stop Time 1359   PT Time Calculation (min) 42 min   Activity Tolerance Patient tolerated treatment well   Behavior During Therapy WFL for tasks assessed/performed      Past Medical History:  Diagnosis Date  . Anemia    hx blood tranfusions  . Arthritis   . Diverticulosis   . DJD (degenerative joint disease) of cervical spine   . Hypertension     Past Surgical History:  Procedure Laterality Date  . ELBOW ARTHROPLASTY Right    X3   . JOINT REPLACEMENT     both hips  . 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 TOTAL SHOULDER ARTHROPLASTY Left     There were no vitals filed for this visit.      Subjective Assessment - 12/16/16 1318    Subjective patient reports still having some "crunching" of R knee   Pertinent History R TKR 05/26/16   Patient Stated Goals "get up and down w/o knee pain"   Currently in Pain? No/denies   Pain Score 2   only with sit to/from stand transfers   Pain Location Knee   Pain Orientation Right   Pain Descriptors / Indicators --  "crunching"                         [facility] Adult PT Treatment/Exercise - 12/16/16 1321      Knee/Hip Exercises: Aerobic   Recumbent Bike L3 x 5 minutes     Knee/Hip Exercises: Seated   Sit to Sand 10 reps;without UE support  L foot on step to  maximize R quad work     Modalities   Modalities Ultrasound     Ultrasound   Ultrasound Location R lateral knee   Ultrasound Parameters 50% duty cycle, 1.2 w/cm2, 1 mHz   Ultrasound Goals Pain     Manual Therapy   Manual Therapy Joint mobilization;Soft tissue mobilization   Joint Mobilization Patellar mobs inferior/superior and medial glides    Soft tissue mobilization STM to R lateral quads, R patellar tendon          Trigger Point Dry Needling - 12/16/16 1424    Consent Given? Yes   Muscles Treated Lower Body Quadriceps  VL   Quadriceps Response Twitch response elicited;Palpable increased muscle length                PT Short Term Goals - 10/23/16 1104      PT SHORT TERM GOAL #1   Title refer to [place] - 11/27/16 1715      PT LONG TERM GOAL #1   Title Independent with ongoing HEP    Status Partially Met  pt reporting better compliance with  HEP than during intiial rehab after TKA, but admits she is still not as good as she should be with her HEP   Target Date 12/25/16     PT LONG TERM GOAL #2   Title Pt will improve R patellar mobility to allow for R knee AROM to 0-120 w/o increased pain    Status On-going  slightly improving patellar mobility, with flexion ROM increased to 119 dg (extension unchanged)   Target Date 12/25/16     PT LONG TERM GOAL #3   Title Pt will perform sit to stand transfer with minimal to no UE assist w/o increased R knee pain   Status On-going  pt reporting less pain with sit to stnad transistion, but still relying on UE assist               Plan - 12/16/16 1320    Clinical Impression Statement Patient reports good benefit from DN at previous session and would like to continue with this treatment. Good twitch response with DN treatment today. Ultrasound incorporated due to some reports of tendon type pain at R lateral knee. Will continue to progress towards goals with focus on functional  mobility activities to allow for carryover into the home and community environment.    Clinical Impairments Affecting Rehab Potential RA & OA with extensive DJD of cervical spine, shoulders, hips and knees - s/p B THA & L TSA; HTN   PT Treatment/Interventions Patient/family education;ADLs/Self Care Home Management;Manual techniques;Passive range of motion;Dry needling;Taping;Therapeutic exercise;Neuromuscular re-education;Therapeutic activities;Functional mobility training;Gait training;Stair training;Balance training;Electrical Stimulation;Moist Heat;Iontophoresis 52m/ml Dexamethasone;Ultrasound   PT Next Visit Plan manual therapy to quads & ITB; R knee patellar mobility; R LE flexbility; hip/knee strengthening; modalities PRN for pain; DN as continued benefit noted   Consulted and Agree with Plan of Care Patient      Patient will benefit from skilled therapeutic intervention in order to improve the following deficits and impairments:  Pain, Decreased scar mobility, Hypomobility, Impaired flexibility, Decreased range of motion, Decreased strength, Decreased mobility, Decreased activity tolerance, Difficulty walking  Visit Diagnosis: Acute pain of right knee  Stiffness of right knee, not elsewhere classified  Other abnormalities of gait and mobility  Muscle weakness (generalized)     Problem List Patient Active Problem List   Diagnosis Date Noted  . OA (osteoarthritis) of knee 05/26/2016  . [place] injury) ([place] 06/21/2014  . Dehydration 06/21/2014  . Syncope and collapse 06/21/2014  . Hypokalemia 06/21/2014  . Palpitation 06/21/2014  . HTN (hypertension) 05/07/2012  . Arthritis, rheumatoid ([place] 05/07/2012    SLanney Gins PT, DPT 12/16/16 2:44 PM   [facility] [place] [address]Phone: [phone]  Fax:  [phone] Name: Mallory SHIPPMRN: [id number] of  Birth: 106-Oct-1947

## 2016-12-18 ENCOUNTER — Ambulatory Visit: Payer: Medicare Other

## 2016-12-18 DIAGNOSIS — R2689 Other abnormalities of gait and mobility: Secondary | ICD-10-CM

## 2016-12-18 DIAGNOSIS — M25661 Stiffness of right knee, not elsewhere classified: Secondary | ICD-10-CM

## 2016-12-18 DIAGNOSIS — M25561 Pain in right knee: Secondary | ICD-10-CM

## 2016-12-18 DIAGNOSIS — M6281 Muscle weakness (generalized): Secondary | ICD-10-CM

## 2016-12-18 NOTE — Therapy (Signed)
[facility] [address]  [place], [place], [address] Phone: [phone]   Fax:  [phone]  Physical Therapy Treatment  Patient Details  Name: Mallory Murphy MRN: [id number] Date of Birth: 05-Jun-1945 Referring Provider: Judith Part. [place], [place]  Encounter Date: 12/18/2016      PT End of Session - 12/18/16 1329    Visit Number 10   Number of Visits 14   Date for PT Re-Evaluation 12/25/16   Authorization Type UHC Medicare   PT Start Time 1325  pt. arrived late    PT Stop Time 1404   PT Time Calculation (min) 39 min   Activity Tolerance Patient tolerated treatment well   Behavior During Therapy WFL for tasks assessed/performed      Past Medical History:  Diagnosis Date  . Anemia    hx blood tranfusions  . Arthritis   . Diverticulosis   . DJD (degenerative joint disease) of cervical spine   . Hypertension     Past Surgical History:  Procedure Laterality Date  . ELBOW ARTHROPLASTY Right    X3   . JOINT REPLACEMENT     both hips  . 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 TOTAL SHOULDER ARTHROPLASTY Left     There were no vitals filed for this visit.      Subjective Assessment - 12/18/16 1327    Subjective Pt. noting she has felt improvement in pain levels and "less crunching" at R knee when rising from sitting.     Patient Stated Goals "get up and down w/o knee pain"   Currently in Pain? Yes   Pain Score 2    Pain Location Knee   Pain Orientation Right   Pain Descriptors / Indicators Aching   Pain Type Acute pain   Pain Onset More than a month ago   Pain Frequency Intermittent   Aggravating Factors  Sit <> stand transitions    Pain Relieving Factors DN    Effect of Pain on Daily Activities Difficulty with transfers   Multiple Pain Sites No             [facility] PT Assessment - 12/18/16 1339      AROM   Right/Left Knee Right   Right Knee Extension 2   Right Knee Flexion 120                     [facility] Adult PT Treatment/Exercise - 12/18/16 1346      Knee/Hip Exercises: Aerobic   Recumbent Bike L3 x 6 minutes     Knee/Hip Exercises: Standing   Forward Step Up Step Height: 6";Right;10 reps;Hand Hold: 1     Knee/Hip Exercises: Seated   Long Arc Quad Right;15 reps;Weights;Strengthening  3" hold    Long Arc Quad Weight 3 lbs.   Long Arc Quad Limitations + adduction ball squeeze    Sit to [employer] 10 reps;without UE support;3 sets  last set with blue foam oval under L LE     Knee/Hip Exercises: Supine   Straight Leg Raise with External Rotation Right;Strengthening;20 reps  3" hold    Straight Leg Raise with External Rotation Limitations 2#                   PT Short Term Goals - 10/23/16 1104  PT SHORT TERM GOAL #1   Title refer to [place]           PT Long Term Goals - 2017/01/01 1335      PT LONG TERM GOAL #1   Title Independent with ongoing HEP    Status Partially Met  admitting to very limited compliance with HEP      PT LONG TERM GOAL #2   Title Pt will improve R patellar mobility to allow for R knee AROM to 0-120 w/o increased pain    Status Partially Met  Flexion AROM increased to 120 dg; extension unchanged still at 2dg from full      PT LONG TERM GOAL #3   Title Pt will perform sit to stand transfer with minimal to no UE assist w/o increased R knee pain   Status Partially Met  Reporting less pain (2/10) with sit<>stand transfer and able to perform without UE pushoff.               Plan - Jan 01, 2017 1331    Clinical Impression Statement Pt. noting she has felt "some" improvement in R knee "crunching" sensation with sit<>stand since starting therapy.  Still having some pain with sit<>stand transfer today however able to perform without UE assistance.  Partially meeting goals today  demonstrating 120 dg AROM knee flexion however still at 2 dg extension.  Pt. continues to admit to poor adherence to HEP stating, "I try not to do anything that exerts me".  Wishes to wait untill after MD f/u to schedule more appointments with therapy.     PT Treatment/Interventions Patient/family education;ADLs/Self Care Home Management;Manual techniques;Passive range of motion;Dry needling;Taping;Therapeutic exercise;Neuromuscular re-education;Therapeutic activities;Functional mobility training;Gait training;Stair training;Balance training;Electrical Stimulation;Moist Heat;Iontophoresis 43m/ml Dexamethasone;Ultrasound   PT Next Visit Plan manual therapy to quads & ITB; R knee patellar mobility; R LE flexbility; hip/knee strengthening; modalities PRN for pain; DN as continued benefit noted      Patient will benefit from skilled therapeutic intervention in order to improve the following deficits and impairments:  Pain, Decreased scar mobility, Hypomobility, Impaired flexibility, Decreased range of motion, Decreased strength, Decreased mobility, Decreased activity tolerance, Difficulty walking  Visit Diagnosis: Acute pain of right knee  Stiffness of right knee, not elsewhere classified  Other abnormalities of gait and mobility  Muscle weakness (generalized)       G-Codes - 009/20/20181552    Functional Assessment Tool Used (Outpatient Only) Knee FOTO = 37% (63% limitation)   Functional Limitation Mobility: Walking and moving around   Mobility: Walking and Moving Around Current Status ((I7867 At least 60 percent but less than 80 percent impaired, limited or restricted   Mobility: Walking and Moving Around Goal Status ((E7209 At least 40 percent but less than 60 percent impaired, limited or restricted      Problem List Patient Active Problem List   Diagnosis Date Noted  . OA (osteoarthritis) of knee 05/26/2016  . AKI (acute kidney injury) ([place] 06/21/2014  . Dehydration 06/21/2014  .  Syncope and collapse 06/21/2014  . Hypokalemia 06/21/2014  . Palpitation 06/21/2014  . HTN (hypertension) 05/07/2012  . Arthritis, rheumatoid ([place] 05/07/2012    MBess Harvest PTA 009/20/187:19 PM  [facility] [address] [place] [address]Phone: [phone]  Fax:  [phone] Name: BEMMALOU HUNGERMRN: [id number] of Birth: [birth year]  Patient has probably reached maximum potential with PT due to inconsistent attendance with PT, limited  effort during therapy sessions, and poor compliance with HEP.  Percival Spanish, PT, MPT 12/18/16, 7:19 PM  [facility] [address]  [place], [place], [address] Phone: [phone]   Fax:  [phone]

## 2016-12-29 ENCOUNTER — Ambulatory Visit: Payer: Medicare Other | Admitting: Physical Therapy

## 2016-12-29 DIAGNOSIS — M25561 Pain in right knee: Secondary | ICD-10-CM | POA: Diagnosis not present

## 2016-12-29 DIAGNOSIS — M25661 Stiffness of right knee, not elsewhere classified: Secondary | ICD-10-CM

## 2016-12-29 DIAGNOSIS — R2689 Other abnormalities of gait and mobility: Secondary | ICD-10-CM

## 2016-12-29 DIAGNOSIS — R262 Difficulty in walking, not elsewhere classified: Secondary | ICD-10-CM

## 2016-12-29 DIAGNOSIS — M6281 Muscle weakness (generalized): Secondary | ICD-10-CM

## 2016-12-29 NOTE — Therapy (Signed)
[facility] [address]  [place], [place], [address] Phone: [phone]   Fax:  [phone]  Physical Therapy Treatment  Patient Details  Name: Mallory Murphy MRN: [id number] Date of Birth: Dec 20, 1945 Referring Provider: Judith Part. [place], [place]  Encounter Date: 12/29/2016      PT End of Session - 12/29/16 1446    Visit Number 11   Number of Visits 14   Date for PT Re-Evaluation --   Authorization Type [employer] Medicare   PT Start Time 0160   PT Stop Time 1455   PT Time Calculation (min) 9 min   Activity Tolerance Patient tolerated treatment well   Behavior During Therapy WFL for tasks assessed/performed      Past Medical History:  Diagnosis Date  . Anemia    hx blood tranfusions  . Arthritis   . Diverticulosis   . DJD (degenerative joint disease) of cervical spine   . Hypertension     Past Surgical History:  Procedure Laterality Date  . ELBOW ARTHROPLASTY Right    X3   . JOINT REPLACEMENT     both hips  . 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 TOTAL SHOULDER ARTHROPLASTY Left     There were no vitals filed for this visit.      Subjective Assessment - 12/29/16 1451    Subjective Pt stating "nothing's changed", but reporting she feels like the dry needling has helped. Pt stating MD wants her to continue with PT for the visits remaining in her POC.   Pertinent History R TKR 05/26/16   Patient Stated Goals "get up and down w/o knee pain"   Currently in Pain? Yes   Pain Score 2    Pain Location Knee   Pain Orientation Right   Pain Descriptors / Indicators Sharp   Pain Type Acute pain;Chronic pain   Pain Onset More than a month ago   Pain Frequency Intermittent   Aggravating Factors  sit <> stand transitions   Pain Relieving Factors DN   Effect of Pain on Daily  Activities difficulty with transfers            [facility] PT Assessment - 12/29/16 1446      Assessment   Medical Diagnosis R patellar clunk syndrome following TKA   Referring Provider Judith Part. [place], PA-C   Onset Date/Surgical Date 05/26/16  R TKA   Next MD Visit 6 months                     [facility] Adult PT Treatment/Exercise - 12/29/16 1446      Knee/Hip Exercises: Aerobic   Recumbent Bike L3 x 6'                PT Education - 12/29/16 1457    Education provided Yes   Education Details Need for demonstrated progress with PT, consistency of attendance and regular compliance with HEP to justify skilled PT need   Person(s) Educated Patient   Methods Explanation   Comprehension Verbalized understanding          PT Short Term Goals - 10/23/16 1104      PT SHORT TERM GOAL #1   Title refer to [place] - 12/29/16 1455  PT LONG TERM GOAL #1   Title Independent with ongoing HEP    Status Partially Met  admitting to very limited compliance with HEP     PT LONG TERM GOAL #2   Title Pt will improve R patellar mobility to allow for R knee AROM to 0-120 w/o increased pain    Status Partially Met  as of 12/18/16: R knee AROM 2-120     PT LONG TERM GOAL #3   Title Pt will perform sit to stand transfer with minimal to no UE assist w/o increased R knee pain   Status Partially Met  As of 12/18/16: reporting 2/10 pain with sit<>stand transfer and able to perform without UE pushoff.               Plan - 01/12/17 1454    Clinical Impression Statement Ayona returning to PT after 11 day absence during which time she had a f/u with MD on 12/23/16. Pt stating MD wants her to continue with visits remaining in her POC (no orders received to this effect). Pt has 4 approved visits remaining from last recert but POC dates expired as of 12/25/16. Dates expired w/o completing visits due to pt selective attendance. Pt has typically  put forth minimal effort during therapy sessions, preferring passive modalities, and openly admits to limited compliance with HEP. Given pt's inconsistency with attendance, repeated admitted poor compliance with HEP and her comments stating "nothing's changed" with PT, educated pt on need to demonstrate progress with PT to justify need for further skilled PT even with MD instructions to continue, which includes regular attendance to PT sessions and good compliance with HEP. At this point, pt left therapy without completing today's visit and cancelled all further appointments, stating "I don't need to be chastised".    Clinical Impairments Affecting Rehab Potential RA & OA with extensive DJD of cervical spine, shoulders, hips and knees - s/p B THA & L TSA; HTN   PT Treatment/Interventions Patient/family education;ADLs/Self Care Home Management;Manual techniques;Passive range of motion;Dry needling;Taping;Therapeutic exercise;Neuromuscular re-education;Therapeutic activities;Functional mobility training;Gait training;Stair training;Balance training;Electrical Stimulation;Moist Heat;Iontophoresis 59m/ml Dexamethasone;Ultrasound   PT Next Visit Plan discharge per pt request   Consulted and Agree with Plan of Care Patient      Patient will benefit from skilled therapeutic intervention in order to improve the following deficits and impairments:  Pain, Decreased scar mobility, Hypomobility, Impaired flexibility, Decreased range of motion, Decreased strength, Decreased mobility, Decreased activity tolerance, Difficulty walking  Visit Diagnosis: Acute pain of right knee  Stiffness of right knee, not elsewhere classified  Other abnormalities of gait and mobility  Muscle weakness (generalized)  Difficulty in walking, not elsewhere classified       G-Codes - [id number]    Functional Assessment Tool Used (Outpatient Only) Knee FOTO = 37% (63% limitation)   Functional Limitation Mobility: Walking and  moving around   Mobility: Walking and Moving Around Goal Status [phone] At least 40 percent but less than 60 percent impaired, limited or restricted   Mobility: Walking and Moving Around Discharge Status [phone] At least 60 percent but less than 80 percent impaired, limited or restricted      Problem List Patient Active Problem List   Diagnosis Date Noted  . OA (osteoarthritis) of knee 05/26/2016  . AKI (acute kidney injury) ([place] [place] 06/21/2014  . Dehydration 06/21/2014  . Syncope and collapse 06/21/2014  . Hypokalemia 06/21/2014  . Palpitation 06/21/2014  . HTN (hypertension) 05/07/2012  . Arthritis, rheumatoid ([facility] 05/07/2012  PHYSICAL THERAPY DISCHARGE SUMMARY  Visits from Start of Care: 11  Current functional level related to goals / functional outcomes:   Refer to above clinical impression. Unable to complete formal discharge assessment as pt left w/o completing therapy session, and cancelled all further appointments.   Remaining deficits:   As above.   Education / Equipment:   HEP  Plan: Patient agrees to discharge.  Patient goals were partially met. Patient is being discharged due to the patient's request.  ?????       Percival Spanish, PT, MPT 12/29/2016, 3:46 PM  [facility] [address]  [place], [place], [address] Phone: [phone]   Fax:  [phone]  Name: LAKECHIA NAY MRN: [id number] Date of Birth: April 09, 1946

## 2018-03-25 ENCOUNTER — Other Ambulatory Visit: Payer: Self-pay | Admitting: Obstetrics and Gynecology

## 2018-03-25 DIAGNOSIS — Z1231 Encounter for screening mammogram for malignant neoplasm of breast: Secondary | ICD-10-CM

## 2018-04-30 ENCOUNTER — Ambulatory Visit
Admission: RE | Admit: 2018-04-30 | Discharge: 2018-04-30 | Disposition: A | Discharge status: Home / Self Care | Payer: Medicare Other | Source: Ambulatory Visit | Attending: Obstetrics and Gynecology | Admitting: Obstetrics and Gynecology

## 2018-04-30 DIAGNOSIS — Z1231 Encounter for screening mammogram for malignant neoplasm of breast: Secondary | ICD-10-CM

## 2018-05-21 ENCOUNTER — Other Ambulatory Visit: Payer: Self-pay | Admitting: Internal Medicine

## 2018-05-21 ENCOUNTER — Other Ambulatory Visit: Payer: Self-pay | Admitting: Family Medicine

## 2018-05-21 DIAGNOSIS — I739 Peripheral vascular disease, unspecified: Secondary | ICD-10-CM

## 2018-05-21 DIAGNOSIS — R1310 Dysphagia, unspecified: Secondary | ICD-10-CM

## 2018-05-26 ENCOUNTER — Ambulatory Visit
Admission: RE | Admit: 2018-05-26 | Discharge: 2018-05-26 | Disposition: A | Discharge status: Home / Self Care | Payer: Medicare Other | Source: Ambulatory Visit | Attending: Internal Medicine | Admitting: Internal Medicine

## 2018-05-26 ENCOUNTER — Other Ambulatory Visit: Payer: Medicare Other

## 2018-05-26 DIAGNOSIS — I739 Peripheral vascular disease, unspecified: Secondary | ICD-10-CM

## 2018-05-31 ENCOUNTER — Ambulatory Visit
Admission: RE | Admit: 2018-05-31 | Discharge: 2018-05-31 | Disposition: A | Discharge status: Home / Self Care | Payer: Medicare Other | Source: Ambulatory Visit | Attending: Internal Medicine | Admitting: Internal Medicine

## 2018-05-31 DIAGNOSIS — R1310 Dysphagia, unspecified: Secondary | ICD-10-CM

## 2018-06-19 IMAGING — CT CT ANGIO CHEST
2 of 9 series · 19 of 36 positions shown · IV contrast (isovue)
Comparison: None.

CLINICAL DATA: Weakness since knee surgery May 26, 2016.

EXAM:
CT ANGIOGRAPHY CHEST WITH CONTRAST
TECHNIQUE: Multidetector CT imaging of the chest was performed using the
standard protocol during bolus administration of intravenous
contrast. Multiplanar CT image reconstructions and MIPs were
obtained to evaluate the vascular anatomy.
CONTRAST:  100 mL Isovue 370

[Series 7: pe thins · axial · 0.56mm/px · z∈[-304,-23]mm · 18 of 315 slices shown]
[im 17/315  lung]
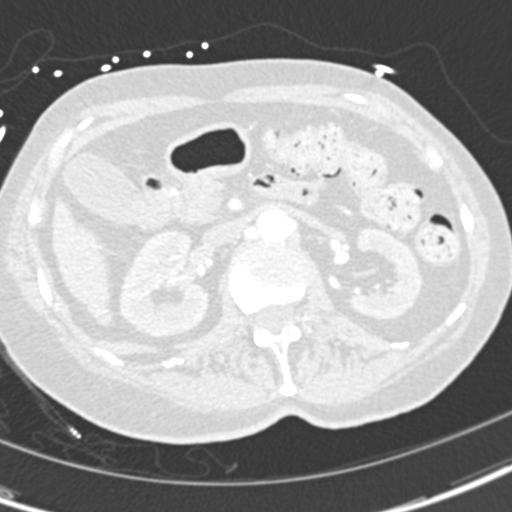
[im 34/315  mediastinal]
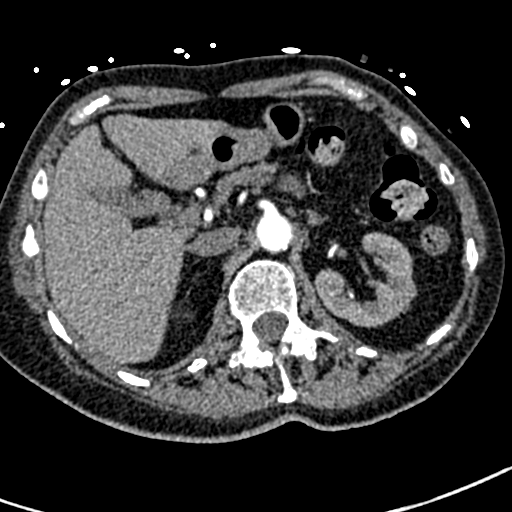
[im 50/315  lung]
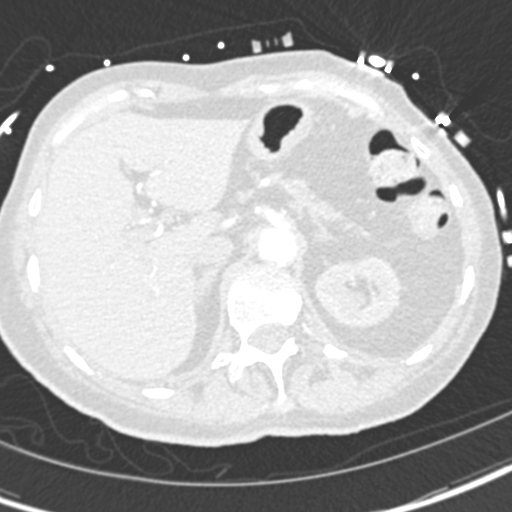
[im 67/315  mediastinal]
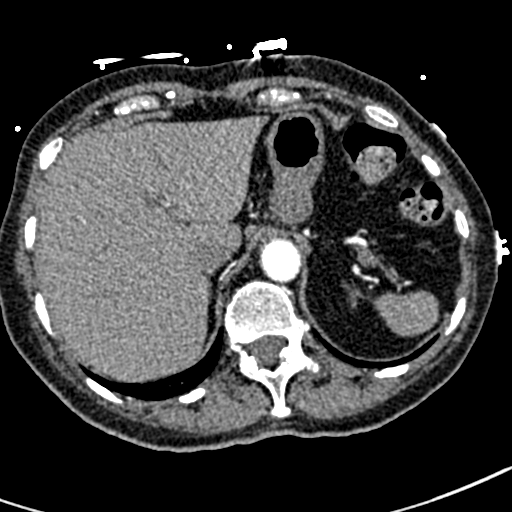
[im 83/315  lung]
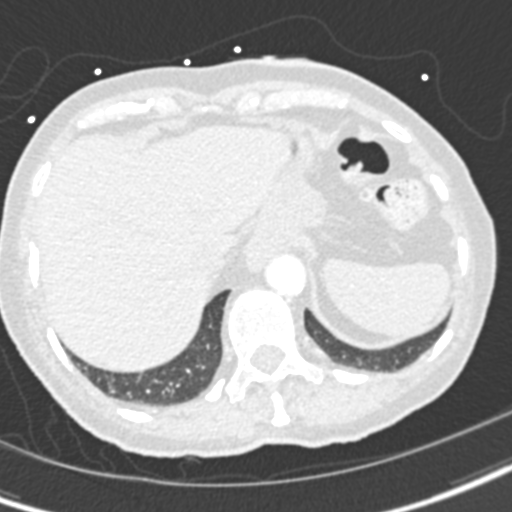
[im 100/315  mediastinal]
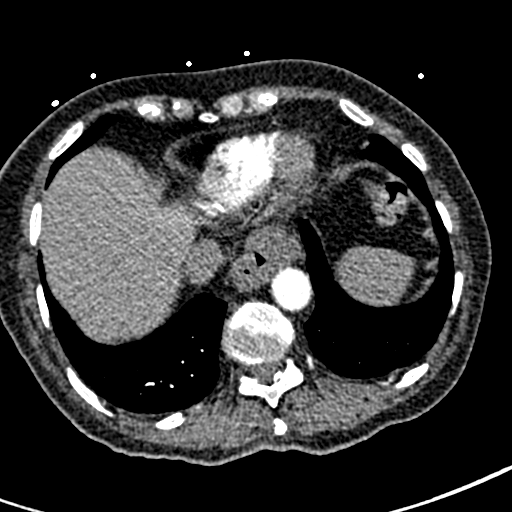
[im 116/315  lung]
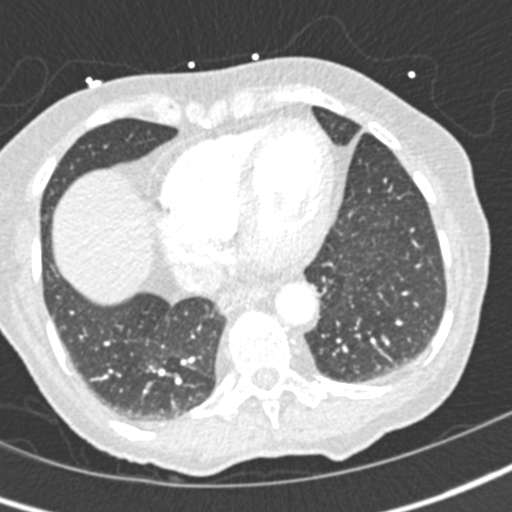
[im 133/315  mediastinal]
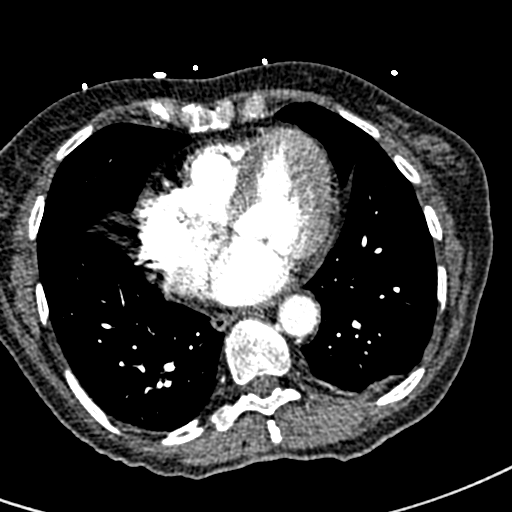
[im 149/315  lung]
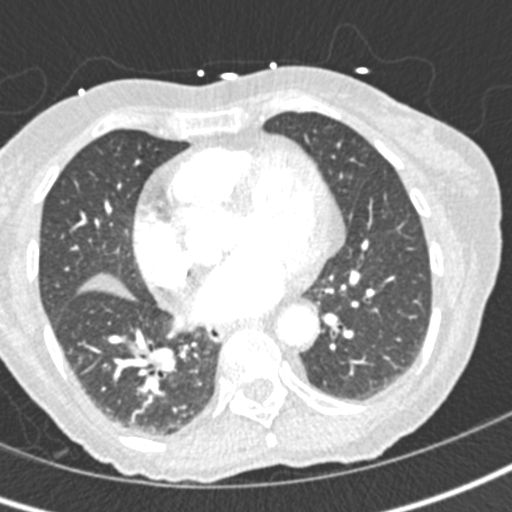
[im 166/315  mediastinal]
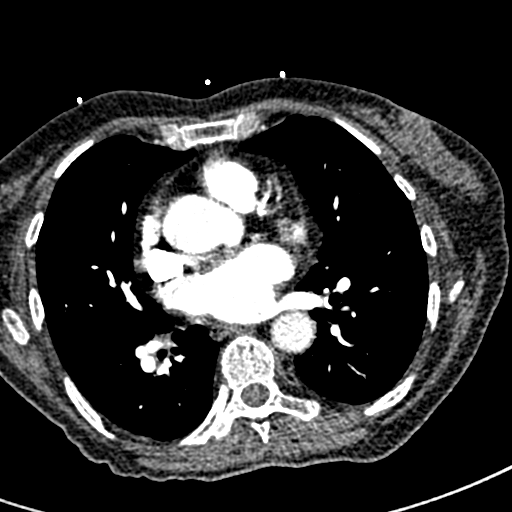
[im 182/315  lung]
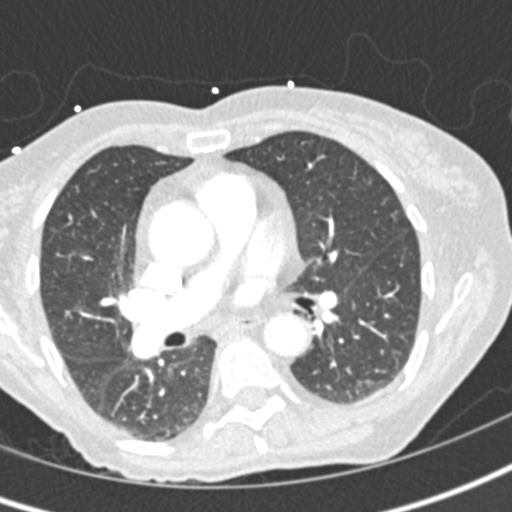
[im 199/315  mediastinal]
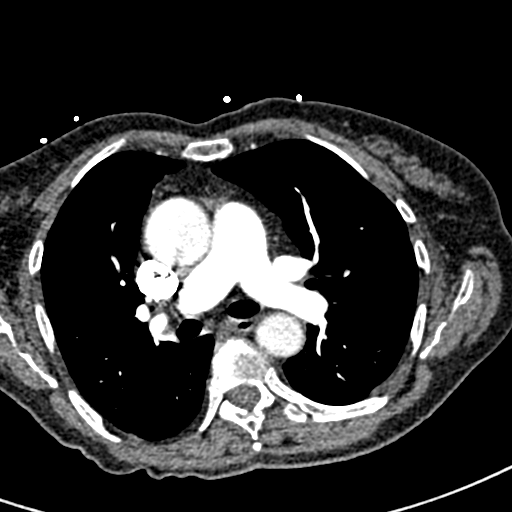
[im 215/315  lung]
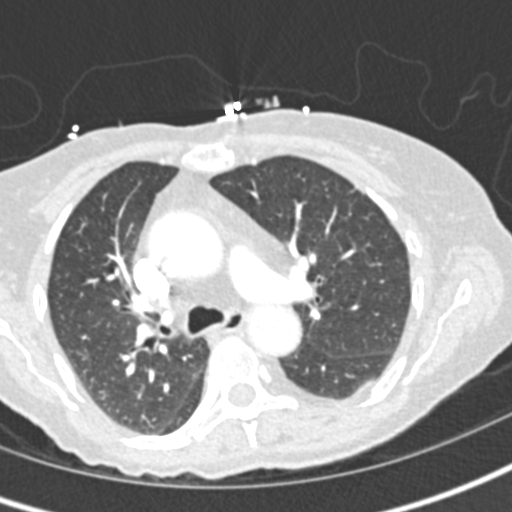
[im 232/315  mediastinal]
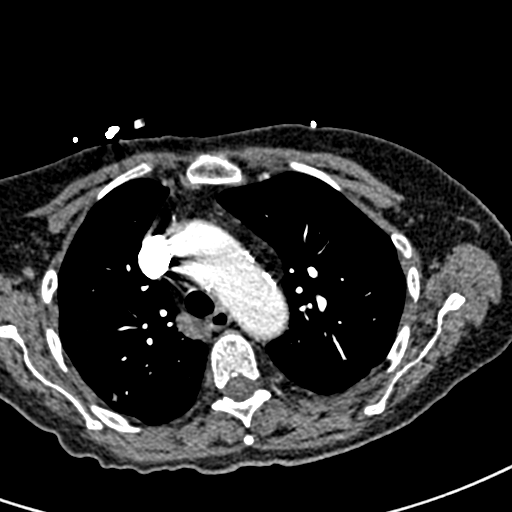
[im 248/315  lung]
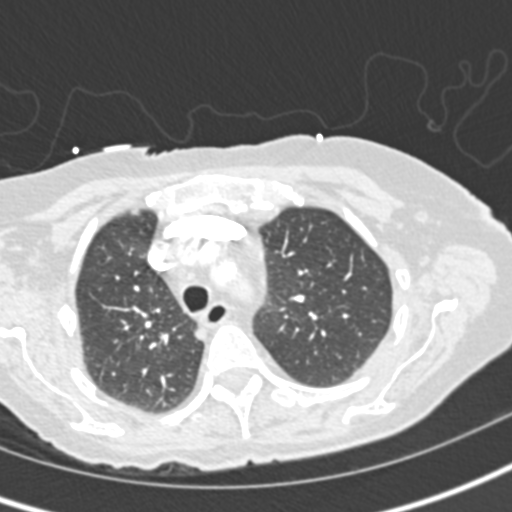
[im 265/315  mediastinal]
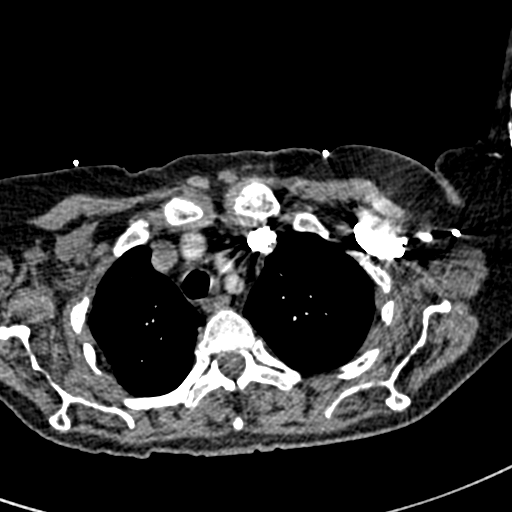
[im 281/315  lung]
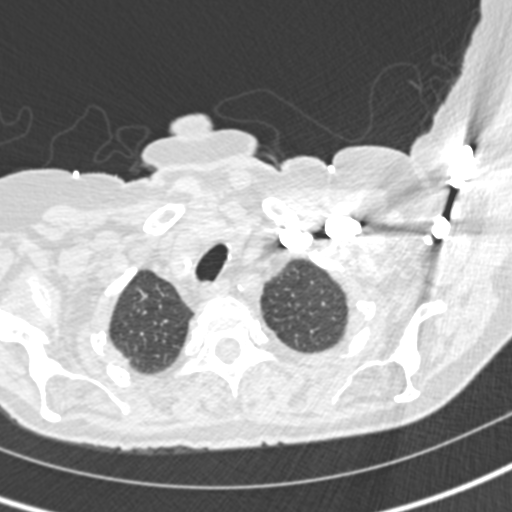
[im 298/315  mediastinal]
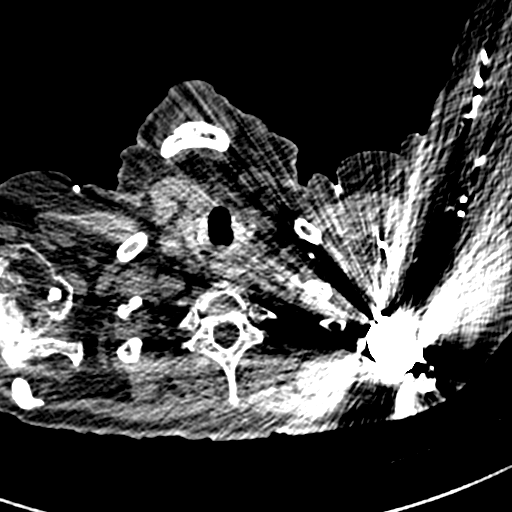

[Series 8: pe coronal mpr · coronal · 0.62mm/px · 1 of 123 slices shown]
[im 62/123  mediastinal]
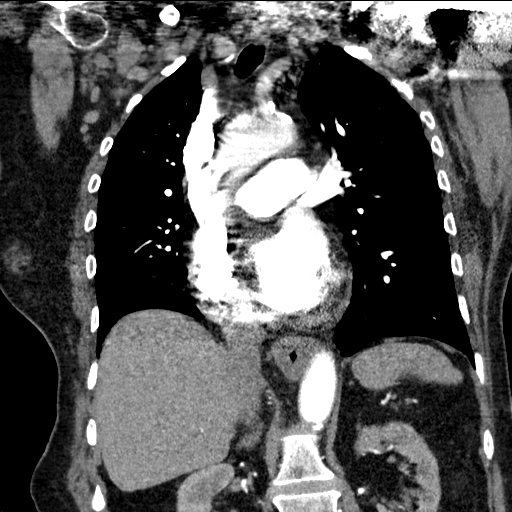

[19 of 36 positions shown; findings below may reference images not displayed]

FINDINGS: Cardiovascular: Satisfactory opacification of the pulmonary arteries
to the segmental level. No evidence of pulmonary embolism. Normal
heart size. No pericardial effusion. Normal caliber thoracic aorta.
Thoracic aortic atherosclerosis. Coronary artery atherosclerosis in
the LAD.

Mediastinum/Nodes: No enlarged mediastinal, hilar, or axillary lymph
nodes. Thyroid gland, trachea, and esophagus demonstrate no
significant findings.

Lungs/Pleura: Lungs are clear. No pleural effusion or pneumothorax.

Upper Abdomen: No acute upper abdominal abnormality. Moderate size
hiatal hernia.

Musculoskeletal: No acute osseous abnormality. Total reverse left
shoulder arthroplasty.

Review of the MIP images confirms the above findings.
IMPRESSION: 1. No evidence pulmonary embolus.
2. No acute cardiopulmonary disease.
3. Coronary artery atherosclerosis.
4.  Aortic Atherosclerosis ([id number]-170.0)

## 2018-11-18 ENCOUNTER — Inpatient Hospital Stay: Payer: Medicare Other | Attending: Hematology & Oncology | Admitting: Hematology & Oncology

## 2018-11-18 ENCOUNTER — Other Ambulatory Visit: Payer: Self-pay

## 2018-11-18 ENCOUNTER — Inpatient Hospital Stay: Payer: Medicare Other

## 2018-11-18 ENCOUNTER — Encounter: Payer: Self-pay | Admitting: Hematology & Oncology

## 2018-11-18 VITALS — BP 115/46 | HR 106 | Temp 97.6°F | Resp 20 | Wt 132.4 lb

## 2018-11-18 DIAGNOSIS — D649 Anemia, unspecified: Secondary | ICD-10-CM

## 2018-11-18 DIAGNOSIS — M069 Rheumatoid arthritis, unspecified: Secondary | ICD-10-CM | POA: Diagnosis not present

## 2018-11-18 DIAGNOSIS — D509 Iron deficiency anemia, unspecified: Secondary | ICD-10-CM | POA: Insufficient documentation

## 2018-11-18 DIAGNOSIS — D500 Iron deficiency anemia secondary to blood loss (chronic): Secondary | ICD-10-CM

## 2018-11-18 DIAGNOSIS — N179 Acute kidney failure, unspecified: Secondary | ICD-10-CM

## 2018-11-18 LAB — CMP (CANCER CENTER ONLY)
ALT: 6 U/L (ref 0–44)
AST: 13 U/L — ABNORMAL LOW (ref 15–41)
Albumin: 3.5 g/dL (ref 3.5–5.0)
Alkaline Phosphatase: 76 U/L (ref 38–126)
Anion gap: 9 (ref 5–15)
BUN: 11 mg/dL (ref 8–23)
CO2: 27 mmol/L (ref 22–32)
Calcium: 8.4 mg/dL — ABNORMAL LOW (ref 8.9–10.3)
Chloride: 101 mmol/L (ref 98–111)
Creatinine: 0.91 mg/dL (ref 0.44–1.00)
GFR, Est AFR Am: >60 mL/min (ref >60)
GFR, Estimated: >60 mL/min (ref >60)
Glucose, Bld: 102 mg/dL — ABNORMAL HIGH (ref 70–99)
Potassium: 3.6 mmol/L (ref 3.5–5.1)
Sodium: 137 mmol/L (ref 135–145)
Total Bilirubin: 0.2 mg/dL — ABNORMAL LOW (ref 0.3–1.2)
Total Protein: 7.2 g/dL (ref 6.5–8.1)

## 2018-11-18 LAB — CBC WITH DIFFERENTIAL (CANCER CENTER ONLY)
Abs Immature Granulocytes: 0.02 10*3/uL (ref 0.00–0.07)
Basophils Absolute: 0 10*3/uL (ref 0.0–0.1)
Basophils Relative: 0 %
Eosinophils Absolute: 0.4 10*3/uL (ref 0.0–0.5)
Eosinophils Relative: 5 %
HCT: 26.5 % — ABNORMAL LOW (ref 36.0–46.0)
Hemoglobin: 8.6 g/dL — ABNORMAL LOW (ref 12.0–15.0)
Immature Granulocytes: 0 %
Lymphocytes Relative: 29 %
Lymphs Abs: 2.1 10*3/uL (ref 0.7–4.0)
MCH: 31.6 pg (ref 26.0–34.0)
MCHC: 32.5 g/dL (ref 30.0–36.0)
MCV: 97.4 fL (ref 80.0–100.0)
Monocytes Absolute: 0.7 10*3/uL (ref 0.1–1.0)
Monocytes Relative: 10 %
Neutro Abs: 4 10*3/uL (ref 1.7–7.7)
Neutrophils Relative %: 56 %
Platelet Count: 448 10*3/uL — ABNORMAL HIGH (ref 150–400)
RBC: 2.72 MIL/uL — ABNORMAL LOW (ref 3.87–5.11)
RDW: 14.2 % (ref 11.5–15.5)
WBC Count: 7.2 10*3/uL (ref 4.0–10.5)
nRBC: 0 % (ref 0.0–0.2)

## 2018-11-18 LAB — SAVE SMEAR(SSMR), FOR PROVIDER SLIDE REVIEW

## 2018-11-18 LAB — RETICULOCYTES
Immature Retic Fract: 11.1 % (ref 2.3–15.9)
RBC.: 2.72 MIL/uL — ABNORMAL LOW (ref 3.87–5.11)
Retic Count, Absolute: 20.9 10*3/uL (ref 19.0–186.0)
Retic Ct Pct: 0.8 % (ref 0.4–3.1)

## 2018-11-18 NOTE — Progress Notes (Signed)
Referral MD  Reason for Referral: Normochromic normocytic anemia  Chief Complaint  Patient presents with  . New Patient (Initial Visit)    "my hemoglobin in low."  : Her blood has been low for a long time.  HPI: Mallory Murphy is a very nice 73 year old white female.  She has a longstanding history of rheumatoid arthritis.  She is on treatment for this.  She gets an infusion every couple months.  She has been anemic for quite a while as far she knows.  Says that she is never had a transfusion before.  She just is been given oral iron.  She has had no bleeding.  She had a last colonoscopy at 73 years old everything looked okay.  She gets her mammograms in January.  There is no family history of blood problems.  She has not lost or gained weight.  She is not a vegetarian.  She has really not rheumatoid arthritis.  She has had surgery for her hips, right elbow, left knee.  In going through her records, back in 2016, her CBC showed a white cell count of 10.3.  Hemoglobin 10.4.  Other count 327,000.  Her MCV was 91.  In 2018, her white cell count was 5.4.  Her hemoglobin is 8.8.  Platelet count 272,000.  MCV is 86.  March 2018, her white cell count was 7.2.  Hemoglobin 9.1.  Platelet count 443,000.  MCV was 87.  She has no problems with kidney failure.  She is not diabetic.  She was kindly referred to the [facility] for an evaluation.  She does not smoke.  She does not drink.  She has no obvious occupational exposures.  There is no change in bowel or bladder habits.  There is no melena or bright red blood per rectum.  She has had no rashes.  There is been no ecchymoses.  Overall, her performance status is ECOG 1-2.    Past Medical History:  Diagnosis Date  . Anemia    hx blood tranfusions  . Arthritis   . Diverticulosis   . DJD (degenerative joint disease) of cervical spine   . Hypertension   :  Past Surgical History:  Procedure Laterality Date  . ELBOW  ARTHROPLASTY Right    X3   . JOINT REPLACEMENT     both hips  . 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 TOTAL SHOULDER ARTHROPLASTY Left   :   Current Outpatient Medications:  .  escitalopram (LEXAPRO) 10 MG tablet, Take 10 mg by mouth daily., Disp: , Rfl:  .  losartan (COZAAR) 100 MG tablet, 100 mg daily., Disp: , Rfl:  .  oxybutynin (DITROPAN-XL) 5 MG 24 hr tablet, Take 5 mg by mouth at bedtime., Disp: , Rfl:  .  traMADol (ULTRAM) 50 MG tablet, Take 1-2 tablets (50-100 mg total) by mouth every 6 (six) hours as needed for moderate pain., Disp: 56 tablet, Rfl: 0 .  iron polysaccharides (NIFEREX) 150 MG capsule, Take 1 capsule (150 mg total) by mouth 2 (two) times daily. (Patient not taking: Reported on 11/18/2018), Disp: 42 capsule, Rfl: 0 .  methocarbamol (ROBAXIN) 500 MG tablet, Take 1 tablet (500 mg total) by mouth every 6 (six) hours as needed for muscle spasms., Disp: 80 tablet, Rfl: 0 .  metoprolol tartrate (LOPRESSOR) 25 MG tablet, Take 1 tablet (25 mg total)  by mouth 2 (two) times daily., Disp: 60 tablet, Rfl: 0 .  oxyCODONE (OXY IR/ROXICODONE) 5 MG immediate release tablet, Take 1-2 tablets (5-10 mg total) by mouth every 4 (four) hours as needed for moderate pain or severe pain. (Patient not taking: Reported on 10/23/2016), Disp: 84 tablet, Rfl: 0 .  rivaroxaban (XARELTO) 10 MG TABS tablet, Take 1 tablet (10 mg total) by mouth daily with breakfast. Take Xarelto for two and a half more weeks following discharge from the hospital, then discontinue Xarelto. Once the patient has completed the blood thinner regimen, then take a Baby 81 mg Aspirin daily for three more weeks. (Patient not taking: Reported on 10/23/2016), Disp: 19 tablet, Rfl: 0:  :  Allergies  Allergen Reactions  . Macrobid [Nitrofurantoin Macrocrystal] Nausea Only  :  History reviewed.  No pertinent family history.:  Social History   Socioeconomic History  . Marital status: Divorced    Spouse name: Not on file  . Number of children: Not on file  . Years of education: Not on file  . Highest education level: Not on file  Occupational History  . Not on file  Social Needs  . Financial resource strain: Not on file  . Food insecurity    Worry: Not on file    Inability: Not on file  . Transportation needs    Medical: Not on file    Non-medical: Not on file  Tobacco Use  . Smoking status: Never Smoker  . Smokeless tobacco: Never Used  Substance and Sexual Activity  . Alcohol use: Yes    Comment: socially  . Drug use: No  . Sexual activity: Not on file  Lifestyle  . Physical activity    Days per week: Not on file    Minutes per session: Not on file  . Stress: Not on file  Relationships  . Social Herbalist on phone: Not on file    Gets together: Not on file    Attends religious service: Not on file    Active member of club or organization: Not on file    Attends meetings of clubs or organizations: Not on file    Relationship status: Not on file  . Intimate partner violence    Fear of current or ex partner: Not on file    Emotionally abused: Not on file    Physically abused: Not on file    Forced sexual activity: Not on file  Other Topics Concern  . Not on file  Social History Narrative  . Not on file  :  Review of Systems  Constitutional: Negative.   HENT: Negative.   Eyes: Negative.   Respiratory: Negative.   Cardiovascular: Negative.   Gastrointestinal: Negative.   Genitourinary: Negative.   Musculoskeletal: Positive for joint pain.  Skin: Negative.   Neurological: Negative.   Endo/Heme/Allergies: Negative.   Psychiatric/Behavioral: Negative.      Exam: Well-developed well-nourished white female with obvious arthritic deformities.  Vital signs are temperature of 97.6.  Pulse 106.  Blood pressure 115/46.  Weight is 132 pounds.   Her head neck exam shows no ocular or oral lesions.  Her conjunctive are slightly pale.  She has no oral lesions.  There is no adenopathy in her neck.  Her thyroid is nonpalpable.  Her lungs are clear bilaterally.  Cardiac exam regular rate and rhythm with no murmurs, rubs or bruits.  Axillary exam shows no axillary adenopathy bilaterally.  Abdomen is soft.  She has good bowel  sounds.  There is no fluid wave.  There is no palpable liver or spleen tip.  Back exam shows no tenderness over the spine, ribs or hips.  Extremities shows obvious arthritic deformities from her arthritis.  Skin exam shows no rashes, ecchymoses or petechia.  Neurological exam is nonfocal.    _0 @   Recent Labs    11/18/18 1408  WBC 7.2  HGB 8.6*  HCT 26.5*  PLT 448*   Recent Labs    11/18/18 1408  NA 137  K 3.6  CL 101  CO2 27  GLUCOSE 102*  BUN 11  CREATININE 0.91  CALCIUM 8.4*    Blood smear review: Mild anisocytosis and poikilocytosis.  There is no nucleated red blood cells.  I see no teardrop cells.  There is no rouleaux formation.  There may be some slight microcytic red blood cells.  White blood cells show no hypersegmented polys.  She has good maturation of her white blood cells.  He has no immature myeloid or lymphoid cells.  Platelets are adequate in number and size.  Pathology: None    Assessment and Plan: [name] is a very charming 72 year old white female.  She has anemia.  She has normochromic anemia.  This anemia has been going on for quite a while.  It certainly could be anemia of chronic disease from her rheumatoid arthritis.  I think the important part of her evaluation is the fact that her reticulocyte count when corrected is less than 0.5.  This means that her bone marrow is just not getting the input to make red blood cells.  We will have to see what her iron levels look like.  This might be on the low side.  I would be more interested in her erythropoietin level.  I have to  believe that her erythropoietin level is going to be quite low.  She might need both iron and ESA.  We will have to see what the labs look like.  I do not see anything on her blood smear or on her exam that would suggest a hematologic malignancy.  I do not think she needs a bone marrow biopsy.  I just feel bad that she has his bad arthritis.  I know this is difficult for her.  She is really doing her best to get through the arthritis and the effects.  I did give her a prayer blanket.  She was very thankful for this.  I spent about 45 minutes with her.  I reviewed all of her lab work.  I reviewed her blood smear.  I explained her my thoughts and my recommendations.  Answered all of her questions.  We will likely plan to get her back to see [place] in another month or so.  This will be dependent upon her labs.

## 2018-11-19 ENCOUNTER — Telehealth: Payer: Self-pay | Admitting: *Deleted

## 2018-11-19 ENCOUNTER — Encounter: Payer: Self-pay | Admitting: Hematology & Oncology

## 2018-11-19 DIAGNOSIS — D500 Iron deficiency anemia secondary to blood loss (chronic): Secondary | ICD-10-CM

## 2018-11-19 HISTORY — DX: Iron deficiency anemia secondary to blood loss (chronic): D50.0

## 2018-11-19 LAB — IRON AND TIBC
Iron: 17 ug/dL — ABNORMAL LOW (ref 41–142)
Saturation Ratios: 6 % — ABNORMAL LOW (ref 21–57)
TIBC: 281 ug/dL (ref 236–444)
UIBC: 264 ug/dL (ref 120–384)

## 2018-11-19 LAB — FERRITIN: Ferritin: 39 ng/mL (ref 11–307)

## 2018-11-19 LAB — ERYTHROPOIETIN: Erythropoietin: 27.6 m[IU]/mL — ABNORMAL HIGH (ref 2.6–18.5)

## 2018-11-19 NOTE — Telephone Encounter (Signed)
-----   Message from Mallory Napoleon, MD sent at 11/19/2018 10:41 AM EDT ----- Call - the iron is actually very low!!  We need 2 doses of IV Iron .  Please set up!!  Laurey Arrow

## 2018-11-19 NOTE — Addendum Note (Signed)
Addended by: Burney Gauze R on: 11/19/2018 02:02 PM   Modules accepted: Orders

## 2018-11-19 NOTE — Telephone Encounter (Signed)
Pt notified of lab results, to expect a call regarding the date/time for 2 IV Iron infusions. Pt to expect a call from scheduling.

## 2018-11-24 LAB — IMMUNOFIXATION REFLEX, SERUM
IgA: 360 mg/dL (ref 64–422)
IgG (Immunoglobin G), Serum: 2140 mg/dL — ABNORMAL HIGH (ref 586–1602)
IgM (Immunoglobulin M), Srm: 93 mg/dL (ref 26–217)

## 2018-11-24 LAB — PROTEIN ELECTROPHORESIS, SERUM, WITH REFLEX
A/G Ratio: 0.9 (ref 0.7–1.7)
Albumin ELP: 3.3 g/dL (ref 2.9–4.4)
Alpha-1-Globulin: 0.3 g/dL (ref 0.0–0.4)
Alpha-2-Globulin: 0.8 g/dL (ref 0.4–1.0)
Beta Globulin: 1 g/dL (ref 0.7–1.3)
Gamma Globulin: 1.6 g/dL (ref 0.4–1.8)
Globulin, Total: 3.7 g/dL (ref 2.2–3.9)
M-Spike, %: 1.1 g/dL — ABNORMAL HIGH
SPEP Interpretation: 0
Total Protein ELP: 7 g/dL (ref 6.0–8.5)

## 2018-11-30 ENCOUNTER — Inpatient Hospital Stay: Payer: Medicare Other

## 2018-11-30 ENCOUNTER — Other Ambulatory Visit: Payer: Self-pay

## 2018-11-30 VITALS — BP 203/75 | HR 63 | Resp 17

## 2018-11-30 DIAGNOSIS — D500 Iron deficiency anemia secondary to blood loss (chronic): Secondary | ICD-10-CM

## 2018-11-30 DIAGNOSIS — D509 Iron deficiency anemia, unspecified: Secondary | ICD-10-CM | POA: Diagnosis not present

## 2018-11-30 MED ORDER — SODIUM CHLORIDE 0.9 % IV SOLN
510.0000 mg | Freq: Once | INTRAVENOUS | Status: AC
  Administered 2018-11-30: 510 mg via INTRAVENOUS
  Filled 2018-11-30: qty 17

## 2018-11-30 MED ORDER — SODIUM CHLORIDE 0.9 % IV SOLN
Freq: Once | INTRAVENOUS | Status: AC
  Administered 2018-11-30: 15:00:00 via INTRAVENOUS
  Filled 2018-11-30: qty 250

## 2018-11-30 NOTE — Patient Instructions (Signed)

## 2018-11-30 NOTE — Progress Notes (Signed)
Pt.'s BP was high, pt. Asymptomatic. Was advised to re-check BP at home, and if still high to contact her PCP. Pt. Verbalized understanding.

## 2018-12-07 ENCOUNTER — Inpatient Hospital Stay: Payer: Medicare Other

## 2018-12-17 ENCOUNTER — Other Ambulatory Visit: Payer: Self-pay

## 2018-12-17 ENCOUNTER — Telehealth: Payer: Self-pay | Admitting: Hematology & Oncology

## 2018-12-17 ENCOUNTER — Inpatient Hospital Stay: Payer: Medicare Other | Attending: Hematology & Oncology

## 2018-12-17 VITALS — BP 168/88 | HR 80 | Temp 98.6°F | Resp 14

## 2018-12-17 DIAGNOSIS — D500 Iron deficiency anemia secondary to blood loss (chronic): Secondary | ICD-10-CM

## 2018-12-17 MED ORDER — SODIUM CHLORIDE 0.9 % IV SOLN
510.0000 mg | Freq: Once | INTRAVENOUS | Status: AC
  Administered 2018-12-17: 11:00:00 510 mg via INTRAVENOUS
  Filled 2018-12-17: qty 510

## 2018-12-17 MED ORDER — SODIUM CHLORIDE 0.9 % IV SOLN
Freq: Once | INTRAVENOUS | Status: AC
  Administered 2018-12-17: 10:00:00 via INTRAVENOUS
  Filled 2018-12-17: qty 250

## 2018-12-17 NOTE — Patient Instructions (Signed)

## 2018-12-17 NOTE — Telephone Encounter (Signed)
lmom to inform patient of 10/8 appt at 1 pm per 9/4 sch msg

## 2019-01-19 ENCOUNTER — Other Ambulatory Visit: Payer: Self-pay | Admitting: *Deleted

## 2019-01-19 DIAGNOSIS — D500 Iron deficiency anemia secondary to blood loss (chronic): Secondary | ICD-10-CM

## 2019-01-20 ENCOUNTER — Inpatient Hospital Stay: Payer: Medicare Other | Attending: Hematology & Oncology | Admitting: Hematology & Oncology

## 2019-01-20 ENCOUNTER — Encounter: Payer: Self-pay | Admitting: Hematology & Oncology

## 2019-01-20 ENCOUNTER — Other Ambulatory Visit: Payer: Self-pay

## 2019-01-20 ENCOUNTER — Inpatient Hospital Stay: Payer: Medicare Other

## 2019-01-20 ENCOUNTER — Telehealth: Payer: Self-pay | Admitting: Hematology & Oncology

## 2019-01-20 DIAGNOSIS — N189 Chronic kidney disease, unspecified: Secondary | ICD-10-CM | POA: Diagnosis not present

## 2019-01-20 DIAGNOSIS — N1831 Chronic kidney disease, stage 3a: Secondary | ICD-10-CM | POA: Insufficient documentation

## 2019-01-20 DIAGNOSIS — N183 Chronic kidney disease, stage 3 unspecified: Secondary | ICD-10-CM | POA: Diagnosis not present

## 2019-01-20 DIAGNOSIS — D631 Anemia in chronic kidney disease: Secondary | ICD-10-CM

## 2019-01-20 DIAGNOSIS — E611 Iron deficiency: Secondary | ICD-10-CM | POA: Diagnosis not present

## 2019-01-20 DIAGNOSIS — Z79899 Other long term (current) drug therapy: Secondary | ICD-10-CM | POA: Diagnosis not present

## 2019-01-20 DIAGNOSIS — M069 Rheumatoid arthritis, unspecified: Secondary | ICD-10-CM | POA: Diagnosis not present

## 2019-01-20 DIAGNOSIS — D500 Iron deficiency anemia secondary to blood loss (chronic): Secondary | ICD-10-CM

## 2019-01-20 DIAGNOSIS — D509 Iron deficiency anemia, unspecified: Secondary | ICD-10-CM | POA: Diagnosis not present

## 2019-01-20 HISTORY — DX: Anemia in chronic kidney disease: D63.1

## 2019-01-20 HISTORY — DX: Chronic kidney disease, stage 3 unspecified: N18.30

## 2019-01-20 LAB — CBC WITH DIFFERENTIAL (CANCER CENTER ONLY)
Abs Immature Granulocytes: 0.07 10*3/uL (ref 0.00–0.07)
Basophils Absolute: 0 10*3/uL (ref 0.0–0.1)
Basophils Relative: 0 %
Eosinophils Absolute: 0.3 10*3/uL (ref 0.0–0.5)
Eosinophils Relative: 5 %
HCT: 27.1 % — ABNORMAL LOW (ref 36.0–46.0)
Hemoglobin: 9 g/dL — ABNORMAL LOW (ref 12.0–15.0)
Immature Granulocytes: 1 %
Lymphocytes Relative: 34 %
Lymphs Abs: 2.4 10*3/uL (ref 0.7–4.0)
MCH: 33.7 pg (ref 26.0–34.0)
MCHC: 33.2 g/dL (ref 30.0–36.0)
MCV: 101.5 fL — ABNORMAL HIGH (ref 80.0–100.0)
Monocytes Absolute: 0.6 10*3/uL (ref 0.1–1.0)
Monocytes Relative: 9 %
Neutro Abs: 3.6 10*3/uL (ref 1.7–7.7)
Neutrophils Relative %: 51 %
Platelet Count: 323 10*3/uL (ref 150–400)
RBC: 2.67 MIL/uL — ABNORMAL LOW (ref 3.87–5.11)
RDW: 19.6 % — ABNORMAL HIGH (ref 11.5–15.5)
WBC Count: 7 10*3/uL (ref 4.0–10.5)
nRBC: 0 % (ref 0.0–0.2)

## 2019-01-20 LAB — COMPREHENSIVE METABOLIC PANEL
ALT: 8 U/L (ref 0–44)
AST: 18 U/L (ref 15–41)
Albumin: 3.7 g/dL (ref 3.5–5.0)
Alkaline Phosphatase: 71 U/L (ref 38–126)
Anion gap: 10 (ref 5–15)
BUN: 12 mg/dL (ref 8–23)
CO2: 27 mmol/L (ref 22–32)
Calcium: 9.5 mg/dL (ref 8.9–10.3)
Chloride: 103 mmol/L (ref 98–111)
Creatinine, Ser: 1.18 mg/dL — ABNORMAL HIGH (ref 0.44–1.00)
GFR calc Af Amer: 53 mL/min — ABNORMAL LOW (ref >60)
GFR calc non Af Amer: 46 mL/min — ABNORMAL LOW (ref >60)
Glucose, Bld: 112 mg/dL — ABNORMAL HIGH (ref 70–99)
Potassium: 4 mmol/L (ref 3.5–5.1)
Sodium: 140 mmol/L (ref 135–145)
Total Bilirubin: 0.4 mg/dL (ref 0.3–1.2)
Total Protein: 7.8 g/dL (ref 6.5–8.1)

## 2019-01-20 LAB — RETIC PANEL
Immature Retic Fract: 11.4 % (ref 2.3–15.9)
RBC.: 2.69 MIL/uL — ABNORMAL LOW (ref 3.87–5.11)
Retic Count, Absolute: 32.8 10*3/uL (ref 19.0–186.0)
Retic Ct Pct: 1.2 % (ref 0.4–3.1)
Reticulocyte Hemoglobin: 38.5 pg (ref >27.9)

## 2019-01-20 NOTE — Telephone Encounter (Signed)
Appointments scheduled calendar printed per 10/8 los

## 2019-01-20 NOTE — Progress Notes (Signed)
Hematology and Oncology Follow Up Visit  Mallory Murphy [id number] 1945-12-13 73 y.o. 01/20/2019   Principle Diagnosis:   Anemia of erythropoitin deficeincy  Anemia of Iron deficiency  Rheumatoid Arthritis  Current Therapy:    IV Iron -- Feraheme given on 11/2018  Aranesp 300 mcg sq for Hgb <11      Interim History:  Mallory Murphy is back for her second office visit.  We first saw her back in early August.  At that time, she was quite anemic.  She had clearly iron deficiency.  Her ferritin was 39 with iron saturation of only 6%.  Given that she has rheumatoid arthritis, the ferritin being only 39 is incredibly low.  We gave her 2 doses of IV iron.  Today, her hemoglobin is only 9.  Her erythropoietin level was only 28.  I think this is very low for her.  She still is bothered by the rheumatoid arthritis.  This does not bother her quite a bit.  I am sure that she had a significant inflammatory component to her.  She does have a small M- spike.  This was 1.1 g/dL.  Her IgG level was 2140 milligrams per deciliter.  She has had no problems with bowels or bladder.  She is little bit constipated.  There is no cough.  She has had no nausea or vomiting.  There is no headache.  Overall, I would say her performance status is ECOG 1. Medications:  Current Outpatient Medications:  .  escitalopram (LEXAPRO) 10 MG tablet, Take 10 mg by mouth daily., Disp: , Rfl:  .  losartan (COZAAR) 100 MG tablet, 100 mg daily., Disp: , Rfl:  .  oxybutynin (DITROPAN-XL) 5 MG 24 hr tablet, Take 5 mg by mouth at bedtime., Disp: , Rfl:  .  traMADol (ULTRAM) 50 MG tablet, Take 1-2 tablets (50-100 mg total) by mouth every 6 (six) hours as needed for moderate pain., Disp: 56 tablet, Rfl: 0  Allergies:  Allergies  Allergen Reactions  . Macrobid [Nitrofurantoin Macrocrystal] Nausea Only    Past Medical History, Surgical history, Social history, and Family History were reviewed and updated.  Review of  Systems: Review of Systems  Constitutional: Positive for fatigue.  HENT:  Negative.   Eyes: Negative.   Respiratory: Negative.   Cardiovascular: Negative.   Gastrointestinal: Positive for constipation.  Endocrine: Negative.   Genitourinary: Negative.    Musculoskeletal: Positive for arthralgias, back pain, neck pain and neck stiffness.  Neurological: Negative.   Hematological: Negative.   Psychiatric/Behavioral: Negative.     Physical Exam:  weight is 131 lb 4 oz (59.5 kg). Her tympanic temperature is 98.6 F (37 C). Her blood pressure is 123/55 (abnormal) and her pulse is 101 (abnormal). Her respiration is 16 and oxygen saturation is 99%.   Wt Readings from Last 3 Encounters:  01/20/19 131 lb 4 oz (59.5 kg)  11/18/18 132 lb 6.4 oz (60.1 kg)  06/20/16 129 lb (58.5 kg)    Physical Exam Vitals signs reviewed.  HENT:     Head: Normocephalic and atraumatic.  Eyes:     Pupils: Pupils are equal, round, and reactive to light.  Neck:     Musculoskeletal: Normal range of motion.  Cardiovascular:     Rate and Rhythm: Normal rate and regular rhythm.     Heart sounds: Normal heart sounds.  Pulmonary:     Effort: Pulmonary effort is normal.     Breath sounds: Normal breath sounds.  Abdominal:  General: Bowel sounds are normal.     Palpations: Abdomen is soft.  Musculoskeletal: Normal range of motion.        General: No tenderness or deformity.     Comments: She has obvious changes on rheumatoid arthritis.  She has some joint deformities.  She has some slight weakness in her legs which is chronic.  She has decreased range of motion of her joints.  Lymphadenopathy:     Cervical: No cervical adenopathy.  Skin:    General: Skin is warm and dry.     Findings: No erythema or rash.  Neurological:     Mental Status: She is alert and oriented to person, place, and time.  Psychiatric:        Behavior: Behavior normal.        Thought Content: Thought content normal.         Judgment: Judgment normal.      Lab Results  Component Value Date   WBC 7.0 01/20/2019   HGB 9.0 (L) 01/20/2019   HCT 27.1 (L) 01/20/2019   MCV 101.5 (H) 01/20/2019   PLT 323 01/20/2019     Chemistry      Component Value Date/Time   NA 140 01/20/2019 1316   K 4.0 01/20/2019 1316   CL 103 01/20/2019 1316   CO2 27 01/20/2019 1316   BUN 12 01/20/2019 1316   CREATININE 1.18 (H) 01/20/2019 1316   CREATININE 0.91 11/18/2018 1408   CREATININE 1.19 (H) 05/07/2012 1553      Component Value Date/Time   CALCIUM 9.5 01/20/2019 1316   ALKPHOS 71 01/20/2019 1316   AST 18 01/20/2019 1316   AST 13 (L) 11/18/2018 1408   ALT 8 01/20/2019 1316   ALT 6 11/18/2018 1408   BILITOT 0.4 01/20/2019 1316   BILITOT 0.2 (L) 11/18/2018 1408       Impression and Plan: Mallory Murphy is a very nice 73 year old white female.  She has multifactorial anemia.  We will see if the Aranesp will help her.  Hopefully this will get her hemoglobin back up to above 11.  I think this will be helpful for her.  I would like to think that her iron studies should be okay.  We will see what her iron studies are.  I talked to her for about 35 minutes.  I explained her with Aranesp is and how it works.  I gave her the side effects of Aranesp with respect to the high blood pressure.  We will try her on Aranesp at 300 mcg next week.  I will then plan to see her back 3 weeks after that and hopefully her hemoglobin will be at least above 10.   Volanda Napoleon, MD 10/8/20202:14 PM

## 2019-01-21 ENCOUNTER — Telehealth: Payer: Self-pay

## 2019-01-21 LAB — PROTEIN ELECTROPHORESIS, SERUM
A/G Ratio: 0.9 (ref 0.7–1.7)
Albumin ELP: 3.5 g/dL (ref 2.9–4.4)
Alpha-1-Globulin: 0.3 g/dL (ref 0.0–0.4)
Alpha-2-Globulin: 0.8 g/dL (ref 0.4–1.0)
Beta Globulin: 0.9 g/dL (ref 0.7–1.3)
Gamma Globulin: 2 g/dL — ABNORMAL HIGH (ref 0.4–1.8)
Globulin, Total: 4 g/dL — ABNORMAL HIGH (ref 2.2–3.9)
M-Spike, %: 1.2 g/dL — ABNORMAL HIGH
Total Protein ELP: 7.5 g/dL (ref 6.0–8.5)

## 2019-01-21 LAB — IRON AND TIBC
Iron: 49 ug/dL (ref 41–142)
Saturation Ratios: 32 % (ref 21–57)
TIBC: 156 ug/dL — ABNORMAL LOW (ref 236–444)
UIBC: 107 ug/dL — ABNORMAL LOW (ref 120–384)

## 2019-01-21 LAB — FERRITIN: Ferritin: 479 ng/mL — ABNORMAL HIGH (ref 11–307)

## 2019-01-21 NOTE — Telephone Encounter (Addendum)
Attached message given to pt via phone. Verbalizes understanding and appreciation. dph  ----- Message from Volanda Napoleon, MD sent at 01/21/2019 10:08 AM EDT ----- Cal - the iron is much better!!  Mallory Murphy

## 2019-01-27 ENCOUNTER — Other Ambulatory Visit: Payer: Self-pay

## 2019-01-27 ENCOUNTER — Inpatient Hospital Stay: Payer: Medicare Other

## 2019-01-27 VITALS — BP 109/55 | HR 99 | Temp 98.7°F | Resp 16

## 2019-01-27 DIAGNOSIS — N189 Chronic kidney disease, unspecified: Secondary | ICD-10-CM | POA: Diagnosis not present

## 2019-01-27 DIAGNOSIS — D500 Iron deficiency anemia secondary to blood loss (chronic): Secondary | ICD-10-CM

## 2019-01-27 MED ORDER — DARBEPOETIN ALFA 300 MCG/0.6ML IJ SOSY
300.0000 ug | PREFILLED_SYRINGE | Freq: Once | INTRAMUSCULAR | Status: AC
  Administered 2019-01-27: 300 ug via SUBCUTANEOUS

## 2019-01-27 MED ORDER — DARBEPOETIN ALFA 300 MCG/0.6ML IJ SOSY
PREFILLED_SYRINGE | INTRAMUSCULAR | Status: AC
  Filled 2019-01-27: qty 0.6

## 2019-01-27 NOTE — Patient Instructions (Signed)
Darbepoetin Alfa injection What is this medicine? DARBEPOETIN ALFA (dar be POE e tin AL fa) helps your body make more red blood cells. It is used to treat anemia caused by chronic kidney failure and chemotherapy. This medicine may be used for other purposes; ask your health care provider or pharmacist if you have questions. COMMON BRAND NAME(S): Aranesp What should I tell my health care provider before I take this medicine? They need to know if you have any of these conditions:  blood clotting disorders or history of blood clots  cancer patient not on chemotherapy  cystic fibrosis  heart disease, such as angina, heart failure, or a history of a heart attack  hemoglobin level of 12 g/dL or greater  high blood pressure  low levels of folate, iron, or vitamin B12  seizures  an unusual or allergic reaction to darbepoetin, erythropoietin, albumin, hamster proteins, latex, other medicines, foods, dyes, or preservatives  pregnant or trying to get pregnant  breast-feeding How should I use this medicine? This medicine is for injection into a vein or under the skin. It is usually given by a health care professional in a hospital or clinic setting. If you get this medicine at home, you will be taught how to prepare and give this medicine. Use exactly as directed. Take your medicine at regular intervals. Do not take your medicine more often than directed. It is important that you put your used needles and syringes in a special sharps container. Do not put them in a trash can. If you do not have a sharps container, call your pharmacist or healthcare provider to get one. A special MedGuide will be given to you by the pharmacist with each prescription and refill. Be sure to read this information carefully each time. Talk to your pediatrician regarding the use of this medicine in children. While this medicine may be used in children as young as 1 month of age for selected conditions, precautions do  apply. Overdosage: If you think you have taken too much of this medicine contact a poison control center or emergency room at once. NOTE: This medicine is only for you. Do not share this medicine with others. What if I miss a dose? If you miss a dose, take it as soon as you can. If it is almost time for your next dose, take only that dose. Do not take double or extra doses. What may interact with this medicine? Do not take this medicine with any of the following medications:  epoetin alfa This list may not describe all possible interactions. Give your health care provider a list of all the medicines, herbs, non-prescription drugs, or dietary supplements you use. Also tell them if you smoke, drink alcohol, or use illegal drugs. Some items may interact with your medicine. What should I watch for while using this medicine? Your condition will be monitored carefully while you are receiving this medicine. You may need blood work done while you are taking this medicine. This medicine may cause a decrease in vitamin B6. You should make sure that you get enough vitamin B6 while you are taking this medicine. Discuss the foods you eat and the vitamins you take with your health care professional. What side effects may I notice from receiving this medicine? Side effects that you should report to your doctor or health care professional as soon as possible:  allergic reactions like skin rash, itching or hives, swelling of the face, lips, or tongue  breathing problems  changes in   vision  chest pain  confusion, trouble speaking or understanding  feeling faint or lightheaded, falls  high blood pressure  muscle aches or pains  pain, swelling, warmth in the leg  rapid weight gain  severe headaches  sudden numbness or weakness of the face, arm or leg  trouble walking, dizziness, loss of balance or coordination  seizures (convulsions)  swelling of the ankles, feet, hands  unusually weak or  tired Side effects that usually do not require medical attention (report to your doctor or health care professional if they continue or are bothersome):  diarrhea  fever, chills (flu-like symptoms)  headaches  nausea, vomiting  redness, stinging, or swelling at site where injected This list may not describe all possible side effects. Call your doctor for medical advice about side effects. You may report side effects to FDA at 1-800-FDA-1088. Where should I keep my medicine? Keep out of the reach of children. Store in a refrigerator between 2 and 8 degrees C (36 and 46 degrees F). Do not freeze. Do not shake. Throw away any unused portion if using a single-dose vial. Throw away any unused medicine after the expiration date. NOTE: This sheet is a summary. It may not cover all possible information. If you have questions about this medicine, talk to your doctor, pharmacist, or health care provider.  2020 Elsevier/Gold Standard (2017-04-15 16:44:20)  

## 2019-02-17 ENCOUNTER — Inpatient Hospital Stay: Payer: Medicare Other | Attending: Hematology & Oncology

## 2019-02-17 ENCOUNTER — Inpatient Hospital Stay (HOSPITAL_BASED_OUTPATIENT_CLINIC_OR_DEPARTMENT_OTHER): Payer: Medicare Other | Admitting: Hematology & Oncology

## 2019-02-17 ENCOUNTER — Encounter: Payer: Self-pay | Admitting: Hematology & Oncology

## 2019-02-17 ENCOUNTER — Other Ambulatory Visit: Payer: Self-pay

## 2019-02-17 ENCOUNTER — Inpatient Hospital Stay: Payer: Medicare Other

## 2019-02-17 VITALS — BP 103/62 | HR 68 | Temp 97.3°F | Resp 18 | Wt 129.0 lb

## 2019-02-17 DIAGNOSIS — D500 Iron deficiency anemia secondary to blood loss (chronic): Secondary | ICD-10-CM

## 2019-02-17 DIAGNOSIS — Z79899 Other long term (current) drug therapy: Secondary | ICD-10-CM | POA: Diagnosis not present

## 2019-02-17 DIAGNOSIS — M069 Rheumatoid arthritis, unspecified: Secondary | ICD-10-CM | POA: Diagnosis not present

## 2019-02-17 DIAGNOSIS — Z23 Encounter for immunization: Secondary | ICD-10-CM | POA: Diagnosis not present

## 2019-02-17 DIAGNOSIS — N189 Chronic kidney disease, unspecified: Secondary | ICD-10-CM | POA: Diagnosis not present

## 2019-02-17 DIAGNOSIS — D631 Anemia in chronic kidney disease: Secondary | ICD-10-CM | POA: Diagnosis present

## 2019-02-17 DIAGNOSIS — N183 Chronic kidney disease, stage 3 unspecified: Secondary | ICD-10-CM

## 2019-02-17 LAB — CBC WITH DIFFERENTIAL (CANCER CENTER ONLY)
Abs Immature Granulocytes: 0.04 10*3/uL (ref 0.00–0.07)
Basophils Absolute: 0 10*3/uL (ref 0.0–0.1)
Basophils Relative: 0 %
Eosinophils Absolute: 0.3 10*3/uL (ref 0.0–0.5)
Eosinophils Relative: 3 %
HCT: 29.3 % — ABNORMAL LOW (ref 36.0–46.0)
Hemoglobin: 9.8 g/dL — ABNORMAL LOW (ref 12.0–15.0)
Immature Granulocytes: 1 %
Lymphocytes Relative: 38 %
Lymphs Abs: 3.1 10*3/uL (ref 0.7–4.0)
MCH: 35.4 pg — ABNORMAL HIGH (ref 26.0–34.0)
MCHC: 33.4 g/dL (ref 30.0–36.0)
MCV: 105.8 fL — ABNORMAL HIGH (ref 80.0–100.0)
Monocytes Absolute: 0.7 10*3/uL (ref 0.1–1.0)
Monocytes Relative: 9 %
Neutro Abs: 3.9 10*3/uL (ref 1.7–7.7)
Neutrophils Relative %: 49 %
Platelet Count: 252 10*3/uL (ref 150–400)
RBC: 2.77 MIL/uL — ABNORMAL LOW (ref 3.87–5.11)
RDW: 19.2 % — ABNORMAL HIGH (ref 11.5–15.5)
WBC Count: 7.9 10*3/uL (ref 4.0–10.5)
nRBC: 0 % (ref 0.0–0.2)

## 2019-02-17 LAB — CMP (CANCER CENTER ONLY)
ALT: 8 U/L (ref 0–44)
AST: 19 U/L (ref 15–41)
Albumin: 3.8 g/dL (ref 3.5–5.0)
Alkaline Phosphatase: 63 U/L (ref 38–126)
Anion gap: 9 (ref 5–15)
BUN: 14 mg/dL (ref 8–23)
CO2: 26 mmol/L (ref 22–32)
Calcium: 8.8 mg/dL — ABNORMAL LOW (ref 8.9–10.3)
Chloride: 103 mmol/L (ref 98–111)
Creatinine: 1.26 mg/dL — ABNORMAL HIGH (ref 0.44–1.00)
GFR, Est AFR Am: 49 mL/min — ABNORMAL LOW (ref >60)
GFR, Estimated: 43 mL/min — ABNORMAL LOW (ref >60)
Glucose, Bld: 120 mg/dL — ABNORMAL HIGH (ref 70–99)
Potassium: 4 mmol/L (ref 3.5–5.1)
Sodium: 138 mmol/L (ref 135–145)
Total Bilirubin: 0.4 mg/dL (ref 0.3–1.2)
Total Protein: 7.3 g/dL (ref 6.5–8.1)

## 2019-02-17 LAB — RETICULOCYTES
Immature Retic Fract: 11.3 % (ref 2.3–15.9)
RBC.: 2.75 MIL/uL — ABNORMAL LOW (ref 3.87–5.11)
Retic Count, Absolute: 38.5 10*3/uL (ref 19.0–186.0)
Retic Ct Pct: 1.4 % (ref 0.4–3.1)

## 2019-02-17 MED ORDER — INFLUENZA VAC SPLIT QUAD 0.5 ML IM SUSY
PREFILLED_SYRINGE | INTRAMUSCULAR | Status: AC
  Filled 2019-02-17: qty 0.5

## 2019-02-17 MED ORDER — INFLUENZA VAC SPLIT QUAD 0.5 ML IM SUSY
0.5000 mL | PREFILLED_SYRINGE | Freq: Once | INTRAMUSCULAR | Status: AC
  Administered 2019-02-17: 0.5 mL via INTRAMUSCULAR

## 2019-02-17 MED ORDER — DARBEPOETIN ALFA 300 MCG/0.6ML IJ SOSY
300.0000 ug | PREFILLED_SYRINGE | Freq: Once | INTRAMUSCULAR | Status: AC
  Administered 2019-02-17: 300 ug via SUBCUTANEOUS

## 2019-02-17 NOTE — Progress Notes (Signed)
Hematology and Oncology Follow Up Visit  Mallory Murphy [id number] 07-02-1945 73 y.o. 02/17/2019   Principle Diagnosis:   Anemia of erythropoitin deficeincy  Anemia of Iron deficiency  Rheumatoid Arthritis  IgG Kappa MGUS  Current Therapy:    IV Iron -- Feraheme given on 11/2018  Aranesp 300 mcg sq for Hgb <11      Interim History:  Mallory Murphy is back for her follow-up.  We actually did give her a dose of Aranesp a couple weeks ago.  This helped her hemoglobin a little bit.  Today, her hemoglobin is 9.8.  She still has an M spike.  I had to believe that this is from the rheumatism.  I suspect this is an MGUS.  Back on 01/20/2019, her M spike was 1.2 g/dL.  Her rheumatoid arthritis is about the same.  She is somewhat debilitated by it.  She really does not have a lot of use of her right arm.  She has had no bleeding.  There has been no change in bowel or bladder habits.  She has had no cough.  It sounds like she will have a quiet Thanksgiving with her family.  Overall, her performance status is ECOG 2.    Medications:  Current Outpatient Medications:  .  escitalopram (LEXAPRO) 10 MG tablet, Take 10 mg by mouth daily., Disp: , Rfl:  .  losartan (COZAAR) 100 MG tablet, 100 mg daily., Disp: , Rfl:  .  oxybutynin (DITROPAN-XL) 5 MG 24 hr tablet, Take 5 mg by mouth at bedtime., Disp: , Rfl:  .  traMADol (ULTRAM) 50 MG tablet, Take 1-2 tablets (50-100 mg total) by mouth every 6 (six) hours as needed for moderate pain., Disp: 56 tablet, Rfl: 0  Allergies:  Allergies  Allergen Reactions  . Macrobid [Nitrofurantoin Macrocrystal] Nausea Only    Past Medical History, Surgical history, Social history, and Family History were reviewed and updated.  Review of Systems: Review of Systems  Constitutional: Positive for fatigue.  HENT:  Negative.   Eyes: Negative.   Respiratory: Negative.   Cardiovascular: Negative.   Gastrointestinal: Positive for constipation.  Endocrine:  Negative.   Genitourinary: Negative.    Musculoskeletal: Positive for arthralgias, back pain, neck pain and neck stiffness.  Neurological: Negative.   Hematological: Negative.   Psychiatric/Behavioral: Negative.     Physical Exam:  weight is 129 lb (58.5 kg). Her temporal temperature is 97.3 F (36.3 C) (abnormal). Her blood pressure is 103/62 and her pulse is 68. Her respiration is 18 and oxygen saturation is 98%.   Wt Readings from Last 3 Encounters:  02/17/19 129 lb (58.5 kg)  01/20/19 131 lb 4 oz (59.5 kg)  11/18/18 132 lb 6.4 oz (60.1 kg)    Physical Exam Vitals signs reviewed.  HENT:     Head: Normocephalic and atraumatic.  Eyes:     Pupils: Pupils are equal, round, and reactive to light.  Neck:     Musculoskeletal: Normal range of motion.  Cardiovascular:     Rate and Rhythm: Normal rate and regular rhythm.     Heart sounds: Normal heart sounds.  Pulmonary:     Effort: Pulmonary effort is normal.     Breath sounds: Normal breath sounds.  Abdominal:     General: Bowel sounds are normal.     Palpations: Abdomen is soft.  Musculoskeletal: Normal range of motion.        General: No tenderness or deformity.     Comments: She has obvious changes  on rheumatoid arthritis.  She has some joint deformities.  She has some slight weakness in her legs which is chronic.  She has decreased range of motion of her joints.  Lymphadenopathy:     Cervical: No cervical adenopathy.  Skin:    General: Skin is warm and dry.     Findings: No erythema or rash.  Neurological:     Mental Status: She is alert and oriented to person, place, and time.  Psychiatric:        Behavior: Behavior normal.        Thought Content: Thought content normal.        Judgment: Judgment normal.      Lab Results  Component Value Date   WBC 7.9 02/17/2019   HGB 9.8 (L) 02/17/2019   HCT 29.3 (L) 02/17/2019   MCV 105.8 (H) 02/17/2019   PLT 252 02/17/2019     Chemistry      Component Value Date/Time    NA 138 02/17/2019 1341   K 4.0 02/17/2019 1341   CL 103 02/17/2019 1341   CO2 26 02/17/2019 1341   BUN 14 02/17/2019 1341   CREATININE 1.26 (H) 02/17/2019 1341   CREATININE 1.19 (H) 05/07/2012 1553      Component Value Date/Time   CALCIUM 8.8 (L) 02/17/2019 1341   ALKPHOS 63 02/17/2019 1341   AST 19 02/17/2019 1341   ALT 8 02/17/2019 1341   BILITOT 0.4 02/17/2019 1341       Impression and Plan: Mallory Murphy is a very nice 73 year old white female.  She has multifactorial anemia.  We will see if the Aranesp will help her.  I know that she has a lot of inflammatory issues with respect to the rheumatism.  This might be suppressing her bone marrow.  I think that the reticulocyte count will be a good indicator as to whether or not her hemoglobin will respond.  Hopefully this will get her hemoglobin back up to above 11.  I think this will be helpful for her.  I would like to think that her iron studies should be okay.  We will see what her iron studies are.  We need to make sure we keep track of the monoclonal studies.  She has an MGUS.   Volanda Napoleon, MD 11/5/20202:24 PM

## 2019-02-17 NOTE — Patient Instructions (Signed)
Darbepoetin Alfa injection What is this medicine? DARBEPOETIN ALFA (dar be POE e tin AL fa) helps your body make more red blood cells. It is used to treat anemia caused by chronic kidney failure and chemotherapy. This medicine may be used for other purposes; ask your health care provider or pharmacist if you have questions. COMMON BRAND NAME(S): Aranesp What should I tell my health care provider before I take this medicine? They need to know if you have any of these conditions:  blood clotting disorders or history of blood clots  cancer patient not on chemotherapy  cystic fibrosis  heart disease, such as angina, heart failure, or a history of a heart attack  hemoglobin level of 12 g/dL or greater  high blood pressure  low levels of folate, iron, or vitamin B12  seizures  an unusual or allergic reaction to darbepoetin, erythropoietin, albumin, hamster proteins, latex, other medicines, foods, dyes, or preservatives  pregnant or trying to get pregnant  breast-feeding How should I use this medicine? This medicine is for injection into a vein or under the skin. It is usually given by a health care professional in a hospital or clinic setting. If you get this medicine at home, you will be taught how to prepare and give this medicine. Use exactly as directed. Take your medicine at regular intervals. Do not take your medicine more often than directed. It is important that you put your used needles and syringes in a special sharps container. Do not put them in a trash can. If you do not have a sharps container, call your pharmacist or healthcare provider to get one. A special MedGuide will be given to you by the pharmacist with each prescription and refill. Be sure to read this information carefully each time. Talk to your pediatrician regarding the use of this medicine in children. While this medicine may be used in children as young as 1 month of age for selected conditions, precautions do  apply. Overdosage: If you think you have taken too much of this medicine contact a poison control center or emergency room at once. NOTE: This medicine is only for you. Do not share this medicine with others. What if I miss a dose? If you miss a dose, take it as soon as you can. If it is almost time for your next dose, take only that dose. Do not take double or extra doses. What may interact with this medicine? Do not take this medicine with any of the following medications:  epoetin alfa This list may not describe all possible interactions. Give your health care provider a list of all the medicines, herbs, non-prescription drugs, or dietary supplements you use. Also tell them if you smoke, drink alcohol, or use illegal drugs. Some items may interact with your medicine. What should I watch for while using this medicine? Your condition will be monitored carefully while you are receiving this medicine. You may need blood work done while you are taking this medicine. This medicine may cause a decrease in vitamin B6. You should make sure that you get enough vitamin B6 while you are taking this medicine. Discuss the foods you eat and the vitamins you take with your health care professional. What side effects may I notice from receiving this medicine? Side effects that you should report to your doctor or health care professional as soon as possible:  allergic reactions like skin rash, itching or hives, swelling of the face, lips, or tongue  breathing problems  changes in   vision  chest pain  confusion, trouble speaking or understanding  feeling faint or lightheaded, falls  high blood pressure  muscle aches or pains  pain, swelling, warmth in the leg  rapid weight gain  severe headaches  sudden numbness or weakness of the face, arm or leg  trouble walking, dizziness, loss of balance or coordination  seizures (convulsions)  swelling of the ankles, feet, hands  unusually weak or  tired Side effects that usually do not require medical attention (report to your doctor or health care professional if they continue or are bothersome):  diarrhea  fever, chills (flu-like symptoms)  headaches  nausea, vomiting  redness, stinging, or swelling at site where injected This list may not describe all possible side effects. Call your doctor for medical advice about side effects. You may report side effects to FDA at 1-800-FDA-1088. Where should I keep my medicine? Keep out of the reach of children. Store in a refrigerator between 2 and 8 degrees C (36 and 46 degrees F). Do not freeze. Do not shake. Throw away any unused portion if using a single-dose vial. Throw away any unused medicine after the expiration date. NOTE: This sheet is a summary. It may not cover all possible information. If you have questions about this medicine, talk to your doctor, pharmacist, or health care provider.  2020 Elsevier/Gold Standard (2017-04-15 16:44:20) Influenza Virus Vaccine injection What is this medicine? INFLUENZA VIRUS VACCINE (in floo EN zuh VAHY ruhs vak SEEN) helps to reduce the risk of getting influenza also known as the flu. The vaccine only helps protect you against some strains of the flu. This medicine may be used for other purposes; ask your health care provider or pharmacist if you have questions. COMMON BRAND NAME(S): Afluria, Afluria Quadrivalent, Agriflu, Alfuria, FLUAD, Fluarix, Fluarix Quadrivalent, Flublok, Flublok Quadrivalent, FLUCELVAX, Flulaval, Fluvirin, Fluzone, Fluzone High-Dose, Fluzone Intradermal What should I tell my health care provider before I take this medicine? They need to know if you have any of these conditions:  bleeding disorder like hemophilia  fever or infection  Guillain-Barre syndrome or other neurological problems  immune system problems  infection with the human immunodeficiency virus (HIV) or AIDS  low blood platelet counts  multiple  sclerosis  an unusual or allergic reaction to influenza virus vaccine, latex, other medicines, foods, dyes, or preservatives. Different brands of vaccines contain different allergens. Some may contain latex or eggs. Talk to your doctor about your allergies to make sure that you get the right vaccine.  pregnant or trying to get pregnant  breast-feeding How should I use this medicine? This vaccine is for injection into a muscle or under the skin. It is given by a health care professional. A copy of Vaccine Information Statements will be given before each vaccination. Read this sheet carefully each time. The sheet may change frequently. Talk to your healthcare provider to see which vaccines are right for you. Some vaccines should not be used in all age groups. Overdosage: If you think you have taken too much of this medicine contact a poison control center or emergency room at once. NOTE: This medicine is only for you. Do not share this medicine with others. What if I miss a dose? This does not apply. What may interact with this medicine?  chemotherapy or radiation therapy  medicines that lower your immune system like etanercept, anakinra, infliximab, and adalimumab  medicines that treat or prevent blood clots like warfarin  phenytoin  steroid medicines like prednisone or cortisone  theophylline  vaccines This list may not describe all possible interactions. Give your health care provider a list of all the medicines, herbs, non-prescription drugs, or dietary supplements you use. Also tell them if you smoke, drink alcohol, or use illegal drugs. Some items may interact with your medicine. What should I watch for while using this medicine? Report any side effects that do not go away within 3 days to your doctor or health care professional. Call your health care provider if any unusual symptoms occur within 6 weeks of receiving this vaccine. You may still catch the flu, but the illness is  not usually as bad. You cannot get the flu from the vaccine. The vaccine will not protect against colds or other illnesses that may cause fever. The vaccine is needed every year. What side effects may I notice from receiving this medicine? Side effects that you should report to your doctor or health care professional as soon as possible:  allergic reactions like skin rash, itching or hives, swelling of the face, lips, or tongue Side effects that usually do not require medical attention (report to your doctor or health care professional if they continue or are bothersome):  fever  headache  muscle aches and pains  pain, tenderness, redness, or swelling at the injection site  tiredness This list may not describe all possible side effects. Call your doctor for medical advice about side effects. You may report side effects to FDA at 1-800-FDA-1088. Where should I keep my medicine? The vaccine will be given by a health care professional in a clinic, pharmacy, doctor's office, or other health care setting. You will not be given vaccine doses to store at home. NOTE: This sheet is a summary. It may not cover all possible information. If you have questions about this medicine, talk to your doctor, pharmacist, or health care provider.  2020 Elsevier/Gold Standard (2018-02-23 08:45:43)

## 2019-02-18 LAB — IRON AND TIBC
Iron: 56 ug/dL (ref 41–142)
Saturation Ratios: 38 % (ref 21–57)
TIBC: 146 ug/dL — ABNORMAL LOW (ref 236–444)
UIBC: 90 ug/dL — ABNORMAL LOW (ref 120–384)

## 2019-02-18 LAB — FERRITIN: Ferritin: 281 ng/mL (ref 11–307)

## 2019-03-17 ENCOUNTER — Inpatient Hospital Stay: Payer: Medicare Other | Admitting: Family

## 2019-03-17 ENCOUNTER — Inpatient Hospital Stay: Payer: Medicare Other

## 2019-03-25 ENCOUNTER — Encounter: Payer: Self-pay | Admitting: Family

## 2019-03-25 ENCOUNTER — Inpatient Hospital Stay: Payer: Medicare Other | Attending: Hematology & Oncology

## 2019-03-25 ENCOUNTER — Other Ambulatory Visit: Payer: Self-pay

## 2019-03-25 ENCOUNTER — Inpatient Hospital Stay: Payer: Medicare Other

## 2019-03-25 ENCOUNTER — Inpatient Hospital Stay (HOSPITAL_BASED_OUTPATIENT_CLINIC_OR_DEPARTMENT_OTHER): Payer: Medicare Other | Admitting: Family

## 2019-03-25 VITALS — BP 122/51 | HR 107 | Temp 97.0°F | Resp 18 | Ht 61.5 in | Wt 130.0 lb

## 2019-03-25 DIAGNOSIS — D500 Iron deficiency anemia secondary to blood loss (chronic): Secondary | ICD-10-CM | POA: Diagnosis not present

## 2019-03-25 DIAGNOSIS — N183 Chronic kidney disease, stage 3 unspecified: Secondary | ICD-10-CM | POA: Diagnosis not present

## 2019-03-25 DIAGNOSIS — N189 Chronic kidney disease, unspecified: Secondary | ICD-10-CM | POA: Insufficient documentation

## 2019-03-25 DIAGNOSIS — D631 Anemia in chronic kidney disease: Secondary | ICD-10-CM

## 2019-03-25 LAB — CBC WITH DIFFERENTIAL (CANCER CENTER ONLY)
Abs Immature Granulocytes: 0.03 10*3/uL (ref 0.00–0.07)
Basophils Absolute: 0 10*3/uL (ref 0.0–0.1)
Basophils Relative: 0 %
Eosinophils Absolute: 0.3 10*3/uL (ref 0.0–0.5)
Eosinophils Relative: 3 %
HCT: 29.9 % — ABNORMAL LOW (ref 36.0–46.0)
Hemoglobin: 10.1 g/dL — ABNORMAL LOW (ref 12.0–15.0)
Immature Granulocytes: 0 %
Lymphocytes Relative: 25 %
Lymphs Abs: 2.4 10*3/uL (ref 0.7–4.0)
MCH: 35.9 pg — ABNORMAL HIGH (ref 26.0–34.0)
MCHC: 33.8 g/dL (ref 30.0–36.0)
MCV: 106.4 fL — ABNORMAL HIGH (ref 80.0–100.0)
Monocytes Absolute: 1 10*3/uL (ref 0.1–1.0)
Monocytes Relative: 10 %
Neutro Abs: 6 10*3/uL (ref 1.7–7.7)
Neutrophils Relative %: 62 %
Platelet Count: 360 10*3/uL (ref 150–400)
RBC: 2.81 MIL/uL — ABNORMAL LOW (ref 3.87–5.11)
RDW: 14.5 % (ref 11.5–15.5)
WBC Count: 9.7 10*3/uL (ref 4.0–10.5)
nRBC: 0 % (ref 0.0–0.2)

## 2019-03-25 LAB — CMP (CANCER CENTER ONLY)
ALT: 7 U/L (ref 0–44)
AST: 14 U/L — ABNORMAL LOW (ref 15–41)
Albumin: 3.7 g/dL (ref 3.5–5.0)
Alkaline Phosphatase: 61 U/L (ref 38–126)
Anion gap: 8 (ref 5–15)
BUN: 14 mg/dL (ref 8–23)
CO2: 26 mmol/L (ref 22–32)
Calcium: 8.9 mg/dL (ref 8.9–10.3)
Chloride: 102 mmol/L (ref 98–111)
Creatinine: 0.85 mg/dL (ref 0.44–1.00)
GFR, Est AFR Am: >60 mL/min (ref >60)
GFR, Estimated: >60 mL/min (ref >60)
Glucose, Bld: 101 mg/dL — ABNORMAL HIGH (ref 70–99)
Potassium: 3.4 mmol/L — ABNORMAL LOW (ref 3.5–5.1)
Sodium: 136 mmol/L (ref 135–145)
Total Bilirubin: 0.4 mg/dL (ref 0.3–1.2)
Total Protein: 7.7 g/dL (ref 6.5–8.1)

## 2019-03-25 MED ORDER — DARBEPOETIN ALFA 300 MCG/0.6ML IJ SOSY
PREFILLED_SYRINGE | INTRAMUSCULAR | Status: AC
  Filled 2019-03-25: qty 0.6

## 2019-03-25 MED ORDER — DARBEPOETIN ALFA 300 MCG/0.6ML IJ SOSY
300.0000 ug | PREFILLED_SYRINGE | Freq: Once | INTRAMUSCULAR | Status: AC
  Administered 2019-03-25: 300 ug via SUBCUTANEOUS

## 2019-03-25 NOTE — Progress Notes (Signed)
Hematology and Oncology Follow Up Visit  Mallory Murphy [id number] 1945-04-27 73 y.o. 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 She is doing well but still has fatigue.  M-spike in October was 1.2.  Hgb is up to 10.1, MCV 106.  No episodes of bleeding. No bruising or petechiae.  No fever, chills, n/v, cough, rash, dizziness, SOB, chest pain, palpitations, abdominal pain or changes in bowel or bladder habits.  She has chronic joint aches, pains and swelling secondary to RA. She states that this is unchanged.  No falls or syncope.  Her appetite comes and goes. She is staying well hydrated. Her weight is stable.   ECOG Performance Status: 1 - Symptomatic but completely ambulatory  Medications:  Allergies as of 03/25/2019      Reactions   Macrobid [nitrofurantoin Macrocrystal] Nausea Only      Medication List       Accurate as of March 25, 2019 12:15 PM. If you have any questions, ask your nurse or doctor.        escitalopram 10 MG tablet Commonly known as: LEXAPRO Take 10 mg by mouth daily.   losartan 100 MG tablet Commonly known as: COZAAR 100 mg daily.   oxybutynin 5 MG 24 hr tablet Commonly known as: DITROPAN-XL Take 5 mg by mouth at bedtime.   traMADol 50 MG tablet Commonly known as: ULTRAM Take 1-2 tablets (50-100 mg total) by mouth every 6 (six) hours as needed for moderate pain.       Allergies:  Allergies  Allergen Reactions  . Macrobid [Nitrofurantoin Macrocrystal] Nausea Only    Past Medical History, Surgical history, Social history, and Family History were reviewed and updated.  Review of Systems: All other 10 point review of systems is negative.   Physical Exam:  height is 5' 1.5" (1.562 m) and weight is 130 lb (59  kg). Her temporal temperature is 97 F (36.1 C) (abnormal). Her blood pressure is 122/51 (abnormal) and her pulse is 107 (abnormal). Her respiration is 18 and oxygen saturation is 100%.   Wt Readings from Last 3 Encounters:  03/25/19 130 lb (59 kg)  02/17/19 129 lb (58.5 kg)  01/20/19 131 lb 4 oz (59.5 kg)    Ocular: Sclerae unicteric, pupils equal, round and reactive to light Ear-nose-throat: Oropharynx clear, dentition fair Lymphatic: No cervical or supraclavicular adenopathy Lungs no rales or rhonchi, good excursion bilaterally Heart regular rate and rhythm, no murmur appreciated Abd soft, nontender, positive bowel sounds, no liver or spleen tip palpated on exam, no fluid wave  MSK no focal spinal tenderness, no joint edema Neuro: non-focal, well-oriented, appropriate affect Breasts: Deferred   Lab Results  Component Value Date   WBC 9.7 03/25/2019   HGB 10.1 (L) 03/25/2019   HCT 29.9 (L) 03/25/2019   MCV 106.4 (H) 03/25/2019   PLT 360 03/25/2019   Lab Results  Component Value Date   FERRITIN 281 02/17/2019   IRON 56 02/17/2019   TIBC 146 (L) 02/17/2019   UIBC 90 (L) 02/17/2019   IRONPCTSAT 38 02/17/2019   Lab Results  Component Value Date   RETICCTPCT 1.4 02/17/2019   RBC 2.81 (L) 03/25/2019   No results found for: KPAFRELGTCHN, LAMBDASER, KAPLAMBRATIO Lab Results  Component Value Date  IGGSERUM 2,140 (H) 11/18/2018   IGA 360 11/18/2018   IGMSERUM 93 11/18/2018   Lab Results  Component Value Date   TOTALPROTELP 7.5 01/20/2019   ALBUMINELP 3.5 01/20/2019   A1GS 0.3 01/20/2019   A2GS 0.8 01/20/2019   BETS 0.9 01/20/2019   GAMS 2.0 (H) 01/20/2019   MSPIKE 1.2 (H) 01/20/2019   SPEI Comment 01/20/2019     Chemistry      Component Value Date/Time   NA 138 02/17/2019 1341   K 4.0 02/17/2019 1341   CL 103 02/17/2019 1341   CO2 26 02/17/2019 1341   BUN 14 02/17/2019 1341   CREATININE 1.26 (H) 02/17/2019 1341   CREATININE 1.19 (H) 05/07/2012 1553        Component Value Date/Time   CALCIUM 8.8 (L) 02/17/2019 1341   ALKPHOS 63 02/17/2019 1341   AST 19 02/17/2019 1341   ALT 8 02/17/2019 1341   BILITOT 0.4 02/17/2019 1341       Impression and Plan: Mallory Murphy is a very pleasant 73 yo caucasian female with multifactorial anemia and MGUS.  She received Aranesp today for Hgb of 10.1.  We will see what her iron studies show and bring her in for infusion if needed.  We will plan to see her back in another month.  She will contact our office with any questions or concerns. We can certainly see her sooner if needed.   Laverna Peace, NP 12/11/202012:15 PM

## 2019-03-25 NOTE — Patient Instructions (Signed)
Darbepoetin Alfa injection What is this medicine? DARBEPOETIN ALFA (dar be POE e tin AL fa) helps your body make more red blood cells. It is used to treat anemia caused by chronic kidney failure and chemotherapy. This medicine may be used for other purposes; ask your health care provider or pharmacist if you have questions. COMMON BRAND NAME(S): Aranesp What should I tell my health care provider before I take this medicine? They need to know if you have any of these conditions:  blood clotting disorders or history of blood clots  cancer patient not on chemotherapy  cystic fibrosis  heart disease, such as angina, heart failure, or a history of a heart attack  hemoglobin level of 12 g/dL or greater  high blood pressure  low levels of folate, iron, or vitamin B12  seizures  an unusual or allergic reaction to darbepoetin, erythropoietin, albumin, hamster proteins, latex, other medicines, foods, dyes, or preservatives  pregnant or trying to get pregnant  breast-feeding How should I use this medicine? This medicine is for injection into a vein or under the skin. It is usually given by a health care professional in a hospital or clinic setting. If you get this medicine at home, you will be taught how to prepare and give this medicine. Use exactly as directed. Take your medicine at regular intervals. Do not take your medicine more often than directed. It is important that you put your used needles and syringes in a special sharps container. Do not put them in a trash can. If you do not have a sharps container, call your pharmacist or healthcare provider to get one. A special MedGuide will be given to you by the pharmacist with each prescription and refill. Be sure to read this information carefully each time. Talk to your pediatrician regarding the use of this medicine in children. While this medicine may be used in children as young as 1 month of age for selected conditions, precautions do  apply. Overdosage: If you think you have taken too much of this medicine contact a poison control center or emergency room at once. NOTE: This medicine is only for you. Do not share this medicine with others. What if I miss a dose? If you miss a dose, take it as soon as you can. If it is almost time for your next dose, take only that dose. Do not take double or extra doses. What may interact with this medicine? Do not take this medicine with any of the following medications:  epoetin alfa This list may not describe all possible interactions. Give your health care provider a list of all the medicines, herbs, non-prescription drugs, or dietary supplements you use. Also tell them if you smoke, drink alcohol, or use illegal drugs. Some items may interact with your medicine. What should I watch for while using this medicine? Your condition will be monitored carefully while you are receiving this medicine. You may need blood work done while you are taking this medicine. This medicine may cause a decrease in vitamin B6. You should make sure that you get enough vitamin B6 while you are taking this medicine. Discuss the foods you eat and the vitamins you take with your health care professional. What side effects may I notice from receiving this medicine? Side effects that you should report to your doctor or health care professional as soon as possible:  allergic reactions like skin rash, itching or hives, swelling of the face, lips, or tongue  breathing problems  changes in   vision  chest pain  confusion, trouble speaking or understanding  feeling faint or lightheaded, falls  high blood pressure  muscle aches or pains  pain, swelling, warmth in the leg  rapid weight gain  severe headaches  sudden numbness or weakness of the face, arm or leg  trouble walking, dizziness, loss of balance or coordination  seizures (convulsions)  swelling of the ankles, feet, hands  unusually weak or  tired Side effects that usually do not require medical attention (report to your doctor or health care professional if they continue or are bothersome):  diarrhea  fever, chills (flu-like symptoms)  headaches  nausea, vomiting  redness, stinging, or swelling at site where injected This list may not describe all possible side effects. Call your doctor for medical advice about side effects. You may report side effects to FDA at 1-800-FDA-1088. Where should I keep my medicine? Keep out of the reach of children. Store in a refrigerator between 2 and 8 degrees C (36 and 46 degrees F). Do not freeze. Do not shake. Throw away any unused portion if using a single-dose vial. Throw away any unused medicine after the expiration date. NOTE: This sheet is a summary. It may not cover all possible information. If you have questions about this medicine, talk to your doctor, pharmacist, or health care provider.  2020 Elsevier/Gold Standard (2017-04-15 16:44:20)  

## 2019-03-26 LAB — IGG, IGA, IGM
IgA: 355 mg/dL (ref 64–422)
IgG (Immunoglobin G), Serum: 2265 mg/dL — ABNORMAL HIGH (ref 586–1602)
IgM (Immunoglobulin M), Srm: 90 mg/dL (ref 26–217)

## 2019-03-28 LAB — KAPPA/LAMBDA LIGHT CHAINS
Kappa free light chain: 66.9 mg/L — ABNORMAL HIGH (ref 3.3–19.4)
Kappa, lambda light chain ratio: 1.43 (ref 0.26–1.65)
Lambda free light chains: 46.9 mg/L — ABNORMAL HIGH (ref 5.7–26.3)

## 2019-03-28 LAB — IRON AND TIBC
Iron: 36 ug/dL — ABNORMAL LOW (ref 41–142)
Saturation Ratios: 22 % (ref 21–57)
TIBC: 161 ug/dL — ABNORMAL LOW (ref 236–444)
UIBC: 125 ug/dL (ref 120–384)

## 2019-03-28 LAB — FERRITIN: Ferritin: 251 ng/mL (ref 11–307)

## 2019-03-29 LAB — PROTEIN ELECTROPHORESIS, SERUM, WITH REFLEX
A/G Ratio: 0.8 (ref 0.7–1.7)
Albumin ELP: 3.3 g/dL (ref 2.9–4.4)
Alpha-1-Globulin: 0.3 g/dL (ref 0.0–0.4)
Alpha-2-Globulin: 0.8 g/dL (ref 0.4–1.0)
Beta Globulin: 1 g/dL (ref 0.7–1.3)
Gamma Globulin: 2 g/dL — ABNORMAL HIGH (ref 0.4–1.8)
Globulin, Total: 4.1 g/dL — ABNORMAL HIGH (ref 2.2–3.9)
M-Spike, %: 1.2 g/dL — ABNORMAL HIGH
SPEP Interpretation: 0
Total Protein ELP: 7.4 g/dL (ref 6.0–8.5)

## 2019-03-29 LAB — IMMUNOFIXATION REFLEX, SERUM
IgA: 395 mg/dL (ref 64–422)
IgG (Immunoglobin G), Serum: 2400 mg/dL — ABNORMAL HIGH (ref 586–1602)
IgM (Immunoglobulin M), Srm: 104 mg/dL (ref 26–217)

## 2019-03-30 ENCOUNTER — Telehealth: Payer: Self-pay | Admitting: *Deleted

## 2019-03-30 NOTE — Telephone Encounter (Signed)
-----   Message from Volanda Napoleon, MD sent at 03/29/2019  4:51 PM EST ----- Call - the abnormal protein is holding stable at 1.2.  this is still quite low!!!  PETE

## 2019-03-30 NOTE — Telephone Encounter (Signed)
As noted below by Dr. Marin Olp, I informed the patient that the abnormal protein is holding stable at 1.2. She verbalized understanding.

## 2019-04-22 ENCOUNTER — Inpatient Hospital Stay (HOSPITAL_BASED_OUTPATIENT_CLINIC_OR_DEPARTMENT_OTHER): Payer: Medicare Other | Admitting: Family

## 2019-04-22 ENCOUNTER — Ambulatory Visit: Payer: Medicare Other | Admitting: Hematology & Oncology

## 2019-04-22 ENCOUNTER — Inpatient Hospital Stay: Payer: Medicare Other

## 2019-04-22 ENCOUNTER — Inpatient Hospital Stay: Payer: Medicare Other | Attending: Hematology & Oncology

## 2019-04-22 ENCOUNTER — Encounter: Payer: Self-pay | Admitting: Family

## 2019-04-22 ENCOUNTER — Other Ambulatory Visit: Payer: Self-pay

## 2019-04-22 VITALS — BP 158/83 | HR 89 | Temp 97.3°F | Resp 18 | Ht 61.0 in | Wt 128.8 lb

## 2019-04-22 DIAGNOSIS — D500 Iron deficiency anemia secondary to blood loss (chronic): Secondary | ICD-10-CM

## 2019-04-22 DIAGNOSIS — D631 Anemia in chronic kidney disease: Secondary | ICD-10-CM | POA: Diagnosis present

## 2019-04-22 DIAGNOSIS — N189 Chronic kidney disease, unspecified: Secondary | ICD-10-CM | POA: Diagnosis not present

## 2019-04-22 DIAGNOSIS — N183 Chronic kidney disease, stage 3 unspecified: Secondary | ICD-10-CM

## 2019-04-22 LAB — CBC WITH DIFFERENTIAL (CANCER CENTER ONLY)
Abs Immature Granulocytes: 0.02 10*3/uL (ref 0.00–0.07)
Basophils Absolute: 0 10*3/uL (ref 0.0–0.1)
Basophils Relative: 1 %
Eosinophils Absolute: 0.4 10*3/uL (ref 0.0–0.5)
Eosinophils Relative: 5 %
HCT: 32.3 % — ABNORMAL LOW (ref 36.0–46.0)
Hemoglobin: 10.7 g/dL — ABNORMAL LOW (ref 12.0–15.0)
Immature Granulocytes: 0 %
Lymphocytes Relative: 32 %
Lymphs Abs: 2.5 10*3/uL (ref 0.7–4.0)
MCH: 35.2 pg — ABNORMAL HIGH (ref 26.0–34.0)
MCHC: 33.1 g/dL (ref 30.0–36.0)
MCV: 106.3 fL — ABNORMAL HIGH (ref 80.0–100.0)
Monocytes Absolute: 0.7 10*3/uL (ref 0.1–1.0)
Monocytes Relative: 10 %
Neutro Abs: 4.1 10*3/uL (ref 1.7–7.7)
Neutrophils Relative %: 52 %
Platelet Count: 307 10*3/uL (ref 150–400)
RBC: 3.04 MIL/uL — ABNORMAL LOW (ref 3.87–5.11)
RDW: 15.1 % (ref 11.5–15.5)
WBC Count: 7.7 10*3/uL (ref 4.0–10.5)
nRBC: 0 % (ref 0.0–0.2)

## 2019-04-22 LAB — CMP (CANCER CENTER ONLY)
ALT: 9 U/L (ref 0–44)
AST: 17 U/L (ref 15–41)
Albumin: 3.7 g/dL (ref 3.5–5.0)
Alkaline Phosphatase: 66 U/L (ref 38–126)
Anion gap: 7 (ref 5–15)
BUN: 22 mg/dL (ref 8–23)
CO2: 28 mmol/L (ref 22–32)
Calcium: 9 mg/dL (ref 8.9–10.3)
Chloride: 102 mmol/L (ref 98–111)
Creatinine: 1.21 mg/dL — ABNORMAL HIGH (ref 0.44–1.00)
GFR, Est AFR Am: 51 mL/min — ABNORMAL LOW (ref >60)
GFR, Estimated: 44 mL/min — ABNORMAL LOW (ref >60)
Glucose, Bld: 94 mg/dL (ref 70–99)
Potassium: 3.8 mmol/L (ref 3.5–5.1)
Sodium: 137 mmol/L (ref 135–145)
Total Bilirubin: 0.4 mg/dL (ref 0.3–1.2)
Total Protein: 7.5 g/dL (ref 6.5–8.1)

## 2019-04-22 MED ORDER — DARBEPOETIN ALFA 300 MCG/0.6ML IJ SOSY
PREFILLED_SYRINGE | INTRAMUSCULAR | Status: AC
  Filled 2019-04-22: qty 0.6

## 2019-04-22 MED ORDER — DARBEPOETIN ALFA 300 MCG/0.6ML IJ SOSY
300.0000 ug | PREFILLED_SYRINGE | Freq: Once | INTRAMUSCULAR | Status: AC
  Administered 2019-04-22: 300 ug via SUBCUTANEOUS

## 2019-04-22 NOTE — Progress Notes (Signed)
Hematology and Oncology Follow Up Visit  Mallory Murphy [id number] 1945/07/02 74 y.o. 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 She is doing well but has noted fatigue. She states that she normally does not sleep well.  Hgb today is 10.7.  M-spike was 1.2 in December. IgG level 2,265 mg/dL and kappa light chains 6.69 mg/dL.  No episodes of bleeding. No bruising or petechiae.  No fever, chills, n/v, cough, rash, dizziness, SOB, chest pain, palpitations, abdominal pain or changes in bowel or bladder habits.  She has swelling and tenderness in her joints secondary to RA.  No falls or syncopal episodes to report.  She has maintained a good appetite and is staying well hydrated. Her weight is stable.   ECOG Performance Status: 1 - Symptomatic but completely ambulatory  Medications:  Allergies as of 04/22/2019      Reactions   Macrobid [nitrofurantoin Macrocrystal] Nausea Only      Medication List       Accurate as of April 22, 2019  2:18 PM. If you have any questions, ask your nurse or doctor.        escitalopram 10 MG tablet Commonly known as: LEXAPRO Take 10 mg by mouth daily.   losartan 100 MG tablet Commonly known as: COZAAR 100 mg daily.   oxybutynin 5 MG 24 hr tablet Commonly known as: DITROPAN-XL Take 5 mg by mouth at bedtime.   traMADol 50 MG tablet Commonly known as: ULTRAM Take 1-2 tablets (50-100 mg total) by mouth every 6 (six) hours as needed for moderate pain.       Allergies:  Allergies  Allergen Reactions  . Macrobid [Nitrofurantoin Macrocrystal] Nausea Only    Past Medical History, Surgical history, Social history, and Family History were reviewed and updated.  Review of Systems: All other 10 point review of systems is  negative.   Physical Exam:  height is 5\' 1"  (1.549 m) and weight is 128 lb 12.8 oz (58.4 kg). Her temporal temperature is 97.3 F (36.3 C) (abnormal). Her blood pressure is 158/83 (abnormal) and her pulse is 89. Her respiration is 18 and oxygen saturation is 95%.   Wt Readings from Last 3 Encounters:  04/22/19 128 lb 12.8 oz (58.4 kg)  03/25/19 130 lb (59 kg)  02/17/19 129 lb (58.5 kg)    Ocular: Sclerae unicteric, pupils equal, round and reactive to light Ear-nose-throat: Oropharynx clear, dentition fair Lymphatic: No cervical or supraclavicular adenopathy Lungs no rales or rhonchi, good excursion bilaterally Heart regular rate and rhythm, no murmur appreciated Abd soft, nontender, positive bowel sounds, no liver or spleen tip palpated on exam, no fluid wave  MSK no focal spinal tenderness, no joint edema Neuro: non-focal, well-oriented, appropriate affect Breasts: Deferred   Lab Results  Component Value Date   WBC 7.7 04/22/2019   HGB 10.7 (L) 04/22/2019   HCT 32.3 (L) 04/22/2019   MCV 106.3 (H) 04/22/2019   PLT 307 04/22/2019   Lab Results  Component Value Date   FERRITIN 251 03/25/2019   IRON 36 (L) 03/25/2019   TIBC 161 (L) 03/25/2019   UIBC 125 03/25/2019   IRONPCTSAT 22 03/25/2019   Lab Results  Component Value Date   RETICCTPCT 1.4 02/17/2019   RBC 3.04 (L) 04/22/2019  Lab Results  Component Value Date   KPAFRELGTCHN 66.9 (H) 03/25/2019   LAMBDASER 46.9 (H) 03/25/2019   KAPLAMBRATIO 1.43 03/25/2019   Lab Results  Component Value Date   IGGSERUM 2,265 (H) 03/25/2019   IGGSERUM 2,400 (H) 03/25/2019   IGA 355 03/25/2019   IGA 395 03/25/2019   IGMSERUM 90 03/25/2019   IGMSERUM 104 03/25/2019   Lab Results  Component Value Date   TOTALPROTELP 7.4 03/25/2019   ALBUMINELP 3.3 03/25/2019   A1GS 0.3 03/25/2019   A2GS 0.8 03/25/2019   BETS 1.0 03/25/2019   GAMS 2.0 (H) 03/25/2019   MSPIKE 1.2 (H) 03/25/2019   SPEI Comment 01/20/2019     Chemistry        Component Value Date/Time   NA 137 04/22/2019 1312   K 3.8 04/22/2019 1312   CL 102 04/22/2019 1312   CO2 28 04/22/2019 1312   BUN 22 04/22/2019 1312   CREATININE 1.21 (H) 04/22/2019 1312   CREATININE 1.19 (H) 05/07/2012 1553      Component Value Date/Time   CALCIUM 9.0 04/22/2019 1312   ALKPHOS 66 04/22/2019 1312   AST 17 04/22/2019 1312   ALT 9 04/22/2019 1312   BILITOT 0.4 04/22/2019 1312       Impression and Plan: Mallory Murphy is a very pleasant 74 yo caucasian female with multifactorial anemia and MGUS possibly reactive due to RA.  Her Hgb is improving with Aranesp. We will proceed with injection today.  Protein studies are pending.  We will see her back in another month.  She will contact our office with any questions or concerns. We can certainly see her sooner if needed.   Laverna Peace, NP 1/8/20212:18 PM

## 2019-04-22 NOTE — Patient Instructions (Signed)
Darbepoetin Alfa injection What is this medicine? DARBEPOETIN ALFA (dar be POE e tin AL fa) helps your body make more red blood cells. It is used to treat anemia caused by chronic kidney failure and chemotherapy. This medicine may be used for other purposes; ask your health care provider or pharmacist if you have questions. COMMON BRAND NAME(S): Aranesp What should I tell my health care provider before I take this medicine? They need to know if you have any of these conditions:  blood clotting disorders or history of blood clots  cancer patient not on chemotherapy  cystic fibrosis  heart disease, such as angina, heart failure, or a history of a heart attack  hemoglobin level of 12 g/dL or greater  high blood pressure  low levels of folate, iron, or vitamin B12  seizures  an unusual or allergic reaction to darbepoetin, erythropoietin, albumin, hamster proteins, latex, other medicines, foods, dyes, or preservatives  pregnant or trying to get pregnant  breast-feeding How should I use this medicine? This medicine is for injection into a vein or under the skin. It is usually given by a health care professional in a hospital or clinic setting. If you get this medicine at home, you will be taught how to prepare and give this medicine. Use exactly as directed. Take your medicine at regular intervals. Do not take your medicine more often than directed. It is important that you put your used needles and syringes in a special sharps container. Do not put them in a trash can. If you do not have a sharps container, call your pharmacist or healthcare provider to get one. A special MedGuide will be given to you by the pharmacist with each prescription and refill. Be sure to read this information carefully each time. Talk to your pediatrician regarding the use of this medicine in children. While this medicine may be used in children as young as 1 month of age for selected conditions, precautions do  apply. Overdosage: If you think you have taken too much of this medicine contact a poison control center or emergency room at once. NOTE: This medicine is only for you. Do not share this medicine with others. What if I miss a dose? If you miss a dose, take it as soon as you can. If it is almost time for your next dose, take only that dose. Do not take double or extra doses. What may interact with this medicine? Do not take this medicine with any of the following medications:  epoetin alfa This list may not describe all possible interactions. Give your health care provider a list of all the medicines, herbs, non-prescription drugs, or dietary supplements you use. Also tell them if you smoke, drink alcohol, or use illegal drugs. Some items may interact with your medicine. What should I watch for while using this medicine? Your condition will be monitored carefully while you are receiving this medicine. You may need blood work done while you are taking this medicine. This medicine may cause a decrease in vitamin B6. You should make sure that you get enough vitamin B6 while you are taking this medicine. Discuss the foods you eat and the vitamins you take with your health care professional. What side effects may I notice from receiving this medicine? Side effects that you should report to your doctor or health care professional as soon as possible:  allergic reactions like skin rash, itching or hives, swelling of the face, lips, or tongue  breathing problems  changes in   vision  chest pain  confusion, trouble speaking or understanding  feeling faint or lightheaded, falls  high blood pressure  muscle aches or pains  pain, swelling, warmth in the leg  rapid weight gain  severe headaches  sudden numbness or weakness of the face, arm or leg  trouble walking, dizziness, loss of balance or coordination  seizures (convulsions)  swelling of the ankles, feet, hands  unusually weak or  tired Side effects that usually do not require medical attention (report to your doctor or health care professional if they continue or are bothersome):  diarrhea  fever, chills (flu-like symptoms)  headaches  nausea, vomiting  redness, stinging, or swelling at site where injected This list may not describe all possible side effects. Call your doctor for medical advice about side effects. You may report side effects to FDA at 1-800-FDA-1088. Where should I keep my medicine? Keep out of the reach of children. Store in a refrigerator between 2 and 8 degrees C (36 and 46 degrees F). Do not freeze. Do not shake. Throw away any unused portion if using a single-dose vial. Throw away any unused medicine after the expiration date. NOTE: This sheet is a summary. It may not cover all possible information. If you have questions about this medicine, talk to your doctor, pharmacist, or health care provider.  2020 Elsevier/Gold Standard (2017-04-15 16:44:20)  

## 2019-04-23 LAB — IGG, IGA, IGM
IgA: 336 mg/dL (ref 64–422)
IgG (Immunoglobin G), Serum: 2260 mg/dL — ABNORMAL HIGH (ref 586–1602)
IgM (Immunoglobulin M), Srm: 90 mg/dL (ref 26–217)

## 2019-04-25 ENCOUNTER — Telehealth: Payer: Self-pay | Admitting: Family

## 2019-04-25 LAB — IRON AND TIBC
Iron: 55 ug/dL (ref 41–142)
Saturation Ratios: 33 % (ref 21–57)
TIBC: 168 ug/dL — ABNORMAL LOW (ref 236–444)
UIBC: 113 ug/dL — ABNORMAL LOW (ref 120–384)

## 2019-04-25 LAB — PROTEIN ELECTROPHORESIS, SERUM
A/G Ratio: 0.8 (ref 0.7–1.7)
Albumin ELP: 3.3 g/dL (ref 2.9–4.4)
Alpha-1-Globulin: 0.3 g/dL (ref 0.0–0.4)
Alpha-2-Globulin: 0.7 g/dL (ref 0.4–1.0)
Beta Globulin: 0.9 g/dL (ref 0.7–1.3)
Gamma Globulin: 2.1 g/dL — ABNORMAL HIGH (ref 0.4–1.8)
Globulin, Total: 4 g/dL — ABNORMAL HIGH (ref 2.2–3.9)
M-Spike, %: 1.2 g/dL — ABNORMAL HIGH
Total Protein ELP: 7.3 g/dL (ref 6.0–8.5)

## 2019-04-25 LAB — KAPPA/LAMBDA LIGHT CHAINS
Kappa free light chain: 60.2 mg/L — ABNORMAL HIGH (ref 3.3–19.4)
Kappa, lambda light chain ratio: 1.44 (ref 0.26–1.65)
Lambda free light chains: 41.7 mg/L — ABNORMAL HIGH (ref 5.7–26.3)

## 2019-04-25 LAB — FERRITIN: Ferritin: 206 ng/mL (ref 11–307)

## 2019-04-25 NOTE — Telephone Encounter (Signed)
Called and LMVM for patient with updated appointment schedule per 1/8 los

## 2019-05-23 ENCOUNTER — Inpatient Hospital Stay: Payer: Medicare Other

## 2019-05-23 ENCOUNTER — Encounter (INDEPENDENT_AMBULATORY_CARE_PROVIDER_SITE_OTHER): Payer: Self-pay

## 2019-05-23 ENCOUNTER — Inpatient Hospital Stay: Payer: Medicare Other | Admitting: Family

## 2019-05-30 ENCOUNTER — Inpatient Hospital Stay: Payer: Medicare Other | Attending: Hematology & Oncology

## 2019-05-30 ENCOUNTER — Inpatient Hospital Stay: Payer: Medicare Other

## 2019-05-30 ENCOUNTER — Inpatient Hospital Stay (HOSPITAL_BASED_OUTPATIENT_CLINIC_OR_DEPARTMENT_OTHER): Payer: Medicare Other | Admitting: Family

## 2019-05-30 ENCOUNTER — Encounter: Payer: Self-pay | Admitting: Family

## 2019-05-30 ENCOUNTER — Other Ambulatory Visit: Payer: Self-pay

## 2019-05-30 VITALS — BP 103/59 | HR 95 | Temp 97.3°F | Resp 18 | Ht 61.0 in | Wt 126.0 lb

## 2019-05-30 DIAGNOSIS — D500 Iron deficiency anemia secondary to blood loss (chronic): Secondary | ICD-10-CM

## 2019-05-30 DIAGNOSIS — D631 Anemia in chronic kidney disease: Secondary | ICD-10-CM | POA: Diagnosis present

## 2019-05-30 DIAGNOSIS — E611 Iron deficiency: Secondary | ICD-10-CM | POA: Diagnosis not present

## 2019-05-30 DIAGNOSIS — D472 Monoclonal gammopathy: Secondary | ICD-10-CM | POA: Diagnosis not present

## 2019-05-30 DIAGNOSIS — M069 Rheumatoid arthritis, unspecified: Secondary | ICD-10-CM | POA: Diagnosis not present

## 2019-05-30 DIAGNOSIS — N189 Chronic kidney disease, unspecified: Secondary | ICD-10-CM | POA: Diagnosis not present

## 2019-05-30 LAB — CBC WITH DIFFERENTIAL (CANCER CENTER ONLY)
Abs Immature Granulocytes: 0.02 10*3/uL (ref 0.00–0.07)
Basophils Absolute: 0 10*3/uL (ref 0.0–0.1)
Basophils Relative: 0 %
Eosinophils Absolute: 0.3 10*3/uL (ref 0.0–0.5)
Eosinophils Relative: 4 %
HCT: 33.6 % — ABNORMAL LOW (ref 36.0–46.0)
Hemoglobin: 11.3 g/dL — ABNORMAL LOW (ref 12.0–15.0)
Immature Granulocytes: 0 %
Lymphocytes Relative: 26 %
Lymphs Abs: 2.4 10*3/uL (ref 0.7–4.0)
MCH: 34.2 pg — ABNORMAL HIGH (ref 26.0–34.0)
MCHC: 33.6 g/dL (ref 30.0–36.0)
MCV: 101.8 fL — ABNORMAL HIGH (ref 80.0–100.0)
Monocytes Absolute: 0.7 10*3/uL (ref 0.1–1.0)
Monocytes Relative: 8 %
Neutro Abs: 5.8 10*3/uL (ref 1.7–7.7)
Neutrophils Relative %: 62 %
Platelet Count: 364 10*3/uL (ref 150–400)
RBC: 3.3 MIL/uL — ABNORMAL LOW (ref 3.87–5.11)
RDW: 15.3 % (ref 11.5–15.5)
WBC Count: 9.3 10*3/uL (ref 4.0–10.5)
nRBC: 0 % (ref 0.0–0.2)

## 2019-05-30 LAB — CMP (CANCER CENTER ONLY)
ALT: 7 U/L (ref 0–44)
AST: 15 U/L (ref 15–41)
Albumin: 3.7 g/dL (ref 3.5–5.0)
Alkaline Phosphatase: 71 U/L (ref 38–126)
Anion gap: 9 (ref 5–15)
BUN: 15 mg/dL (ref 8–23)
CO2: 26 mmol/L (ref 22–32)
Calcium: 9.5 mg/dL (ref 8.9–10.3)
Chloride: 100 mmol/L (ref 98–111)
Creatinine: 1.15 mg/dL — ABNORMAL HIGH (ref 0.44–1.00)
GFR, Est AFR Am: 55 mL/min — ABNORMAL LOW (ref >60)
GFR, Estimated: 47 mL/min — ABNORMAL LOW (ref >60)
Glucose, Bld: 141 mg/dL — ABNORMAL HIGH (ref 70–99)
Potassium: 4 mmol/L (ref 3.5–5.1)
Sodium: 135 mmol/L (ref 135–145)
Total Bilirubin: 0.4 mg/dL (ref 0.3–1.2)
Total Protein: 7.9 g/dL (ref 6.5–8.1)

## 2019-05-30 NOTE — Progress Notes (Signed)
Hematology and Oncology Follow Up Visit  Mallory Murphy [id number] 1945/06/26 74 y.o. 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 She is doing well but still has some fatigue.  Hgb is improved at 11.3 and MCV 101.8.  Last month M-spike was stable at 1.2, IgG 2,260 and kappa light chains 6.02mg /dL. No episodes of bleeding. No abnormal bruising or petechiae.  No fever, chills, n/v, cough, rash, dizziness, SOB, chest pain, palpitations, abdominal pain or changes in bowel or bladder habits at this time.  Swelling and tenderness in her joints comes and goes due to RA. She is currently on Simponi Aria infusions every 8 weeks.  Appetite is unchanged. She admits that she needs to better hydrate daily.   ECOG Performance Status: 1 - Symptomatic but completely ambulatory  Medications:  Allergies as of 05/30/2019      Reactions   Macrobid [nitrofurantoin Macrocrystal] Nausea Only      Medication List       Accurate as of May 30, 2019  2:30 PM. If you have any questions, ask your nurse or doctor.        escitalopram 10 MG tablet Commonly known as: LEXAPRO Take 10 mg by mouth daily.   losartan 100 MG tablet Commonly known as: COZAAR 100 mg daily.   oxybutynin 5 MG 24 hr tablet Commonly known as: DITROPAN-XL Take 5 mg by mouth at bedtime.   traMADol 50 MG tablet Commonly known as: ULTRAM Take 1-2 tablets (50-100 mg total) by mouth every 6 (six) hours as needed for moderate pain.       Allergies:  Allergies  Allergen Reactions  . Macrobid [Nitrofurantoin Macrocrystal] Nausea Only    Past Medical History, Surgical history, Social history, and Family History were reviewed and updated.  Review of Systems: All other 10 point review of systems is  negative.   Physical Exam:  height is 5\' 1"  (1.549 m) and weight is 126 lb (57.2 kg). Her temporal temperature is 97.3 F (36.3 C) (abnormal). Her blood pressure is 103/59 (abnormal) and her pulse is 95. Her respiration is 18 and oxygen saturation is 98%.   Wt Readings from Last 3 Encounters:  05/30/19 126 lb (57.2 kg)  04/22/19 128 lb 12.8 oz (58.4 kg)  03/25/19 130 lb (59 kg)    Ocular: Sclerae unicteric, pupils equal, round and reactive to light Ear-nose-throat: Oropharynx clear, dentition fair Lymphatic: No cervical or supraclavicular adenopathy Lungs no rales or rhonchi, good excursion bilaterally Heart regular rate and rhythm, no murmur appreciated Abd soft, nontender, positive bowel sounds, no liver or spleen tip palpated on exam, no fluid wave  MSK no focal spinal tenderness, no joint edema Neuro: non-focal, well-oriented, appropriate affect Breasts: Deferred   Lab Results  Component Value Date   WBC 9.3 05/30/2019   HGB 11.3 (L) 05/30/2019   HCT 33.6 (L) 05/30/2019   MCV 101.8 (H) 05/30/2019   PLT 364 05/30/2019   Lab Results  Component Value Date   FERRITIN 206 04/22/2019   IRON 55 04/22/2019   TIBC 168 (L) 04/22/2019   UIBC 113 (L) 04/22/2019   IRONPCTSAT 33 04/22/2019   Lab Results  Component Value Date   RETICCTPCT 1.4 02/17/2019   RBC 3.30 (L) 05/30/2019   Lab Results  Component Value  Date   KPAFRELGTCHN 60.2 (H) 04/22/2019   LAMBDASER 41.7 (H) 04/22/2019   KAPLAMBRATIO 1.44 04/22/2019   Lab Results  Component Value Date   IGGSERUM 2,260 (H) 04/22/2019   IGA 336 04/22/2019   IGMSERUM 90 04/22/2019   Lab Results  Component Value Date   TOTALPROTELP 7.3 04/22/2019   ALBUMINELP 3.3 04/22/2019   A1GS 0.3 04/22/2019   A2GS 0.7 04/22/2019   BETS 0.9 04/22/2019   GAMS 2.1 (H) 04/22/2019   MSPIKE 1.2 (H) 04/22/2019   SPEI Comment 04/22/2019     Chemistry      Component Value Date/Time   NA 135 05/30/2019 1341   K 4.0 05/30/2019 1341   CL  100 05/30/2019 1341   CO2 26 05/30/2019 1341   BUN 15 05/30/2019 1341   CREATININE 1.15 (H) 05/30/2019 1341   CREATININE 1.19 (H) 05/07/2012 1553      Component Value Date/Time   CALCIUM 9.5 05/30/2019 1341   ALKPHOS 71 05/30/2019 1341   AST 15 05/30/2019 1341   ALT 7 05/30/2019 1341   BILITOT 0.4 05/30/2019 1341       Impression and Plan: Mallory Murphy is a very pleasant 74 yo caucasian female with multifactorial anemia and MGUS possibly reactive due to RA.  Her Hgb is stable at 11.3, no Aranesp needed this visit.  We will see what her iron studies look like and replace if needed.  We will plan to see her back in another months.  She will contact our office with any questions or concerns. We can certainly see her sooner if needed.   Laverna Peace, NP 2/15/20212:30 PM

## 2019-05-31 LAB — IRON AND TIBC
Iron: 62 ug/dL (ref 41–142)
Saturation Ratios: 34 % (ref 21–57)
TIBC: 184 ug/dL — ABNORMAL LOW (ref 236–444)
UIBC: 122 ug/dL (ref 120–384)

## 2019-05-31 LAB — FERRITIN: Ferritin: 312 ng/mL — ABNORMAL HIGH (ref 11–307)

## 2019-06-28 ENCOUNTER — Inpatient Hospital Stay: Payer: Medicare Other | Admitting: Hematology & Oncology

## 2019-06-28 ENCOUNTER — Inpatient Hospital Stay: Payer: Medicare Other

## 2019-07-28 ENCOUNTER — Other Ambulatory Visit: Payer: Self-pay

## 2019-07-28 ENCOUNTER — Inpatient Hospital Stay: Payer: Medicare Other | Attending: Hematology & Oncology

## 2019-07-28 ENCOUNTER — Inpatient Hospital Stay (HOSPITAL_BASED_OUTPATIENT_CLINIC_OR_DEPARTMENT_OTHER): Payer: Medicare Other | Admitting: Hematology & Oncology

## 2019-07-28 ENCOUNTER — Inpatient Hospital Stay: Payer: Medicare Other

## 2019-07-28 ENCOUNTER — Encounter: Payer: Self-pay | Admitting: Hematology & Oncology

## 2019-07-28 VITALS — BP 117/43 | HR 99 | Temp 98.2°F | Resp 20 | Wt 131.0 lb

## 2019-07-28 DIAGNOSIS — M05711 Rheumatoid arthritis with rheumatoid factor of right shoulder without organ or systems involvement: Secondary | ICD-10-CM | POA: Diagnosis not present

## 2019-07-28 DIAGNOSIS — D631 Anemia in chronic kidney disease: Secondary | ICD-10-CM | POA: Diagnosis present

## 2019-07-28 DIAGNOSIS — D472 Monoclonal gammopathy: Secondary | ICD-10-CM | POA: Diagnosis not present

## 2019-07-28 DIAGNOSIS — M069 Rheumatoid arthritis, unspecified: Secondary | ICD-10-CM | POA: Diagnosis not present

## 2019-07-28 DIAGNOSIS — N189 Chronic kidney disease, unspecified: Secondary | ICD-10-CM | POA: Diagnosis not present

## 2019-07-28 DIAGNOSIS — D500 Iron deficiency anemia secondary to blood loss (chronic): Secondary | ICD-10-CM

## 2019-07-28 DIAGNOSIS — E611 Iron deficiency: Secondary | ICD-10-CM | POA: Insufficient documentation

## 2019-07-28 LAB — CMP (CANCER CENTER ONLY)
ALT: 7 U/L (ref 0–44)
AST: 13 U/L — ABNORMAL LOW (ref 15–41)
Albumin: 3.7 g/dL (ref 3.5–5.0)
Alkaline Phosphatase: 71 U/L (ref 38–126)
Anion gap: 7 (ref 5–15)
BUN: 17 mg/dL (ref 8–23)
CO2: 26 mmol/L (ref 22–32)
Calcium: 8.9 mg/dL (ref 8.9–10.3)
Chloride: 104 mmol/L (ref 98–111)
Creatinine: 1.07 mg/dL — ABNORMAL HIGH (ref 0.44–1.00)
GFR, Est AFR Am: 60 mL/min — ABNORMAL LOW (ref >60)
GFR, Estimated: 51 mL/min — ABNORMAL LOW (ref >60)
Glucose, Bld: 106 mg/dL — ABNORMAL HIGH (ref 70–99)
Potassium: 4 mmol/L (ref 3.5–5.1)
Sodium: 137 mmol/L (ref 135–145)
Total Bilirubin: 0.4 mg/dL (ref 0.3–1.2)
Total Protein: 7.2 g/dL (ref 6.5–8.1)

## 2019-07-28 LAB — CBC WITH DIFFERENTIAL (CANCER CENTER ONLY)
Abs Immature Granulocytes: 0.17 10*3/uL — ABNORMAL HIGH (ref 0.00–0.07)
Basophils Absolute: 0 10*3/uL (ref 0.0–0.1)
Basophils Relative: 0 %
Eosinophils Absolute: 0.4 10*3/uL (ref 0.0–0.5)
Eosinophils Relative: 4 %
HCT: 29.6 % — ABNORMAL LOW (ref 36.0–46.0)
Hemoglobin: 9.9 g/dL — ABNORMAL LOW (ref 12.0–15.0)
Immature Granulocytes: 2 %
Lymphocytes Relative: 31 %
Lymphs Abs: 3.6 10*3/uL (ref 0.7–4.0)
MCH: 34.9 pg — ABNORMAL HIGH (ref 26.0–34.0)
MCHC: 33.4 g/dL (ref 30.0–36.0)
MCV: 104.2 fL — ABNORMAL HIGH (ref 80.0–100.0)
Monocytes Absolute: 1.3 10*3/uL — ABNORMAL HIGH (ref 0.1–1.0)
Monocytes Relative: 11 %
Neutro Abs: 6.1 10*3/uL (ref 1.7–7.7)
Neutrophils Relative %: 52 %
Platelet Count: 353 10*3/uL (ref 150–400)
RBC: 2.84 MIL/uL — ABNORMAL LOW (ref 3.87–5.11)
RDW: 17.9 % — ABNORMAL HIGH (ref 11.5–15.5)
WBC Count: 11.7 10*3/uL — ABNORMAL HIGH (ref 4.0–10.5)
nRBC: 0 % (ref 0.0–0.2)

## 2019-07-28 LAB — RETICULOCYTES
Immature Retic Fract: 26.3 % — ABNORMAL HIGH (ref 2.3–15.9)
RBC.: 2.82 MIL/uL — ABNORMAL LOW (ref 3.87–5.11)
Retic Count, Absolute: 95.6 10*3/uL (ref 19.0–186.0)
Retic Ct Pct: 3.4 % — ABNORMAL HIGH (ref 0.4–3.1)

## 2019-07-28 MED ORDER — DARBEPOETIN ALFA 300 MCG/0.6ML IJ SOSY
300.0000 ug | PREFILLED_SYRINGE | Freq: Once | INTRAMUSCULAR | Status: AC
  Administered 2019-07-28: 300 ug via SUBCUTANEOUS

## 2019-07-28 MED ORDER — DARBEPOETIN ALFA 300 MCG/0.6ML IJ SOSY
PREFILLED_SYRINGE | INTRAMUSCULAR | Status: AC
  Filled 2019-07-28: qty 0.6

## 2019-07-28 NOTE — Progress Notes (Signed)
Hematology and Oncology Follow Up Visit  Mallory Murphy [id number] [birth year] 74 y.o. 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  Overall, she is doing okay.  Her hemoglobin is down a little bit today.  She has not gotten Aranesp probably for a good 3 months.  She does have rheumatoid arthritis pretty badly.  She did get a steroid injection to her left ankle a week ago.  She has had no problems with the coronavirus.  She has had her vaccinations.  She has had no problems with nausea or vomiting.  There has been no change in bowel or bladder habits.  She has had no bleeding.  When we last saw her back in February, her ferritin was 312 with an iron saturation of 34%.  She does have an IgG kappa MGUS.  Back in January, her M spike was 1.2 g/dL.  Her IgG level was 2260 mg/dL.  Her kappa light chain was 6 mg/dL.  Currently, her performance status is ECOG 2.   Medications:  Allergies as of 07/28/2019      Reactions   Macrobid [nitrofurantoin Macrocrystal] Nausea Only      Medication List       Accurate as of July 28, 2019  2:41 PM. If you have any questions, ask your nurse or doctor.        chlorhexidine 0.12 % solution Commonly known as: PERIDEX chlorhexidine gluconate 0.12 % mouthwash  RM WITH 15 ML BID AND EXPECTORATE   ciclopirox 8 % solution Commonly known as: PENLAC ciclopirox 8 % topical solution  APPLY TO THE AFFECTED AREA DAILY. REMOVE WEEKLY WITH NAIL POLISH REMOVER AND REAPPLY.   diphenhydrAMINE 25 mg capsule Commonly known as: BENADRYL Take 25 mg by mouth at bedtime as needed.   docusate sodium 100 MG capsule Commonly known as: COLACE Take 100 mg by mouth daily as needed for mild constipation.   escitalopram 10 MG tablet Commonly  known as: LEXAPRO Take 10 mg by mouth daily.   losartan 100 MG tablet Commonly known as: COZAAR 100 mg daily.   oxybutynin 5 MG 24 hr tablet Commonly known as: DITROPAN-XL Take 5 mg by mouth at bedtime.   [place] ARIA IV Inject into the vein every 8 (eight) weeks. For RA [facility] Rhematology.   traMADol 50 MG tablet Commonly known as: ULTRAM Take 1-2 tablets (50-100 mg total) by mouth every 6 (six) hours as needed for moderate pain.       Allergies:  Allergies  Allergen Reactions  . Macrobid [Nitrofurantoin Macrocrystal] Nausea Only    Past Medical History, Surgical history, Social history, and Family History were reviewed and updated.  Review of Systems: Review of Systems  Constitutional: Negative.   HENT: Negative.   Eyes: Negative.   Respiratory: Negative.   Cardiovascular: Negative.   Gastrointestinal: Negative.   Genitourinary: Negative.   Musculoskeletal: Positive for joint pain, myalgias and neck pain.  Skin: Negative.   Neurological: Positive for focal weakness.  Endo/Heme/Allergies: Negative.   Psychiatric/Behavioral: Negative.       Physical Exam:  weight is 131 lb (59.4 kg). Her temporal temperature is 98.2 F (36.8 C). Her blood pressure is 117/43 (abnormal) and her pulse is 99. Her respiration is 20 and oxygen saturation is 100%.  Wt Readings from Last 3 Encounters:  07/28/19 131 lb (59.4 kg)  05/30/19 126 lb (57.2 kg)  04/22/19 128 lb 12.8 oz (58.4 kg)    Physical Exam Vitals reviewed.  HENT:     Head: Normocephalic and atraumatic.  Eyes:     Pupils: Pupils are equal, round, and reactive to light.  Cardiovascular:     Rate and Rhythm: Normal rate and regular rhythm.     Heart sounds: Normal heart sounds.  Pulmonary:     Effort: Pulmonary effort is normal.     Breath sounds: Normal breath sounds.  Abdominal:     General: Bowel sounds are normal.     Palpations: Abdomen is soft.  Musculoskeletal:        General: No tenderness  or deformity. Normal range of motion.     Cervical back: Normal range of motion.     Comments: Extremities shows significant changes secondary to rheumatoid arthritis.  She has limited range of motion of quite a few joints.  She has some joint swelling.  She has decent strength in her upper and lower extremities.  Lymphadenopathy:     Cervical: No cervical adenopathy.  Skin:    General: Skin is warm and dry.     Findings: No erythema or rash.  Neurological:     Mental Status: She is alert and oriented to person, place, and time.  Psychiatric:        Behavior: Behavior normal.        Thought Content: Thought content normal.        Judgment: Judgment normal.      Lab Results  Component Value Date   WBC 11.7 (H) 07/28/2019   HGB 9.9 (L) 07/28/2019   HCT 29.6 (L) 07/28/2019   MCV 104.2 (H) 07/28/2019   PLT 353 07/28/2019   Lab Results  Component Value Date   FERRITIN 312 (H) 05/30/2019   IRON 62 05/30/2019   TIBC 184 (L) 05/30/2019   UIBC 122 05/30/2019   IRONPCTSAT 34 05/30/2019   Lab Results  Component Value Date   RETICCTPCT 3.4 (H) 07/28/2019   RBC 2.82 (L) 07/28/2019   Lab Results  Component Value Date   KPAFRELGTCHN 60.2 (H) 04/22/2019   LAMBDASER 41.7 (H) 04/22/2019   KAPLAMBRATIO 1.44 04/22/2019   Lab Results  Component Value Date   IGGSERUM 2,260 (H) 04/22/2019   IGA 336 04/22/2019   IGMSERUM 90 04/22/2019   Lab Results  Component Value Date   TOTALPROTELP 7.3 04/22/2019   ALBUMINELP 3.3 04/22/2019   A1GS 0.3 04/22/2019   A2GS 0.7 04/22/2019   BETS 0.9 04/22/2019   GAMS 2.1 (H) 04/22/2019   MSPIKE 1.2 (H) 04/22/2019   SPEI Comment 04/22/2019     Chemistry      Component Value Date/Time   NA 135 05/30/2019 1341   K 4.0 05/30/2019 1341   CL 100 05/30/2019 1341   CO2 26 05/30/2019 1341   BUN 15 05/30/2019 1341   CREATININE 1.15 (H) 05/30/2019 1341   CREATININE 1.19 (H) 05/07/2012 1553      Component Value Date/Time   CALCIUM 9.5 05/30/2019  1341   ALKPHOS 71 05/30/2019 1341   AST 15 05/30/2019 1341   ALT 7 05/30/2019 1341   BILITOT 0.4 05/30/2019 1341       Impression and Plan: Mallory Murphy is a very pleasant 74 yo caucasian female with multifactorial anemia.  We will go ahead and give her Aranesp today.  We are going to  have to check the MGUS.  I guess she would be considered at a significant risk for a plasma cell disorder given the bad rheumatoid arthritis.  I just feel bad that she has the rheumatoid arthritis that is affecting her joints.   Volanda Napoleon, MD 4/15/20212:41 PM

## 2019-07-28 NOTE — Patient Instructions (Signed)
Darbepoetin Alfa injection What is this medicine? DARBEPOETIN ALFA (dar be POE e tin AL fa) helps your body make more red blood cells. It is used to treat anemia caused by chronic kidney failure and chemotherapy. This medicine may be used for other purposes; ask your health care provider or pharmacist if you have questions. COMMON BRAND NAME(S): Aranesp What should I tell my health care provider before I take this medicine? They need to know if you have any of these conditions:  blood clotting disorders or history of blood clots  cancer patient not on chemotherapy  cystic fibrosis  heart disease, such as angina, heart failure, or a history of a heart attack  hemoglobin level of 12 g/dL or greater  high blood pressure  low levels of folate, iron, or vitamin B12  seizures  an unusual or allergic reaction to darbepoetin, erythropoietin, albumin, hamster proteins, latex, other medicines, foods, dyes, or preservatives  pregnant or trying to get pregnant  breast-feeding How should I use this medicine? This medicine is for injection into a vein or under the skin. It is usually given by a health care professional in a hospital or clinic setting. If you get this medicine at home, you will be taught how to prepare and give this medicine. Use exactly as directed. Take your medicine at regular intervals. Do not take your medicine more often than directed. It is important that you put your used needles and syringes in a special sharps container. Do not put them in a trash can. If you do not have a sharps container, call your pharmacist or healthcare provider to get one. A special MedGuide will be given to you by the pharmacist with each prescription and refill. Be sure to read this information carefully each time. Talk to your pediatrician regarding the use of this medicine in children. While this medicine may be used in children as young as 1 month of age for selected conditions, precautions do  apply. Overdosage: If you think you have taken too much of this medicine contact a poison control center or emergency room at once. NOTE: This medicine is only for you. Do not share this medicine with others. What if I miss a dose? If you miss a dose, take it as soon as you can. If it is almost time for your next dose, take only that dose. Do not take double or extra doses. What may interact with this medicine? Do not take this medicine with any of the following medications:  epoetin alfa This list may not describe all possible interactions. Give your health care provider a list of all the medicines, herbs, non-prescription drugs, or dietary supplements you use. Also tell them if you smoke, drink alcohol, or use illegal drugs. Some items may interact with your medicine. What should I watch for while using this medicine? Your condition will be monitored carefully while you are receiving this medicine. You may need blood work done while you are taking this medicine. This medicine may cause a decrease in vitamin B6. You should make sure that you get enough vitamin B6 while you are taking this medicine. Discuss the foods you eat and the vitamins you take with your health care professional. What side effects may I notice from receiving this medicine? Side effects that you should report to your doctor or health care professional as soon as possible:  allergic reactions like skin rash, itching or hives, swelling of the face, lips, or tongue  breathing problems  changes in   vision  chest pain  confusion, trouble speaking or understanding  feeling faint or lightheaded, falls  high blood pressure  muscle aches or pains  pain, swelling, warmth in the leg  rapid weight gain  severe headaches  sudden numbness or weakness of the face, arm or leg  trouble walking, dizziness, loss of balance or coordination  seizures (convulsions)  swelling of the ankles, feet, hands  unusually weak or  tired Side effects that usually do not require medical attention (report to your doctor or health care professional if they continue or are bothersome):  diarrhea  fever, chills (flu-like symptoms)  headaches  nausea, vomiting  redness, stinging, or swelling at site where injected This list may not describe all possible side effects. Call your doctor for medical advice about side effects. You may report side effects to FDA at 1-800-FDA-1088. Where should I keep my medicine? Keep out of the reach of children. Store in a refrigerator between 2 and 8 degrees C (36 and 46 degrees F). Do not freeze. Do not shake. Throw away any unused portion if using a single-dose vial. Throw away any unused medicine after the expiration date. NOTE: This sheet is a summary. It may not cover all possible information. If you have questions about this medicine, talk to your doctor, pharmacist, or health care provider.  2020 Elsevier/Gold Standard (2017-04-15 16:44:20)  

## 2019-07-29 LAB — IRON AND TIBC
Iron: 77 ug/dL (ref 41–142)
Saturation Ratios: 38 % (ref 21–57)
TIBC: 204 ug/dL — ABNORMAL LOW (ref 236–444)
UIBC: 127 ug/dL (ref 120–384)

## 2019-07-29 LAB — FERRITIN: Ferritin: 267 ng/mL (ref 11–307)

## 2019-08-24 ENCOUNTER — Inpatient Hospital Stay: Payer: Medicare Other | Attending: Hematology & Oncology

## 2019-08-24 ENCOUNTER — Telehealth: Payer: Self-pay | Admitting: Family

## 2019-08-24 ENCOUNTER — Inpatient Hospital Stay (HOSPITAL_BASED_OUTPATIENT_CLINIC_OR_DEPARTMENT_OTHER): Payer: Medicare Other | Admitting: Family

## 2019-08-24 ENCOUNTER — Inpatient Hospital Stay: Payer: Medicare Other

## 2019-08-24 ENCOUNTER — Encounter: Payer: Self-pay | Admitting: Family

## 2019-08-24 ENCOUNTER — Other Ambulatory Visit: Payer: Self-pay

## 2019-08-24 VITALS — BP 142/68 | HR 88 | Temp 97.3°F | Resp 18 | Ht 61.0 in | Wt 132.0 lb

## 2019-08-24 DIAGNOSIS — D631 Anemia in chronic kidney disease: Secondary | ICD-10-CM

## 2019-08-24 DIAGNOSIS — D500 Iron deficiency anemia secondary to blood loss (chronic): Secondary | ICD-10-CM | POA: Diagnosis not present

## 2019-08-24 DIAGNOSIS — D472 Monoclonal gammopathy: Secondary | ICD-10-CM | POA: Insufficient documentation

## 2019-08-24 DIAGNOSIS — N189 Chronic kidney disease, unspecified: Secondary | ICD-10-CM | POA: Insufficient documentation

## 2019-08-24 DIAGNOSIS — M069 Rheumatoid arthritis, unspecified: Secondary | ICD-10-CM | POA: Insufficient documentation

## 2019-08-24 DIAGNOSIS — M05711 Rheumatoid arthritis with rheumatoid factor of right shoulder without organ or systems involvement: Secondary | ICD-10-CM

## 2019-08-24 LAB — CMP (CANCER CENTER ONLY)
ALT: 9 U/L (ref 0–44)
AST: 17 U/L (ref 15–41)
Albumin: 3.9 g/dL (ref 3.5–5.0)
Alkaline Phosphatase: 82 U/L (ref 38–126)
Anion gap: 9 (ref 5–15)
BUN: 12 mg/dL (ref 8–23)
CO2: 27 mmol/L (ref 22–32)
Calcium: 9.4 mg/dL (ref 8.9–10.3)
Chloride: 101 mmol/L (ref 98–111)
Creatinine: 1.03 mg/dL — ABNORMAL HIGH (ref 0.44–1.00)
GFR, Est AFR Am: >60 mL/min (ref >60)
GFR, Estimated: 54 mL/min — ABNORMAL LOW (ref >60)
Glucose, Bld: 130 mg/dL — ABNORMAL HIGH (ref 70–99)
Potassium: 3.6 mmol/L (ref 3.5–5.1)
Sodium: 137 mmol/L (ref 135–145)
Total Bilirubin: 0.4 mg/dL (ref 0.3–1.2)
Total Protein: 7.4 g/dL (ref 6.5–8.1)

## 2019-08-24 LAB — CBC WITH DIFFERENTIAL (CANCER CENTER ONLY)
Abs Immature Granulocytes: 0.08 10*3/uL — ABNORMAL HIGH (ref 0.00–0.07)
Basophils Absolute: 0 10*3/uL (ref 0.0–0.1)
Basophils Relative: 1 %
Eosinophils Absolute: 0.3 10*3/uL (ref 0.0–0.5)
Eosinophils Relative: 4 %
HCT: 35.1 % — ABNORMAL LOW (ref 36.0–46.0)
Hemoglobin: 11.5 g/dL — ABNORMAL LOW (ref 12.0–15.0)
Immature Granulocytes: 1 %
Lymphocytes Relative: 32 %
Lymphs Abs: 2.3 10*3/uL (ref 0.7–4.0)
MCH: 35.8 pg — ABNORMAL HIGH (ref 26.0–34.0)
MCHC: 32.8 g/dL (ref 30.0–36.0)
MCV: 109.3 fL — ABNORMAL HIGH (ref 80.0–100.0)
Monocytes Absolute: 0.6 10*3/uL (ref 0.1–1.0)
Monocytes Relative: 9 %
Neutro Abs: 3.8 10*3/uL (ref 1.7–7.7)
Neutrophils Relative %: 53 %
Platelet Count: 333 10*3/uL (ref 150–400)
RBC: 3.21 MIL/uL — ABNORMAL LOW (ref 3.87–5.11)
RDW: 14.6 % (ref 11.5–15.5)
WBC Count: 7.1 10*3/uL (ref 4.0–10.5)
nRBC: 0 % (ref 0.0–0.2)

## 2019-08-24 LAB — RETICULOCYTES
Immature Retic Fract: 11.4 % (ref 2.3–15.9)
RBC.: 3.12 MIL/uL — ABNORMAL LOW (ref 3.87–5.11)
Retic Count, Absolute: 27.5 10*3/uL (ref 19.0–186.0)
Retic Ct Pct: 0.9 % (ref 0.4–3.1)

## 2019-08-24 NOTE — Telephone Encounter (Signed)
Appointments scheduled calendar printed & mailed per 5/12 los

## 2019-08-24 NOTE — Progress Notes (Signed)
Hematology and Oncology Follow Up Visit  MILLIANI JORY [id number] May 05, 1945 74 y.o. 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 Kost is here today for follow-up. She states that she is feeling good right now.  Hgb is up to 11.5, MCV 109.  January M-spike was 1.2, IgG level 2,260 mg/dLand kappa light chains 6.02 mg/dL.  No episodes of bleeding. No bruising or petechiae.  No fever, chills, n/v, cough, rash, dizziness, SOB, chest pain, palpitations, abdominal pain or changes in bowel or bladder habits.  She has generalized joint swelling and aches/pains secondary to arthritis.  No falls or syncopal episodes.  She has maintained a good appetite and is staying well hydrated. Her weight is stable.   ECOG Performance Status: 1 - Symptomatic but completely ambulatory  Medications:  Allergies as of 08/24/2019      Reactions   Macrobid [nitrofurantoin Macrocrystal] Nausea Only      Medication List       Accurate as of Aug 24, 2019  2:25 PM. If you have any questions, ask your nurse or doctor.        chlorhexidine 0.12 % solution Commonly known as: PERIDEX chlorhexidine gluconate 0.12 % mouthwash  RM WITH 15 ML BID AND EXPECTORATE   ciclopirox 8 % solution Commonly known as: PENLAC ciclopirox 8 % topical solution  APPLY TO THE AFFECTED AREA DAILY. REMOVE WEEKLY WITH NAIL POLISH REMOVER AND REAPPLY.   diphenhydrAMINE 25 mg capsule Commonly known as: BENADRYL Take 25 mg by mouth at bedtime as needed.   docusate sodium 100 MG capsule Commonly known as: COLACE Take 100 mg by mouth daily as needed for mild constipation.   escitalopram 10 MG tablet Commonly known as: LEXAPRO Take 10 mg by mouth daily.   losartan 100 MG tablet Commonly known as: COZAAR 100 mg daily.   oxybutynin  5 MG 24 hr tablet Commonly known as: DITROPAN-XL Take 5 mg by mouth at bedtime.   [place] ARIA IV Inject into the vein every 8 (eight) weeks. For RA [facility] Rhematology.   traMADol 50 MG tablet Commonly known as: ULTRAM Take 1-2 tablets (50-100 mg total) by mouth every 6 (six) hours as needed for moderate pain.       Allergies:  Allergies  Allergen Reactions  . Macrobid [Nitrofurantoin Macrocrystal] Nausea Only    Past Medical History, Surgical history, Social history, and Family History were reviewed and updated.  Review of Systems: All other 10 point review of systems is negative.   Physical Exam:  height is 5\' 1"  (1.549 m) and weight is 132 lb (59.9 kg). Her temporal temperature is 97.3 F (36.3 C) (abnormal). Her blood pressure is 142/68 (abnormal) and her pulse is 88. Her respiration is 18 and oxygen saturation is 99%.   Wt Readings from Last 3 Encounters:  08/24/19 132 lb (59.9 kg)  07/28/19 131 lb (59.4 kg)  05/30/19 126 lb (57.2 kg)    Ocular: Sclerae unicteric, pupils equal, round and reactive to light Ear-nose-throat: Oropharynx clear, dentition fair Lymphatic: No cervical or supraclavicular adenopathy Lungs no rales or rhonchi, good excursion bilaterally Heart regular rate and rhythm, no murmur appreciated Abd soft, nontender, positive bowel sounds, no liver or spleen tip palpated on exam, no fluid wave  MSK no focal spinal tenderness,  no joint edema Neuro: non-focal, well-oriented, appropriate affect Breasts: Deferred   Lab Results  Component Value Date   WBC 7.1 08/24/2019   HGB 11.5 (L) 08/24/2019   HCT 35.1 (L) 08/24/2019   MCV 109.3 (H) 08/24/2019   PLT 333 08/24/2019   Lab Results  Component Value Date   FERRITIN 267 07/28/2019   IRON 77 07/28/2019   TIBC 204 (L) 07/28/2019   UIBC 127 07/28/2019   IRONPCTSAT 38 07/28/2019   Lab Results  Component Value Date   RETICCTPCT 0.9 08/24/2019   RBC 3.12 (L) 08/24/2019   Lab Results    Component Value Date   KPAFRELGTCHN 60.2 (H) 04/22/2019   LAMBDASER 41.7 (H) 04/22/2019   KAPLAMBRATIO 1.44 04/22/2019   Lab Results  Component Value Date   IGGSERUM 2,260 (H) 04/22/2019   IGA 336 04/22/2019   IGMSERUM 90 04/22/2019   Lab Results  Component Value Date   TOTALPROTELP 7.3 04/22/2019   ALBUMINELP 3.3 04/22/2019   A1GS 0.3 04/22/2019   A2GS 0.7 04/22/2019   BETS 0.9 04/22/2019   GAMS 2.1 (H) 04/22/2019   MSPIKE 1.2 (H) 04/22/2019   SPEI Comment 04/22/2019     Chemistry      Component Value Date/Time   NA 137 07/28/2019 1413   K 4.0 07/28/2019 1413   CL 104 07/28/2019 1413   CO2 26 07/28/2019 1413   BUN 17 07/28/2019 1413   CREATININE 1.07 (H) 07/28/2019 1413   CREATININE 1.19 (H) 05/07/2012 1553      Component Value Date/Time   CALCIUM 8.9 07/28/2019 1413   ALKPHOS 71 07/28/2019 1413   AST 13 (L) 07/28/2019 1413   ALT 7 07/28/2019 1413   BILITOT 0.4 07/28/2019 1413       Impression and Plan: Ms. Gallant is a very pleasant 74 yo caucasian female with multifactorial anemia as well as IgG kappa MGUS.  Protein studies are pending.  No Aranesp needed this visit, Hgb 11.5.  We will see what her iron studies look like and replace if needed.  We will see her again in 3 weeks for lab and injection and follow-up in 6 weeks.   Laverna Peace, NP 5/12/20212:25 PM

## 2019-08-25 LAB — IRON AND TIBC
Iron: 62 ug/dL (ref 41–142)
Saturation Ratios: 29 % (ref 21–57)
TIBC: 211 ug/dL — ABNORMAL LOW (ref 236–444)
UIBC: 150 ug/dL (ref 120–384)

## 2019-08-25 LAB — IGG, IGA, IGM
IgA: 357 mg/dL (ref 64–422)
IgG (Immunoglobin G), Serum: 1977 mg/dL — ABNORMAL HIGH (ref 586–1602)
IgM (Immunoglobulin M), Srm: 100 mg/dL (ref 26–217)

## 2019-08-25 LAB — KAPPA/LAMBDA LIGHT CHAINS
Kappa free light chain: 56.3 mg/L — ABNORMAL HIGH (ref 3.3–19.4)
Kappa, lambda light chain ratio: 1.3 (ref 0.26–1.65)
Lambda free light chains: 43.4 mg/L — ABNORMAL HIGH (ref 5.7–26.3)

## 2019-08-25 LAB — FERRITIN: Ferritin: 233 ng/mL (ref 11–307)

## 2019-08-26 LAB — PROTEIN ELECTROPHORESIS, SERUM, WITH REFLEX
A/G Ratio: 0.9 (ref 0.7–1.7)
Albumin ELP: 3.5 g/dL (ref 2.9–4.4)
Alpha-1-Globulin: 0.3 g/dL (ref 0.0–0.4)
Alpha-2-Globulin: 0.8 g/dL (ref 0.4–1.0)
Beta Globulin: 1 g/dL (ref 0.7–1.3)
Gamma Globulin: 2 g/dL — ABNORMAL HIGH (ref 0.4–1.8)
Globulin, Total: 4 g/dL — ABNORMAL HIGH (ref 2.2–3.9)
M-Spike, %: 1 g/dL — ABNORMAL HIGH
SPEP Interpretation: 0
Total Protein ELP: 7.5 g/dL (ref 6.0–8.5)

## 2019-08-26 LAB — IMMUNOFIXATION REFLEX, SERUM
IgA: 386 mg/dL (ref 64–422)
IgG (Immunoglobin G), Serum: 2201 mg/dL — ABNORMAL HIGH (ref 586–1602)
IgM (Immunoglobulin M), Srm: 109 mg/dL (ref 26–217)

## 2019-09-14 ENCOUNTER — Inpatient Hospital Stay: Payer: Medicare Other | Attending: Hematology & Oncology

## 2019-09-14 ENCOUNTER — Inpatient Hospital Stay: Payer: Medicare Other

## 2019-09-14 ENCOUNTER — Other Ambulatory Visit: Payer: Self-pay

## 2019-09-14 DIAGNOSIS — N189 Chronic kidney disease, unspecified: Secondary | ICD-10-CM | POA: Insufficient documentation

## 2019-09-14 DIAGNOSIS — D631 Anemia in chronic kidney disease: Secondary | ICD-10-CM | POA: Diagnosis present

## 2019-09-14 DIAGNOSIS — D500 Iron deficiency anemia secondary to blood loss (chronic): Secondary | ICD-10-CM

## 2019-09-14 LAB — CMP (CANCER CENTER ONLY)
ALT: 6 U/L (ref 0–44)
AST: 14 U/L — ABNORMAL LOW (ref 15–41)
Albumin: 3.7 g/dL (ref 3.5–5.0)
Alkaline Phosphatase: 79 U/L (ref 38–126)
Anion gap: 7 (ref 5–15)
BUN: 12 mg/dL (ref 8–23)
CO2: 28 mmol/L (ref 22–32)
Calcium: 9.2 mg/dL (ref 8.9–10.3)
Chloride: 100 mmol/L (ref 98–111)
Creatinine: 1.05 mg/dL — ABNORMAL HIGH (ref 0.44–1.00)
GFR, Est AFR Am: >60 mL/min (ref >60)
GFR, Estimated: 53 mL/min — ABNORMAL LOW (ref >60)
Glucose, Bld: 111 mg/dL — ABNORMAL HIGH (ref 70–99)
Potassium: 3.9 mmol/L (ref 3.5–5.1)
Sodium: 135 mmol/L (ref 135–145)
Total Bilirubin: 0.3 mg/dL (ref 0.3–1.2)
Total Protein: 8.1 g/dL (ref 6.5–8.1)

## 2019-09-14 LAB — CBC WITH DIFFERENTIAL (CANCER CENTER ONLY)
Abs Immature Granulocytes: 0.02 10*3/uL (ref 0.00–0.07)
Basophils Absolute: 0 10*3/uL (ref 0.0–0.1)
Basophils Relative: 0 %
Eosinophils Absolute: 0.4 10*3/uL (ref 0.0–0.5)
Eosinophils Relative: 4 %
HCT: 32.3 % — ABNORMAL LOW (ref 36.0–46.0)
Hemoglobin: 11 g/dL — ABNORMAL LOW (ref 12.0–15.0)
Immature Granulocytes: 0 %
Lymphocytes Relative: 34 %
Lymphs Abs: 3.1 10*3/uL (ref 0.7–4.0)
MCH: 36.8 pg — ABNORMAL HIGH (ref 26.0–34.0)
MCHC: 34.1 g/dL (ref 30.0–36.0)
MCV: 108 fL — ABNORMAL HIGH (ref 80.0–100.0)
Monocytes Absolute: 1 10*3/uL (ref 0.1–1.0)
Monocytes Relative: 11 %
Neutro Abs: 4.7 10*3/uL (ref 1.7–7.7)
Neutrophils Relative %: 51 %
Platelet Count: 306 10*3/uL (ref 150–400)
RBC: 2.99 MIL/uL — ABNORMAL LOW (ref 3.87–5.11)
RDW: 12.9 % (ref 11.5–15.5)
WBC Count: 9.2 10*3/uL (ref 4.0–10.5)
nRBC: 0 % (ref 0.0–0.2)

## 2019-10-05 ENCOUNTER — Inpatient Hospital Stay: Payer: Medicare Other

## 2019-10-05 ENCOUNTER — Encounter: Payer: Self-pay | Admitting: Family

## 2019-10-05 ENCOUNTER — Inpatient Hospital Stay (HOSPITAL_BASED_OUTPATIENT_CLINIC_OR_DEPARTMENT_OTHER): Payer: Medicare Other | Admitting: Family

## 2019-10-05 ENCOUNTER — Other Ambulatory Visit: Payer: Self-pay

## 2019-10-05 VITALS — BP 133/69 | HR 93 | Temp 98.9°F | Resp 20 | Wt 136.0 lb

## 2019-10-05 DIAGNOSIS — D631 Anemia in chronic kidney disease: Secondary | ICD-10-CM | POA: Diagnosis not present

## 2019-10-05 DIAGNOSIS — D500 Iron deficiency anemia secondary to blood loss (chronic): Secondary | ICD-10-CM | POA: Diagnosis not present

## 2019-10-05 DIAGNOSIS — N189 Chronic kidney disease, unspecified: Secondary | ICD-10-CM | POA: Diagnosis not present

## 2019-10-05 LAB — RETICULOCYTES
Immature Retic Fract: 15.3 % (ref 2.3–15.9)
RBC.: 2.88 MIL/uL — ABNORMAL LOW (ref 3.87–5.11)
Retic Count, Absolute: 42.3 10*3/uL (ref 19.0–186.0)
Retic Ct Pct: 1.5 % (ref 0.4–3.1)

## 2019-10-05 LAB — CBC WITH DIFFERENTIAL (CANCER CENTER ONLY)
Abs Immature Granulocytes: 0.02 10*3/uL (ref 0.00–0.07)
Basophils Absolute: 0 10*3/uL (ref 0.0–0.1)
Basophils Relative: 0 %
Eosinophils Absolute: 0.4 10*3/uL (ref 0.0–0.5)
Eosinophils Relative: 5 %
HCT: 31 % — ABNORMAL LOW (ref 36.0–46.0)
Hemoglobin: 10.4 g/dL — ABNORMAL LOW (ref 12.0–15.0)
Immature Granulocytes: 0 %
Lymphocytes Relative: 30 %
Lymphs Abs: 2.6 10*3/uL (ref 0.7–4.0)
MCH: 36.1 pg — ABNORMAL HIGH (ref 26.0–34.0)
MCHC: 33.5 g/dL (ref 30.0–36.0)
MCV: 107.6 fL — ABNORMAL HIGH (ref 80.0–100.0)
Monocytes Absolute: 0.9 10*3/uL (ref 0.1–1.0)
Monocytes Relative: 10 %
Neutro Abs: 4.6 10*3/uL (ref 1.7–7.7)
Neutrophils Relative %: 55 %
Platelet Count: 308 10*3/uL (ref 150–400)
RBC: 2.88 MIL/uL — ABNORMAL LOW (ref 3.87–5.11)
RDW: 13.4 % (ref 11.5–15.5)
WBC Count: 8.5 10*3/uL (ref 4.0–10.5)
nRBC: 0 % (ref 0.0–0.2)

## 2019-10-05 LAB — CMP (CANCER CENTER ONLY)
ALT: 8 U/L (ref 0–44)
AST: 18 U/L (ref 15–41)
Albumin: 3.7 g/dL (ref 3.5–5.0)
Alkaline Phosphatase: 77 U/L (ref 38–126)
Anion gap: 8 (ref 5–15)
BUN: 10 mg/dL (ref 8–23)
CO2: 27 mmol/L (ref 22–32)
Calcium: 9.2 mg/dL (ref 8.9–10.3)
Chloride: 103 mmol/L (ref 98–111)
Creatinine: 0.89 mg/dL (ref 0.44–1.00)
GFR, Est AFR Am: >60 mL/min (ref >60)
GFR, Estimated: >60 mL/min (ref >60)
Glucose, Bld: 96 mg/dL (ref 70–99)
Potassium: 3.5 mmol/L (ref 3.5–5.1)
Sodium: 138 mmol/L (ref 135–145)
Total Bilirubin: 0.3 mg/dL (ref 0.3–1.2)
Total Protein: 8 g/dL (ref 6.5–8.1)

## 2019-10-05 MED ORDER — DARBEPOETIN ALFA 300 MCG/0.6ML IJ SOSY
PREFILLED_SYRINGE | INTRAMUSCULAR | Status: AC
  Filled 2019-10-05: qty 0.6

## 2019-10-05 MED ORDER — DARBEPOETIN ALFA 300 MCG/0.6ML IJ SOSY
300.0000 ug | PREFILLED_SYRINGE | Freq: Once | INTRAMUSCULAR | Status: AC
  Administered 2019-10-05: 300 ug via SUBCUTANEOUS

## 2019-10-05 NOTE — Progress Notes (Signed)
Hematology and Oncology Follow Up Visit  Mallory Murphy [id number] Sep 07, 1945 74 y.o. 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  She is doing well and has no complaints at this time.  No episodes of bleeding. No bruising or petechiae.  Hgb is stable at 10.4, MCV 107.  No fever, chills, n/v, cough, rash, dizziness, SOB, chest pain, palpitations, abdominal pain or changes in bowel or bladder habits.  No swelling, tenderness in her extremities.  The intermittent tingling in her hands and feet is stable/unchanged.  No falls or syncope.  She has maintained a good appetite and is staying well hydrated. Her weight is stable.   ECOG Performance Status: 1 - Symptomatic but completely ambulatory  Medications:  Allergies as of 10/05/2019      Reactions   Macrobid [nitrofurantoin Macrocrystal] Nausea Only      Medication List       Accurate as of October 05, 2019  2:18 PM. If you have any questions, ask your nurse or doctor.        chlorhexidine 0.12 % solution Commonly known as: PERIDEX chlorhexidine gluconate 0.12 % mouthwash  RM WITH 15 ML BID AND EXPECTORATE   ciclopirox 8 % solution Commonly known as: PENLAC ciclopirox 8 % topical solution  APPLY TO THE AFFECTED AREA DAILY. REMOVE WEEKLY WITH NAIL POLISH REMOVER AND REAPPLY.   diphenhydrAMINE 25 mg capsule Commonly known as: BENADRYL Take 25 mg by mouth at bedtime as needed.   docusate sodium 100 MG capsule Commonly known as: COLACE Take 100 mg by mouth daily as needed for mild constipation.   escitalopram 10 MG tablet Commonly known as: LEXAPRO Take 10 mg by mouth daily.   losartan 100 MG tablet Commonly known as: COZAAR 100 mg daily.   oxybutynin 5  MG 24 hr tablet Commonly known as: DITROPAN-XL Take 5 mg by mouth at bedtime.   [place] ARIA IV Inject into the vein every 8 (eight) weeks. For RA [facility] Rhematology.   traMADol 50 MG tablet Commonly known as: ULTRAM Take 1-2 tablets (50-100 mg total) by mouth every 6 (six) hours as needed for moderate pain.       Allergies:  Allergies  Allergen Reactions  . Macrobid [Nitrofurantoin Macrocrystal] Nausea Only    Past Medical History, Surgical history, Social history, and Family History were reviewed and updated.  Review of Systems: All other 10 point review of systems is negative.   Physical Exam:  weight is 136 lb (61.7 kg). Her oral temperature is 98.9 F (37.2 C). Her blood pressure is 133/69 and her pulse is 93. Her respiration is 20 and oxygen saturation is 100%.   Wt Readings from Last 3 Encounters:  10/05/19 136 lb (61.7 kg)  08/24/19 132 lb (59.9 kg)  07/28/19 131 lb (59.4 kg)    Ocular: Sclerae unicteric, pupils equal, round and reactive to light Ear-nose-throat: Oropharynx clear, dentition fair Lymphatic: No cervical or supraclavicular adenopathy Lungs no rales or rhonchi, good excursion bilaterally Heart regular rate and rhythm, no murmur appreciated Abd soft, nontender, positive bowel sounds, no liver or spleen tip palpated on exam, no fluid wave  MSK no focal spinal tenderness, no joint edema Neuro: non-focal, well-oriented, appropriate affect Breasts: Deferred   Lab Results  Component Value Date   WBC 8.5 10/05/2019   HGB 10.4 (L) 10/05/2019   HCT 31.0 (L) 10/05/2019   MCV 107.6 (H) 10/05/2019   PLT 308 10/05/2019   Lab Results  Component Value Date   FERRITIN 233 08/24/2019   IRON 62 08/24/2019   TIBC 211 (L) 08/24/2019   UIBC 150 08/24/2019   IRONPCTSAT 29 08/24/2019   Lab Results  Component Value Date   RETICCTPCT 1.5 10/05/2019   RBC 2.88 (L) 10/05/2019   RBC 2.88 (L) 10/05/2019   Lab Results  Component Value Date    KPAFRELGTCHN 56.3 (H) 08/24/2019   LAMBDASER 43.4 (H) 08/24/2019   KAPLAMBRATIO 1.30 08/24/2019   Lab Results  Component Value Date   IGGSERUM 2,201 (H) 08/24/2019   IGA 386 08/24/2019   IGMSERUM 109 08/24/2019   Lab Results  Component Value Date   TOTALPROTELP 7.5 08/24/2019   ALBUMINELP 3.5 08/24/2019   A1GS 0.3 08/24/2019   A2GS 0.8 08/24/2019   BETS 1.0 08/24/2019   GAMS 2.0 (H) 08/24/2019   MSPIKE 1.0 (H) 08/24/2019   SPEI Comment 04/22/2019     Chemistry      Component Value Date/Time   NA 135 09/14/2019 1334   K 3.9 09/14/2019 1334   CL 100 09/14/2019 1334   CO2 28 09/14/2019 1334   BUN 12 09/14/2019 1334   CREATININE 1.05 (H) 09/14/2019 1334   CREATININE 1.19 (H) 05/07/2012 1553      Component Value Date/Time   CALCIUM 9.2 09/14/2019 1334   ALKPHOS 79 09/14/2019 1334   AST 14 (L) 09/14/2019 1334   ALT 6 09/14/2019 1334   BILITOT 0.3 09/14/2019 1334       Impression and Plan: Mallory Murphy is a very pleasant 74 yo caucasian female with multifactorial anemia as well as IgG kappa MGUS.  Protein studies are pending.  She received Aranesp today for Hgb 10.4.  We will see what her iron studies look like and replace if needed.  We will see her again in 3 weeks for lab and injection and follow-up in 6 weeks.  She will contact our office with any questions or concerns. We can certainly see her sooner if needed.   Laverna Peace, NP 6/23/20212:18 PM

## 2019-10-05 NOTE — Patient Instructions (Signed)
Darbepoetin Alfa injection What is this medicine? DARBEPOETIN ALFA (dar be POE e tin AL fa) helps your body make more red blood cells. It is used to treat anemia caused by chronic kidney failure and chemotherapy. This medicine may be used for other purposes; ask your health care provider or pharmacist if you have questions. COMMON BRAND NAME(S): Aranesp What should I tell my health care provider before I take this medicine? They need to know if you have any of these conditions:  blood clotting disorders or history of blood clots  cancer patient not on chemotherapy  cystic fibrosis  heart disease, such as angina, heart failure, or a history of a heart attack  hemoglobin level of 12 g/dL or greater  high blood pressure  low levels of folate, iron, or vitamin B12  seizures  an unusual or allergic reaction to darbepoetin, erythropoietin, albumin, hamster proteins, latex, other medicines, foods, dyes, or preservatives  pregnant or trying to get pregnant  breast-feeding How should I use this medicine? This medicine is for injection into a vein or under the skin. It is usually given by a health care professional in a hospital or clinic setting. If you get this medicine at home, you will be taught how to prepare and give this medicine. Use exactly as directed. Take your medicine at regular intervals. Do not take your medicine more often than directed. It is important that you put your used needles and syringes in a special sharps container. Do not put them in a trash can. If you do not have a sharps container, call your pharmacist or healthcare provider to get one. A special MedGuide will be given to you by the pharmacist with each prescription and refill. Be sure to read this information carefully each time. Talk to your pediatrician regarding the use of this medicine in children. While this medicine may be used in children as young as 1 month of age for selected conditions, precautions do  apply. Overdosage: If you think you have taken too much of this medicine contact a poison control center or emergency room at once. NOTE: This medicine is only for you. Do not share this medicine with others. What if I miss a dose? If you miss a dose, take it as soon as you can. If it is almost time for your next dose, take only that dose. Do not take double or extra doses. What may interact with this medicine? Do not take this medicine with any of the following medications:  epoetin alfa This list may not describe all possible interactions. Give your health care provider a list of all the medicines, herbs, non-prescription drugs, or dietary supplements you use. Also tell them if you smoke, drink alcohol, or use illegal drugs. Some items may interact with your medicine. What should I watch for while using this medicine? Your condition will be monitored carefully while you are receiving this medicine. You may need blood work done while you are taking this medicine. This medicine may cause a decrease in vitamin B6. You should make sure that you get enough vitamin B6 while you are taking this medicine. Discuss the foods you eat and the vitamins you take with your health care professional. What side effects may I notice from receiving this medicine? Side effects that you should report to your doctor or health care professional as soon as possible:  allergic reactions like skin rash, itching or hives, swelling of the face, lips, or tongue  breathing problems  changes in   vision  chest pain  confusion, trouble speaking or understanding  feeling faint or lightheaded, falls  high blood pressure  muscle aches or pains  pain, swelling, warmth in the leg  rapid weight gain  severe headaches  sudden numbness or weakness of the face, arm or leg  trouble walking, dizziness, loss of balance or coordination  seizures (convulsions)  swelling of the ankles, feet, hands  unusually weak or  tired Side effects that usually do not require medical attention (report to your doctor or health care professional if they continue or are bothersome):  diarrhea  fever, chills (flu-like symptoms)  headaches  nausea, vomiting  redness, stinging, or swelling at site where injected This list may not describe all possible side effects. Call your doctor for medical advice about side effects. You may report side effects to FDA at 1-800-FDA-1088. Where should I keep my medicine? Keep out of the reach of children. Store in a refrigerator between 2 and 8 degrees C (36 and 46 degrees F). Do not freeze. Do not shake. Throw away any unused portion if using a single-dose vial. Throw away any unused medicine after the expiration date. NOTE: This sheet is a summary. It may not cover all possible information. If you have questions about this medicine, talk to your doctor, pharmacist, or health care provider.  2020 Elsevier/Gold Standard (2017-04-15 16:44:20)  

## 2019-10-06 ENCOUNTER — Other Ambulatory Visit: Payer: Self-pay | Admitting: Family

## 2019-10-06 LAB — IRON AND TIBC
Iron: 39 ug/dL — ABNORMAL LOW (ref 41–142)
Saturation Ratios: 19 % — ABNORMAL LOW (ref 21–57)
TIBC: 201 ug/dL — ABNORMAL LOW (ref 236–444)
UIBC: 162 ug/dL (ref 120–384)

## 2019-10-06 LAB — FERRITIN: Ferritin: 202 ng/mL (ref 11–307)

## 2019-10-11 ENCOUNTER — Other Ambulatory Visit: Payer: Self-pay | Admitting: Obstetrics and Gynecology

## 2019-10-11 DIAGNOSIS — Z1231 Encounter for screening mammogram for malignant neoplasm of breast: Secondary | ICD-10-CM

## 2019-10-19 ENCOUNTER — Other Ambulatory Visit: Payer: Self-pay

## 2019-10-19 ENCOUNTER — Ambulatory Visit
Admission: RE | Admit: 2019-10-19 | Discharge: 2019-10-19 | Disposition: A | Discharge status: Home / Self Care | Payer: Medicare Other | Source: Ambulatory Visit | Attending: Obstetrics and Gynecology | Admitting: Obstetrics and Gynecology

## 2019-10-19 DIAGNOSIS — Z1231 Encounter for screening mammogram for malignant neoplasm of breast: Secondary | ICD-10-CM

## 2019-10-26 ENCOUNTER — Inpatient Hospital Stay: Payer: Medicare Other | Attending: Hematology & Oncology

## 2019-10-26 ENCOUNTER — Inpatient Hospital Stay: Payer: Medicare Other

## 2019-10-26 ENCOUNTER — Other Ambulatory Visit: Payer: Self-pay

## 2019-10-26 DIAGNOSIS — D631 Anemia in chronic kidney disease: Secondary | ICD-10-CM

## 2019-10-26 DIAGNOSIS — N189 Chronic kidney disease, unspecified: Secondary | ICD-10-CM | POA: Insufficient documentation

## 2019-10-26 DIAGNOSIS — D500 Iron deficiency anemia secondary to blood loss (chronic): Secondary | ICD-10-CM

## 2019-10-26 LAB — CBC WITH DIFFERENTIAL (CANCER CENTER ONLY)
Abs Immature Granulocytes: 0.02 10*3/uL (ref 0.00–0.07)
Basophils Absolute: 0 10*3/uL (ref 0.0–0.1)
Basophils Relative: 0 %
Eosinophils Absolute: 0.4 10*3/uL (ref 0.0–0.5)
Eosinophils Relative: 5 %
HCT: 34 % — ABNORMAL LOW (ref 36.0–46.0)
Hemoglobin: 11.5 g/dL — ABNORMAL LOW (ref 12.0–15.0)
Immature Granulocytes: 0 %
Lymphocytes Relative: 30 %
Lymphs Abs: 2.2 10*3/uL (ref 0.7–4.0)
MCH: 35.6 pg — ABNORMAL HIGH (ref 26.0–34.0)
MCHC: 33.8 g/dL (ref 30.0–36.0)
MCV: 105.3 fL — ABNORMAL HIGH (ref 80.0–100.0)
Monocytes Absolute: 0.8 10*3/uL (ref 0.1–1.0)
Monocytes Relative: 11 %
Neutro Abs: 3.9 10*3/uL (ref 1.7–7.7)
Neutrophils Relative %: 54 %
Platelet Count: 273 10*3/uL (ref 150–400)
RBC: 3.23 MIL/uL — ABNORMAL LOW (ref 3.87–5.11)
RDW: 13.9 % (ref 11.5–15.5)
WBC Count: 7.4 10*3/uL (ref 4.0–10.5)
nRBC: 0 % (ref 0.0–0.2)

## 2019-10-26 LAB — CMP (CANCER CENTER ONLY)
ALT: 7 U/L (ref 0–44)
AST: 15 U/L (ref 15–41)
Albumin: 3.7 g/dL (ref 3.5–5.0)
Alkaline Phosphatase: 78 U/L (ref 38–126)
Anion gap: 7 (ref 5–15)
BUN: 11 mg/dL (ref 8–23)
CO2: 27 mmol/L (ref 22–32)
Calcium: 9 mg/dL (ref 8.9–10.3)
Chloride: 103 mmol/L (ref 98–111)
Creatinine: 0.82 mg/dL (ref 0.44–1.00)
GFR, Est AFR Am: >60 mL/min (ref >60)
GFR, Estimated: >60 mL/min (ref >60)
Glucose, Bld: 88 mg/dL (ref 70–99)
Potassium: 3.8 mmol/L (ref 3.5–5.1)
Sodium: 137 mmol/L (ref 135–145)
Total Bilirubin: 0.4 mg/dL (ref 0.3–1.2)
Total Protein: 7.4 g/dL (ref 6.5–8.1)

## 2019-10-26 NOTE — Progress Notes (Signed)
Injection not needed today, patient aware and given a copy of labs.

## 2019-11-16 ENCOUNTER — Inpatient Hospital Stay: Payer: Medicare Other

## 2019-11-16 ENCOUNTER — Inpatient Hospital Stay: Payer: Medicare Other | Admitting: Family

## 2019-11-16 ENCOUNTER — Other Ambulatory Visit: Payer: Self-pay

## 2019-11-18 ENCOUNTER — Other Ambulatory Visit: Payer: Self-pay

## 2019-11-18 ENCOUNTER — Inpatient Hospital Stay: Payer: Medicare Other | Attending: Hematology & Oncology

## 2019-11-18 ENCOUNTER — Encounter: Payer: Self-pay | Admitting: Family

## 2019-11-18 ENCOUNTER — Ambulatory Visit: Payer: Medicare Other

## 2019-11-18 ENCOUNTER — Inpatient Hospital Stay (HOSPITAL_BASED_OUTPATIENT_CLINIC_OR_DEPARTMENT_OTHER): Payer: Medicare Other | Admitting: Family

## 2019-11-18 VITALS — BP 152/78 | HR 92 | Temp 98.8°F | Resp 18 | Ht 61.0 in | Wt 138.8 lb

## 2019-11-18 DIAGNOSIS — D631 Anemia in chronic kidney disease: Secondary | ICD-10-CM

## 2019-11-18 DIAGNOSIS — M069 Rheumatoid arthritis, unspecified: Secondary | ICD-10-CM | POA: Diagnosis not present

## 2019-11-18 DIAGNOSIS — D500 Iron deficiency anemia secondary to blood loss (chronic): Secondary | ICD-10-CM

## 2019-11-18 DIAGNOSIS — D472 Monoclonal gammopathy: Secondary | ICD-10-CM | POA: Insufficient documentation

## 2019-11-18 DIAGNOSIS — N189 Chronic kidney disease, unspecified: Secondary | ICD-10-CM | POA: Diagnosis not present

## 2019-11-18 DIAGNOSIS — D509 Iron deficiency anemia, unspecified: Secondary | ICD-10-CM | POA: Diagnosis not present

## 2019-11-18 LAB — CBC WITH DIFFERENTIAL (CANCER CENTER ONLY)
Abs Immature Granulocytes: 0.02 10*3/uL (ref 0.00–0.07)
Basophils Absolute: 0 10*3/uL (ref 0.0–0.1)
Basophils Relative: 0 %
Eosinophils Absolute: 0.4 10*3/uL (ref 0.0–0.5)
Eosinophils Relative: 5 %
HCT: 32.9 % — ABNORMAL LOW (ref 36.0–46.0)
Hemoglobin: 11.2 g/dL — ABNORMAL LOW (ref 12.0–15.0)
Immature Granulocytes: 0 %
Lymphocytes Relative: 30 %
Lymphs Abs: 2.4 10*3/uL (ref 0.7–4.0)
MCH: 34.7 pg — ABNORMAL HIGH (ref 26.0–34.0)
MCHC: 34 g/dL (ref 30.0–36.0)
MCV: 101.9 fL — ABNORMAL HIGH (ref 80.0–100.0)
Monocytes Absolute: 0.9 10*3/uL (ref 0.1–1.0)
Monocytes Relative: 11 %
Neutro Abs: 4.2 10*3/uL (ref 1.7–7.7)
Neutrophils Relative %: 54 %
Platelet Count: 277 10*3/uL (ref 150–400)
RBC: 3.23 MIL/uL — ABNORMAL LOW (ref 3.87–5.11)
RDW: 13.7 % (ref 11.5–15.5)
WBC Count: 7.9 10*3/uL (ref 4.0–10.5)
nRBC: 0 % (ref 0.0–0.2)

## 2019-11-18 LAB — RETICULOCYTES
Immature Retic Fract: 14 % (ref 2.3–15.9)
RBC.: 3.19 MIL/uL — ABNORMAL LOW (ref 3.87–5.11)
Retic Count, Absolute: 28.1 10*3/uL (ref 19.0–186.0)
Retic Ct Pct: 0.9 % (ref 0.4–3.1)

## 2019-11-18 LAB — CMP (CANCER CENTER ONLY)
ALT: 7 U/L (ref 0–44)
AST: 16 U/L (ref 15–41)
Albumin: 3.8 g/dL (ref 3.5–5.0)
Alkaline Phosphatase: 72 U/L (ref 38–126)
Anion gap: 8 (ref 5–15)
BUN: 11 mg/dL (ref 8–23)
CO2: 27 mmol/L (ref 22–32)
Calcium: 9.3 mg/dL (ref 8.9–10.3)
Chloride: 101 mmol/L (ref 98–111)
Creatinine: 1 mg/dL (ref 0.44–1.00)
GFR, Est AFR Am: >60 mL/min (ref >60)
GFR, Estimated: 56 mL/min — ABNORMAL LOW (ref >60)
Glucose, Bld: 104 mg/dL — ABNORMAL HIGH (ref 70–99)
Potassium: 3.8 mmol/L (ref 3.5–5.1)
Sodium: 136 mmol/L (ref 135–145)
Total Bilirubin: 0.4 mg/dL (ref 0.3–1.2)
Total Protein: 7.8 g/dL (ref 6.5–8.1)

## 2019-11-18 NOTE — Progress Notes (Signed)
Hematology and Oncology Follow Up Visit  Daira Hine [id number] 1946/01/03 74 y.o. 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 Matsushima is here today for follow-up. She is doing well and has no complaints at this time.  Hgb is stable at 11.2! In May, M-spike was 1.0. IgG level was 1,977 mg/dL and kappa light chains 5.63 mg/L.  She has not noted any episodes of bleeding. No bruising or petechiae.  No fever, chills, n/v, cough, rash, dizziness, SOB, chest pain, palpitations, abdominal pain or changes in bowel or bladder habits.  No swelling, tenderness in her extremities.  She has intermittent numbness and tingling in her hands and feet.  No falls or syncopal episodes to report.  She has maintained a good appetite and is staying well hydrated. Her weight is stable.   ECOG Performance Status: 1 - Symptomatic but completely ambulatory  Medications:  Allergies as of 11/18/2019      Reactions   Macrobid [nitrofurantoin Macrocrystal] Nausea Only      Medication List       Accurate as of November 18, 2019  2:27 PM. If you have any questions, ask your nurse or doctor.        chlorhexidine 0.12 % solution Commonly known as: PERIDEX chlorhexidine gluconate 0.12 % mouthwash  RM WITH 15 ML BID AND EXPECTORATE   ciclopirox 8 % solution Commonly known as: PENLAC ciclopirox 8 % topical solution  APPLY TO THE AFFECTED AREA DAILY. REMOVE WEEKLY WITH NAIL POLISH REMOVER AND REAPPLY.   diphenhydrAMINE 25 mg capsule Commonly known as: BENADRYL Take 25 mg by mouth at bedtime as needed.   docusate sodium 100 MG capsule Commonly known as: COLACE Take 100 mg by mouth daily as needed for mild constipation.   escitalopram 10 MG tablet Commonly known as: LEXAPRO Take 10 mg by mouth daily.     losartan 100 MG tablet Commonly known as: COZAAR 100 mg daily.   oxybutynin 5 MG 24 hr tablet Commonly known as: DITROPAN-XL Take 5 mg by mouth at bedtime.   [place] ARIA IV Inject into the vein every 8 (eight) weeks. For RA [facility] Rhematology.   traMADol 50 MG tablet Commonly known as: ULTRAM Take 1-2 tablets (50-100 mg total) by mouth every 6 (six) hours as needed for moderate pain.       Allergies:  Allergies  Allergen Reactions  . Macrobid [Nitrofurantoin Macrocrystal] Nausea Only    Past Medical History, Surgical history, Social history, and Family History were reviewed and updated.  Review of Systems: All other 10 point review of systems is negative.   Physical Exam:  height is 5\' 1"  (1.549 m) and weight is 138 lb 12.8 oz (63 kg). Her oral temperature is 98.8 F (37.1 C). Her blood pressure is 152/78 (abnormal) and her pulse is 92. Her respiration is 18 and oxygen saturation is 99%.   Wt Readings from Last 3 Encounters:  11/18/19 138 lb 12.8 oz (63 kg)  10/05/19 136 lb (61.7 kg)  08/24/19 132 lb (59.9 kg)    Ocular: Sclerae unicteric, pupils equal, round and reactive to light Ear-nose-throat: Oropharynx clear, dentition fair Lymphatic: No cervical or supraclavicular adenopathy Lungs no rales or rhonchi, good excursion bilaterally Heart regular rate and rhythm, no murmur appreciated Abd soft, nontender, positive bowel sounds, no liver or spleen  tip palpated on exam, no fluid wave  MSK no focal spinal tenderness, no joint edema Neuro: non-focal, well-oriented, appropriate affect Breasts: Deferred   Lab Results  Component Value Date   WBC 7.9 11/18/2019   HGB 11.2 (L) 11/18/2019   HCT 32.9 (L) 11/18/2019   MCV 101.9 (H) 11/18/2019   PLT 277 11/18/2019   Lab Results  Component Value Date   FERRITIN 202 10/05/2019   IRON 39 (L) 10/05/2019   TIBC 201 (L) 10/05/2019   UIBC 162 10/05/2019   IRONPCTSAT 19 (L) 10/05/2019   Lab Results  Component  Value Date   RETICCTPCT 0.9 11/18/2019   RBC 3.19 (L) 11/18/2019   Lab Results  Component Value Date   KPAFRELGTCHN 56.3 (H) 08/24/2019   LAMBDASER 43.4 (H) 08/24/2019   KAPLAMBRATIO 1.30 08/24/2019   Lab Results  Component Value Date   IGGSERUM 2,201 (H) 08/24/2019   IGA 386 08/24/2019   IGMSERUM 109 08/24/2019   Lab Results  Component Value Date   TOTALPROTELP 7.5 08/24/2019   ALBUMINELP 3.5 08/24/2019   A1GS 0.3 08/24/2019   A2GS 0.8 08/24/2019   BETS 1.0 08/24/2019   GAMS 2.0 (H) 08/24/2019   MSPIKE 1.0 (H) 08/24/2019   SPEI Comment 04/22/2019     Chemistry      Component Value Date/Time   NA 137 10/26/2019 1357   K 3.8 10/26/2019 1357   CL 103 10/26/2019 1357   CO2 27 10/26/2019 1357   BUN 11 10/26/2019 1357   CREATININE 0.82 10/26/2019 1357   CREATININE 1.19 (H) 05/07/2012 1553      Component Value Date/Time   CALCIUM 9.0 10/26/2019 1357   ALKPHOS 78 10/26/2019 1357   AST 15 10/26/2019 1357   ALT 7 10/26/2019 1357   BILITOT 0.4 10/26/2019 1357       Impression and Plan: Ms. Mcelrath is a very pleasant 74 yo caucasian female with multifactorial anemiaas well as IgG kappa MGUS.  No Aranesp needed this visit.  We will see what her iron studies look like and replace if needed.  We will plan to check her labs monthly and do an injection if needed with follow-up in 2 months.  She can contact our office with any questions or concerns.  Laverna Peace, NP 8/6/20212:27 PM

## 2019-11-21 LAB — FERRITIN: Ferritin: 199 ng/mL (ref 11–307)

## 2019-11-21 LAB — IRON AND TIBC
Iron: 51 ug/dL (ref 41–142)
Saturation Ratios: 26 % (ref 21–57)
TIBC: 200 ug/dL — ABNORMAL LOW (ref 236–444)
UIBC: 149 ug/dL (ref 120–384)

## 2019-12-20 ENCOUNTER — Inpatient Hospital Stay: Payer: Medicare Other

## 2019-12-20 ENCOUNTER — Other Ambulatory Visit: Payer: Self-pay

## 2019-12-20 ENCOUNTER — Inpatient Hospital Stay: Payer: Medicare Other | Attending: Hematology & Oncology

## 2019-12-20 ENCOUNTER — Ambulatory Visit: Payer: Medicare Other | Admitting: Family

## 2019-12-20 VITALS — BP 140/66 | HR 94 | Temp 98.8°F | Resp 18

## 2019-12-20 DIAGNOSIS — D631 Anemia in chronic kidney disease: Secondary | ICD-10-CM

## 2019-12-20 DIAGNOSIS — D500 Iron deficiency anemia secondary to blood loss (chronic): Secondary | ICD-10-CM

## 2019-12-20 DIAGNOSIS — N189 Chronic kidney disease, unspecified: Secondary | ICD-10-CM | POA: Diagnosis not present

## 2019-12-20 LAB — CBC WITH DIFFERENTIAL (CANCER CENTER ONLY)
Abs Immature Granulocytes: 0.01 10*3/uL (ref 0.00–0.07)
Basophils Absolute: 0 10*3/uL (ref 0.0–0.1)
Basophils Relative: 0 %
Eosinophils Absolute: 0.4 10*3/uL (ref 0.0–0.5)
Eosinophils Relative: 6 %
HCT: 29.8 % — ABNORMAL LOW (ref 36.0–46.0)
Hemoglobin: 10.1 g/dL — ABNORMAL LOW (ref 12.0–15.0)
Immature Granulocytes: 0 %
Lymphocytes Relative: 33 %
Lymphs Abs: 2.2 10*3/uL (ref 0.7–4.0)
MCH: 35.2 pg — ABNORMAL HIGH (ref 26.0–34.0)
MCHC: 33.9 g/dL (ref 30.0–36.0)
MCV: 103.8 fL — ABNORMAL HIGH (ref 80.0–100.0)
Monocytes Absolute: 0.7 10*3/uL (ref 0.1–1.0)
Monocytes Relative: 10 %
Neutro Abs: 3.3 10*3/uL (ref 1.7–7.7)
Neutrophils Relative %: 51 %
Platelet Count: 286 10*3/uL (ref 150–400)
RBC: 2.87 MIL/uL — ABNORMAL LOW (ref 3.87–5.11)
RDW: 14.6 % (ref 11.5–15.5)
WBC Count: 6.7 10*3/uL (ref 4.0–10.5)
nRBC: 0 % (ref 0.0–0.2)

## 2019-12-20 LAB — CMP (CANCER CENTER ONLY)
ALT: 5 U/L (ref 0–44)
AST: 12 U/L — ABNORMAL LOW (ref 15–41)
Albumin: 3.6 g/dL (ref 3.5–5.0)
Alkaline Phosphatase: 75 U/L (ref 38–126)
Anion gap: 8 (ref 5–15)
BUN: 10 mg/dL (ref 8–23)
CO2: 27 mmol/L (ref 22–32)
Calcium: 9.2 mg/dL (ref 8.9–10.3)
Chloride: 103 mmol/L (ref 98–111)
Creatinine: 0.98 mg/dL (ref 0.44–1.00)
GFR, Est AFR Am: >60 mL/min (ref >60)
GFR, Estimated: 57 mL/min — ABNORMAL LOW (ref >60)
Glucose, Bld: 104 mg/dL — ABNORMAL HIGH (ref 70–99)
Potassium: 3.6 mmol/L (ref 3.5–5.1)
Sodium: 138 mmol/L (ref 135–145)
Total Bilirubin: 0.3 mg/dL (ref 0.3–1.2)
Total Protein: 7.5 g/dL (ref 6.5–8.1)

## 2019-12-20 MED ORDER — DARBEPOETIN ALFA 300 MCG/0.6ML IJ SOSY
PREFILLED_SYRINGE | INTRAMUSCULAR | Status: AC
  Filled 2019-12-20: qty 0.6

## 2019-12-20 MED ORDER — DARBEPOETIN ALFA 300 MCG/0.6ML IJ SOSY
300.0000 ug | PREFILLED_SYRINGE | Freq: Once | INTRAMUSCULAR | Status: AC
  Administered 2019-12-20: 300 ug via SUBCUTANEOUS

## 2019-12-20 NOTE — Patient Instructions (Signed)
Darbepoetin Alfa injection What is this medicine? DARBEPOETIN ALFA (dar be POE e tin AL fa) helps your body make more red blood cells. It is used to treat anemia caused by chronic kidney failure and chemotherapy. This medicine may be used for other purposes; ask your health care provider or pharmacist if you have questions. COMMON BRAND NAME(S): Aranesp What should I tell my health care provider before I take this medicine? They need to know if you have any of these conditions:  blood clotting disorders or history of blood clots  cancer patient not on chemotherapy  cystic fibrosis  heart disease, such as angina, heart failure, or a history of a heart attack  hemoglobin level of 12 g/dL or greater  high blood pressure  low levels of folate, iron, or vitamin B12  seizures  an unusual or allergic reaction to darbepoetin, erythropoietin, albumin, hamster proteins, latex, other medicines, foods, dyes, or preservatives  pregnant or trying to get pregnant  breast-feeding How should I use this medicine? This medicine is for injection into a vein or under the skin. It is usually given by a health care professional in a hospital or clinic setting. If you get this medicine at home, you will be taught how to prepare and give this medicine. Use exactly as directed. Take your medicine at regular intervals. Do not take your medicine more often than directed. It is important that you put your used needles and syringes in a special sharps container. Do not put them in a trash can. If you do not have a sharps container, call your pharmacist or healthcare provider to get one. A special MedGuide will be given to you by the pharmacist with each prescription and refill. Be sure to read this information carefully each time. Talk to your pediatrician regarding the use of this medicine in children. While this medicine may be used in children as young as 1 month of age for selected conditions, precautions do  apply. Overdosage: If you think you have taken too much of this medicine contact a poison control center or emergency room at once. NOTE: This medicine is only for you. Do not share this medicine with others. What if I miss a dose? If you miss a dose, take it as soon as you can. If it is almost time for your next dose, take only that dose. Do not take double or extra doses. What may interact with this medicine? Do not take this medicine with any of the following medications:  epoetin alfa This list may not describe all possible interactions. Give your health care provider a list of all the medicines, herbs, non-prescription drugs, or dietary supplements you use. Also tell them if you smoke, drink alcohol, or use illegal drugs. Some items may interact with your medicine. What should I watch for while using this medicine? Your condition will be monitored carefully while you are receiving this medicine. You may need blood work done while you are taking this medicine. This medicine may cause a decrease in vitamin B6. You should make sure that you get enough vitamin B6 while you are taking this medicine. Discuss the foods you eat and the vitamins you take with your health care professional. What side effects may I notice from receiving this medicine? Side effects that you should report to your doctor or health care professional as soon as possible:  allergic reactions like skin rash, itching or hives, swelling of the face, lips, or tongue  breathing problems  changes in   vision  chest pain  confusion, trouble speaking or understanding  feeling faint or lightheaded, falls  high blood pressure  muscle aches or pains  pain, swelling, warmth in the leg  rapid weight gain  severe headaches  sudden numbness or weakness of the face, arm or leg  trouble walking, dizziness, loss of balance or coordination  seizures (convulsions)  swelling of the ankles, feet, hands  unusually weak or  tired Side effects that usually do not require medical attention (report to your doctor or health care professional if they continue or are bothersome):  diarrhea  fever, chills (flu-like symptoms)  headaches  nausea, vomiting  redness, stinging, or swelling at site where injected This list may not describe all possible side effects. Call your doctor for medical advice about side effects. You may report side effects to FDA at 1-800-FDA-1088. Where should I keep my medicine? Keep out of the reach of children. Store in a refrigerator between 2 and 8 degrees C (36 and 46 degrees F). Do not freeze. Do not shake. Throw away any unused portion if using a single-dose vial. Throw away any unused medicine after the expiration date. NOTE: This sheet is a summary. It may not cover all possible information. If you have questions about this medicine, talk to your doctor, pharmacist, or health care provider.  2020 Elsevier/Gold Standard (2017-04-15 16:44:20)  

## 2019-12-27 ENCOUNTER — Other Ambulatory Visit: Payer: Self-pay | Admitting: Internal Medicine

## 2019-12-27 DIAGNOSIS — M810 Age-related osteoporosis without current pathological fracture: Secondary | ICD-10-CM

## 2020-01-19 ENCOUNTER — Other Ambulatory Visit: Payer: Self-pay

## 2020-01-19 ENCOUNTER — Telehealth: Payer: Self-pay | Admitting: Family

## 2020-01-19 ENCOUNTER — Inpatient Hospital Stay (HOSPITAL_BASED_OUTPATIENT_CLINIC_OR_DEPARTMENT_OTHER): Payer: Medicare Other | Admitting: Family

## 2020-01-19 ENCOUNTER — Encounter: Payer: Self-pay | Admitting: Family

## 2020-01-19 ENCOUNTER — Other Ambulatory Visit: Payer: Self-pay | Admitting: Family

## 2020-01-19 ENCOUNTER — Inpatient Hospital Stay: Payer: Medicare Other | Attending: Hematology & Oncology

## 2020-01-19 ENCOUNTER — Inpatient Hospital Stay: Payer: Medicare Other

## 2020-01-19 VITALS — BP 139/77 | HR 92 | Temp 98.5°F | Resp 18 | Ht 61.0 in | Wt 134.4 lb

## 2020-01-19 DIAGNOSIS — D509 Iron deficiency anemia, unspecified: Secondary | ICD-10-CM | POA: Diagnosis not present

## 2020-01-19 DIAGNOSIS — M069 Rheumatoid arthritis, unspecified: Secondary | ICD-10-CM | POA: Diagnosis not present

## 2020-01-19 DIAGNOSIS — D631 Anemia in chronic kidney disease: Secondary | ICD-10-CM

## 2020-01-19 DIAGNOSIS — N189 Chronic kidney disease, unspecified: Secondary | ICD-10-CM | POA: Insufficient documentation

## 2020-01-19 DIAGNOSIS — D500 Iron deficiency anemia secondary to blood loss (chronic): Secondary | ICD-10-CM

## 2020-01-19 DIAGNOSIS — D472 Monoclonal gammopathy: Secondary | ICD-10-CM | POA: Insufficient documentation

## 2020-01-19 LAB — CMP (CANCER CENTER ONLY)
ALT: 8 U/L (ref 0–44)
AST: 19 U/L (ref 15–41)
Albumin: 4 g/dL (ref 3.5–5.0)
Alkaline Phosphatase: 77 U/L (ref 38–126)
Anion gap: 9 (ref 5–15)
BUN: 13 mg/dL (ref 8–23)
CO2: 29 mmol/L (ref 22–32)
Calcium: 9.6 mg/dL (ref 8.9–10.3)
Chloride: 99 mmol/L (ref 98–111)
Creatinine: 1.12 mg/dL — ABNORMAL HIGH (ref 0.44–1.00)
GFR, Estimated: 49 mL/min — ABNORMAL LOW (ref >60)
Glucose, Bld: 99 mg/dL (ref 70–99)
Potassium: 3.9 mmol/L (ref 3.5–5.1)
Sodium: 137 mmol/L (ref 135–145)
Total Bilirubin: 0.4 mg/dL (ref 0.3–1.2)
Total Protein: 7.8 g/dL (ref 6.5–8.1)

## 2020-01-19 LAB — CBC WITH DIFFERENTIAL (CANCER CENTER ONLY)
Abs Immature Granulocytes: 0.02 10*3/uL (ref 0.00–0.07)
Basophils Absolute: 0 10*3/uL (ref 0.0–0.1)
Basophils Relative: 1 %
Eosinophils Absolute: 0.4 10*3/uL (ref 0.0–0.5)
Eosinophils Relative: 4 %
HCT: 36.3 % (ref 36.0–46.0)
Hemoglobin: 12.1 g/dL (ref 12.0–15.0)
Immature Granulocytes: 0 %
Lymphocytes Relative: 33 %
Lymphs Abs: 2.7 10*3/uL (ref 0.7–4.0)
MCH: 34.8 pg — ABNORMAL HIGH (ref 26.0–34.0)
MCHC: 33.3 g/dL (ref 30.0–36.0)
MCV: 104.3 fL — ABNORMAL HIGH (ref 80.0–100.0)
Monocytes Absolute: 0.8 10*3/uL (ref 0.1–1.0)
Monocytes Relative: 10 %
Neutro Abs: 4.4 10*3/uL (ref 1.7–7.7)
Neutrophils Relative %: 52 %
Platelet Count: 215 10*3/uL (ref 150–400)
RBC: 3.48 MIL/uL — ABNORMAL LOW (ref 3.87–5.11)
RDW: 15.8 % — ABNORMAL HIGH (ref 11.5–15.5)
WBC Count: 8.4 10*3/uL (ref 4.0–10.5)
nRBC: 0 % (ref 0.0–0.2)

## 2020-01-19 LAB — RETICULOCYTES
Immature Retic Fract: 12.3 % (ref 2.3–15.9)
RBC.: 3.42 MIL/uL — ABNORMAL LOW (ref 3.87–5.11)
Retic Count, Absolute: 25.7 10*3/uL (ref 19.0–186.0)
Retic Ct Pct: 0.8 % (ref 0.4–3.1)

## 2020-01-19 NOTE — Progress Notes (Signed)
Hematology and Oncology Follow Up Visit  Mallory Murphy [id number] 1946/04/08 74 y.o. 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 She is doing well and has no complaints at this time.  No episodes of bleeding, no bruising or petechiae.  No fever, chills, n/v, cough, rash, dizziness, SOB, chest pain, palpitations, abdominal pain or changes in bowel or bladder habits.  No swelling, tenderness, numbness or tingling in her extremities.  No falls or syncope.  She has maintained a good appetite and is staying well hydrated. Her weight is stable.   ECOG Performance Status: 0 - Asymptomatic  Medications:  Allergies as of 01/19/2020      Reactions   Macrobid [nitrofurantoin Macrocrystal] Nausea Only      Medication List       Accurate as of January 19, 2020  2:16 PM. If you have any questions, ask your nurse or doctor.        chlorhexidine 0.12 % solution Commonly known as: PERIDEX chlorhexidine gluconate 0.12 % mouthwash  RM WITH 15 ML BID AND EXPECTORATE   ciclopirox 8 % solution Commonly known as: PENLAC ciclopirox 8 % topical solution  APPLY TO THE AFFECTED AREA DAILY. REMOVE WEEKLY WITH NAIL POLISH REMOVER AND REAPPLY.   diphenhydrAMINE 25 mg capsule Commonly known as: BENADRYL Take 25 mg by mouth at bedtime as needed.   docusate sodium 100 MG capsule Commonly known as: COLACE Take 100 mg by mouth daily as needed for mild constipation.   escitalopram 10 MG tablet Commonly known as: LEXAPRO Take 10 mg by mouth daily.   losartan 100 MG tablet Commonly known as: COZAAR 100 mg daily.   oxybutynin 5 MG 24 hr tablet Commonly known as: DITROPAN-XL Take 5 mg by mouth at bedtime.   [place] ARIA IV Inject into the vein every 8 (eight)  weeks. For RA [facility] Rhematology.   traMADol 50 MG tablet Commonly known as: ULTRAM Take 1-2 tablets (50-100 mg total) by mouth every 6 (six) hours as needed for moderate pain.       Allergies:  Allergies  Allergen Reactions  . Macrobid [Nitrofurantoin Macrocrystal] Nausea Only    Past Medical History, Surgical history, Social history, and Family History were reviewed and updated.  Review of Systems: All other 10 point review of systems is negative.   Physical Exam:  height is 5\' 1"  (1.549 m) and weight is 134 lb 6.4 oz (61 kg). Her oral temperature is 98.5 F (36.9 C). Her blood pressure is 139/77 and her pulse is 92. Her respiration is 18 and oxygen saturation is 99%.   Wt Readings from Last 3 Encounters:  01/19/20 134 lb 6.4 oz (61 kg)  11/18/19 138 lb 12.8 oz (63 kg)  10/05/19 136 lb (61.7 kg)    Ocular: Sclerae unicteric, pupils equal, round and reactive to light Ear-nose-throat: Oropharynx clear, dentition fair Lymphatic: No cervical or supraclavicular adenopathy Lungs no rales or rhonchi, good excursion bilaterally Heart regular rate and rhythm, no murmur appreciated Abd soft, nontender, positive bowel sounds MSK no focal spinal tenderness, no joint edema Neuro: non-focal, well-oriented, appropriate affect Breasts: Deferred   Lab Results  Component Value Date   WBC 8.4 01/19/2020   HGB 12.1 01/19/2020   HCT 36.3 01/19/2020   MCV 104.3 (H) 01/19/2020  PLT 215 01/19/2020   Lab Results  Component Value Date   FERRITIN 199 11/18/2019   IRON 51 11/18/2019   TIBC 200 (L) 11/18/2019   UIBC 149 11/18/2019   IRONPCTSAT 26 11/18/2019   Lab Results  Component Value Date   RETICCTPCT 0.8 01/19/2020   RBC 3.42 (L) 01/19/2020   Lab Results  Component Value Date   KPAFRELGTCHN 56.3 (H) 08/24/2019   LAMBDASER 43.4 (H) 08/24/2019   KAPLAMBRATIO 1.30 08/24/2019   Lab Results  Component Value Date   IGGSERUM 2,201 (H) 08/24/2019   IGA 386 08/24/2019    IGMSERUM 109 08/24/2019   Lab Results  Component Value Date   TOTALPROTELP 7.5 08/24/2019   ALBUMINELP 3.5 08/24/2019   A1GS 0.3 08/24/2019   A2GS 0.8 08/24/2019   BETS 1.0 08/24/2019   GAMS 2.0 (H) 08/24/2019   MSPIKE 1.0 (H) 08/24/2019   SPEI Comment 04/22/2019     Chemistry      Component Value Date/Time   NA 137 01/19/2020 1329   K 3.9 01/19/2020 1329   CL 99 01/19/2020 1329   CO2 29 01/19/2020 1329   BUN 13 01/19/2020 1329   CREATININE 1.12 (H) 01/19/2020 1329   CREATININE 1.19 (H) 05/07/2012 1553      Component Value Date/Time   CALCIUM 9.6 01/19/2020 1329   ALKPHOS 77 01/19/2020 1329   AST 19 01/19/2020 1329   ALT 8 01/19/2020 1329   BILITOT 0.4 01/19/2020 1329       Impression and Plan: Mallory Murphy is a very pleasant 74 yo caucasian female with multifactorial anemiaas well as IgG kappa MGUS.  No Aranesp needed this visit.  Iron studies pending. We will replace if needed.  Lab and injection in 1 month, follow-up in 2 months.  She can contact our office with any questions or concerns.   Laverna Peace, NP 10/7/20212:16 PM

## 2020-01-19 NOTE — Telephone Encounter (Signed)
Appointments scheduled calendar printed & mailed per 10/7 los

## 2020-01-20 LAB — IRON AND TIBC
Iron: 97 ug/dL (ref 41–142)
Saturation Ratios: 49 % (ref 21–57)
TIBC: 200 ug/dL — ABNORMAL LOW (ref 236–444)
UIBC: 103 ug/dL — ABNORMAL LOW (ref 120–384)

## 2020-01-20 LAB — FERRITIN: Ferritin: 202 ng/mL (ref 11–307)

## 2020-01-30 ENCOUNTER — Encounter (HOSPITAL_BASED_OUTPATIENT_CLINIC_OR_DEPARTMENT_OTHER): Payer: Self-pay | Admitting: Emergency Medicine

## 2020-01-30 ENCOUNTER — Inpatient Hospital Stay (HOSPITAL_BASED_OUTPATIENT_CLINIC_OR_DEPARTMENT_OTHER)
Admission: EM | Admit: 2020-01-30 | Discharge: 2020-02-06 | DRG: 552 | Disposition: A | Discharge status: Skilled Nursing Facility | Payer: Medicare Other | Attending: Internal Medicine | Admitting: Internal Medicine

## 2020-01-30 ENCOUNTER — Other Ambulatory Visit: Payer: Self-pay

## 2020-01-30 ENCOUNTER — Emergency Department (HOSPITAL_BASED_OUTPATIENT_CLINIC_OR_DEPARTMENT_OTHER): Payer: Medicare Other

## 2020-01-30 DIAGNOSIS — S3210XA Unspecified fracture of sacrum, initial encounter for closed fracture: Secondary | ICD-10-CM | POA: Diagnosis present

## 2020-01-30 DIAGNOSIS — R32 Unspecified urinary incontinence: Secondary | ICD-10-CM | POA: Diagnosis present

## 2020-01-30 DIAGNOSIS — R55 Syncope and collapse: Secondary | ICD-10-CM

## 2020-01-30 DIAGNOSIS — I10 Essential (primary) hypertension: Secondary | ICD-10-CM | POA: Diagnosis present

## 2020-01-30 DIAGNOSIS — N1831 Chronic kidney disease, stage 3a: Secondary | ICD-10-CM | POA: Diagnosis present

## 2020-01-30 DIAGNOSIS — Y92008 Other place in unspecified non-institutional (private) residence as the place of occurrence of the external cause: Secondary | ICD-10-CM

## 2020-01-30 DIAGNOSIS — M545 Low back pain, unspecified: Secondary | ICD-10-CM | POA: Diagnosis not present

## 2020-01-30 DIAGNOSIS — I129 Hypertensive chronic kidney disease with stage 1 through stage 4 chronic kidney disease, or unspecified chronic kidney disease: Secondary | ICD-10-CM | POA: Diagnosis present

## 2020-01-30 DIAGNOSIS — I951 Orthostatic hypotension: Secondary | ICD-10-CM | POA: Diagnosis present

## 2020-01-30 DIAGNOSIS — S3219XA Other fracture of sacrum, initial encounter for closed fracture: Principal | ICD-10-CM | POA: Diagnosis present

## 2020-01-30 DIAGNOSIS — I160 Hypertensive urgency: Secondary | ICD-10-CM | POA: Diagnosis present

## 2020-01-30 DIAGNOSIS — N183 Chronic kidney disease, stage 3 unspecified: Secondary | ICD-10-CM

## 2020-01-30 DIAGNOSIS — D509 Iron deficiency anemia, unspecified: Secondary | ICD-10-CM | POA: Diagnosis present

## 2020-01-30 DIAGNOSIS — Z20822 Contact with and (suspected) exposure to covid-19: Secondary | ICD-10-CM | POA: Diagnosis present

## 2020-01-30 DIAGNOSIS — D631 Anemia in chronic kidney disease: Secondary | ICD-10-CM | POA: Diagnosis present

## 2020-01-30 DIAGNOSIS — Z96651 Presence of right artificial knee joint: Secondary | ICD-10-CM | POA: Diagnosis present

## 2020-01-30 DIAGNOSIS — W1830XA Fall on same level, unspecified, initial encounter: Secondary | ICD-10-CM | POA: Diagnosis present

## 2020-01-30 MED ORDER — OXYCODONE-ACETAMINOPHEN 5-325 MG PO TABS
1.0000 | ORAL_TABLET | Freq: Four times a day (QID) | ORAL | Status: DC | PRN
  Administered 2020-01-31: 1 via ORAL
  Filled 2020-01-30: qty 1

## 2020-01-30 MED ORDER — FENTANYL CITRATE (PF) 100 MCG/2ML IJ SOLN
50.0000 ug | Freq: Once | INTRAMUSCULAR | Status: AC
  Administered 2020-01-30: 50 ug via INTRAVENOUS
  Filled 2020-01-30: qty 2

## 2020-01-30 MED ORDER — LOSARTAN POTASSIUM 50 MG PO TABS
100.0000 mg | ORAL_TABLET | Freq: Every day | ORAL | Status: DC
  Administered 2020-01-31 – 2020-02-06 (×7): 100 mg via ORAL
  Filled 2020-01-30 (×7): qty 2

## 2020-01-30 MED ORDER — ESCITALOPRAM OXALATE 10 MG PO TABS
10.0000 mg | ORAL_TABLET | Freq: Every day | ORAL | Status: DC
  Administered 2020-01-31 – 2020-02-06 (×7): 10 mg via ORAL
  Filled 2020-01-30 (×7): qty 1

## 2020-01-30 MED ORDER — OXYCODONE-ACETAMINOPHEN 5-325 MG PO TABS: 1.0000 | ORAL_TABLET | Freq: Once | ORAL | Status: DC

## 2020-01-30 NOTE — ED Notes (Signed)
Purwick placed, [employer] NT chap

## 2020-01-30 NOTE — ED Triage Notes (Signed)
Fell backward on to buttock 5 days ago.  Pain in low lumbar spine.  Did not seek medical care until going to [facility] yesterday.  Was kept over night.  Family is not satisfied with their answers and is concerned that they missed something and pt could end up paralyzed. Brought her here.

## 2020-01-30 NOTE — ED Provider Notes (Signed)
[facility] EMERGENCY DEPARTMENT Provider Note   CSN: [id number] Arrival date & time: 01/30/20  1654     History Chief Complaint  Patient presents with  . Back Pain    Elinor Kleine is a 74 y.o. female.  Presents to ER with concern for back pain.  Patient reports she had a fall a few days ago after a syncopal episode.  Landed on her back.  She has been having low back pain ever since, initially did not seek medical care but went to [facility] yesterday.  They performed CT head, x-rays of her back and hip and reported to her that everything was negative.  Due to her pain, she was kept in their ED for overnight observation, repeat labs in consultation with PT and OT.  Patient reports due to lack of adequate pain control and lack of seeing physical therapy, they decided to leave and come here.  Patient states pain is 10 out of 10 with movement, improved with rest, low back, across low back, sharp, stabbing.  Per review of chart, labs showed mild AKI initially, repeat labs this afternoon were normal.    HPI     Past Medical History:  Diagnosis Date  . Anemia    hx blood tranfusions  . 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 Arthritis   . Diverticulosis   . DJD (degenerative joint disease) of cervical spine   . 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 Hypertension   . Iron deficiency anemia due to chronic blood loss 11/19/2018  . Iron deficiency anemia due to chronic blood loss 11/19/2018    Patient Active Problem List   Diagnosis Date Noted  . 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 Iron deficiency anemia due to chronic blood loss 11/19/2018  . OA (osteoarthritis) of knee 05/26/2016  . AKI (acute kidney injury) ([place]) 06/21/2014  . Dehydration 06/21/2014  . Syncope and  collapse 06/21/2014  . Hypokalemia 06/21/2014  . Palpitation 06/21/2014  . HTN (hypertension) 05/07/2012  . Arthritis, rheumatoid ([place]) 05/07/2012    Past Surgical History:  Procedure Laterality Date  . ELBOW ARTHROPLASTY Right    X3   . HIP SURGERY    . JOINT REPLACEMENT     both hips  . 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 TOTAL SHOULDER ARTHROPLASTY Left      OB History   No obstetric history on file.     No family history on file.  Social History   Tobacco Use  . Smoking status: Never Smoker  . Smokeless tobacco: Never Used  Vaping Use  . Vaping Use: Never used  Substance Use Topics  . Alcohol use: Not Currently    Comment: socially  . Drug use: No    Home Medications Prior to Admission medications   Medication Sig Start Date End Date Taking? Authorizing Provider  chlorhexidine (PERIDEX) 0.12 % solution chlorhexidine gluconate 0.12 % mouthwash  RM WITH 15 ML BID AND EXPECTORATE    [provider]  ciclopirox (PENLAC) 8 % solution ciclopirox 8 % topical solution  APPLY TO THE AFFECTED AREA DAILY. REMOVE WEEKLY WITH NAIL POLISH REMOVER AND REAPPLY.    [provider]  diphenhydrAMINE (BENADRYL) 25 mg  capsule Take 25 mg by mouth at bedtime as needed.    [provider]  docusate sodium (COLACE) 100 MG capsule Take 100 mg by mouth daily as needed for mild constipation.    [provider]  escitalopram (LEXAPRO) 10 MG tablet Take 10 mg by mouth daily.    [provider]  Golimumab ([place] ARIA IV) Inject into the vein every 8 (eight) weeks. For RA [facility].    [provider]  losartan (COZAAR) 100 MG tablet 100 mg daily.    [provider]  oxybutynin (DITROPAN-XL) 5 MG 24 hr tablet Take 5 mg by mouth at bedtime.    [provider]    traMADol (ULTRAM) 50 MG tablet Take 1-2 tablets (50-100 mg total) by mouth every 6 (six) hours as needed for moderate pain. 05/27/16   Perkins, Alexzandrew L, PA-C    Allergies    Macrobid [nitrofurantoin macrocrystal]  Review of Systems   Review of Systems  Constitutional: Negative for chills and fever.  HENT: Negative for ear pain and sore throat.   Eyes: Negative for pain and visual disturbance.  Respiratory: Negative for cough and shortness of breath.   Cardiovascular: Negative for chest pain and palpitations.  Gastrointestinal: Negative for abdominal pain and vomiting.  Genitourinary: Negative for dysuria and hematuria.  Musculoskeletal: Positive for arthralgias and back pain.  Skin: Negative for color change and rash.  Neurological: Negative for seizures and syncope.  All other systems reviewed and are negative.   Physical Exam Updated Vital Signs BP (!) 172/81 (BP Location: Left Arm)   Pulse 88   Temp 98.9 F (37.2 C) (Oral)   Resp 16   Ht 5\' 1"  (1.549 m)   Wt 60.8 kg   SpO2 100%   BMI 25.32 kg/m   Physical Exam Vitals and nursing note reviewed.  Constitutional:      General: She is not in acute distress.    Appearance: She is well-developed.  HENT:     Head: Normocephalic and atraumatic.  Eyes:     Conjunctiva/sclera: Conjunctivae normal.  Cardiovascular:     Rate and Rhythm: Normal rate and regular rhythm.     Heart sounds: No murmur heard.   Pulmonary:     Effort: Pulmonary effort is normal. No respiratory distress.     Breath sounds: Normal breath sounds.  Abdominal:     Palpations: Abdomen is soft.     Tenderness: There is no abdominal tenderness.  Musculoskeletal:     Cervical back: Neck supple.     Comments: Tenderness to palpation across lower back, no step-off or deformity noted; no tenderness to palpation throughout right or left lower extremities, no deformity noted, normal pulses  Skin:    General: Skin is warm and dry.  Neurological:      General: No focal deficit present.     Mental Status: She is alert and oriented to person, place, and time.     Comments: 5 out of 5 strength in lower extremities, sensation to light touch intact in lower extremities  Psychiatric:        Mood and Affect: Mood normal.        Behavior: Behavior normal.     ED Results / Procedures / Treatments   Labs (all labs ordered are listed, but only abnormal results are displayed) Labs Reviewed  RESPIRATORY PANEL BY RT PCR (FLU A&B, COVID)  CBC WITH DIFFERENTIAL/PLATELET  BASIC METABOLIC PANEL    EKG None  Radiology  CT Lumbar Spine Wo Contrast  Result Date: 01/30/2020 CLINICAL DATA:  75 year old female status post trip and fall yesterday. Fell backwards onto buttocks 5 days ago. Low back pain. EXAM: CT LUMBAR SPINE WITHOUT CONTRAST TECHNIQUE: Multidetector CT imaging of the lumbar spine was performed without intravenous contrast administration. Multiplanar CT image reconstructions were also generated. COMPARISON:  CTA chest 06/20/2016. FINDINGS: Segmentation: Normal. Alignment: Mild to moderate levoconvex lumbar scoliosis with apex at L3-L4. Mild straightening of lumbar lordosis. Subtle degenerative retrolisthesis of L3 on L4. Vertebrae: Background osteopenia. Lumbar and visible lower thoracic vertebrae appear intact. However, there are subtle bilateral sacral ala fractures, minimally displaced (series 3, images 102, 105, 112). SI joints remain intact. Sagittal images suggest a mildly displaced central fracture of the S3 body also. Paraspinal and other soft tissues: Negative visible costophrenic angles. Aortoiliac calcified atherosclerosis. Negative other visible abdominal viscera. Negative lumbar paraspinal soft tissues. Trace presacral inflammatory stranding bilaterally. Disc levels: Negative from the visible lower thoracic spine through L2-L3. L3-L4: Moderate to severe disc space loss with circumferential but mostly far lateral disc osteophyte complex  greater on the right. Mild facet and ligament flavum hypertrophy. Mild if any spinal stenosis. Moderate to severe right L3 foraminal stenosis. L4-L5: Disc space loss with circumferential disc bulge and endplate spurring eccentric to the right. Mild to moderate facet and ligament flavum hypertrophy. Mild spinal stenosis suspected. Moderate right L4 foraminal stenosis. L5-S1: Disc space loss. Mild circumferential disc osteophyte complex eccentric to the left. Mild facet hypertrophy. Mild left L5 foraminal stenosis. IMPRESSION: 1. Osteopenia with minimally displaced bilateral sacral fractures. 2. No acute osseous abnormality in the lumbar spine. Levoconvex lumbar scoliosis with L3-L4 through L5-S1 degeneration. 3. Aortic Atherosclerosis (ICD10-I70.0). Electronically Signed   By: Genevie Ann M.D.   On: 01/30/2020 22:30    Procedures Procedures (including critical care time)  Medications Ordered in ED Medications  escitalopram (LEXAPRO) tablet 10 mg (has no administration in time range)  losartan (COZAAR) tablet 100 mg (has no administration in time range)  oxyCODONE-acetaminophen (PERCOCET/ROXICET) 5-325 MG per tablet 1 tablet (has no administration in time range)  fentaNYL (SUBLIMAZE) injection 50 mcg (50 mcg Intravenous Given 01/30/20 2142)    ED Course  I have reviewed the triage vital signs and the nursing notes.  Pertinent labs & imaging results that were available during my care of the patient were reviewed by me and considered in my medical decision making (see chart for details).    MDM Rules/Calculators/A&P                         74 year old lady who presented to the ER with concern for low back pain after recent fall.  Reviewed CT images myself. Per rad, CT spine concerning for nondisplaced bilateral sacral alar fractures, possibly mildly displaced central fracture of the S3 body also.  Suspect this is etiology of her pain.  Anticipate nonoperative management.  Would ordinarily recommend  outpatient Ortho follow-up.  However given difficulty with pain control and inability to ambulate, believe patient would benefit from pain control and inpatient PT OT eval.  Will consult [facility] hospitalist service for admission.  Final Clinical Impression(s) / ED Diagnoses Final diagnoses:  Closed fracture of sacrum, unspecified portion of sacrum, initial encounter [facility])    Rx / DC Orders ED Discharge Orders    None       Lucrezia Starch, MD 01/30/20 2345

## 2020-01-31 DIAGNOSIS — D631 Anemia in chronic kidney disease: Secondary | ICD-10-CM

## 2020-01-31 DIAGNOSIS — D509 Iron deficiency anemia, unspecified: Secondary | ICD-10-CM | POA: Diagnosis not present

## 2020-01-31 DIAGNOSIS — I10 Essential (primary) hypertension: Secondary | ICD-10-CM

## 2020-01-31 DIAGNOSIS — I160 Hypertensive urgency: Secondary | ICD-10-CM

## 2020-01-31 DIAGNOSIS — Z20822 Contact with and (suspected) exposure to covid-19: Secondary | ICD-10-CM | POA: Diagnosis not present

## 2020-01-31 DIAGNOSIS — N1831 Chronic kidney disease, stage 3a: Secondary | ICD-10-CM | POA: Diagnosis not present

## 2020-01-31 DIAGNOSIS — W1830XA Fall on same level, unspecified, initial encounter: Secondary | ICD-10-CM | POA: Diagnosis not present

## 2020-01-31 DIAGNOSIS — R55 Syncope and collapse: Secondary | ICD-10-CM

## 2020-01-31 DIAGNOSIS — I951 Orthostatic hypotension: Secondary | ICD-10-CM | POA: Diagnosis not present

## 2020-01-31 DIAGNOSIS — S3210XA Unspecified fracture of sacrum, initial encounter for closed fracture: Secondary | ICD-10-CM | POA: Diagnosis not present

## 2020-01-31 DIAGNOSIS — M545 Low back pain, unspecified: Secondary | ICD-10-CM | POA: Diagnosis present

## 2020-01-31 DIAGNOSIS — R32 Unspecified urinary incontinence: Secondary | ICD-10-CM | POA: Diagnosis not present

## 2020-01-31 DIAGNOSIS — Z96651 Presence of right artificial knee joint: Secondary | ICD-10-CM | POA: Diagnosis not present

## 2020-01-31 DIAGNOSIS — S3219XA Other fracture of sacrum, initial encounter for closed fracture: Secondary | ICD-10-CM | POA: Diagnosis not present

## 2020-01-31 DIAGNOSIS — I129 Hypertensive chronic kidney disease with stage 1 through stage 4 chronic kidney disease, or unspecified chronic kidney disease: Secondary | ICD-10-CM | POA: Diagnosis not present

## 2020-01-31 DIAGNOSIS — Y92008 Other place in unspecified non-institutional (private) residence as the place of occurrence of the external cause: Secondary | ICD-10-CM | POA: Diagnosis not present

## 2020-01-31 LAB — CBC WITH DIFFERENTIAL/PLATELET
Abs Immature Granulocytes: 0.04 10*3/uL (ref 0.00–0.07)
Basophils Absolute: 0 10*3/uL (ref 0.0–0.1)
Basophils Relative: 0 %
Eosinophils Absolute: 0.3 10*3/uL (ref 0.0–0.5)
Eosinophils Relative: 3 %
HCT: 30.5 % — ABNORMAL LOW (ref 36.0–46.0)
Hemoglobin: 10.6 g/dL — ABNORMAL LOW (ref 12.0–15.0)
Immature Granulocytes: 0 %
Lymphocytes Relative: 23 %
Lymphs Abs: 2.7 10*3/uL (ref 0.7–4.0)
MCH: 36.1 pg — ABNORMAL HIGH (ref 26.0–34.0)
MCHC: 34.8 g/dL (ref 30.0–36.0)
MCV: 103.7 fL — ABNORMAL HIGH (ref 80.0–100.0)
Monocytes Absolute: 1 10*3/uL (ref 0.1–1.0)
Monocytes Relative: 9 %
Neutro Abs: 7.5 10*3/uL (ref 1.7–7.7)
Neutrophils Relative %: 65 %
Platelets: 207 10*3/uL (ref 150–400)
RBC: 2.94 MIL/uL — ABNORMAL LOW (ref 3.87–5.11)
RDW: 16.2 % — ABNORMAL HIGH (ref 11.5–15.5)
WBC: 11.7 10*3/uL — ABNORMAL HIGH (ref 4.0–10.5)
nRBC: 0 % (ref 0.0–0.2)

## 2020-01-31 LAB — CBC
HCT: 29.5 % — ABNORMAL LOW (ref 36.0–46.0)
Hemoglobin: 10.4 g/dL — ABNORMAL LOW (ref 12.0–15.0)
MCH: 36.4 pg — ABNORMAL HIGH (ref 26.0–34.0)
MCHC: 35.3 g/dL (ref 30.0–36.0)
MCV: 103.1 fL — ABNORMAL HIGH (ref 80.0–100.0)
Platelets: 206 10*3/uL (ref 150–400)
RBC: 2.86 MIL/uL — ABNORMAL LOW (ref 3.87–5.11)
RDW: 15.7 % — ABNORMAL HIGH (ref 11.5–15.5)
WBC: 8.9 10*3/uL (ref 4.0–10.5)
nRBC: 0 % (ref 0.0–0.2)

## 2020-01-31 LAB — BASIC METABOLIC PANEL
Anion gap: 11 (ref 5–15)
Anion gap: 12 (ref 5–15)
BUN: 14 mg/dL (ref 8–23)
BUN: 15 mg/dL (ref 8–23)
CO2: 21 mmol/L — ABNORMAL LOW (ref 22–32)
CO2: 23 mmol/L (ref 22–32)
Calcium: 8.8 mg/dL — ABNORMAL LOW (ref 8.9–10.3)
Calcium: 9.1 mg/dL (ref 8.9–10.3)
Chloride: 103 mmol/L (ref 98–111)
Chloride: 103 mmol/L (ref 98–111)
Creatinine, Ser: 0.79 mg/dL (ref 0.44–1.00)
Creatinine, Ser: 0.96 mg/dL (ref 0.44–1.00)
GFR, Estimated: 59 mL/min — ABNORMAL LOW (ref >60)
GFR, Estimated: >60 mL/min (ref >60)
Glucose, Bld: 82 mg/dL (ref 70–99)
Glucose, Bld: 91 mg/dL (ref 70–99)
Potassium: 3.4 mmol/L — ABNORMAL LOW (ref 3.5–5.1)
Potassium: 4 mmol/L (ref 3.5–5.1)
Sodium: 136 mmol/L (ref 135–145)
Sodium: 137 mmol/L (ref 135–145)

## 2020-01-31 LAB — RESPIRATORY PANEL BY RT PCR (FLU A&B, COVID)
Influenza A by PCR: NEGATIVE
Influenza B by PCR: NEGATIVE
SARS Coronavirus 2 by RT PCR: NEGATIVE

## 2020-01-31 MED ORDER — ACETAMINOPHEN 325 MG PO TABS: 650.0000 mg | ORAL_TABLET | Freq: Four times a day (QID) | ORAL | Status: DC | PRN

## 2020-01-31 MED ORDER — POLYETHYLENE GLYCOL 3350 17 G PO PACK
17.0000 g | PACK | Freq: Every day | ORAL | Status: DC | PRN
  Administered 2020-01-31 – 2020-02-06 (×2): 17 g via ORAL
  Filled 2020-01-31 (×3): qty 1

## 2020-01-31 MED ORDER — ACETAMINOPHEN 650 MG RE SUPP: 650.0000 mg | Freq: Four times a day (QID) | RECTAL | Status: DC | PRN

## 2020-01-31 MED ORDER — IBUPROFEN 400 MG PO TABS
400.0000 mg | ORAL_TABLET | Freq: Four times a day (QID) | ORAL | Status: DC | PRN
  Administered 2020-02-01 – 2020-02-03 (×7): 400 mg via ORAL
  Filled 2020-01-31 (×7): qty 1

## 2020-01-31 MED ORDER — POTASSIUM CHLORIDE CRYS ER 10 MEQ PO TBCR
10.0000 meq | EXTENDED_RELEASE_TABLET | Freq: Once | ORAL | Status: AC
  Administered 2020-01-31: 10 meq via ORAL
  Filled 2020-01-31: qty 1

## 2020-01-31 MED ORDER — OXYCODONE-ACETAMINOPHEN 5-325 MG PO TABS
1.0000 | ORAL_TABLET | Freq: Four times a day (QID) | ORAL | Status: DC | PRN
  Administered 2020-01-31 – 2020-02-06 (×12): 1 via ORAL
  Filled 2020-01-31 (×12): qty 1

## 2020-01-31 MED ORDER — ENOXAPARIN SODIUM 40 MG/0.4ML ~~LOC~~ SOLN
40.0000 mg | SUBCUTANEOUS | Status: DC
  Administered 2020-01-31 – 2020-02-06 (×7): 40 mg via SUBCUTANEOUS
  Filled 2020-01-31 (×7): qty 0.4

## 2020-01-31 NOTE — ED Notes (Signed)
Report given to [facility]  

## 2020-01-31 NOTE — H&P (Signed)
History and Physical        Hospital Admission Note Date: 01/31/2020  Patient name: Mallory Murphy Medical record number: [id number] Date of birth: [birth year] Age: 74 y.o. Gender: female  PCP: Seward Carol, MD  Patient coming from: Home Lives with: alone At baseline, ambulates: independently  Chief Complaint    Chief Complaint  Patient presents with  . Back Pain      HPI:   This is a 74 year old female with past medical history of EPO and iron deficiency anemia, syncope, urinary incontinence, osteoarthritis, RA, DJD of cervical spine, hypertension who presented to [facility] on 01/30/20 for concerns of low back pain 5 days ago after she had a syncopal episode and landed on her buttock. She went to [facility] on 10/17. She had a CT head, xrays of her back and hip and reported that everything was negative and was kept in the ED overnight. Had repeat labs and PT/OT eval. Due to lack of adequate pain control and lack of seeing PT, patient left and went to [facility]. At [facility] she complained of 10/10 sharp low back pain with movement, improved with rest. The ED physician and radiologist reviewed the images and the CT spine was concerning for nondisplaced bilateral sacral alar fractures possibly mildly displaced central fracture of the S3 body also.  Given her difficult pain control and inability to ambulate, the patient was transferred to [facility] for PT/OT eval and pain control. Currently, she states that her pain is well controlled with the Percocet from this morning while at rest but has pain with any movement.  Regarding her syncopal episode, the patient states that she was getting out of the car and felt "noodly." Said that she was feeling diaphoretic and nauseas so she sat down on her family's step outside for a minute. She went to stand up and then immediately synopsized which lasted seconds. Says  this has happened in the past. No postictal episode, no chest pain, no palpitations, no other red flags.   ED Course: Afebrile hypertensive (198/86) and tolerating room air.  Notable labs: BUN 13, creatinine 0.96 (at baseline), WBC 11.7, Hb 10.6 (previously 12.1 on 10/7), COVID-19 negative.  CT lumbar spine: Osteopenia with minimally displaced bilateral sacral fractures.  She was given fentanyl and Percocet.  Vitals:   01/31/20 0754 01/31/20 0854  BP: (!) 182/80 (!) 198/86  Pulse: 82 78  Resp: 16 18  Temp:  98.2 F (36.8 C)  SpO2: 99% 100%     Review of Systems:  Review of Systems  Constitutional: Negative for chills and fever.  Respiratory: Negative for cough and shortness of breath.   Cardiovascular: Negative for chest pain, palpitations and leg swelling.  Gastrointestinal: Negative for nausea and vomiting.  Genitourinary: Negative.   Musculoskeletal: Positive for back pain and falls.  Neurological: Positive for loss of consciousness. Negative for seizures.  All other systems reviewed and are negative.   Medical/Social/Family History   Past Medical History: Past Medical History:  Diagnosis Date  . Anemia    hx blood tranfusions  . 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 Arthritis   . Diverticulosis   . DJD (degenerative joint  disease) of cervical spine   . 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 Hypertension   . Iron deficiency anemia due to chronic blood loss 11/19/2018  . Iron deficiency anemia due to chronic blood loss 11/19/2018    Past Surgical History:  Procedure Laterality Date  . ELBOW ARTHROPLASTY Right    X3   . HIP SURGERY    . JOINT REPLACEMENT     both hips  . 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 TOTAL SHOULDER ARTHROPLASTY Left      Medications: Prior to Admission medications   Medication Sig Start Date End Date Taking? Authorizing Provider  chlorhexidine (PERIDEX) 0.12 % solution chlorhexidine gluconate 0.12 % mouthwash  RM WITH 15 ML BID AND EXPECTORATE    [provider]  ciclopirox (PENLAC) 8 % solution ciclopirox 8 % topical solution  APPLY TO THE AFFECTED AREA DAILY. REMOVE WEEKLY WITH NAIL POLISH REMOVER AND REAPPLY.    [provider]  diphenhydrAMINE (BENADRYL) 25 mg capsule Take 25 mg by mouth at bedtime as needed.    [provider]  docusate sodium (COLACE) 100 MG capsule Take 100 mg by mouth daily as needed for mild constipation.    [provider]  escitalopram (LEXAPRO) 10 MG tablet Take 10 mg by mouth daily.    [provider]  Golimumab ([place] ARIA IV) Inject into the vein every 8 (eight) weeks. For RA [facility] Rhematology.    [provider]  losartan (COZAAR) 100 MG tablet 100 mg daily.    [provider]  oxybutynin (DITROPAN-XL) 5 MG 24 hr tablet Take 5 mg by mouth at bedtime.    [provider]  traMADol (ULTRAM) 50 MG tablet Take 1-2 tablets (50-100 mg total) by mouth every 6 (six) hours as needed for moderate pain. 05/27/16   Perkins, Alexzandrew L, PA-C    Allergies:   Allergies  Allergen Reactions  . Macrobid [Nitrofurantoin Macrocrystal] Nausea Only    Social History:  reports that she has never smoked. She has never used smokeless tobacco. She reports previous alcohol use. She reports that she does not use drugs.  Family History: No family history on file.   Objective   Physical Exam: Blood pressure (!) 198/86, pulse 78, temperature 98.2 F (36.8 C), resp. rate 18, height 5\' 1"  (1.549 m), weight 60.8 kg, SpO2 100 %.  Physical Exam Vitals and nursing note reviewed.  Constitutional:      Appearance: Normal appearance.     Comments: Lying flat and unable to sit up or move due to pain  HENT:      Head: Normocephalic and atraumatic.  Eyes:     Conjunctiva/sclera: Conjunctivae normal.  Cardiovascular:     Rate and Rhythm: Normal rate and regular rhythm.  Pulmonary:     Effort: Pulmonary effort is normal.     Breath sounds: Normal breath sounds.  Abdominal:     General: Abdomen is flat.     Palpations: Abdomen is soft.  Musculoskeletal:        General: No swelling or tenderness.  Skin:    Coloration: Skin is not jaundiced or pale.  Neurological:     Mental Status: She is alert. Mental status is at baseline.  Psychiatric:        Mood and Affect: Mood normal.        Behavior: Behavior normal.  LABS on Admission: I have personally reviewed all the labs and imaging below    Basic Metabolic Panel: Recent Labs  Lab 01/30/20 2352  NA 137  K 4.0  CL 103  CO2 23  GLUCOSE 82  BUN 15  CREATININE 0.96  CALCIUM 9.1   Liver Function Tests: No results for input(s): AST, ALT, ALKPHOS, BILITOT, PROT, ALBUMIN in the last 168 hours. No results for input(s): LIPASE, AMYLASE in the last 168 hours. No results for input(s): AMMONIA in the last 168 hours. CBC: Recent Labs  Lab 01/30/20 2352 01/30/20 2352 01/31/20 0919  WBC 11.7*  --  8.9  NEUTROABS 7.5  --   --   HGB 10.6*  --  10.4*  HCT 30.5*  --  29.5*  MCV 103.7*   < > 103.1*  PLT 207  --  206   < > = values in this interval not displayed.   Cardiac Enzymes: No results for input(s): CKTOTAL, CKMB, CKMBINDEX, TROPONINI in the last 168 hours. BNP: Invalid input(s): POCBNP CBG: No results for input(s): GLUCAP in the last 168 hours.  Radiological Exams on Admission:  CT Lumbar Spine Wo Contrast  Result Date: 01/30/2020 CLINICAL DATA:  74 year old female status post trip and fall yesterday. Fell backwards onto buttocks 5 days ago. Low back pain. EXAM: CT LUMBAR SPINE WITHOUT CONTRAST TECHNIQUE: Multidetector CT imaging of the lumbar spine was performed without intravenous contrast administration. Multiplanar CT image  reconstructions were also generated. COMPARISON:  CTA chest 06/20/2016. FINDINGS: Segmentation: Normal. Alignment: Mild to moderate levoconvex lumbar scoliosis with apex at L3-L4. Mild straightening of lumbar lordosis. Subtle degenerative retrolisthesis of L3 on L4. Vertebrae: Background osteopenia. Lumbar and visible lower thoracic vertebrae appear intact. However, there are subtle bilateral sacral ala fractures, minimally displaced (series 3, images 102, 105, 112). SI joints remain intact. Sagittal images suggest a mildly displaced central fracture of the S3 body also. Paraspinal and other soft tissues: Negative visible costophrenic angles. Aortoiliac calcified atherosclerosis. Negative other visible abdominal viscera. Negative lumbar paraspinal soft tissues. Trace presacral inflammatory stranding bilaterally. Disc levels: Negative from the visible lower thoracic spine through L2-L3. L3-L4: Moderate to severe disc space loss with circumferential but mostly far lateral disc osteophyte complex greater on the right. Mild facet and ligament flavum hypertrophy. Mild if any spinal stenosis. Moderate to severe right L3 foraminal stenosis. L4-L5: Disc space loss with circumferential disc bulge and endplate spurring eccentric to the right. Mild to moderate facet and ligament flavum hypertrophy. Mild spinal stenosis suspected. Moderate right L4 foraminal stenosis. L5-S1: Disc space loss. Mild circumferential disc osteophyte complex eccentric to the left. Mild facet hypertrophy. Mild left L5 foraminal stenosis. IMPRESSION: 1. Osteopenia with minimally displaced bilateral sacral fractures. 2. No acute osseous abnormality in the lumbar spine. Levoconvex lumbar scoliosis with L3-L4 through L5-S1 degeneration. 3. Aortic Atherosclerosis (ICD10-I70.0). Electronically Signed   By: Genevie Ann M.D.   On: 01/30/2020 22:30      EKG: Not done   A & P   Principal Problem:   Sacral fracture (HCC) Active Problems:   HTN  (hypertension)   Syncope, vasovagal   Erythropoietin deficiency anemia   Hypertensive urgency   1. Bilateral sacral fractures s/p fall from standing height after syncopal episode a. Pain regimen: Mild pain -Tylenol, Moderate - Ibuprofen, Severe - Percocet, Breakthrough - Morphine b. PT/OT c. Outpatient ortho follow up  2. Syncope, likely vasovagal and/or orthostasis a. Has a history of syncope with similar symptoms of diaphoresis, nausea, etc. In  the setting of Oxybutynin XL and Benadryl use b. Hold oxybutynin and benadryl  c. Check orthostatics when able to stand up d. Check an EKG  3. Hypertensive Urgency  Hypertension a. Restart home Losartan  4. Urinary incontinence a. Hold home meds as above b. Outpatient follow up  5. EPO and Iron deficiency anemia, at/near baseline a. Follows with hematology outpatient  6. CKD 3a a. Creatinine elevated but not AKI and returned to baseline yesterday, labs pending from today   DVT prophylaxis: lovenox   Code Status: Full Code  Diet: regular Family Communication: Admission, patients condition and plan of care including tests being ordered have been discussed with the patient who indicates understanding and agrees with the plan and Code Status.  Disposition Plan: The appropriate patient status for this patient is OBSERVATION. Observation status is judged to be reasonable and necessary in order to provide the required intensity of service to ensure the patient's safety. The patient's presenting symptoms, physical exam findings, and initial radiographic and laboratory data in the context of their medical condition is felt to place them at decreased risk for further clinical deterioration. Furthermore, it is anticipated that the patient will be medically stable for discharge from the hospital within 2 midnights of admission. The following factors support the patient status of observation.   " The patient's presenting symptoms include syncope,  back pain. " The physical exam findings include back pain. " The initial radiographic and laboratory data are concerning for sacral fracture.    Status is: Observation  The patient remains OBS appropriate and will d/c before 2 midnights.  Dispo: The patient is from: Home              Anticipated d/c is to: Home              Anticipated d/c date is: 1 day              Patient currently is not medically stable to d/c.    Consultants  . none  Procedures  . none  Time Spent on Admission: 55 minutes    Harold Hedge, DO [facility]  01/31/2020, 9:59 AM

## 2020-01-31 NOTE — Evaluation (Signed)
Physical Therapy Evaluation Patient Details Name: Mallory Murphy MRN: [id number] DOB: 07-25-1945 Today's Date: 01/31/2020   History of Present Illness  74 year old female with past medical history of EPO and iron deficiency anemia, syncope, urinary incontinence, osteoarthritis, RA, DJD of cervical spine, hypertension who presented to [facility] on 01/30/20 for concerns of low back pain 5 days ago after she had a syncopal episode and landed on her buttock. She went to [place] on 10/17. She had a CT head, xrays of her back and hip and reported that everything was negative and was kept in the ED overnight. Had repeat labs and PT/OT eval. Due to lack of adequate pain control and lack of seeing PT, patient left and went to [facility]. At [facility] she complained of 10/10 sharp low back pain with movement, improved with rest. The ED physician and radiologist reviewed the images and the CT spine was concerning for nondisplaced bilateral sacral alar fractures possibly mildly displaced central fracture of the S3 body also.  Given her difficult pain control and inability to ambulate, the patient was transferred to [facility] for PT/OT eval and pain control  Clinical Impression  Pt admitted with above diagnosis.  Pt currently with functional limitations due to the deficits listed below (see PT Problem List). Pt will benefit from skilled PT to increase their independence and safety with mobility to allow discharge to the venue listed below.  Pt agreeable to attempt mobility however unable to tolerate sitting position and requested back to bed.  Pt very determined to d/c back home and inquiring about more home care.  RN notified of pain and to bring pain meds.  Anticipate pt to mobilize better once pain improved.     Follow Up Recommendations Home health PT;Supervision/Assistance - 24 hour    Equipment Recommendations  None recommended by PT    Recommendations for Other Services       Precautions / Restrictions  Precautions Precautions: Fall Restrictions Other Position/Activity Restrictions: WBAT      Mobility  Bed Mobility Overal bed mobility: Needs Assistance Bed Mobility: Supine to Sit;Sit to Supine     Supine to sit: Mod assist Sit to supine: Total assist   General bed mobility comments: assist for trunk upright, pt with increased pain with sitting, attempted to scoot to EOB and not able to tolerate further activity; assisted back to bed and repositioned to comfort  Transfers                    Ambulation/Gait                Stairs            Wheelchair Mobility    Modified Rankin (Stroke Patients Only)       Balance                                             Pertinent Vitals/Pain Pain Assessment: 0-10 Pain Score: 10-Worst pain ever Pain Location: sacral area with sitting Pain Descriptors / Indicators: Sore;Grimacing;Discomfort;Guarding Pain Intervention(s): Monitored during session;Repositioned;Patient requesting pain meds-RN notified    Home Living Family/patient expects to be discharged to:: Private residence Living Arrangements: Alone Available Help at Discharge: Family;Available PRN/intermittently Type of Home: House Home Access: Level entry     Home Layout: One level Home Equipment: Walker - 2 wheels;Walker - 4 wheels;Shower seat  Prior Function Level of Independence: Independent               Hand Dominance        Extremity/Trunk Assessment   Upper Extremity Assessment Upper Extremity Assessment: LUE deficits/detail;RUE deficits/detail RUE Deficits / Details: hx right elbow sx x3- decreased motion and strength LUE Deficits / Details: hx of left TSA    Lower Extremity Assessment Lower Extremity Assessment: Generalized weakness       Communication   Communication: No difficulties  Cognition Arousal/Alertness: Awake/alert Behavior During Therapy: WFL for tasks assessed/performed Overall  Cognitive Status: Within Functional Limits for tasks assessed                                        General Comments General comments (skin integrity, edema, etc.): Pt reports being cautious of falls however states falls lately are due to syncope    Exercises     Assessment/Plan    PT Assessment Patient needs continued PT services  PT Problem List Decreased strength;Decreased mobility;Decreased balance;Decreased knowledge of use of DME;Pain       PT Treatment Interventions DME instruction;Gait training;Balance training;Therapeutic exercise;Functional mobility training;Therapeutic activities;Patient/family education    PT Goals (Current goals can be found in the Care Plan section)  Acute Rehab PT Goals PT Goal Formulation: With patient Time For Goal Achievement: 02/07/20 Potential to Achieve Goals: Good    Frequency Min 5X/week   Barriers to discharge        Co-evaluation               AM-PAC PT "6 Clicks" Mobility  Outcome Measure Help needed turning from your back to your side while in a flat bed without using bedrails?: A Lot Help needed moving from lying on your back to sitting on the side of a flat bed without using bedrails?: A Lot Help needed moving to and from a bed to a chair (including a wheelchair)?: A Lot Help needed standing up from a chair using your arms (e.g., wheelchair or bedside chair)?: A Lot Help needed to walk in hospital room?: A Lot Help needed climbing 3-5 steps with a railing? : Total 6 Click Score: 11    End of Session Equipment Utilized During Treatment: Gait belt Activity Tolerance: Patient limited by pain Patient left: in bed;with call bell/phone within reach;with bed alarm set Nurse Communication: Mobility status;Patient requests pain meds PT Visit Diagnosis: Difficulty in walking, not elsewhere classified (R26.2)    Time: 1027-2536 PT Time Calculation (min) (ACUTE ONLY): 17 min   Charges:   PT Evaluation $PT  Eval Low Complexity: 1 Low     Kati PT, DPT Acute Rehabilitation Services Pager: [phone] Office: [phone]  York Ram E 01/31/2020, 3:04 PM

## 2020-02-01 DIAGNOSIS — S3210XA Unspecified fracture of sacrum, initial encounter for closed fracture: Secondary | ICD-10-CM | POA: Diagnosis not present

## 2020-02-01 LAB — CBC
HCT: 29.5 % — ABNORMAL LOW (ref 36.0–46.0)
Hemoglobin: 10 g/dL — ABNORMAL LOW (ref 12.0–15.0)
MCH: 35.2 pg — ABNORMAL HIGH (ref 26.0–34.0)
MCHC: 33.9 g/dL (ref 30.0–36.0)
MCV: 103.9 fL — ABNORMAL HIGH (ref 80.0–100.0)
Platelets: 233 10*3/uL (ref 150–400)
RBC: 2.84 MIL/uL — ABNORMAL LOW (ref 3.87–5.11)
RDW: 15.7 % — ABNORMAL HIGH (ref 11.5–15.5)
WBC: 8.3 10*3/uL (ref 4.0–10.5)
nRBC: 0 % (ref 0.0–0.2)

## 2020-02-01 LAB — BASIC METABOLIC PANEL
Anion gap: 10 (ref 5–15)
BUN: 17 mg/dL (ref 8–23)
CO2: 24 mmol/L (ref 22–32)
Calcium: 8.4 mg/dL — ABNORMAL LOW (ref 8.9–10.3)
Chloride: 99 mmol/L (ref 98–111)
Creatinine, Ser: 1.15 mg/dL — ABNORMAL HIGH (ref 0.44–1.00)
GFR, Estimated: 47 mL/min — ABNORMAL LOW (ref >60)
Glucose, Bld: 97 mg/dL (ref 70–99)
Potassium: 3.9 mmol/L (ref 3.5–5.1)
Sodium: 133 mmol/L — ABNORMAL LOW (ref 135–145)

## 2020-02-01 LAB — MAGNESIUM: Magnesium: 1.7 mg/dL (ref 1.7–2.4)

## 2020-02-01 LAB — PHOSPHORUS: Phosphorus: 3.6 mg/dL (ref 2.5–4.6)

## 2020-02-01 MED ORDER — FENTANYL CITRATE (PF) 100 MCG/2ML IJ SOLN
INTRAMUSCULAR | Status: AC
Start: 2020-02-01 — End: 2020-02-01
  Administered 2020-02-01: 50 ug
  Filled 2020-02-01: qty 2

## 2020-02-01 MED ORDER — HYDRALAZINE HCL 20 MG/ML IJ SOLN
10.0000 mg | Freq: Four times a day (QID) | INTRAMUSCULAR | Status: DC | PRN
  Administered 2020-02-01 – 2020-02-02 (×3): 10 mg via INTRAVENOUS
  Filled 2020-02-01 (×4): qty 1

## 2020-02-01 MED ORDER — TRAMADOL HCL 50 MG PO TABS: 50.0000 mg | ORAL_TABLET | Freq: Four times a day (QID) | ORAL | 0 refills | Status: AC | PRN

## 2020-02-01 MED ORDER — SODIUM CHLORIDE 0.9 % IV SOLN: INTRAVENOUS | Status: DC

## 2020-02-01 MED ORDER — FENTANYL CITRATE (PF) 100 MCG/2ML IJ SOLN: 50.0000 ug | Freq: Once | INTRAMUSCULAR | Status: AC

## 2020-02-01 NOTE — Progress Notes (Signed)
Patient is alert and oriented fully and sitting up in bed in stable condition. Patient states pain level of 4 out of 10 to sacral area.  Patient states pain is better when she is not moving around and is not positioned sitting completely up.  Pure wick output showing 100 ml. Patient refusing breakfast. Encouraging patient to eat applesauce and drink water to assist with urine output.

## 2020-02-01 NOTE — Progress Notes (Signed)
PROGRESS NOTE    Mallory Murphy  CWC:[id number] DOB: 1946/01/20 DOA: 01/30/2020 PCP: Seward Carol, MD   Brief Narrative: 74 year old female with past medical history of EPO and iron deficiency anemia,syncope, urinary incontinence,osteoarthritis, RA, DJD of cervical spine,hypertension who presented to [facility] on 01/30/20 for concerns of low back pain 5 days ago after she had a syncopal episode and landed on her buttock. She went to [facility] on 10/17. She had a CT head, xrays of her back and hip and reported that everything was negative and was kept in the ED overnight. Had repeat labs and PT/OT eval. Due to lack of adequate pain control and lack of seeing PT, patient left and went to [facility]. At [facility] she complained of 10/10 sharp low back pain with movement, improved with rest. The ED physician and radiologist reviewed the images and the CT spine was concerning for nondisplaced bilateral sacral alar fractures possibly mildly displaced central fracture of the S3 body also. Given her difficult pain control and inability to ambulate, the patient was transferred to [facility] for PT/OT eval and pain control. Currently, she states that her pain is well controlled with the Percocet from this morning while at rest but has pain with any movement.  Regarding her syncopal episode, the patient states that she was getting out of the car and felt "noodly." Said that she was feeling diaphoretic and nauseas so she sat down on her family's step outside for a minute. She went to stand up and then immediately synopsized which lasted seconds. Says this has happened in the past. No postictal episode, no chest pain, no palpitations, no other red flags.    Assessment & Plan:   Principal Problem:   Sacral fracture (HCC) Active Problems:   HTN (hypertension)   Syncope, vasovagal   Erythropoietin deficiency anemia   Hypertensive urgency  #1 status post fall bilateral sacral fractures-patient is clearly orthostatic with  her blood pressure dropping to 88/53 on standing for 3 minutes from 155/59.  Will start her on normal saline 150 cc an hour.  Patient lives at home alone.  She was unable to stand by the side of the bed even with physical therapy.  She is an unsafe discharge to home.  Continue to hold Benadryl oxybutynin.  TED hose ordered.  Await PT recommendations.  EKG showed no acute changes.  #2 hypertension continue home dose of losartan.  #3 CKD stage IIIa patient received IV fluids during the hospital stay.  Creatinine on discharge was 1.15  #4 iron deficiency anemia at baseline  #5 urinary incontinence oxybutynin is on hold at the time of discharge.    Estimated body mass index is 25.32 kg/m as calculated from the following:   Height as of this encounter: 5\' 1"  (1.549 m).   Weight as of this encounter: 60.8 kg.  DVT prophylaxis: Lovenox  code Status: Full code  family Communication: Discussed with patient's niece disposition Plan:  Status is: Observation   Dispo: The patient is from: Home              Anticipated d/c is to:   Await PT recommendations from today.            Anticipated d/c date is: 2 days              Patient currently is not medically stable to d/c.  Patient very orthostatic with unsteady gait with fall.  She is an unsafe discharge to home.    Consultants:   None  Procedures: None Antimicrobials: None  Subjective: She is resting in bed awake and alert denies any chest pain shortness of breath headache changes nutrition nausea vomiting abdominal pain or urinary complaints.  Objective: Vitals:   02/01/20 0441 02/01/20 0632 02/01/20 0934 02/01/20 1000  BP: (!) 170/67 (!) 168/82 (!) 188/72 (!) 190/82  Pulse: 80 85 81   Resp:  15  16  Temp:  98 F (36.7 C) 98.1 F (36.7 C)   TempSrc:  Oral    SpO2:  97% 98%   Weight:      Height:        Intake/Output Summary (Last 24 hours) at 02/01/2020 1157 Last data filed at 02/01/2020 1145 Gross per 24 hour  Intake  550 ml  Output 1100 ml  Net -550 ml   Filed Weights   01/30/20 1716  Weight: 60.8 kg    Examination:  General exam: Appears calm and comfortable  Respiratory system: Clear to auscultation. Respiratory effort normal. Cardiovascular system: S1 & S2 heard, RRR. No JVD, murmurs, rubs, gallops or clicks. No pedal edema. Gastrointestinal system: Abdomen is nondistended, soft and nontender. No organomegaly or masses felt. Normal bowel sounds heard. Central nervous system: Alert and oriented. No focal neurological deficits. Extremities: Symmetric 5 x 5 power. Skin: No rashes, lesions or ulcers Psychiatry: Judgement and insight appear normal. Mood & affect appropriate.     Data Reviewed: I have personally reviewed following labs and imaging studies  CBC: Recent Labs  Lab 01/30/20 2352 01/31/20 0919 02/01/20 0338  WBC 11.7* 8.9 8.3  NEUTROABS 7.5  --   --   HGB 10.6* 10.4* 10.0*  HCT 30.5* 29.5* 29.5*  MCV 103.7* 103.1* 103.9*  PLT 207 206 174   Basic Metabolic Panel: Recent Labs  Lab 01/30/20 2352 01/31/20 0919 02/01/20 0338 02/01/20 0352  NA 137 136 133*  --   K 4.0 3.4* 3.9  --   CL 103 103 99  --   CO2 23 21* 24  --   GLUCOSE 82 91 97  --   BUN 15 14 17   --   CREATININE 0.96 0.79 1.15*  --   CALCIUM 9.1 8.8* 8.4*  --   MG  --   --   --  1.7  PHOS  --   --   --  3.6   GFR: Estimated Creatinine Clearance: 36.5 mL/min (A) (by C-G formula based on SCr of 1.15 mg/dL (H)). Liver Function Tests: No results for input(s): AST, ALT, ALKPHOS, BILITOT, PROT, ALBUMIN in the last 168 hours. No results for input(s): LIPASE, AMYLASE in the last 168 hours. No results for input(s): AMMONIA in the last 168 hours. Coagulation Profile: No results for input(s): INR, PROTIME in the last 168 hours. Cardiac Enzymes: No results for input(s): CKTOTAL, CKMB, CKMBINDEX, TROPONINI in the last 168 hours. BNP (last 3 results) No results for input(s): PROBNP in the last 8760  hours. HbA1C: No results for input(s): HGBA1C in the last 72 hours. CBG: No results for input(s): GLUCAP in the last 168 hours. Lipid Profile: No results for input(s): CHOL, HDL, LDLCALC, TRIG, CHOLHDL, LDLDIRECT in the last 72 hours. Thyroid Function Tests: No results for input(s): TSH, T4TOTAL, FREET4, T3FREE, THYROIDAB in the last 72 hours. Anemia Panel: No results for input(s): VITAMINB12, FOLATE, FERRITIN, TIBC, IRON, RETICCTPCT in the last 72 hours. Sepsis Labs: No results for input(s): PROCALCITON, LATICACIDVEN in the last 168 hours.  Recent Results (from the past 240 hour(s))  Respiratory Panel by RT  PCR (Flu A&B, Covid) - Nasopharyngeal Swab     Status: None   Collection Time: 01/30/20 11:52 PM   Specimen: Nasopharyngeal Swab  Result Value Ref Range Status   SARS Coronavirus 2 by RT PCR NEGATIVE NEGATIVE Final    Comment: (NOTE) SARS-CoV-2 target nucleic acids are NOT DETECTED.  The SARS-CoV-2 RNA is generally detectable in upper respiratoy specimens during the acute phase of infection. The lowest concentration of SARS-CoV-2 viral copies this assay can detect is 131 copies/mL. A negative result does not preclude SARS-Cov-2 infection and should not be used as the sole basis for treatment or other patient management decisions. A negative result may occur with  improper specimen collection/handling, submission of specimen other than nasopharyngeal swab, presence of viral mutation(s) within the areas targeted by this assay, and inadequate number of viral copies (<131 copies/mL). A negative result must be combined with clinical observations, patient history, and epidemiological information. The expected result is Negative.  Fact Sheet for Patients:  PinkCheek.be  Fact Sheet for Healthcare Providers:  GravelBags.it  This test is no t yet approved or cleared by the [place] FDA and  has been authorized for  detection and/or diagnosis of SARS-CoV-2 by FDA under an Emergency Use Authorization (EUA). This EUA will remain  in effect (meaning this test can be used) for the duration of the COVID-19 declaration under Section 564(b)(1) of the Act, 21 U.S.C. section 360bbb-3(b)(1), unless the authorization is terminated or revoked sooner.     Influenza A by PCR NEGATIVE NEGATIVE Final   Influenza B by PCR NEGATIVE NEGATIVE Final    Comment: (NOTE) The Xpert Xpress SARS-CoV-2/FLU/RSV assay is intended as an aid in  the diagnosis of influenza from Nasopharyngeal swab specimens and  should not be used as a sole basis for treatment. Nasal washings and  aspirates are unacceptable for Xpert Xpress SARS-CoV-2/FLU/RSV  testing.  Fact Sheet for Patients: PinkCheek.be  Fact Sheet for Healthcare Providers: GravelBags.it  This test is not yet approved or cleared by the [place] FDA and  has been authorized for detection and/or diagnosis of SARS-CoV-2 by  FDA under an Emergency Use Authorization (EUA). This EUA will remain  in effect (meaning this test can be used) for the duration of the  Covid-19 declaration under Section 564(b)(1) of the Act, 21  U.S.C. section 360bbb-3(b)(1), unless the authorization is  terminated or revoked. Performed at [facility], [address], [place], [place] [address]          Radiology Studies: CT Lumbar Spine Wo Contrast  Result Date: 01/30/2020 CLINICAL DATA:  74 year old female status post trip and fall yesterday. Fell backwards onto buttocks 5 days ago. Low back pain. EXAM: CT LUMBAR SPINE WITHOUT CONTRAST TECHNIQUE: Multidetector CT imaging of the lumbar spine was performed without intravenous contrast administration. Multiplanar CT image reconstructions were also generated. COMPARISON:  CTA chest 06/20/2016. FINDINGS: Segmentation: Normal. Alignment: Mild to moderate levoconvex lumbar  scoliosis with apex at L3-L4. Mild straightening of lumbar lordosis. Subtle degenerative retrolisthesis of L3 on L4. Vertebrae: Background osteopenia. Lumbar and visible lower thoracic vertebrae appear intact. However, there are subtle bilateral sacral ala fractures, minimally displaced (series 3, images 102, 105, 112). SI joints remain intact. Sagittal images suggest a mildly displaced central fracture of the S3 body also. Paraspinal and other soft tissues: Negative visible costophrenic angles. Aortoiliac calcified atherosclerosis. Negative other visible abdominal viscera. Negative lumbar paraspinal soft tissues. Trace presacral inflammatory stranding bilaterally. Disc levels: Negative from the visible  lower thoracic spine through L2-L3. L3-L4: Moderate to severe disc space loss with circumferential but mostly far lateral disc osteophyte complex greater on the right. Mild facet and ligament flavum hypertrophy. Mild if any spinal stenosis. Moderate to severe right L3 foraminal stenosis. L4-L5: Disc space loss with circumferential disc bulge and endplate spurring eccentric to the right. Mild to moderate facet and ligament flavum hypertrophy. Mild spinal stenosis suspected. Moderate right L4 foraminal stenosis. L5-S1: Disc space loss. Mild circumferential disc osteophyte complex eccentric to the left. Mild facet hypertrophy. Mild left L5 foraminal stenosis. IMPRESSION: 1. Osteopenia with minimally displaced bilateral sacral fractures. 2. No acute osseous abnormality in the lumbar spine. Levoconvex lumbar scoliosis with L3-L4 through L5-S1 degeneration. 3. Aortic Atherosclerosis (ICD10-I70.0). Electronically Signed   By: Genevie Ann M.D.   On: 01/30/2020 22:30        Scheduled Meds:  enoxaparin (LOVENOX) injection  40 mg Subcutaneous Q24H   escitalopram  10 mg Oral Daily   losartan  100 mg Oral Daily   Continuous Infusions:  sodium chloride 150 mL/hr at 02/01/20 1015     LOS: 0 days     Georgette Shell, MD  02/01/2020, 11:57 AM

## 2020-02-01 NOTE — Progress Notes (Signed)
Client is sitting up in bed in stable condition.  Per nurse previous IV removed due to no longer being patent.  Writer nurse attempted to locate new IV site without success.  Patient unable to tolerate tourniquet to right arm to access for new IV site.  Will notify assigned nurse IV team may be needed.  Ted hose applied to bilateral lower extremities as ordered and tolerated well.

## 2020-02-01 NOTE — Discharge Summary (Signed)
Physician Discharge Summary  Mallory Murphy VQQ:[id number] DOB: [birth year] DOA: 01/30/2020  PCP: Seward Carol, MD  Admit date: 01/30/2020 Discharge date: 02/01/2020  Admitted From: Home Disposition: Home Recommendations for Outpatient Follow-up:  1. Follow up with PCP in 1-2 weeks 2. Please obtain BMP/CBC in one week 3. Please follow up with orthopedics in 2 to 4 weeks  Home Health PT and OT  equipment/Devices: None Discharge Condition stable CODE STATUS full code Diet recommendation: Cardiac diet Brief/Interim Summary:74 year old female with past medical history of EPO and iron deficiency anemia, syncope, urinary incontinence, osteoarthritis, RA, DJD of cervical spine, hypertension who presented to [facility] on 01/30/20 for concerns of low back pain 5 days ago after she had a syncopal episode and landed on her buttock. She went to [facility] on 10/17. She had a CT head, xrays of her back and hip and reported that everything was negative and was kept in the ED overnight. Had repeat labs and PT/OT eval. Due to lack of adequate pain control and lack of seeing PT, patient left and went to [facility]. At [facility] she complained of 10/10 sharp low back pain with movement, improved with rest. The ED physician and radiologist reviewed the images and the CT spine was concerning for nondisplaced bilateral sacral alar fractures possibly mildly displaced central fracture of the S3 body also.  Given her difficult pain control and inability to ambulate, the patient was transferred to [facility] for PT/OT eval and pain control. Currently, she states that her pain is well controlled with the Percocet from this morning while at rest but has pain with any movement.  Regarding her syncopal episode, the patient states that she was getting out of the car and felt "noodly." Said that she was feeling diaphoretic and nauseas so she sat down on her family's step outside for a minute. She went to stand up and then immediately synopsized  which lasted seconds. Says this has happened in the past. No postictal episode, no chest pain, no palpitations, no other red flags.    Discharge Diagnoses:  Principal Problem:   Sacral fracture ([place]) Active Problems:   HTN (hypertension)   Syncope, vasovagal   Erythropoietin deficiency anemia   Hypertensive urgency    #1 status post fall bilateral sacral fractures-it was suspected that she was probably orthostatic in the setting of oxybutynin use and Benadryl use.  These were held during the hospital stay. Ultram on discharge for pain control. Home health PT OT on discharge. She received normal saline during the hospital stay. EKG showed no acute changes.  #2 hypertension continue home dose of losartan.  #3 CKD stage IIIa patient received IV fluids during the hospital stay.  Creatinine on discharge was 1.15  #4 iron deficiency anemia at baseline  #5 urinary incontinence oxybutynin is on hold at the time of discharge.  Estimated body mass index is 25.32 kg/m as calculated from the following:   Height as of this encounter: 5\' 1"  (1.549 m).   Weight as of this encounter: 60.8 kg.  Discharge Instructions  Discharge Instructions    Diet - low sodium heart healthy   Complete by: As directed    Increase activity slowly   Complete by: As directed      Allergies as of 02/01/2020      Reactions   Macrobid [nitrofurantoin Macrocrystal] Nausea Only      Medication List    STOP taking these medications   oxybutynin 5 MG 24 hr tablet Commonly known as: DITROPAN-XL  TAKE these medications   acetaminophen 500 MG tablet Commonly known as: TYLENOL Take 500-1,000 mg by mouth every 6 (six) hours as needed for mild pain, fever or headache.   escitalopram 10 MG tablet Commonly known as: LEXAPRO Take 10 mg by mouth daily.   losartan 100 MG tablet Commonly known as: COZAAR Take 100 mg by mouth daily.   [place] ARIA IV Inject into the vein every 8 (eight) weeks. For RA [facility].   traMADol 50 MG tablet Commonly known as: Ultram Take 1 tablet (50 mg total) by mouth every 6 (six) hours as needed. What changed:   how much to take  reasons to take this       Follow-up Information    Seward Carol, MD Follow up.   Specialty: Internal Medicine Contact information: [address] [place] [address] [phone]        Tania Ade, MD Follow up.   Specialty: Orthopedic Surgery Contact information: [place] [address] [phone]              Allergies  Allergen Reactions  . Macrobid [Nitrofurantoin Macrocrystal] Nausea Only    Consultations:  None   Procedures/Studies: CT Lumbar Spine Wo Contrast  Result Date: 01/30/2020 CLINICAL DATA:  74 year old female status post trip and fall yesterday. Fell backwards onto buttocks 5 days ago. Low back pain. EXAM: CT LUMBAR SPINE WITHOUT CONTRAST TECHNIQUE: Multidetector CT imaging of the lumbar spine was performed without intravenous contrast administration. Multiplanar CT image reconstructions were also generated. COMPARISON:  CTA chest 06/20/2016. FINDINGS: Segmentation: Normal. Alignment: Mild to moderate levoconvex lumbar scoliosis with apex at L3-L4. Mild straightening of lumbar lordosis. Subtle degenerative retrolisthesis of L3 on L4. Vertebrae: Background osteopenia. Lumbar and visible lower thoracic vertebrae appear intact. However, there are subtle bilateral sacral ala fractures, minimally displaced (series 3, images 102, 105, 112). SI joints remain intact. Sagittal images suggest a mildly displaced central fracture of the S3 body also. Paraspinal and other soft tissues: Negative visible costophrenic angles. Aortoiliac calcified atherosclerosis. Negative other visible abdominal viscera. Negative lumbar paraspinal soft tissues. Trace presacral inflammatory stranding bilaterally. Disc levels: Negative from the visible lower  thoracic spine through L2-L3. L3-L4: Moderate to severe disc space loss with circumferential but mostly far lateral disc osteophyte complex greater on the right. Mild facet and ligament flavum hypertrophy. Mild if any spinal stenosis. Moderate to severe right L3 foraminal stenosis. L4-L5: Disc space loss with circumferential disc bulge and endplate spurring eccentric to the right. Mild to moderate facet and ligament flavum hypertrophy. Mild spinal stenosis suspected. Moderate right L4 foraminal stenosis. L5-S1: Disc space loss. Mild circumferential disc osteophyte complex eccentric to the left. Mild facet hypertrophy. Mild left L5 foraminal stenosis. IMPRESSION: 1. Osteopenia with minimally displaced bilateral sacral fractures. 2. No acute osseous abnormality in the lumbar spine. Levoconvex lumbar scoliosis with L3-L4 through L5-S1 degeneration. 3. Aortic Atherosclerosis (ICD10-I70.0). Electronically Signed   By: Genevie Ann M.D.   On: 01/30/2020 22:30    (Echo, Carotid, EGD, Colonoscopy, ERCP)    Subjective: Patient resting in bed complains of pain when moving otherwise no complaints took MiraLAX had a bowel movement  Discharge Exam: Vitals:   02/01/20 0632 02/01/20 0934  BP: (!) 168/82 (!) 188/72  Pulse: 85 81  Resp: 15   Temp: 98 F (36.7 C) 98.1 F (36.7 C)  SpO2: 97% 98%   Vitals:   02/01/20 0403 02/01/20 0441 02/01/20 9211 02/01/20 9417  BP: (!) 191/73 (!) 170/67 (!) 168/82 (!) 188/72  Pulse: 69 80 85 81  Resp:   15   Temp:   98 F (36.7 C) 98.1 F (36.7 C)  TempSrc:   Oral   SpO2:   97% 98%  Weight:      Height:        General: Pt is alert, awake, not in acute distress Cardiovascular: RRR, S1/S2 +, no rubs, no gallops Respiratory: CTA bilaterally, no wheezing, no rhonchi Abdominal: Soft, NT, ND, bowel sounds + Extremities: no edema, no cyanosis    The results of significant diagnostics from this hospitalization (including imaging, microbiology, ancillary and laboratory)  are listed below for reference.     Microbiology: Recent Results (from the past 240 hour(s))  Respiratory Panel by RT PCR (Flu A&B, Covid) - Nasopharyngeal Swab     Status: None   Collection Time: 01/30/20 11:52 PM   Specimen: Nasopharyngeal Swab  Result Value Ref Range Status   SARS Coronavirus 2 by RT PCR NEGATIVE NEGATIVE Final    Comment: (NOTE) SARS-CoV-2 target nucleic acids are NOT DETECTED.  The SARS-CoV-2 RNA is generally detectable in upper respiratoy specimens during the acute phase of infection. The lowest concentration of SARS-CoV-2 viral copies this assay can detect is 131 copies/mL. A negative result does not preclude SARS-Cov-2 infection and should not be used as the sole basis for treatment or other patient management decisions. A negative result may occur with  improper specimen collection/handling, submission of specimen other than nasopharyngeal swab, presence of viral mutation(s) within the areas targeted by this assay, and inadequate number of viral copies (<131 copies/mL). A negative result must be combined with clinical observations, patient history, and epidemiological information. The expected result is Negative.  Fact Sheet for Patients:  PinkCheek.be  Fact Sheet for Healthcare Providers:  GravelBags.it  This test is no t yet approved or cleared by the [place] FDA and  has been authorized for detection and/or diagnosis of SARS-CoV-2 by FDA under an Emergency Use Authorization (EUA). This EUA will remain  in effect (meaning this test can be used) for the duration of the COVID-19 declaration under Section 564(b)(1) of the Act, 21 U.S.C. section 360bbb-3(b)(1), unless the authorization is terminated or revoked sooner.     Influenza A by PCR NEGATIVE NEGATIVE Final   Influenza B by PCR NEGATIVE NEGATIVE Final    Comment: (NOTE) The Xpert Xpress SARS-CoV-2/FLU/RSV assay is intended as an  aid in  the diagnosis of influenza from Nasopharyngeal swab specimens and  should not be used as a sole basis for treatment. Nasal washings and  aspirates are unacceptable for Xpert Xpress SARS-CoV-2/FLU/RSV  testing.  Fact Sheet for Patients: PinkCheek.be  Fact Sheet for Healthcare Providers: GravelBags.it  This test is not yet approved or cleared by the [place] FDA and  has been authorized for detection and/or diagnosis of SARS-CoV-2 by  FDA under an Emergency Use Authorization (EUA). This EUA will remain  in effect (meaning this test can be used) for the duration of the  Covid-19 declaration under Section 564(b)(1) of the Act, 21  U.S.C. section 360bbb-3(b)(1), unless the authorization is  terminated or revoked. Performed at [facility], [address] [place], [place] [address]      Labs: BNP (last 3 results) No results for input(s): BNP in the last 8760 hours. Basic Metabolic Panel: Recent Labs  Lab 01/30/20 2352 01/31/20 0919 02/01/20 0338 02/01/20 0352  NA 137 136 133*  --  K 4.0 3.4* 3.9  --   CL 103 103 99  --   CO2 23 21* 24  --   GLUCOSE 82 91 97  --   BUN 15 14 17   --   CREATININE 0.96 0.79 1.15*  --   CALCIUM 9.1 8.8* 8.4*  --   MG  --   --   --  1.7  PHOS  --   --   --  3.6   Liver Function Tests: No results for input(s): AST, ALT, ALKPHOS, BILITOT, PROT, ALBUMIN in the last 168 hours. No results for input(s): LIPASE, AMYLASE in the last 168 hours. No results for input(s): AMMONIA in the last 168 hours. CBC: Recent Labs  Lab 01/30/20 2352 01/31/20 0919 02/01/20 0338  WBC 11.7* 8.9 8.3  NEUTROABS 7.5  --   --   HGB 10.6* 10.4* 10.0*  HCT 30.5* 29.5* 29.5*  MCV 103.7* 103.1* 103.9*  PLT 207 206 233   Cardiac Enzymes: No results for input(s): CKTOTAL, CKMB, CKMBINDEX, TROPONINI in the last 168 hours. BNP: Invalid input(s): POCBNP CBG: No results for input(s):  GLUCAP in the last 168 hours. D-Dimer No results for input(s): DDIMER in the last 72 hours. Hgb A1c No results for input(s): HGBA1C in the last 72 hours. Lipid Profile No results for input(s): CHOL, HDL, LDLCALC, TRIG, CHOLHDL, LDLDIRECT in the last 72 hours. Thyroid function studies No results for input(s): TSH, T4TOTAL, T3FREE, THYROIDAB in the last 72 hours.  Invalid input(s): FREET3 Anemia work up No results for input(s): VITAMINB12, FOLATE, FERRITIN, TIBC, IRON, RETICCTPCT in the last 72 hours. Urinalysis    Component Value Date/Time   COLORURINE YELLOW 05/17/2013 1129   APPEARANCEUR CLEAR 05/17/2013 1129   LABSPEC 1.017 05/17/2013 1129   PHURINE 5.5 05/17/2013 1129   GLUCOSEU NEGATIVE 05/17/2013 1129   HGBUR TRACE (A) 05/17/2013 1129   BILIRUBINUR NEGATIVE 05/17/2013 1129   KETONESUR 15 (A) 05/17/2013 1129   PROTEINUR NEGATIVE 05/17/2013 1129   UROBILINOGEN 1.0 05/17/2013 1129   NITRITE NEGATIVE 05/17/2013 1129   LEUKOCYTESUR MODERATE (A) 05/17/2013 1129   Sepsis Labs Invalid input(s): PROCALCITONIN,  WBC,  LACTICIDVEN Microbiology Recent Results (from the past 240 hour(s))  Respiratory Panel by RT PCR (Flu A&B, Covid) - Nasopharyngeal Swab     Status: None   Collection Time: 01/30/20 11:52 PM   Specimen: Nasopharyngeal Swab  Result Value Ref Range Status   SARS Coronavirus 2 by RT PCR NEGATIVE NEGATIVE Final    Comment: (NOTE) SARS-CoV-2 target nucleic acids are NOT DETECTED.  The SARS-CoV-2 RNA is generally detectable in upper respiratoy specimens during the acute phase of infection. The lowest concentration of SARS-CoV-2 viral copies this assay can detect is 131 copies/mL. A negative result does not preclude SARS-Cov-2 infection and should not be used as the sole basis for treatment or other patient management decisions. A negative result may occur with  improper specimen collection/handling, submission of specimen other than nasopharyngeal swab, presence of  viral mutation(s) within the areas targeted by this assay, and inadequate number of viral copies (<131 copies/mL). A negative result must be combined with clinical observations, patient history, and epidemiological information. The expected result is Negative.  Fact Sheet for Patients:  PinkCheek.be  Fact Sheet for Healthcare Providers:  GravelBags.it  This test is no t yet approved or cleared by the [place] FDA and  has been authorized for detection and/or diagnosis of SARS-CoV-2 by FDA under an Emergency Use Authorization (EUA). This EUA will remain  in effect (meaning this test can be used) for the duration of the COVID-19 declaration under Section 564(b)(1) of the Act, 21 U.S.C. section 360bbb-3(b)(1), unless the authorization is terminated or revoked sooner.     Influenza A by PCR NEGATIVE NEGATIVE Final   Influenza B by PCR NEGATIVE NEGATIVE Final    Comment: (NOTE) The Xpert Xpress SARS-CoV-2/FLU/RSV assay is intended as an aid in  the diagnosis of influenza from Nasopharyngeal swab specimens and  should not be used as a sole basis for treatment. Nasal washings and  aspirates are unacceptable for Xpert Xpress SARS-CoV-2/FLU/RSV  testing.  Fact Sheet for Patients: PinkCheek.be  Fact Sheet for Healthcare Providers: GravelBags.it  This test is not yet approved or cleared by the [place] FDA and  has been authorized for detection and/or diagnosis of SARS-CoV-2 by  FDA under an Emergency Use Authorization (EUA). This EUA will remain  in effect (meaning this test can be used) for the duration of the  Covid-19 declaration under Section 564(b)(1) of the Act, 21  U.S.C. section 360bbb-3(b)(1), unless the authorization is  terminated or revoked. Performed at [facility], [address], [place], [place] [address]      Time coordinating  discharge: 38 minutes  SIGNED:   Georgette Shell, MD  [facility] 02/01/2020, 9:34 AM

## 2020-02-01 NOTE — Progress Notes (Signed)
Physical Therapy Treatment Patient Details Name: Mallory Murphy MRN: [id number] DOB: [birth year] Today's Date: 02/01/2020    History of Present Illness 74 year old female with past medical history of EPO and iron deficiency anemia, syncope, urinary incontinence, osteoarthritis, RA, DJD of cervical spine, hypertension who presented to [facility] on 01/30/20 for concerns of low back pain 5 days ago after she had a syncopal episode and landed on her buttock. She went to [facility] on 10/17. She had a CT head, xrays of her back and hip and reported that everything was negative and was kept in the ED overnight. Had repeat labs and PT/OT eval. Due to lack of adequate pain control and lack of seeing PT, patient left and went to [facility]. At [facility] she complained of 10/10 sharp low back pain with movement, improved with rest. The ED physician and radiologist reviewed the images and the CT spine was concerning for nondisplaced bilateral sacral alar fractures possibly mildly displaced central fracture of the S3 body also.  Given her difficult pain control and inability to ambulate, the patient was transferred to [facility] for PT/OT eval and pain control    PT Comments    Pt assisted with mobility to obtain orthostatics which were positive.  Pt also reports dizziness with standing which did not resolve.  Pt does report pain improved with premedication today.  Pt reports not having assist at home and agreeable to SNF for rehab.    02/01/20 1100  Orthostatic Lying   BP- Lying 155/59  Pulse- Lying 91  Orthostatic Sitting  BP- Sitting 136/65  Pulse- Sitting 97  Orthostatic Standing at 0 minutes  BP- Standing at 0 minutes 115/48  Pulse- Standing at 0 minutes 98  Orthostatic Standing at 3 minutes  BP- Standing at 3 minutes (!) 88/53  Pulse- Standing at 3 minutes 89     Follow Up Recommendations  Supervision/Assistance - 24 hour;SNF     Equipment Recommendations  None recommended by PT    Recommendations for Other  Services       Precautions / Restrictions Precautions Precautions: Fall Restrictions Weight Bearing Restrictions: No Other Position/Activity Restrictions: WBAT    Mobility  Bed Mobility Overal bed mobility: Needs Assistance Bed Mobility: Supine to Sit;Sit to Supine     Supine to sit: Min assist Sit to supine: Min assist   General bed mobility comments: assist for trunk upright and LEs onto bed  Transfers Overall transfer level: Needs assistance Equipment used: Rolling walker (2 wheeled) Transfers: Sit to/from Stand Sit to Stand: Min guard         General transfer comment: verbal cues for safe technique, pt dizzy with standing and this did not resolve, able to obtain orthostatics with RN and then pt requested to return to bed due to dizziness (also orthostatics positive)  Ambulation/Gait                 Stairs             Wheelchair Mobility    Modified Rankin (Stroke Patients Only)       Balance                                            Cognition Arousal/Alertness: Awake/alert Behavior During Therapy: WFL for tasks assessed/performed Overall Cognitive Status: Within Functional Limits for tasks assessed  Exercises      General Comments        Pertinent Vitals/Pain Pain Assessment: 0-10 Pain Score: 3  Pain Location: sacral area Pain Descriptors / Indicators: Sore;Grimacing;Discomfort;Guarding Pain Intervention(s): Premedicated before session;Repositioned    Home Living                      Prior Function            PT Goals (current goals can now be found in the care plan section) Progress towards PT goals: Progressing toward goals    Frequency    Min 3X/week      PT Plan Discharge plan needs to be updated;Frequency needs to be updated    Co-evaluation              AM-PAC PT "6 Clicks" Mobility   Outcome Measure  Help needed  turning from your back to your side while in a flat bed without using bedrails?: A Lot Help needed moving from lying on your back to sitting on the side of a flat bed without using bedrails?: A Lot Help needed moving to and from a bed to a chair (including a wheelchair)?: A Little Help needed standing up from a chair using your arms (e.g., wheelchair or bedside chair)?: A Little Help needed to walk in hospital room?: A Lot Help needed climbing 3-5 steps with a railing? : Total 6 Click Score: 13    End of Session Equipment Utilized During Treatment: Gait belt Activity Tolerance: Treatment limited secondary to medical complications (Comment) (drop in BP) Patient left: in bed;with call bell/phone within reach;with bed alarm set;with nursing/sitter in room Nurse Communication: Mobility status PT Visit Diagnosis: Difficulty in walking, not elsewhere classified (R26.2)     Time: 7282-0601 PT Time Calculation (min) (ACUTE ONLY): 20 min  Charges:  $Therapeutic Activity: 8-22 mins                    Jannette Spanner PT, DPT Acute Rehabilitation Services Pager: [phone] Office: [phone]  York Ram E 02/01/2020, 12:01 PM

## 2020-02-01 NOTE — Progress Notes (Signed)
Current BP 195/79, HR 72. Paged floor coverage to request  PRN BP medication.

## 2020-02-02 DIAGNOSIS — I951 Orthostatic hypotension: Secondary | ICD-10-CM | POA: Diagnosis present

## 2020-02-02 DIAGNOSIS — Z96651 Presence of right artificial knee joint: Secondary | ICD-10-CM | POA: Diagnosis present

## 2020-02-02 DIAGNOSIS — S3210XA Unspecified fracture of sacrum, initial encounter for closed fracture: Secondary | ICD-10-CM | POA: Diagnosis not present

## 2020-02-02 DIAGNOSIS — S3219XA Other fracture of sacrum, initial encounter for closed fracture: Secondary | ICD-10-CM | POA: Diagnosis present

## 2020-02-02 DIAGNOSIS — Y92008 Other place in unspecified non-institutional (private) residence as the place of occurrence of the external cause: Secondary | ICD-10-CM | POA: Diagnosis not present

## 2020-02-02 DIAGNOSIS — D509 Iron deficiency anemia, unspecified: Secondary | ICD-10-CM | POA: Diagnosis present

## 2020-02-02 DIAGNOSIS — D631 Anemia in chronic kidney disease: Secondary | ICD-10-CM | POA: Diagnosis present

## 2020-02-02 DIAGNOSIS — R32 Unspecified urinary incontinence: Secondary | ICD-10-CM | POA: Diagnosis present

## 2020-02-02 DIAGNOSIS — N1831 Chronic kidney disease, stage 3a: Secondary | ICD-10-CM | POA: Diagnosis present

## 2020-02-02 DIAGNOSIS — I160 Hypertensive urgency: Secondary | ICD-10-CM | POA: Diagnosis present

## 2020-02-02 DIAGNOSIS — W1830XA Fall on same level, unspecified, initial encounter: Secondary | ICD-10-CM | POA: Diagnosis present

## 2020-02-02 DIAGNOSIS — I129 Hypertensive chronic kidney disease with stage 1 through stage 4 chronic kidney disease, or unspecified chronic kidney disease: Secondary | ICD-10-CM | POA: Diagnosis present

## 2020-02-02 DIAGNOSIS — M545 Low back pain, unspecified: Secondary | ICD-10-CM | POA: Diagnosis present

## 2020-02-02 DIAGNOSIS — Z20822 Contact with and (suspected) exposure to covid-19: Secondary | ICD-10-CM | POA: Diagnosis present

## 2020-02-02 MED ORDER — ALUM & MAG HYDROXIDE-SIMETH 200-200-20 MG/5ML PO SUSP
30.0000 mL | ORAL | Status: DC | PRN
  Administered 2020-02-02: 30 mL via ORAL
  Filled 2020-02-02: qty 30

## 2020-02-02 MED ORDER — ALPRAZOLAM 0.25 MG PO TABS
0.2500 mg | ORAL_TABLET | Freq: Once | ORAL | Status: AC
  Administered 2020-02-02: 0.25 mg via ORAL
  Filled 2020-02-02: qty 1

## 2020-02-02 MED ORDER — HYDRALAZINE HCL 20 MG/ML IJ SOLN
10.0000 mg | Freq: Once | INTRAMUSCULAR | Status: AC
  Administered 2020-02-02: 10 mg via INTRAVENOUS

## 2020-02-02 NOTE — Progress Notes (Addendum)
PROGRESS NOTE    Mallory Murphy  DEY:[id number] DOB: 30-Apr-1945 DOA: 01/30/2020 PCP: Seward Carol, MD   Brief Narrative: 74 year old female with past medical history of EPO and iron deficiency anemia,syncope, urinary incontinence,osteoarthritis, RA, DJD of cervical spine,hypertension who presented to [facility] on 01/30/20 for concerns of low back pain 5 days ago after she had a syncopal episode and landed on her buttock. She went to [facility] on 10/17. She had a CT head, xrays of her back and hip and reported that everything was negative and was kept in the ED overnight. Had repeat labs and PT/OT eval. Due to lack of adequate pain control and lack of seeing PT, patient left and went to [facility]. At [facility] she complained of 10/10 sharp low back pain with movement, improved with rest. The ED physician and radiologist reviewed the images and the CT spine was concerning for nondisplaced bilateral sacral alar fractures possibly mildly displaced central fracture of the S3 body also. Given her difficult pain control and inability to ambulate, the patient was transferred to [facility] for PT/OT eval and pain control. Currently, she states that her pain is well controlled with the Percocet from this morning while at rest but has pain with any movement.  Regarding her syncopal episode, the patient states that she was getting out of the car and felt "noodly." Said that she was feeling diaphoretic and nauseas so she sat down on her family's step outside for a minute. She went to stand up and then immediately synopsized which lasted seconds. Says this has happened in the past. No postictal episode, no chest pain, no palpitations, no other red flags.    Assessment & Plan:   Principal Problem:   Sacral fracture (HCC) Active Problems:   HTN (hypertension)   Syncope, vasovagal   Erythropoietin deficiency anemia   Hypertensive urgency  #1 status post fall bilateral sacral fractures-patient remains orthostatic dropping  her blood pressure to 88/53 from 184/93.  Continue normal saline.  TED hoses applied.  She lives at home alone.  She was seen by PT recommending SNF.  Prior to admission patient was able to ambulate without much difficulty.  Need to hold Benadryl oxybutynin. EKG showed no acute changes.  #2 hypertension continue home dose of losartan.  #3 CKD stage IIIa patient received IV fluids during the hospital stay.  Creatinine on discharge was 1.15  #4 iron deficiency anemia at baseline  #5 urinary incontinence oxybutynin is on hold at the time of discharge.  #6 orthostatic hypotension continue NS.    Estimated body mass index is 25.32 kg/m as calculated from the following:   Height as of this encounter: 5\' 1"  (1.549 m).   Weight as of this encounter: 60.8 kg.  DVT prophylaxis: Lovenox  code Status: Full code  family Communication: Discussed with patient's niece disposition Plan:  Status is inpatient  Dispo: The patient is from: Home              Anticipated d/c is to:  Skilled nursing facility for rehabilitation           Anticipated d/c date is: 2 days              Patient currently is not medically stable to d/c.  Patient very orthostatic with unsteady gait with fall.  She is an unsafe discharge to home.  Case manager consulted for SNF.    Consultants:   None  Procedures: None Antimicrobials: None  Subjective: Patient continues to complain of dizziness on  standing and walking she is able to move all her extremities no dysphagia dysphagia no chest pain shortness of breath nausea vomiting diarrhea headaches or blurry vision or double vision reported  Objective: Vitals:   02/01/20 1300 02/01/20 1415 02/01/20 2200 02/02/20 0524  BP: (!) 122/50 (!) 135/54 (!) 190/79 (!) 184/93  Pulse: 88 80 84 93  Resp: 16 20 16 16   Temp: 98.2 F (36.8 C) 97.9 F (36.6 C) 98.1 F (36.7 C) 97.8 F (36.6 C)  TempSrc: Oral  Oral Oral  SpO2: 99% 100% 99% 99%  Weight:      Height:         Intake/Output Summary (Last 24 hours) at 02/02/2020 1054 Last data filed at 02/02/2020 1011 Gross per 24 hour  Intake 3140.49 ml  Output 1650 ml  Net 1490.49 ml   Filed Weights   01/30/20 1716  Weight: 60.8 kg    Examination:  General exam: Appears calm and comfortable  Respiratory system: Clear to auscultation. Respiratory effort normal. Cardiovascular system: S1 & S2 heard, RRR. No JVD, murmurs, rubs, gallops or clicks. No pedal edema. Gastrointestinal system: Abdomen is nondistended, soft and nontender. No organomegaly or masses felt. Normal bowel sounds heard. Central nervous system: Alert and oriented. No focal neurological deficits. Extremities: Symmetric 5 x 5 power. Skin: No rashes, lesions or ulcers Psychiatry: Judgement and insight appear normal. Mood & affect appropriate.     Data Reviewed: I have personally reviewed following labs and imaging studies  CBC: Recent Labs  Lab 01/30/20 2352 01/31/20 0919 02/01/20 0338  WBC 11.7* 8.9 8.3  NEUTROABS 7.5  --   --   HGB 10.6* 10.4* 10.0*  HCT 30.5* 29.5* 29.5*  MCV 103.7* 103.1* 103.9*  PLT 207 206 696   Basic Metabolic Panel: Recent Labs  Lab 01/30/20 2352 01/31/20 0919 02/01/20 0338 02/01/20 0352  NA 137 136 133*  --   K 4.0 3.4* 3.9  --   CL 103 103 99  --   CO2 23 21* 24  --   GLUCOSE 82 91 97  --   BUN 15 14 17   --   CREATININE 0.96 0.79 1.15*  --   CALCIUM 9.1 8.8* 8.4*  --   MG  --   --   --  1.7  PHOS  --   --   --  3.6   GFR: Estimated Creatinine Clearance: 36.5 mL/min (A) (by C-G formula based on SCr of 1.15 mg/dL (H)). Liver Function Tests: No results for input(s): AST, ALT, ALKPHOS, BILITOT, PROT, ALBUMIN in the last 168 hours. No results for input(s): LIPASE, AMYLASE in the last 168 hours. No results for input(s): AMMONIA in the last 168 hours. Coagulation Profile: No results for input(s): INR, PROTIME in the last 168 hours. Cardiac Enzymes: No results for input(s): CKTOTAL,  CKMB, CKMBINDEX, TROPONINI in the last 168 hours. BNP (last 3 results) No results for input(s): PROBNP in the last 8760 hours. HbA1C: No results for input(s): HGBA1C in the last 72 hours. CBG: No results for input(s): GLUCAP in the last 168 hours. Lipid Profile: No results for input(s): CHOL, HDL, LDLCALC, TRIG, CHOLHDL, LDLDIRECT in the last 72 hours. Thyroid Function Tests: No results for input(s): TSH, T4TOTAL, FREET4, T3FREE, THYROIDAB in the last 72 hours. Anemia Panel: No results for input(s): VITAMINB12, FOLATE, FERRITIN, TIBC, IRON, RETICCTPCT in the last 72 hours. Sepsis Labs: No results for input(s): PROCALCITON, LATICACIDVEN in the last 168 hours.  Recent Results (from the past 240  hour(s))  Respiratory Panel by RT PCR (Flu A&B, Covid) - Nasopharyngeal Swab     Status: None   Collection Time: 01/30/20 11:52 PM   Specimen: Nasopharyngeal Swab  Result Value Ref Range Status   SARS Coronavirus 2 by RT PCR NEGATIVE NEGATIVE Final    Comment: (NOTE) SARS-CoV-2 target nucleic acids are NOT DETECTED.  The SARS-CoV-2 RNA is generally detectable in upper respiratoy specimens during the acute phase of infection. The lowest concentration of SARS-CoV-2 viral copies this assay can detect is 131 copies/mL. A negative result does not preclude SARS-Cov-2 infection and should not be used as the sole basis for treatment or other patient management decisions. A negative result may occur with  improper specimen collection/handling, submission of specimen other than nasopharyngeal swab, presence of viral mutation(s) within the areas targeted by this assay, and inadequate number of viral copies (<131 copies/mL). A negative result must be combined with clinical observations, patient history, and epidemiological information. The expected result is Negative.  Fact Sheet for Patients:  PinkCheek.be  Fact Sheet for Healthcare Providers:   GravelBags.it  This test is no t yet approved or cleared by the [place] FDA and  has been authorized for detection and/or diagnosis of SARS-CoV-2 by FDA under an Emergency Use Authorization (EUA). This EUA will remain  in effect (meaning this test can be used) for the duration of the COVID-19 declaration under Section 564(b)(1) of the Act, 21 U.S.C. section 360bbb-3(b)(1), unless the authorization is terminated or revoked sooner.     Influenza A by PCR NEGATIVE NEGATIVE Final   Influenza B by PCR NEGATIVE NEGATIVE Final    Comment: (NOTE) The Xpert Xpress SARS-CoV-2/FLU/RSV assay is intended as an aid in  the diagnosis of influenza from Nasopharyngeal swab specimens and  should not be used as a sole basis for treatment. Nasal washings and  aspirates are unacceptable for Xpert Xpress SARS-CoV-2/FLU/RSV  testing.  Fact Sheet for Patients: PinkCheek.be  Fact Sheet for Healthcare Providers: GravelBags.it  This test is not yet approved or cleared by the [place] FDA and  has been authorized for detection and/or diagnosis of SARS-CoV-2 by  FDA under an Emergency Use Authorization (EUA). This EUA will remain  in effect (meaning this test can be used) for the duration of the  Covid-19 declaration under Section 564(b)(1) of the Act, 21  U.S.C. section 360bbb-3(b)(1), unless the authorization is  terminated or revoked. Performed at [facility], [address], [place], [place] [address]          Radiology Studies: No results found.      Scheduled Meds:  enoxaparin (LOVENOX) injection  40 mg Subcutaneous Q24H   escitalopram  10 mg Oral Daily   losartan  100 mg Oral Daily   Continuous Infusions:  sodium chloride 150 mL/hr at 02/02/20 0600     LOS: 0 days     Georgette Shell, MD  02/02/2020, 10:54 AM

## 2020-02-02 NOTE — TOC Initial Note (Addendum)
Transition of Care [facility]) - Initial/Assessment Note    Patient Details  Name: Denetra Formoso MRN: [id number] Date of Birth: 07/27/1945  Transition of Care [facility]) CM/SW Contact:    Lia Hopping, [place] Phone Number: 02/02/2020, 4:37 PM  Clinical Narrative:          Patient admitted for back pain and was found to have bilateral sacral fractures. Patient seem by PT recommendation for SNF.          CSW met with th patient and her niece at bedside to discuss SNF placement. Patient lives alone and has not been able to stand without physical support from staff. Patient reports it has been hard to ambulate due to the pain. Patient reports prior to her syncope fall she was independent with ADL's and still drives. Patient reports she has never been to a facility but has had family members that have went to snf facilities. CSW explain the SNF process and will follow up with bed offers.  CSW explain Medicare requirements for SNF rehab benefits. Patient reports understanding  FL2 completed.  Expected Discharge Plan: Skilled Nursing Facility Barriers to Discharge: Continued Medical Work up   Patient Goals and CMS Choice   CMS Medicare.gov Compare Post Acute Care list provided to:: Other (Comment Required) Choice offered to / list presented to : Patient  Expected Discharge Plan and Services Expected Discharge Plan: [place] In-house Referral: Clinical Social Work Discharge Planning Services: CM Consult Post Acute Care Choice: [place] arrangements for the past 2 months: [place] Expected Discharge Date: 02/01/20                                    Prior Living Arrangements/Services Living arrangements for the past 2 months: Single Family Home Lives with:: Self Patient language and need for interpreter reviewed:: No Do you feel safe going back to the place where you live?: Yes      Need for Family Participation in Patient Care:  Yes (Comment) Care giver support system in place?: Yes (comment)   Criminal Activity/Legal Involvement Pertinent to Current Situation/Hospitalization: No - Comment as needed  Activities of Daily Living Home Assistive Devices/Equipment: None ADL Screening (condition at time of admission) Patient's cognitive ability adequate to safely complete daily activities?: Yes Is the patient deaf or have difficulty hearing?: No Does the patient have difficulty seeing, even when wearing glasses/contacts?: No Does the patient have difficulty concentrating, remembering, or making decisions?: No Patient able to express need for assistance with ADLs?: Yes Does the patient have difficulty dressing or bathing?: Yes Independently performs ADLs?: No Communication: Independent Dressing (OT): Needs assistance Is this a change from baseline?: Change from baseline, expected to last >3 days Grooming: Needs assistance Is this a change from baseline?: Change from baseline, expected to last >3 days Feeding: Needs assistance Is this a change from baseline?: Change from baseline, expected to last >3 days Bathing: Needs assistance Is this a change from baseline?: Change from baseline, expected to last >3 days Toileting: Dependent Is this a change from baseline?: Change from baseline, expected to last >3days In/Out Bed: Dependent Is this a change from baseline?: Change from baseline, expected to last >3 days Walks in Home: Dependent Is this a change from baseline?: Change from baseline, expected to last >3 days Does the patient have difficulty walking or climbing stairs?: Yes (secondary to sacral fracture) Weakness of Legs:  Both Weakness of Arms/Hands: None  Permission Sought/Granted Permission sought to share information with : Family Supports Permission granted to share information with : Yes, Verbal Permission Granted     Permission granted to share info w AGENCY: SNF's in the area        Emotional  Assessment Appearance:: Appears stated age Attitude/Demeanor/Rapport: Engaged Affect (typically observed): Pleasant Orientation: : Oriented to Self, Oriented to Place, Oriented to  Time, Oriented to Situation Alcohol / Substance Use: Not Applicable Psych Involvement: No (comment)  Admission diagnosis:  Sacral fracture (HCC) [S32.10XA] Closed fracture of sacrum, unspecified portion of sacrum, initial encounter ([place]) [S32.10XA] Patient Active Problem List   Diagnosis Date Noted  . Sacral fracture ([place]) 01/31/2020  . Hypertensive urgency 01/31/2020  . 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 Iron deficiency anemia due to chronic blood loss 11/19/2018  . OA (osteoarthritis) of knee 05/26/2016  . AKI (acute kidney injury) ([place]) 06/21/2014  . Dehydration 06/21/2014  . Syncope, vasovagal 06/21/2014  . Hypokalemia 06/21/2014  . Palpitation 06/21/2014  . HTN (hypertension) 05/07/2012  . Arthritis, rheumatoid ([place]) 05/07/2012   PCP:  Seward Carol, MD Pharmacy:   [id number] - [place], [place] - [address] AT [place] [address] [place] [address] Phone: [phone] Fax: [phone]     Social Determinants of Health (SDOH) Interventions    Readmission Risk Interventions No flowsheet data found.

## 2020-02-02 NOTE — NC FL2 (Addendum)
[place] LEVEL OF CARE SCREENING TOOL     IDENTIFICATION  Patient Name: Mallory Murphy Birthdate: 1945-10-30 Sex: female Admission Date (Current Location): 01/30/2020  [facility] and [place] Number:  Herbalist and Address:  [facility],  [place], [place]      Provider Number: [id number]  Attending Physician Name and Address:  Georgette Shell, MD  Relative Name and Phone Number:  Wright,[name] Niece [phone]  [phone]    Current Level of Care: Hospital Recommended Level of Care: [place] Prior Approval Number:    Date Approved/Denied:   PASRR Number:    [id number]  Discharge Plan: SNF    Current Diagnoses: Patient Active Problem List   Diagnosis Date Noted   Sacral fracture ([place]) 01/31/2020   Hypertensive urgency 01/31/2020   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, stage 3 (moderate) ([place]) 01/20/2019   Iron deficiency anemia due to chronic blood loss 11/19/2018   OA (osteoarthritis) of knee 05/26/2016   AKI (acute kidney injury) ([place]) 06/21/2014   Dehydration 06/21/2014   Syncope, vasovagal 06/21/2014   Hypokalemia 06/21/2014   Palpitation 06/21/2014   HTN (hypertension) 05/07/2012   Arthritis, rheumatoid ([place]) 05/07/2012    Orientation RESPIRATION BLADDER Height & Weight     Self, Time, Situation, Place  Normal Continent Weight: 134 lb (60.8 kg) Height:  5\' 1"  (154.9 cm)  BEHAVIORAL SYMPTOMS/MOOD NEUROLOGICAL BOWEL NUTRITION STATUS      Continent Diet Heart Healthy   AMBULATORY STATUS COMMUNICATION OF NEEDS Skin   Extensive Assist Verbally Normal                       Personal Care Assistance Level of Assistance  Bathing, Feeding, Dressing Bathing Assistance: Maximum assistance Feeding assistance: Independent Dressing Assistance: Maximum assistance     Functional Limitations  Info  Sight, Hearing, Speech Sight Info: Impaired (Wears Glasses) Hearing Info: Adequate Speech Info: Adequate    SPECIAL CARE FACTORS FREQUENCY  PT (By licensed PT), OT (By licensed OT)     PT Frequency: 5x/week OT Frequency: 5x/week            Contractures Contractures Info: Not present    Additional Factors Info  Code Status, Allergies, Psychotropic Code Status Info: Fullcode Allergies Info: Allergies: Macrobid Nitrofurantoin Macrocrystal Psychotropic Info: lexapro         Current Medications (02/02/2020):  This is the current hospital active medication list Current Facility-Administered Medications  Medication Dose Route Frequency Provider Last Rate Last Admin   0.9 %  sodium chloride infusion   Intravenous Continuous Georgette Shell, MD 150 mL/hr at 02/02/20 1140 New Bag at 02/02/20 1140   acetaminophen (TYLENOL) tablet 650 mg  650 mg Oral Q6H PRN Harold Hedge, MD       Or   acetaminophen (TYLENOL) suppository 650 mg  650 mg Rectal Q6H PRN Harold Hedge, MD       alum & mag hydroxide-simeth (MAALOX/MYLANTA) 200-200-20 MG/5ML suspension 30 mL  30 mL Oral Q4H PRN Georgette Shell, MD   30 mL at 02/02/20 0040   enoxaparin (LOVENOX) injection 40 mg  40 mg Subcutaneous Q24H Harold Hedge, MD   40 mg at 02/02/20 0957   escitalopram (LEXAPRO) tablet 10 mg  10 mg Oral Daily Harold Hedge, MD   10 mg at 02/02/20 0958   hydrALAZINE (APRESOLINE) injection 10 mg  10  mg Intravenous Q6H PRN Hollace Hayward K, NP   10 mg at 02/01/20 2251   ibuprofen (ADVIL) tablet 400 mg  400 mg Oral Q6H PRN Harold Hedge, MD   400 mg at 02/02/20 0957   losartan (COZAAR) tablet 100 mg  100 mg Oral Daily Harold Hedge, MD   100 mg at 02/02/20 0957   oxyCODONE-acetaminophen (PERCOCET/ROXICET) 5-325 MG per tablet 1 tablet  1 tablet Oral Q6H PRN Harold Hedge, MD   1 tablet at 02/02/20 0040   polyethylene glycol (MIRALAX / GLYCOLAX) packet 17 g  17 g Oral Daily PRN Harold Hedge, MD   17 g at 01/31/20 1442     Discharge Medications: Please see discharge summary for a list of discharge medications.  Relevant Imaging Results:  Relevant Lab Results:   Additional Information [id number]  Lia Hopping, LCSW

## 2020-02-02 NOTE — Plan of Care (Signed)
Plan of care reviewed and discussed with the patient. 

## 2020-02-03 DIAGNOSIS — S3210XA Unspecified fracture of sacrum, initial encounter for closed fracture: Secondary | ICD-10-CM | POA: Diagnosis not present

## 2020-02-03 MED ORDER — AMLODIPINE BESYLATE 5 MG PO TABS
5.0000 mg | ORAL_TABLET | Freq: Every day | ORAL | Status: DC
  Administered 2020-02-03 – 2020-02-06 (×4): 5 mg via ORAL
  Filled 2020-02-03 (×4): qty 1

## 2020-02-03 NOTE — TOC Progression Note (Signed)
Transition of Care [facility]) - Progression Note    Patient Details  Name: Mallory Murphy MRN: [id number] Date of Birth: 05-Jun-1945  Transition [facility]) CM/SW [place], [place] Phone Number: 02/03/2020, 3:09 PM  Clinical Narrative:    CSW provided the list of SNF bed offers.  Patient and her niece [name],niece will complete admission paperwork Patient has been vaccinated however will need a covid test prior to discharge.    Expected Discharge Plan: [place] Barriers to Discharge: Continued Medical Work up  Expected Discharge Plan and Services Expected Discharge Plan: [place] In-house Referral: Clinical Social Work Discharge Planning Services: CM Consult Post Acute Care Choice: [place] arrangements for the past 2 months: Single Family Home Expected Discharge Date: 02/01/20                                     Social Determinants of Health (SDOH) Interventions    Readmission Risk Interventions No flowsheet data found.

## 2020-02-03 NOTE — Progress Notes (Signed)
PROGRESS NOTE    Mallory Murphy  ZWC:[id number] DOB: [birth year] DOA: 01/30/2020 PCP: Mallory Carol, MD   Brief Narrative: 74 year old female with past medical history of EPO and iron deficiency anemia,syncope, urinary incontinence,osteoarthritis, RA, DJD of cervical spine,hypertension who presented to [facility] on 01/30/20 for concerns of low back pain 5 days ago after she had a syncopal episode and landed on her buttock. She went to [facility] on 10/17. She had a CT head, xrays of her back and hip and reported that everything was negative and was kept in the ED overnight. Had repeat labs and PT/OT eval. Due to lack of adequate pain control and lack of seeing PT, patient left and went to [facility]. At [facility] she complained of 10/10 sharp low back pain with movement, improved with rest. The ED physician and radiologist reviewed the images and the CT spine was concerning for nondisplaced bilateral sacral alar fractures possibly mildly displaced central fracture of the S3 body also. Given her difficult pain control and inability to ambulate, the patient was transferred to [facility] for PT/OT eval and pain control. Currently, she states that her pain is well controlled with the Percocet from this morning while at rest but has pain with any movement.  Regarding her syncopal episode, the patient states that she was getting out of the car and felt "noodly." Said that she was feeling diaphoretic and nauseas so she sat down on her family's step outside for a minute. She went to stand up and then immediately synopsized which lasted seconds. Says this has happened in the past. No postictal episode, no chest pain, no palpitations, no other red flags.    Assessment & Plan:   Principal Problem:   Sacral fracture (HCC) Active Problems:   HTN (hypertension)   Syncope, vasovagal   Erythropoietin deficiency anemia   Hypertensive urgency  #1 orthostatic hypotension -status post fall secondary to being orthostatic now with  bilateral sacral fractures-she received IV fluids and was started on TED hose.  Her symptoms improved.  Seen by physical therapy.  Recommending skilled nursing facility.  Patient lives alone.  She is still unsteady with her gait.  Prior to admission she ambulated without walker at home.  #2 hypertension continue home dose of losartan.  #3 CKD stage IIIa patient received IV fluids during the hospital stay.  Creatinine on discharge was 1.15 follow-up labs in a.m.  #4 iron deficiency anemia at baseline  #5 urinary incontinence oxybutynin is on hold at the time of discharge.  Estimated body mass index is 25.32 kg/m as calculated from the following:   Height as of this encounter: 5\' 1"  (1.549 m).   Weight as of this encounter: 60.8 kg.  DVT prophylaxis: Lovenox  code Status: Full code  family Communication: Discussed with patient's niece disposition Plan:  Status is inpatient  Dispo: The patient is from: Home              Anticipated d/c is to:  Skilled nursing facility for rehabilitation           Anticipated d/c date is: 2 days              Patient currently is not medically stable to d/c.  Patient very orthostatic with unsteady gait with fall.  She is an unsafe discharge to home.  Case manager consulted for SNF.    Consultants:   None  Procedures: None Antimicrobials: None  Subjective: Patient reports she is feeling better than the day she was admitted however  still unsteady gait.  Waiting for rehabilitation. Objective: Vitals:   02/02/20 2157 02/02/20 2235 02/03/20 0600 02/03/20 1047  BP: (!) 184/75 (!) 176/71 (!) 182/86 (!) 198/83  Pulse: 97 (!) 102 94 90  Resp:  18 16 17   Temp:   98 F (36.7 C) 98.3 F (36.8 C)  TempSrc:   Oral Oral  SpO2:  100% 100% 100%  Weight:      Height:        Intake/Output Summary (Last 24 hours) at 02/03/2020 1251 Last data filed at 02/03/2020 0601 Gross per 24 hour  Intake 3198.27 ml  Output 2300 ml  Net 898.27 ml   Filed Weights     01/30/20 1716  Weight: 60.8 kg    Examination:  General exam: Appears calm and comfortable  Respiratory system: Clear to auscultation. Respiratory effort normal. Cardiovascular system: S1 & S2 heard, RRR. No JVD, murmurs, rubs, gallops or clicks. No pedal edema. Gastrointestinal system: Abdomen is nondistended, soft and nontender. No organomegaly or masses felt. Normal bowel sounds heard. Central nervous system: Alert and oriented. No focal neurological deficits. Extremities: Symmetric 5 x 5 power. Skin: No rashes, lesions or ulcers Psychiatry: Judgement and insight appear normal. Mood & affect appropriate.     Data Reviewed: I have personally reviewed following labs and imaging studies  CBC: Recent Labs  Lab 01/30/20 2352 01/31/20 0919 02/01/20 0338  WBC 11.7* 8.9 8.3  NEUTROABS 7.5  --   --   HGB 10.6* 10.4* 10.0*  HCT 30.5* 29.5* 29.5*  MCV 103.7* 103.1* 103.9*  PLT 207 206 297   Basic Metabolic Panel: Recent Labs  Lab 01/30/20 2352 01/31/20 0919 02/01/20 0338 02/01/20 0352  NA 137 136 133*  --   K 4.0 3.4* 3.9  --   CL 103 103 99  --   CO2 23 21* 24  --   GLUCOSE 82 91 97  --   BUN 15 14 17   --   CREATININE 0.96 0.79 1.15*  --   CALCIUM 9.1 8.8* 8.4*  --   MG  --   --   --  1.7  PHOS  --   --   --  3.6   GFR: Estimated Creatinine Clearance: 36.5 mL/min (A) (by C-G formula based on SCr of 1.15 mg/dL (H)). Liver Function Tests: No results for input(s): AST, ALT, ALKPHOS, BILITOT, PROT, ALBUMIN in the last 168 hours. No results for input(s): LIPASE, AMYLASE in the last 168 hours. No results for input(s): AMMONIA in the last 168 hours. Coagulation Profile: No results for input(s): INR, PROTIME in the last 168 hours. Cardiac Enzymes: No results for input(s): CKTOTAL, CKMB, CKMBINDEX, TROPONINI in the last 168 hours. BNP (last 3 results) No results for input(s): PROBNP in the last 8760 hours. HbA1C: No results for input(s): HGBA1C in the last 72  hours. CBG: No results for input(s): GLUCAP in the last 168 hours. Lipid Profile: No results for input(s): CHOL, HDL, LDLCALC, TRIG, CHOLHDL, LDLDIRECT in the last 72 hours. Thyroid Function Tests: No results for input(s): TSH, T4TOTAL, FREET4, T3FREE, THYROIDAB in the last 72 hours. Anemia Panel: No results for input(s): VITAMINB12, FOLATE, FERRITIN, TIBC, IRON, RETICCTPCT in the last 72 hours. Sepsis Labs: No results for input(s): PROCALCITON, LATICACIDVEN in the last 168 hours.  Recent Results (from the past 240 hour(s))  Respiratory Panel by RT PCR (Flu A&B, Covid) - Nasopharyngeal Swab     Status: None   Collection Time: 01/30/20 11:52 PM   Specimen:  Nasopharyngeal Swab  Result Value Ref Range Status   SARS Coronavirus 2 by RT PCR NEGATIVE NEGATIVE Final    Comment: (NOTE) SARS-CoV-2 target nucleic acids are NOT DETECTED.  The SARS-CoV-2 RNA is generally detectable in upper respiratoy specimens during the acute phase of infection. The lowest concentration of SARS-CoV-2 viral copies this assay can detect is 131 copies/mL. A negative result does not preclude SARS-Cov-2 infection and should not be used as the sole basis for treatment or other patient management decisions. A negative result may occur with  improper specimen collection/handling, submission of specimen other than nasopharyngeal swab, presence of viral mutation(s) within the areas targeted by this assay, and inadequate number of viral copies (<131 copies/mL). A negative result must be combined with clinical observations, patient history, and epidemiological information. The expected result is Negative.  Fact Sheet for Patients:  PinkCheek.be  Fact Sheet for Healthcare Providers:  GravelBags.it  This test is no t yet approved or cleared by the [place] FDA and  has been authorized for detection and/or diagnosis of SARS-CoV-2 by FDA under an Emergency  Use Authorization (EUA). This EUA will remain  in effect (meaning this test can be used) for the duration of the COVID-19 declaration under Section 564(b)(1) of the Act, 21 U.S.C. section 360bbb-3(b)(1), unless the authorization is terminated or revoked sooner.     Influenza A by PCR NEGATIVE NEGATIVE Final   Influenza B by PCR NEGATIVE NEGATIVE Final    Comment: (NOTE) The Xpert Xpress SARS-CoV-2/FLU/RSV assay is intended as an aid in  the diagnosis of influenza from Nasopharyngeal swab specimens and  should not be used as a sole basis for treatment. Nasal washings and  aspirates are unacceptable for Xpert Xpress SARS-CoV-2/FLU/RSV  testing.  Fact Sheet for Patients: PinkCheek.be  Fact Sheet for Healthcare Providers: GravelBags.it  This test is not yet approved or cleared by the [place] FDA and  has been authorized for detection and/or diagnosis of SARS-CoV-2 by  FDA under an Emergency Use Authorization (EUA). This EUA will remain  in effect (meaning this test can be used) for the duration of the  Covid-19 declaration under Section 564(b)(1) of the Act, 21  U.S.C. section 360bbb-3(b)(1), unless the authorization is  terminated or revoked. Performed at [facility], [address], [place], [place] [address]          Radiology Studies: No results found.      Scheduled Meds: . enoxaparin (LOVENOX) injection  40 mg Subcutaneous Q24H  . escitalopram  10 mg Oral Daily  . losartan  100 mg Oral Daily   Continuous Infusions: . sodium chloride 100 mL/hr at 02/02/20 2206     LOS: 1 day     Georgette Shell, MD  02/03/2020, 12:51 PM

## 2020-02-03 NOTE — Plan of Care (Signed)
Plan of care reviewed and discussed with the patient. 

## 2020-02-03 NOTE — Progress Notes (Signed)
Physical Therapy Treatment Patient Details Name: Mallory Murphy MRN: [id number] DOB: 04-24-1945 Today's Date: 02/03/2020    History of Present Illness 74 year old female with past medical history of EPO and iron deficiency anemia, syncope, urinary incontinence, osteoarthritis, RA, DJD of cervical spine, hypertension who presented to [facility] on 01/30/20 for concerns of low back pain 5 days ago after she had a syncopal episode and landed on her buttock. She went to [place] on 10/17. She had a CT head, xrays of her back and hip and reported that everything was negative and was kept in the ED overnight. Had repeat labs and PT/OT eval. Due to lack of adequate pain control and lack of seeing PT, patient left and went to [facility]. At [facility] she complained of 10/10 sharp low back pain with movement, improved with rest. The ED physician and radiologist reviewed the images and the CT spine was concerning for nondisplaced bilateral sacral alar fractures possibly mildly displaced central fracture of the S3 body also.  Given her difficult pain control and inability to ambulate, the patient was transferred to [facility] for PT/OT eval and pain control    PT Comments    Pt progressing well today, pain improving. Able to amb short distance with RW and min assist, chair follow for safety as pt was orthostatic last session. Continue to recommend  SNF post acute to maximize independence and safety   Follow Up Recommendations  Supervision/Assistance - 24 hour;SNF     Equipment Recommendations  None recommended by PT    Recommendations for Other Services       Precautions / Restrictions Precautions Precautions: Fall Restrictions Weight Bearing Restrictions: No Other Position/Activity Restrictions: WBAT    Mobility  Bed Mobility Overal bed mobility: Needs Assistance Bed Mobility: Supine to Sit     Supine to sit: Min assist     General bed mobility comments: assist to elevate trunk   Transfers Overall transfer  level: Needs assistance Equipment used: Rolling walker (2 wheeled) Transfers: Sit to/from Stand Sit to Stand: Min guard         General transfer comment: verbal cues for hand placement and overall safety   Ambulation/Gait Ambulation/Gait assistance: Min guard;Min assist Gait Distance (Feet): 20 Feet Assistive device: Rolling walker (2 wheeled) Gait Pattern/deviations: Step-to pattern;Decreased stance time - right;Decreased weight shift to right Gait velocity: decr   General Gait Details: cues for sequence and wt shift, assist for balance and safety, to maneuver RW at times    Stairs             Wheelchair Mobility    Modified Rankin (Stroke Patients Only)       Balance Overall balance assessment: Needs assistance Sitting-balance support: No upper extremity supported;Feet supported Sitting balance-Leahy Scale: Fair       Standing balance-Leahy Scale: Poor Standing balance comment: reliant on UE support                            Cognition Arousal/Alertness: Awake/alert Behavior During Therapy: WFL for tasks assessed/performed Overall Cognitive Status: Within Functional Limits for tasks assessed                                        Exercises      General Comments        Pertinent Vitals/Pain Pain Assessment: 0-10 Pain Score: 3  Pain Location:  sacral area, R hip/groin Pain Descriptors / Indicators: Discomfort;Grimacing;Sore Pain Intervention(s): Limited activity within patient's tolerance;Monitored during session;Premedicated before session;Repositioned    Home Living                      Prior Function            PT Goals (current goals can now be found in the care plan section) Acute Rehab PT Goals Patient Stated Goal: to get up and walk PT Goal Formulation: With patient Time For Goal Achievement: 02/07/20 Potential to Achieve Goals: Good Progress towards PT goals: Progressing toward goals     Frequency    Min 3X/week      PT Plan Discharge plan needs to be updated;Frequency needs to be updated    Co-evaluation              AM-PAC PT "6 Clicks" Mobility   Outcome Measure  Help needed turning from your back to your side while in a flat bed without using bedrails?: A Lot Help needed moving from lying on your back to sitting on the side of a flat bed without using bedrails?: A Little Help needed moving to and from a bed to a chair (including a wheelchair)?: A Little Help needed standing up from a chair using your arms (e.g., wheelchair or bedside chair)?: A Little Help needed to walk in hospital room?: A Little Help needed climbing 3-5 steps with a railing? : A Lot 6 Click Score: 16    End of Session Equipment Utilized During Treatment: Gait belt Activity Tolerance: Patient tolerated treatment well Patient left: in chair;with call bell/phone within reach;with chair alarm set Nurse Communication: Mobility status PT Visit Diagnosis: Difficulty in walking, not elsewhere classified (R26.2)     Time: 3291-9166 PT Time Calculation (min) (ACUTE ONLY): 15 min  Charges:  $Gait Training: 8-22 mins                     Baxter Flattery, PT  Acute Rehab Dept ([place]) [phone] Pager [phone]  02/03/2020    [facility] 02/03/2020, 12:59 PM

## 2020-02-04 DIAGNOSIS — S3210XA Unspecified fracture of sacrum, initial encounter for closed fracture: Secondary | ICD-10-CM | POA: Diagnosis not present

## 2020-02-04 LAB — BASIC METABOLIC PANEL
Anion gap: 8 (ref 5–15)
BUN: 14 mg/dL (ref 8–23)
CO2: 23 mmol/L (ref 22–32)
Calcium: 8.4 mg/dL — ABNORMAL LOW (ref 8.9–10.3)
Chloride: 108 mmol/L (ref 98–111)
Creatinine, Ser: 0.89 mg/dL (ref 0.44–1.00)
GFR, Estimated: >60 mL/min (ref >60)
Glucose, Bld: 87 mg/dL (ref 70–99)
Potassium: 3.6 mmol/L (ref 3.5–5.1)
Sodium: 139 mmol/L (ref 135–145)

## 2020-02-04 NOTE — Plan of Care (Signed)
  Problem: Clinical Measurements: Goal: Will remain free from infection Outcome: Progressing   Problem: Safety: Goal: Ability to remain free from injury will improve Outcome: Progressing   

## 2020-02-04 NOTE — Progress Notes (Signed)
PROGRESS NOTE    Mallory Murphy  XBM:[id number] DOB: Dec 22, 1945 DOA: 01/30/2020 PCP: Seward Carol, MD   Brief Narrative: 74 year old female with past medical history of EPO and iron deficiency anemia,syncope, urinary incontinence,osteoarthritis, RA, DJD of cervical spine,hypertension who presented to [facility] on 01/30/20 for concerns of low back pain 5 days ago after she had a syncopal episode and landed on her buttock. She went to [place] on 10/17. She had a CT head, xrays of her back and hip and reported that everything was negative and was kept in the ED overnight. Had repeat labs and PT/OT eval. Due to lack of adequate pain control and lack of seeing PT, patient left and went to [facility]. At [facility] she complained of 10/10 sharp low back pain with movement, improved with rest. The ED physician and radiologist reviewed the images and the CT spine was concerning for nondisplaced bilateral sacral alar fractures possibly mildly displaced central fracture of the S3 body also. Given her difficult pain control and inability to ambulate, the patient was transferred to [facility] for PT/OT eval and pain control. Currently, she states that her pain is well controlled with the Percocet from this morning while at rest but has pain with any movement.  Regarding her syncopal episode, the patient states that she was getting out of the car and felt "noodly." Said that she was feeling diaphoretic and nauseas so she sat down on her family's step outside for a minute. She went to stand up and then immediately synopsized which lasted seconds. Says this has happened in the past. No postictal episode, no chest pain, no palpitations, no other red flags.    Assessment & Plan:   Principal Problem:   Sacral fracture (HCC) Active Problems:   HTN (hypertension)   Syncope, vasovagal   Erythropoietin deficiency anemia   Hypertensive urgency  #1 orthostatic hypotension -status post fall secondary to being orthostatic now with  bilateral sacral fractures-she received IV fluids and was started on TED hose.  Her symptoms improved.  Seen by physical therapy.  Recommending skilled nursing facility.  Patient lives alone.  She is still unsteady with her gait.  Prior to admission she ambulated without walker at home.  #2 hypertension continue home dose of losartan. Blood pressure better 162/83.  #3 CKD stage IIIa patient received IV fluids during the hospital stay.  Creatinine on discharge was 1.15 follow-up labs in a.m. creatinine 0.89 today.  #4 iron deficiency anemia at baseline  #5 urinary incontinence oxybutynin is on hold at the time of discharge.  Estimated body mass index is 25.32 kg/m as calculated from the following:   Height as of this encounter: 5\' 1"  (1.549 m).   Weight as of this encounter: 60.8 kg.  DVT prophylaxis: Lovenox  code Status: Full code  family Communication: Discussed with patient's niece disposition Plan:  Status is inpatient  Dispo: The patient is from: Home              Anticipated d/c is to:  Skilled nursing facility for rehabilitation           Anticipated d/c date is: 2 days              Patient currently is not medically stable to d/c.  Patient very orthostatic with unsteady gait with fall.  She is an unsafe discharge to home.  Case manager consulted for SNF.    Consultants:   None  Procedures: None Antimicrobials: None  Subjective: She is resting in bed awake  and alert anxious to go to rehab and then go home Objective: Vitals:   02/03/20 1411 02/03/20 2158 02/04/20 0532 02/04/20 0830  BP: (!) 146/78 (!) 161/73 (!) 160/90 (!) 162/83  Pulse: 98 100 93 81  Resp: 17 18 15 12   Temp: 97.8 F (36.6 C) 98.2 F (36.8 C) 98.1 F (36.7 C) 98.3 F (36.8 C)  TempSrc: Oral Oral  Oral  SpO2: 100% 100% 100% 100%  Weight:      Height:        Intake/Output Summary (Last 24 hours) at 02/04/2020 1228 Last data filed at 02/04/2020 1000 Gross per 24 hour  Intake 540 ml  Output  2100 ml  Net -1560 ml   Filed Weights   01/30/20 1716  Weight: 60.8 kg    Examination:  General exam: Appears calm and comfortable  Respiratory system: Clear to auscultation. Respiratory effort normal. Cardiovascular system: S1 & S2 heard, RRR. No JVD, murmurs, rubs, gallops or clicks. No pedal edema. Gastrointestinal system: Abdomen is nondistended, soft and nontender. No organomegaly or masses felt. Normal bowel sounds heard. Central nervous system: Alert and oriented. No focal neurological deficits. Extremities: Symmetric 5 x 5 power. Skin: No rashes, lesions or ulcers Psychiatry: Judgement and insight appear normal. Mood & affect appropriate.     Data Reviewed: I have personally reviewed following labs and imaging studies  CBC: Recent Labs  Lab 01/30/20 2352 01/31/20 0919 02/01/20 0338  WBC 11.7* 8.9 8.3  NEUTROABS 7.5  --   --   HGB 10.6* 10.4* 10.0*  HCT 30.5* 29.5* 29.5*  MCV 103.7* 103.1* 103.9*  PLT 207 206 086   Basic Metabolic Panel: Recent Labs  Lab 01/30/20 2352 01/31/20 0919 02/01/20 0338 02/01/20 0352 02/04/20 0317  NA 137 136 133*  --  139  K 4.0 3.4* 3.9  --  3.6  CL 103 103 99  --  108  CO2 23 21* 24  --  23  GLUCOSE 82 91 97  --  87  BUN 15 14 17   --  14  CREATININE 0.96 0.79 1.15*  --  0.89  CALCIUM 9.1 8.8* 8.4*  --  8.4*  MG  --   --   --  1.7  --   PHOS  --   --   --  3.6  --    GFR: Estimated Creatinine Clearance: 47.1 mL/min (by C-G formula based on SCr of 0.89 mg/dL). Liver Function Tests: No results for input(s): AST, ALT, ALKPHOS, BILITOT, PROT, ALBUMIN in the last 168 hours. No results for input(s): LIPASE, AMYLASE in the last 168 hours. No results for input(s): AMMONIA in the last 168 hours. Coagulation Profile: No results for input(s): INR, PROTIME in the last 168 hours. Cardiac Enzymes: No results for input(s): CKTOTAL, CKMB, CKMBINDEX, TROPONINI in the last 168 hours. BNP (last 3 results) No results for input(s):  PROBNP in the last 8760 hours. HbA1C: No results for input(s): HGBA1C in the last 72 hours. CBG: No results for input(s): GLUCAP in the last 168 hours. Lipid Profile: No results for input(s): CHOL, HDL, LDLCALC, TRIG, CHOLHDL, LDLDIRECT in the last 72 hours. Thyroid Function Tests: No results for input(s): TSH, T4TOTAL, FREET4, T3FREE, THYROIDAB in the last 72 hours. Anemia Panel: No results for input(s): VITAMINB12, FOLATE, FERRITIN, TIBC, IRON, RETICCTPCT in the last 72 hours. Sepsis Labs: No results for input(s): PROCALCITON, LATICACIDVEN in the last 168 hours.  Recent Results (from the past 240 hour(s))  Respiratory Panel by RT PCR (  Flu A&B, Covid) - Nasopharyngeal Swab     Status: None   Collection Time: 01/30/20 11:52 PM   Specimen: Nasopharyngeal Swab  Result Value Ref Range Status   SARS Coronavirus 2 by RT PCR NEGATIVE NEGATIVE Final    Comment: (NOTE) SARS-CoV-2 target nucleic acids are NOT DETECTED.  The SARS-CoV-2 RNA is generally detectable in upper respiratoy specimens during the acute phase of infection. The lowest concentration of SARS-CoV-2 viral copies this assay can detect is 131 copies/mL. A negative result does not preclude SARS-Cov-2 infection and should not be used as the sole basis for treatment or other patient management decisions. A negative result may occur with  improper specimen collection/handling, submission of specimen other than nasopharyngeal swab, presence of viral mutation(s) within the areas targeted by this assay, and inadequate number of viral copies (<131 copies/mL). A negative result must be combined with clinical observations, patient history, and epidemiological information. The expected result is Negative.  Fact Sheet for Patients:  PinkCheek.be  Fact Sheet for Healthcare Providers:  GravelBags.it  This test is no t yet approved or cleared by the [place] FDA and  has  been authorized for detection and/or diagnosis of SARS-CoV-2 by FDA under an Emergency Use Authorization (EUA). This EUA will remain  in effect (meaning this test can be used) for the duration of the COVID-19 declaration under Section 564(b)(1) of the Act, 21 U.S.C. section 360bbb-3(b)(1), unless the authorization is terminated or revoked sooner.     Influenza A by PCR NEGATIVE NEGATIVE Final   Influenza B by PCR NEGATIVE NEGATIVE Final    Comment: (NOTE) The Xpert Xpress SARS-CoV-2/FLU/RSV assay is intended as an aid in  the diagnosis of influenza from Nasopharyngeal swab specimens and  should not be used as a sole basis for treatment. Nasal washings and  aspirates are unacceptable for Xpert Xpress SARS-CoV-2/FLU/RSV  testing.  Fact Sheet for Patients: PinkCheek.be  Fact Sheet for Healthcare Providers: GravelBags.it  This test is not yet approved or cleared by the [place] FDA and  has been authorized for detection and/or diagnosis of SARS-CoV-2 by  FDA under an Emergency Use Authorization (EUA). This EUA will remain  in effect (meaning this test can be used) for the duration of the  Covid-19 declaration under Section 564(b)(1) of the Act, 21  U.S.C. section 360bbb-3(b)(1), unless the authorization is  terminated or revoked. Performed at [facility], [address] [place], [place] [address]          Radiology Studies: No results found.      Scheduled Meds: . amLODipine  5 mg Oral Daily  . enoxaparin (LOVENOX) injection  40 mg Subcutaneous Q24H  . escitalopram  10 mg Oral Daily  . losartan  100 mg Oral Daily   Continuous Infusions:    LOS: 2 days     Georgette Shell, MD  02/04/2020, 12:28 PM

## 2020-02-04 NOTE — Progress Notes (Signed)
Physical Therapy Treatment Patient Details Name: Mallory Murphy MRN: [id number] DOB: 23-Mar-1946 Today's Date: 02/04/2020    History of Present Illness 74 year old female with past medical history of EPO and iron deficiency anemia, syncope, urinary incontinence, osteoarthritis, RA, DJD of cervical spine, hypertension who presented to [facility] on 01/30/20 for concerns of low back pain 5 days ago after she had a syncopal episode and landed on her buttock. She went to [place] on 10/17. She had a CT head, xrays of her back and hip and reported that everything was negative and was kept in the ED overnight. Had repeat labs and PT/OT eval. Due to lack of adequate pain control and lack of seeing PT, patient left and went to [facility] At [facility] she complained of 10/10 sharp low back pain with movement, improved with rest. The ED physician and radiologist reviewed the images and the CT spine was concerning for nondisplaced bilateral sacral alar fractures possibly mildly displaced central fracture of the S3 body also.  Given her difficult pain control and inability to ambulate, the patient was transferred to [facility] for PT/OT eval and pain control    PT Comments    Progressing with mobility. Continue to recommend SNF.    Follow Up Recommendations  SNF     Equipment Recommendations  None recommended by PT    Recommendations for Other Services       Precautions / Restrictions Precautions Precautions: Fall Restrictions Weight Bearing Restrictions: No Other Position/Activity Restrictions: WBAT    Mobility  Bed Mobility Overal bed mobility: Needs Assistance Bed Mobility: Supine to Sit;Sit to Supine     Supine to sit: Min assist;HOB elevated Sit to supine: Min assist;HOB elevated   General bed mobility comments: Assist for trunk and scoot to EOB. Increased time. Pt has poor UE strength and limited mobility.  Transfers Overall transfer level: Needs assistance Equipment used: Rolling walker (2  wheeled) Transfers: Sit to/from Stand Sit to Stand: Min assist         General transfer comment: Assist to steady and to control descent. VCs safety, technique, hand placement.  Ambulation/Gait Ambulation/Gait assistance: Min assist Gait Distance (Feet): 25 Feet Assistive device: Rolling walker (2 wheeled) Gait Pattern/deviations: Step-to pattern     General Gait Details: Cues for safety, weight shift. Assist to stabilize pt throughout distance and to maneuver RW intermittently. Several brief standing rest breaks needed/taken.   Stairs             Wheelchair Mobility    Modified Rankin (Stroke Patients Only)       Balance Overall balance assessment: Needs assistance         Standing balance support: Bilateral upper extremity supported Standing balance-Leahy Scale: Poor                              Cognition Arousal/Alertness: Awake/alert Behavior During Therapy: WFL for tasks assessed/performed Overall Cognitive Status: Within Functional Limits for tasks assessed                                        Exercises      General Comments        Pertinent Vitals/Pain Pain Assessment: 0-10 Pain Score: 5  Pain Location: sacral area Pain Descriptors / Indicators: Discomfort;Grimacing;Sore Pain Intervention(s): Limited activity within patient's tolerance;Monitored during session;Repositioned    Home Living  Prior Function            PT Goals (current goals can now be found in the care plan section) Progress towards PT goals: Progressing toward goals    Frequency    Min 3X/week      PT Plan Current plan remains appropriate    Co-evaluation              AM-PAC PT "6 Clicks" Mobility   Outcome Measure  Help needed turning from your back to your side while in a flat bed without using bedrails?: A Little Help needed moving from lying on your back to sitting on the side of a flat  bed without using bedrails?: A Little Help needed moving to and from a bed to a chair (including a wheelchair)?: A Little Help needed standing up from a chair using your arms (e.g., wheelchair or bedside chair)?: A Little Help needed to walk in hospital room?: A Little Help needed climbing 3-5 steps with a railing? : A Lot 6 Click Score: 17    End of Session Equipment Utilized During Treatment: Gait belt Activity Tolerance: Patient tolerated treatment well Patient left: in bed;with call bell/phone within reach;with bed alarm set   PT Visit Diagnosis: Difficulty in walking, not elsewhere classified (R26.2);Pain Pain - part of body:  (sacrum)     Time: 5449-2010 PT Time Calculation (min) (ACUTE ONLY): 13 min  Charges:  $Gait Training: 8-22 mins                         Doreatha Massed, PT Acute Rehabilitation  Office: [phone] Pager: [phone]

## 2020-02-05 DIAGNOSIS — S3210XA Unspecified fracture of sacrum, initial encounter for closed fracture: Secondary | ICD-10-CM | POA: Diagnosis not present

## 2020-02-05 LAB — SARS CORONAVIRUS 2 BY RT PCR (HOSPITAL ORDER, PERFORMED IN ~~LOC~~ HOSPITAL LAB): SARS Coronavirus 2: NEGATIVE

## 2020-02-05 NOTE — Progress Notes (Signed)
PROGRESS NOTE    Mallory Murphy  JSH:[id number] DOB: 1945/11/18 DOA: 01/30/2020 PCP: Seward Carol, MD   Brief Narrative: 74 year old female with past medical history of EPO and iron deficiency anemia,syncope, urinary incontinence,osteoarthritis, RA, DJD of cervical spine,hypertension who presented to [facility] on 01/30/20 for concerns of low back pain 5 days ago after she had a syncopal episode and landed on her buttock. She went to [place] [facility] on 10/17. She had a CT head, xrays of her back and hip and reported that everything was negative and was kept in the ED overnight. Had repeat labs and PT/OT eval. Due to lack of adequate pain control and lack of seeing PT, patient left and went to [facility]. At [facility] she complained of 10/10 sharp low back pain with movement, improved with rest. The ED physician and radiologist reviewed the images and the CT spine was concerning for nondisplaced bilateral sacral alar fractures possibly mildly displaced central fracture of the S3 body also. Given her difficult pain control and inability to ambulate, the patient was transferred to [facility] for PT/OT eval and pain control. Currently, she states that her pain is well controlled with the Percocet from this morning while at rest but has pain with any movement.  Regarding her syncopal episode, the patient states that she was getting out of the car and felt "noodly." Said that she was feeling diaphoretic and nauseas so she sat down on her family's step outside for a minute. She went to stand up and then immediately synopsized which lasted seconds. Says this has happened in the past. No postictal episode, no chest pain, no palpitations, no other red flags.    Assessment & Plan:   Principal Problem:   Sacral fracture (HCC) Active Problems:   HTN (hypertension)   Syncope, vasovagal   Erythropoietin deficiency anemia   Hypertensive urgency  #1 orthostatic hypotension -status post fall secondary to being orthostatic now with  bilateral sacral fractures-she received IV fluids and was started on TED hose.  Her symptoms improved.  Seen by physical therapy.  Recommending skilled nursing facility.  Patient lives alone.  She is still unsteady with her gait.  Prior to admission she ambulated without walker at home.  #2 hypertension blood pressure 166/66 continue losartan.  This blood pressure is prior to medications.   #3 CKD stage IIIa patient received IV fluids during the hospital stay.  Creatinine on discharge was 1.15 follow-up labs in a.m. creatinine 0.89 today.  #4 iron deficiency anemia at baseline  #5 urinary incontinence oxybutynin is on hold at the time of discharge.  Estimated body mass index is 25.32 kg/m as calculated from the following:   Height as of this encounter: 5\' 1"  (1.549 m).   Weight as of this encounter: 60.8 kg.  DVT prophylaxis: Lovenox  code Status: Full code  family Communication: Discussed with patient's niece disposition Plan:  Status is inpatient  Dispo: The patient is from: Home              Anticipated d/c is to:  Skilled nursing facility for rehabilitation           Anticipated d/c date is: 2 days              Patient currently is not medically stable to d/c.  Patient very orthostatic with unsteady gait with fall.  She is an unsafe discharge to home.  Case manager consulted for SNF.    Consultants:   None  Procedures: None Antimicrobials: None  Subjective: Patient  is resting in bed she has no complaints she is feeling better she walked in the hallway yesterday with therapy  Did not feel lightheaded.   Objective: Vitals:   02/04/20 0830 02/04/20 1415 02/04/20 2053 02/05/20 0447  BP: (!) 162/83 (!) 122/53 (!) 160/71 (!) 166/66  Pulse: 81 91 95 85  Resp: 12 14 18 16   Temp: 98.3 F (36.8 C) 98.4 F (36.9 C) 99.1 F (37.3 C) 98.4 F (36.9 C)  TempSrc: Oral Oral Oral   SpO2: 100%  98% 97%  Weight:      Height:        Intake/Output Summary (Last 24 hours) at  02/05/2020 1247 Last data filed at 02/05/2020 1134 Gross per 24 hour  Intake 720 ml  Output 800 ml  Net -80 ml   Filed Weights   01/30/20 1716  Weight: 60.8 kg    Examination:  General exam: Appears calm and comfortable  Respiratory system: Clear to auscultation. Respiratory effort normal. Cardiovascular system: S1 & S2 heard, RRR. No JVD, murmurs, rubs, gallops or clicks. No pedal edema. Gastrointestinal system: Abdomen is nondistended, soft and nontender. No organomegaly or masses felt. Normal bowel sounds heard. Central nervous system: Alert and oriented. No focal neurological deficits. Extremities: Symmetric 5 x 5 power. Skin: No rashes, lesions or ulcers Psychiatry: Judgement and insight appear normal. Mood & affect appropriate.     Data Reviewed: I have personally reviewed following labs and imaging studies  CBC: Recent Labs  Lab 01/30/20 2352 01/31/20 0919 02/01/20 0338  WBC 11.7* 8.9 8.3  NEUTROABS 7.5  --   --   HGB 10.6* 10.4* 10.0*  HCT 30.5* 29.5* 29.5*  MCV 103.7* 103.1* 103.9*  PLT 207 206 132   Basic Metabolic Panel: Recent Labs  Lab 01/30/20 2352 01/31/20 0919 02/01/20 0338 02/01/20 0352 02/04/20 0317  NA 137 136 133*  --  139  K 4.0 3.4* 3.9  --  3.6  CL 103 103 99  --  108  CO2 23 21* 24  --  23  GLUCOSE 82 91 97  --  87  BUN 15 14 17   --  14  CREATININE 0.96 0.79 1.15*  --  0.89  CALCIUM 9.1 8.8* 8.4*  --  8.4*  MG  --   --   --  1.7  --   PHOS  --   --   --  3.6  --    GFR: Estimated Creatinine Clearance: 47.1 mL/min (by C-G formula based on SCr of 0.89 mg/dL). Liver Function Tests: No results for input(s): AST, ALT, ALKPHOS, BILITOT, PROT, ALBUMIN in the last 168 hours. No results for input(s): LIPASE, AMYLASE in the last 168 hours. No results for input(s): AMMONIA in the last 168 hours. Coagulation Profile: No results for input(s): INR, PROTIME in the last 168 hours. Cardiac Enzymes: No results for input(s): CKTOTAL, CKMB,  CKMBINDEX, TROPONINI in the last 168 hours. BNP (last 3 results) No results for input(s): PROBNP in the last 8760 hours. HbA1C: No results for input(s): HGBA1C in the last 72 hours. CBG: No results for input(s): GLUCAP in the last 168 hours. Lipid Profile: No results for input(s): CHOL, HDL, LDLCALC, TRIG, CHOLHDL, LDLDIRECT in the last 72 hours. Thyroid Function Tests: No results for input(s): TSH, T4TOTAL, FREET4, T3FREE, THYROIDAB in the last 72 hours. Anemia Panel: No results for input(s): VITAMINB12, FOLATE, FERRITIN, TIBC, IRON, RETICCTPCT in the last 72 hours. Sepsis Labs: No results for input(s): PROCALCITON, LATICACIDVEN in the last  168 hours.  Recent Results (from the past 240 hour(s))  Respiratory Panel by RT PCR (Flu A&B, Covid) - Nasopharyngeal Swab     Status: None   Collection Time: 01/30/20 11:52 PM   Specimen: Nasopharyngeal Swab  Result Value Ref Range Status   SARS Coronavirus 2 by RT PCR NEGATIVE NEGATIVE Final    Comment: (NOTE) SARS-CoV-2 target nucleic acids are NOT DETECTED.  The SARS-CoV-2 RNA is generally detectable in upper respiratoy specimens during the acute phase of infection. The lowest concentration of SARS-CoV-2 viral copies this assay can detect is 131 copies/mL. A negative result does not preclude SARS-Cov-2 infection and should not be used as the sole basis for treatment or other patient management decisions. A negative result may occur with  improper specimen collection/handling, submission of specimen other than nasopharyngeal swab, presence of viral mutation(s) within the areas targeted by this assay, and inadequate number of viral copies (<131 copies/mL). A negative result must be combined with clinical observations, patient history, and epidemiological information. The expected result is Negative.  Fact Sheet for Patients:  PinkCheek.be  Fact Sheet for Healthcare Providers:    GravelBags.it  This test is no t yet approved or cleared by the [place] FDA and  has been authorized for detection and/or diagnosis of SARS-CoV-2 by FDA under an Emergency Use Authorization (EUA). This EUA will remain  in effect (meaning this test can be used) for the duration of the COVID-19 declaration under Section 564(b)(1) of the Act, 21 U.S.C. section 360bbb-3(b)(1), unless the authorization is terminated or revoked sooner.     Influenza A by PCR NEGATIVE NEGATIVE Final   Influenza B by PCR NEGATIVE NEGATIVE Final    Comment: (NOTE) The Xpert Xpress SARS-CoV-2/FLU/RSV assay is intended as an aid in  the diagnosis of influenza from Nasopharyngeal swab specimens and  should not be used as a sole basis for treatment. Nasal washings and  aspirates are unacceptable for Xpert Xpress SARS-CoV-2/FLU/RSV  testing.  Fact Sheet for Patients: PinkCheek.be  Fact Sheet for Healthcare Providers: GravelBags.it  This test is not yet approved or cleared by the [place] FDA and  has been authorized for detection and/or diagnosis of SARS-CoV-2 by  FDA under an Emergency Use Authorization (EUA). This EUA will remain  in effect (meaning this test can be used) for the duration of the  Covid-19 declaration under Section 564(b)(1) of the Act, 21  U.S.C. section 360bbb-3(b)(1), unless the authorization is  terminated or revoked. Performed at [facility], [address] [place], [place] [address]   SARS Coronavirus 2 by RT PCR (hospital order, performed in [facility] hospital lab) Nasopharyngeal Nasopharyngeal Swab     Status: None   Collection Time: 02/05/20  9:38 AM   Specimen: Nasopharyngeal Swab  Result Value Ref Range Status   SARS Coronavirus 2 NEGATIVE NEGATIVE Final    Comment: (NOTE) SARS-CoV-2 target nucleic acids are NOT DETECTED.  The SARS-CoV-2 RNA is generally  detectable in upper and lower respiratory specimens during the acute phase of infection. The lowest concentration of SARS-CoV-2 viral copies this assay can detect is 250 copies / mL. A negative result does not preclude SARS-CoV-2 infection and should not be used as the sole basis for treatment or other patient management decisions.  A negative result may occur with improper specimen collection / handling, submission of specimen other than nasopharyngeal swab, presence of viral mutation(s) within the areas targeted by this assay, and inadequate number of viral copies (<250  copies / mL). A negative result must be combined with clinical observations, patient history, and epidemiological information.  Fact Sheet for Patients:   StrictlyIdeas.no  Fact Sheet for Healthcare Providers: BankingDealers.co.za  This test is not yet approved or  cleared by the [place] FDA and has been authorized for detection and/or diagnosis of SARS-CoV-2 by FDA under an Emergency Use Authorization (EUA).  This EUA will remain in effect (meaning this test can be used) for the duration of the COVID-19 declaration under Section 564(b)(1) of the Act, 21 U.S.C. section 360bbb-3(b)(1), unless the authorization is terminated or revoked sooner.  Performed at [facility], [place] [address], [place], [place] [address]          Radiology Studies: No results found.      Scheduled Meds: . amLODipine  5 mg Oral Daily  . enoxaparin (LOVENOX) injection  40 mg Subcutaneous Q24H  . escitalopram  10 mg Oral Daily  . losartan  100 mg Oral Daily   Continuous Infusions:    LOS: 3 days     Georgette Shell, MD  02/05/2020, 12:47 PM

## 2020-02-05 NOTE — Plan of Care (Signed)

## 2020-02-05 NOTE — Plan of Care (Signed)
  Problem: Education: Goal: Knowledge of General Education information will improve Description Including pain rating scale, medication(s)/side effects and non-pharmacologic comfort measures Outcome: Progressing   Problem: Clinical Measurements: Goal: Ability to maintain clinical measurements within normal limits will improve Outcome: Progressing   Problem: Clinical Measurements: Goal: Will remain free from infection Outcome: Progressing   

## 2020-02-05 NOTE — TOC Progression Note (Signed)
Transition of Care [facility]) - Progression Note    Patient Details  Name: Mallory Murphy MRN: [id number] Date of Birth: 12/14/1945  Transition of Care [facility]) CM/SW Contact  Shade Flood, LCSW Phone Number: 02/05/2020, 3:47 PM  Clinical Narrative:     Donella Stade called [name] this AM to inquire if they were able to accept pt today if she was medically stable. Was informed that they did not have administrative staff there for an admission and they had not been left any information about expecting an admission.  TOC will follow up with the SNF tomorrow if pt is medically ready for dc.  Expected Discharge Plan: [place] Barriers to Discharge: Continued Medical Work up  Expected Discharge Plan and Services Expected Discharge Plan: [place] In-house Referral: Clinical Social Work Discharge Planning Services: CM Consult Post Acute Care Choice: [place] arrangements for the past 2 months: Single Family Home Expected Discharge Date: 02/01/20                                     Social Determinants of Health (SDOH) Interventions    Readmission Risk Interventions No flowsheet data found.

## 2020-02-06 DIAGNOSIS — S3210XA Unspecified fracture of sacrum, initial encounter for closed fracture: Secondary | ICD-10-CM | POA: Diagnosis not present

## 2020-02-06 MED ORDER — AMLODIPINE BESYLATE 5 MG PO TABS
5.0000 mg | ORAL_TABLET | Freq: Every day | ORAL | 2 refills | Status: DC
Start: 2020-02-06 — End: 2020-04-17

## 2020-02-06 NOTE — Discharge Summary (Signed)
Physician Discharge Summary  Mallory Murphy WGY:[id number] DOB: 1945/09/18 DOA: 01/30/2020  PCP: Seward Carol, MD  Admit date: 01/30/2020 Discharge date: 02/06/2020  Admitted From:home Disposition:  snf Recommendations for Outpatient Follow-up:  1. Follow up with PCP in 1-2 weeks 2. Please obtain BMP/CBC in one week  Home Health none Equipment/Devices:none  Discharge Condition stable CODE STATUS stable Diet recommendation:cardiac Brief/Interim Summary: 74 year old female with past medical history of EPO and iron deficiency anemia,syncope, urinary incontinence,osteoarthritis, RA, DJD of cervical spine,hypertension who presented to [facility] on 01/30/20 for concerns of low back pain 5 days ago after she had a syncopal episode and landed on her buttock. She went to [facility] on 10/17. She had a CT head, xrays of her back and hip and reported that everything was negative and was kept in the ED overnight. Had repeat labs and PT/OT eval. Due to lack of adequate pain control and lack of seeing PT, patient left and went to [facility]. At [facility] she complained of 10/10 sharp low back pain with movement, improved with rest. The ED physician and radiologist reviewed the images and the CT spine was concerning for nondisplaced bilateral sacral alar fractures possibly mildly displaced central fracture of the S3 body also. Given her difficult pain control and inability to ambulate, the patient was transferred to [facility] for PT/OT eval and pain control. Currently, she states that her pain is well controlled with the Percocet from this morning while at rest but has pain with any movement.  Regarding her syncopal episode, the patient states that she was getting out of the car and felt "noodly." Said that she was feeling diaphoretic and nauseas so she sat down on her family's step outside for a minute. She went to stand up and then immediately synopsized which lasted seconds. Says this has happened in the past. No postictal  episode, no chest pain, no palpitations, no other red flags.  Discharge Diagnoses:  Principal Problem:   Sacral fracture ([place]) Active Problems:   HTN (hypertension)   Syncope, vasovagal   Erythropoietin deficiency anemia   Hypertensive urgency  #1 orthostatic hypotension -patient was admitted after fall which was thought to be due to orthostatic hypotension.  She suffered bilateral sacral fractures which was treated nonsurgically.  She was treated with IV fluids with improvement in her orthostatics and dizziness.  She was seen by physical therapy and recommended skilled nursing facility when discharged for ongoing rehabilitation prior to discharge home.  Patient lives alone.  At baseline she is able to walk without a walker at home.  #2 hypertension continue losartan and add Norvasc 5 mg daily during the hospital stay  #3 CKD stage IIIa stable monitor labs once a week at the nursing home.   #4 iron deficiency anemia at baseline hemoglobin 10.0 on discharge.  #5 urinary incontinence on oxybutynin  Estimated body mass index is 25.32 kg/m as calculated from the following:   Height as of this encounter: 5\' 1"  (1.549 m).   Weight as of this encounter: 60.8 kg.  Discharge Instructions  Discharge Instructions    Ambulatory referral to Orthopedic Surgery   Complete by: As directed    Sacral fractures   Diet - low sodium heart healthy   Complete by: As directed    Diet - low sodium heart healthy   Complete by: As directed    Increase activity slowly   Complete by: As directed    Increase activity slowly   Complete by: As directed  Allergies as of 02/06/2020      Reactions   Macrobid [nitrofurantoin Macrocrystal] Nausea Only      Medication List    STOP taking these medications   oxybutynin 5 MG 24 hr tablet Commonly known as: DITROPAN-XL     TAKE these medications   acetaminophen 500 MG tablet Commonly known as: TYLENOL Take 500-1,000 mg by mouth every 6 (six)  hours as needed for mild pain, fever or headache.   amLODipine 5 MG tablet Commonly known as: NORVASC Take 1 tablet (5 mg total) by mouth daily.   escitalopram 10 MG tablet Commonly known as: LEXAPRO Take 10 mg by mouth daily.   losartan 100 MG tablet Commonly known as: COZAAR Take 100 mg by mouth daily.   [place] ARIA IV Inject into the vein every 8 (eight) weeks. For RA [facility].   traMADol 50 MG tablet Commonly known as: Ultram Take 1 tablet (50 mg total) by mouth every 6 (six) hours as needed. What changed:   how much to take  reasons to take this       Contact information for follow-up providers    Seward Carol, MD Follow up.   Specialty: Internal Medicine Contact information: [address] [place] [address] [phone]        Tania Ade, MD Follow up.   Specialty: Orthopedic Surgery Contact information: [address] [place] [address] [phone]            Contact information for after-discharge care    Destination    [facility] .   Service: Skilled Nursing Contact information: [address] [place] [phone]                 Allergies  Allergen Reactions  . Macrobid [Nitrofurantoin Macrocrystal] Nausea Only    Consultations: None  Procedures/Studies: CT Lumbar Spine Wo Contrast  Result Date: 01/30/2020 CLINICAL DATA:  74 year old female status post trip and fall yesterday. Fell backwards onto buttocks 5 days ago. Low back pain. EXAM: CT LUMBAR SPINE WITHOUT CONTRAST TECHNIQUE: Multidetector CT imaging of the lumbar spine was performed without intravenous contrast administration. Multiplanar CT image reconstructions were also generated. COMPARISON:  CTA chest 06/20/2016. FINDINGS: Segmentation: Normal. Alignment: Mild to moderate levoconvex lumbar scoliosis with apex at L3-L4. Mild straightening of lumbar lordosis. Subtle  degenerative retrolisthesis of L3 on L4. Vertebrae: Background osteopenia. Lumbar and visible lower thoracic vertebrae appear intact. However, there are subtle bilateral sacral ala fractures, minimally displaced (series 3, images 102, 105, 112). SI joints remain intact. Sagittal images suggest a mildly displaced central fracture of the S3 body also. Paraspinal and other soft tissues: Negative visible costophrenic angles. Aortoiliac calcified atherosclerosis. Negative other visible abdominal viscera. Negative lumbar paraspinal soft tissues. Trace presacral inflammatory stranding bilaterally. Disc levels: Negative from the visible lower thoracic spine through L2-L3. L3-L4: Moderate to severe disc space loss with circumferential but mostly far lateral disc osteophyte complex greater on the right. Mild facet and ligament flavum hypertrophy. Mild if any spinal stenosis. Moderate to severe right L3 foraminal stenosis. L4-L5: Disc space loss with circumferential disc bulge and endplate spurring eccentric to the right. Mild to moderate facet and ligament flavum hypertrophy. Mild spinal stenosis suspected. Moderate right L4 foraminal stenosis. L5-S1: Disc space loss. Mild circumferential disc osteophyte complex eccentric to the left. Mild facet hypertrophy. Mild left L5 foraminal stenosis. IMPRESSION: 1. Osteopenia with minimally displaced bilateral sacral fractures. 2. No acute osseous  abnormality in the lumbar spine. Levoconvex lumbar scoliosis with L3-L4 through L5-S1 degeneration. 3. Aortic Atherosclerosis (ICD10-I70.0). Electronically Signed   By: Genevie Ann M.D.   On: 01/30/2020 22:30    (Echo, Carotid, EGD, Colonoscopy, ERCP)    Subjective: Patient resting in bed awake alert anxious to go to rehab  Discharge Exam: Vitals:   02/05/20 2040 02/06/20 0512  BP: (!) 175/79 (!) 171/82  Pulse: 77 84  Resp: 14 14  Temp: 98.5 F (36.9 C) 98.2 F (36.8 C)  SpO2: 98% 96%   Vitals:   02/05/20 1403 02/05/20 1900  02/05/20 2040 02/06/20 0512  BP: (!) 152/71  (!) 175/79 (!) 171/82  Pulse: 85  77 84  Resp: 17  14 14   Temp: 98.3 F (36.8 C)  98.5 F (36.9 C) 98.2 F (36.8 C)  TempSrc: Oral  Oral Oral  SpO2: 98% 98% 98% 96%  Weight:      Height:        General: Pt is alert, awake, not in acute distress Cardiovascular: RRR, S1/S2 +, no rubs, no gallops Respiratory: CTA bilaterally, no wheezing, no rhonchi Abdominal: Soft, NT, ND, bowel sounds + Extremities: no edema, no cyanosis    The results of significant diagnostics from this hospitalization (including imaging, microbiology, ancillary and laboratory) are listed below for reference.     Microbiology: Recent Results (from the past 240 hour(s))  Respiratory Panel by RT PCR (Flu A&B, Covid) - Nasopharyngeal Swab     Status: None   Collection Time: 01/30/20 11:52 PM   Specimen: Nasopharyngeal Swab  Result Value Ref Range Status   SARS Coronavirus 2 by RT PCR NEGATIVE NEGATIVE Final    Comment: (NOTE) SARS-CoV-2 target nucleic acids are NOT DETECTED.  The SARS-CoV-2 RNA is generally detectable in upper respiratoy specimens during the acute phase of infection. The lowest concentration of SARS-CoV-2 viral copies this assay can detect is 131 copies/mL. A negative result does not preclude SARS-Cov-2 infection and should not be used as the sole basis for treatment or other patient management decisions. A negative result may occur with  improper specimen collection/handling, submission of specimen other than nasopharyngeal swab, presence of viral mutation(s) within the areas targeted by this assay, and inadequate number of viral copies (<131 copies/mL). A negative result must be combined with clinical observations, patient history, and epidemiological information. The expected result is Negative.  Fact Sheet for Patients:  PinkCheek.be  Fact Sheet for Healthcare Providers:   GravelBags.it  This test is no t yet approved or cleared by the [place] FDA and  has been authorized for detection and/or diagnosis of SARS-CoV-2 by FDA under an Emergency Use Authorization (EUA). This EUA will remain  in effect (meaning this test can be used) for the duration of the COVID-19 declaration under Section 564(b)(1) of the Act, 21 U.S.C. section 360bbb-3(b)(1), unless the authorization is terminated or revoked sooner.     Influenza A by PCR NEGATIVE NEGATIVE Final   Influenza B by PCR NEGATIVE NEGATIVE Final    Comment: (NOTE) The Xpert Xpress SARS-CoV-2/FLU/RSV assay is intended as an aid in  the diagnosis of influenza from Nasopharyngeal swab specimens and  should not be used as a sole basis for treatment. Nasal washings and  aspirates are unacceptable for Xpert Xpress SARS-CoV-2/FLU/RSV  testing.  Fact Sheet for Patients: PinkCheek.be  Fact Sheet for Healthcare Providers: GravelBags.it  This test is not yet approved or cleared by the [place] FDA and  has been authorized for detection  and/or diagnosis of SARS-CoV-2 by  FDA under an Emergency Use Authorization (EUA). This EUA will remain  in effect (meaning this test can be used) for the duration of the  Covid-19 declaration under Section 564(b)(1) of the Act, 21  U.S.C. section 360bbb-3(b)(1), unless the authorization is  terminated or revoked. Performed at [facility], [address] [place], [place] [address]   SARS Coronavirus 2 by RT PCR (hospital order, performed in [facility] hospital lab) Nasopharyngeal Nasopharyngeal Swab     Status: None   Collection Time: 02/05/20  9:38 AM   Specimen: Nasopharyngeal Swab  Result Value Ref Range Status   SARS Coronavirus 2 NEGATIVE NEGATIVE Final    Comment: (NOTE) SARS-CoV-2 target nucleic acids are NOT DETECTED.  The SARS-CoV-2 RNA is generally  detectable in upper and lower respiratory specimens during the acute phase of infection. The lowest concentration of SARS-CoV-2 viral copies this assay can detect is 250 copies / mL. A negative result does not preclude SARS-CoV-2 infection and should not be used as the sole basis for treatment or other patient management decisions.  A negative result may occur with improper specimen collection / handling, submission of specimen other than nasopharyngeal swab, presence of viral mutation(s) within the areas targeted by this assay, and inadequate number of viral copies (<250 copies / mL). A negative result must be combined with clinical observations, patient history, and epidemiological information.  Fact Sheet for Patients:   StrictlyIdeas.no  Fact Sheet for Healthcare Providers: BankingDealers.co.za  This test is not yet approved or  cleared by the [place] FDA and has been authorized for detection and/or diagnosis of SARS-CoV-2 by FDA under an Emergency Use Authorization (EUA).  This EUA will remain in effect (meaning this test can be used) for the duration of the COVID-19 declaration under Section 564(b)(1) of the Act, 21 U.S.C. section 360bbb-3(b)(1), unless the authorization is terminated or revoked sooner.  Performed at [facility], [place] [address], [place], [place] [address]      Labs: BNP (last 3 results) No results for input(s): BNP in the last 8760 hours. Basic Metabolic Panel: Recent Labs  Lab 01/30/20 2352 01/31/20 0919 02/01/20 0338 02/01/20 0352 02/04/20 0317  NA 137 136 133*  --  139  K 4.0 3.4* 3.9  --  3.6  CL 103 103 99  --  108  CO2 23 21* 24  --  23  GLUCOSE 82 91 97  --  87  BUN 15 14 17   --  14  CREATININE 0.96 0.79 1.15*  --  0.89  CALCIUM 9.1 8.8* 8.4*  --  8.4*  MG  --   --   --  1.7  --   PHOS  --   --   --  3.6  --    Liver Function Tests: No results for input(s): AST,  ALT, ALKPHOS, BILITOT, PROT, ALBUMIN in the last 168 hours. No results for input(s): LIPASE, AMYLASE in the last 168 hours. No results for input(s): AMMONIA in the last 168 hours. CBC: Recent Labs  Lab 01/30/20 2352 01/31/20 0919 02/01/20 0338  WBC 11.7* 8.9 8.3  NEUTROABS 7.5  --   --   HGB 10.6* 10.4* 10.0*  HCT 30.5* 29.5* 29.5*  MCV 103.7* 103.1* 103.9*  PLT 207 206 233   Cardiac Enzymes: No results for input(s): CKTOTAL, CKMB, CKMBINDEX, TROPONINI in the last 168 hours. BNP: Invalid input(s): POCBNP CBG: No results for input(s): GLUCAP in the  last 168 hours. D-Dimer No results for input(s): DDIMER in the last 72 hours. Hgb A1c No results for input(s): HGBA1C in the last 72 hours. Lipid Profile No results for input(s): CHOL, HDL, LDLCALC, TRIG, CHOLHDL, LDLDIRECT in the last 72 hours. Thyroid function studies No results for input(s): TSH, T4TOTAL, T3FREE, THYROIDAB in the last 72 hours.  Invalid input(s): FREET3 Anemia work up No results for input(s): VITAMINB12, FOLATE, FERRITIN, TIBC, IRON, RETICCTPCT in the last 72 hours. Urinalysis    Component Value Date/Time   COLORURINE YELLOW 05/17/2013 1129   APPEARANCEUR CLEAR 05/17/2013 1129   LABSPEC 1.017 05/17/2013 1129   PHURINE 5.5 05/17/2013 1129   GLUCOSEU NEGATIVE 05/17/2013 1129   HGBUR TRACE (A) 05/17/2013 1129   BILIRUBINUR NEGATIVE 05/17/2013 1129   KETONESUR 15 (A) 05/17/2013 1129   PROTEINUR NEGATIVE 05/17/2013 1129   UROBILINOGEN 1.0 05/17/2013 1129   NITRITE NEGATIVE 05/17/2013 1129   LEUKOCYTESUR MODERATE (A) 05/17/2013 1129   Sepsis Labs Invalid input(s): PROCALCITONIN,  WBC,  LACTICIDVEN Microbiology Recent Results (from the past 240 hour(s))  Respiratory Panel by RT PCR (Flu A&B, Covid) - Nasopharyngeal Swab     Status: None   Collection Time: 01/30/20 11:52 PM   Specimen: Nasopharyngeal Swab  Result Value Ref Range Status   SARS Coronavirus 2 by RT PCR NEGATIVE NEGATIVE Final    Comment:  (NOTE) SARS-CoV-2 target nucleic acids are NOT DETECTED.  The SARS-CoV-2 RNA is generally detectable in upper respiratoy specimens during the acute phase of infection. The lowest concentration of SARS-CoV-2 viral copies this assay can detect is 131 copies/mL. A negative result does not preclude SARS-Cov-2 infection and should not be used as the sole basis for treatment or other patient management decisions. A negative result may occur with  improper specimen collection/handling, submission of specimen other than nasopharyngeal swab, presence of viral mutation(s) within the areas targeted by this assay, and inadequate number of viral copies (<131 copies/mL). A negative result must be combined with clinical observations, patient history, and epidemiological information. The expected result is Negative.  Fact Sheet for Patients:  PinkCheek.be  Fact Sheet for Healthcare Providers:  GravelBags.it  This test is no t yet approved or cleared by the [place] FDA and  has been authorized for detection and/or diagnosis of SARS-CoV-2 by FDA under an Emergency Use Authorization (EUA). This EUA will remain  in effect (meaning this test can be used) for the duration of the COVID-19 declaration under Section 564(b)(1) of the Act, 21 U.S.C. section 360bbb-3(b)(1), unless the authorization is terminated or revoked sooner.     Influenza A by PCR NEGATIVE NEGATIVE Final   Influenza B by PCR NEGATIVE NEGATIVE Final    Comment: (NOTE) The Xpert Xpress SARS-CoV-2/FLU/RSV assay is intended as an aid in  the diagnosis of influenza from Nasopharyngeal swab specimens and  should not be used as a sole basis for treatment. Nasal washings and  aspirates are unacceptable for Xpert Xpress SARS-CoV-2/FLU/RSV  testing.  Fact Sheet for Patients: PinkCheek.be  Fact Sheet for Healthcare  Providers: GravelBags.it  This test is not yet approved or cleared by the [place] FDA and  has been authorized for detection and/or diagnosis of SARS-CoV-2 by  FDA under an Emergency Use Authorization (EUA). This EUA will remain  in effect (meaning this test can be used) for the duration of the  Covid-19 declaration under Section 564(b)(1) of the Act, 21  U.S.C. section 360bbb-3(b)(1), unless the authorization is  terminated or revoked. Performed at [facility], [address] [place], [place] [address]   SARS Coronavirus 2 by RT PCR (hospital order, performed in [facility] hospital lab) Nasopharyngeal Nasopharyngeal Swab     Status: None   Collection Time: 02/05/20  9:38 AM   Specimen: Nasopharyngeal Swab  Result Value Ref Range Status   SARS Coronavirus 2 NEGATIVE NEGATIVE Final    Comment: (NOTE) SARS-CoV-2 target nucleic acids are NOT DETECTED.  The SARS-CoV-2 RNA is generally detectable in upper and lower respiratory specimens during the acute phase of infection. The lowest concentration of SARS-CoV-2 viral copies this assay can detect is 250 copies / mL. A negative result does not preclude SARS-CoV-2 infection and should not be used as the sole basis for treatment or other patient management decisions.  A negative result may occur with improper specimen collection / handling, submission of specimen other than nasopharyngeal swab, presence of viral mutation(s) within the areas targeted by this assay, and inadequate number of viral copies (<250 copies / mL). A negative result must be combined with clinical observations, patient history, and epidemiological information.  Fact Sheet for Patients:   StrictlyIdeas.no  Fact Sheet for Healthcare Providers: BankingDealers.co.za  This test is not yet approved or  cleared by the [place] FDA and has been authorized for detection  and/or diagnosis of SARS-CoV-2 by FDA under an Emergency Use Authorization (EUA).  This EUA will remain in effect (meaning this test can be used) for the duration of the COVID-19 declaration under Section 564(b)(1) of the Act, 21 U.S.C. section 360bbb-3(b)(1), unless the authorization is terminated or revoked sooner.  Performed at [facility], [place] [address] [place], [place] [address]    37 min  SIGNED:   Georgette Shell, MD  [facility] 02/06/2020, 9:21 AM

## 2020-02-06 NOTE — Plan of Care (Signed)

## 2020-02-06 NOTE — Care Management Important Message (Signed)
Important Message  Patient Details IM Letter mailed to the Patient Name: Mallory Murphy MRN: [id number] Date of Birth: 09/12/1945   Medicare Important Message Given:  Yes     Kerin Salen 02/06/2020, 1:27 PM

## 2020-02-06 NOTE — TOC Transition Note (Signed)
Transition of Care [facility]) - CM/SW Discharge Note   Patient Details  Name: Mallory Murphy MRN: [id number] Date of Birth: 1945-11-10  [facility] of [facility]) CM/SW Contact:  Joaquin Courts, RN Phone Number: 02/06/2020, 11:00 AM   Clinical Narrative:    CM confirmed bed availability for discharge to [name] today.  Patient will discharge to room 807B.  Bedside RN please call report to [phone].  PTAR transportation arranged. Patient is aware of discharge plan for today.    Final next level of care: Skilled Nursing Facility Barriers to Discharge: No Barriers Identified   Patient Goals and CMS Choice   CMS Medicare.gov Compare Post Acute Care list provided to:: Other (Comment Required) Choice offered to / list presented to : Patient  Discharge Placement                       Discharge Plan and Services In-house Referral: Clinical Social Work Discharge Planning Services: CM Consult Post Acute Care Choice: Skilled Nursing Facility                               Social Determinants of Health (SDOH) Interventions     Readmission Risk Interventions No flowsheet data found.

## 2020-02-20 ENCOUNTER — Inpatient Hospital Stay: Payer: Medicare Other

## 2020-02-21 ENCOUNTER — Telehealth: Payer: Self-pay

## 2020-02-21 NOTE — Telephone Encounter (Signed)
Called and left a message with new appt r/s from 03/21/20 to 03/23/20.Marland Kitchen AOM

## 2020-03-01 ENCOUNTER — Telehealth: Payer: Self-pay

## 2020-03-01 NOTE — Telephone Encounter (Signed)
Daughter called to r/s missed appts from hosp/rehab....   AOM

## 2020-03-06 ENCOUNTER — Other Ambulatory Visit: Payer: Self-pay

## 2020-03-06 ENCOUNTER — Inpatient Hospital Stay (HOSPITAL_BASED_OUTPATIENT_CLINIC_OR_DEPARTMENT_OTHER): Payer: Medicare Other | Admitting: Family

## 2020-03-06 ENCOUNTER — Inpatient Hospital Stay: Payer: Medicare Other

## 2020-03-06 ENCOUNTER — Telehealth: Payer: Self-pay

## 2020-03-06 ENCOUNTER — Inpatient Hospital Stay: Payer: Medicare Other | Attending: Hematology & Oncology

## 2020-03-06 ENCOUNTER — Encounter: Payer: Self-pay | Admitting: Family

## 2020-03-06 VITALS — BP 109/67 | HR 85 | Temp 98.3°F | Resp 18 | Ht 61.0 in

## 2020-03-06 DIAGNOSIS — N189 Chronic kidney disease, unspecified: Secondary | ICD-10-CM | POA: Insufficient documentation

## 2020-03-06 DIAGNOSIS — D631 Anemia in chronic kidney disease: Secondary | ICD-10-CM

## 2020-03-06 DIAGNOSIS — D500 Iron deficiency anemia secondary to blood loss (chronic): Secondary | ICD-10-CM

## 2020-03-06 DIAGNOSIS — D472 Monoclonal gammopathy: Secondary | ICD-10-CM

## 2020-03-06 LAB — CBC WITH DIFFERENTIAL (CANCER CENTER ONLY)
Abs Immature Granulocytes: 0.03 10*3/uL (ref 0.00–0.07)
Basophils Absolute: 0 10*3/uL (ref 0.0–0.1)
Basophils Relative: 0 %
Eosinophils Absolute: 0.4 10*3/uL (ref 0.0–0.5)
Eosinophils Relative: 4 %
HCT: 30.1 % — ABNORMAL LOW (ref 36.0–46.0)
Hemoglobin: 9.9 g/dL — ABNORMAL LOW (ref 12.0–15.0)
Immature Granulocytes: 0 %
Lymphocytes Relative: 23 %
Lymphs Abs: 2.1 10*3/uL (ref 0.7–4.0)
MCH: 34.9 pg — ABNORMAL HIGH (ref 26.0–34.0)
MCHC: 32.9 g/dL (ref 30.0–36.0)
MCV: 106 fL — ABNORMAL HIGH (ref 80.0–100.0)
Monocytes Absolute: 0.8 10*3/uL (ref 0.1–1.0)
Monocytes Relative: 9 %
Neutro Abs: 5.7 10*3/uL (ref 1.7–7.7)
Neutrophils Relative %: 64 %
Platelet Count: 330 10*3/uL (ref 150–400)
RBC: 2.84 MIL/uL — ABNORMAL LOW (ref 3.87–5.11)
RDW: 13.7 % (ref 11.5–15.5)
WBC Count: 8.9 10*3/uL (ref 4.0–10.5)
nRBC: 0 % (ref 0.0–0.2)

## 2020-03-06 LAB — IRON AND TIBC
Iron: 50 ug/dL (ref 41–142)
Saturation Ratios: 25 % (ref 21–57)
TIBC: 200 ug/dL — ABNORMAL LOW (ref 236–444)
UIBC: 150 ug/dL (ref 120–384)

## 2020-03-06 LAB — CMP (CANCER CENTER ONLY)
ALT: 8 U/L (ref 0–44)
AST: 16 U/L (ref 15–41)
Albumin: 3.8 g/dL (ref 3.5–5.0)
Alkaline Phosphatase: 115 U/L (ref 38–126)
Anion gap: 9 (ref 5–15)
BUN: 15 mg/dL (ref 8–23)
CO2: 27 mmol/L (ref 22–32)
Calcium: 9.6 mg/dL (ref 8.9–10.3)
Chloride: 100 mmol/L (ref 98–111)
Creatinine: 1.06 mg/dL — ABNORMAL HIGH (ref 0.44–1.00)
GFR, Estimated: 55 mL/min — ABNORMAL LOW (ref >60)
Glucose, Bld: 98 mg/dL (ref 70–99)
Potassium: 3.9 mmol/L (ref 3.5–5.1)
Sodium: 136 mmol/L (ref 135–145)
Total Bilirubin: 0.3 mg/dL (ref 0.3–1.2)
Total Protein: 8.1 g/dL (ref 6.5–8.1)

## 2020-03-06 LAB — RETICULOCYTES
Immature Retic Fract: 14.8 % (ref 2.3–15.9)
RBC.: 2.8 MIL/uL — ABNORMAL LOW (ref 3.87–5.11)
Retic Count, Absolute: 33 10*3/uL (ref 19.0–186.0)
Retic Ct Pct: 1.2 % (ref 0.4–3.1)

## 2020-03-06 LAB — FERRITIN: Ferritin: 225 ng/mL (ref 11–307)

## 2020-03-06 MED ORDER — DARBEPOETIN ALFA 300 MCG/0.6ML IJ SOSY
300.0000 ug | PREFILLED_SYRINGE | Freq: Once | INTRAMUSCULAR | Status: AC
  Administered 2020-03-06: 300 ug via SUBCUTANEOUS

## 2020-03-06 MED ORDER — LANREOTIDE ACETATE 120 MG/0.5ML ~~LOC~~ SOLN
SUBCUTANEOUS | Status: AC
  Filled 2020-03-06: qty 120

## 2020-03-06 MED ORDER — EPOETIN ALFA-EPBX 10000 UNIT/ML IJ SOLN
INTRAMUSCULAR | Status: AC
  Filled 2020-03-06: qty 4

## 2020-03-06 MED ORDER — EPOETIN ALFA-EPBX 40000 UNIT/ML IJ SOLN
INTRAMUSCULAR | Status: AC
  Filled 2020-03-06: qty 3

## 2020-03-06 MED ORDER — DARBEPOETIN ALFA 300 MCG/0.6ML IJ SOSY
PREFILLED_SYRINGE | INTRAMUSCULAR | Status: AC
  Filled 2020-03-06: qty 0.6

## 2020-03-06 MED ORDER — CYANOCOBALAMIN 1000 MCG/ML IJ SOLN
INTRAMUSCULAR | Status: AC
  Filled 2020-03-06: qty 1

## 2020-03-06 NOTE — Progress Notes (Signed)
Hematology and Oncology Follow Up Visit  Mallory Murphy [id number] [birth year] 74 y.o. 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 She had a syncopal episode in October due to orthostatic hypotension and fell and fractured her sacrum. She is currently doing in home PT 2 days a week and recuperating nicely.  She had HTN with a systolic in the 517'G during her hospital admission and is waiting to schedule an appointment for follow-up her PCP. She is currently on Norvasc and Cozaar to keep this in check but still has episodes of dizziness and hypotension when she stands up and has to be very careful. No new syncopal episodes or fells to report.  Hgb today is 9.9, WBC count 8.9 and platelets 330.  No fever, chills, n/v, cough, rash, SOB, chest pain, palpitations, abdominal pain or changes in bowel or bladder habits.  No episodes of blood loss to report. No abnormal bruising, no petechiae.  No swelling, tenderness, numbness or tingling in her extremities.  She states that she does not have an appetite but is eating 2 meals a day. He niece is making sure to encourage her to hydrate as well.   ECOG Performance Status: 2 - Symptomatic, <50% confined to bed  Medications:  Allergies as of 03/06/2020      Reactions   Macrobid [nitrofurantoin Macrocrystal] Nausea Only      Medication List       Accurate as of March 06, 2020 10:13 AM. If you have any questions, ask your nurse or doctor.        acetaminophen 500 MG tablet Commonly known as: TYLENOL Take 500-1,000 mg by mouth every 6 (six) hours as needed for mild pain, fever or headache.   amLODipine 5 MG tablet Commonly known as: NORVASC Take 1 tablet (5 mg total) by mouth daily.     escitalopram 10 MG tablet Commonly known as: LEXAPRO Take 10 mg by mouth daily.   losartan 100 MG tablet Commonly known as: COZAAR Take 100 mg by mouth daily.   [place] ARIA IV Inject into the vein every 8 (eight) weeks. For RA [facility] Rhematology.   traMADol 50 MG tablet Commonly known as: Ultram Take 1 tablet (50 mg total) by mouth every 6 (six) hours as needed.       Allergies:  Allergies  Allergen Reactions  . Macrobid [Nitrofurantoin Macrocrystal] Nausea Only    Past Medical History, Surgical history, Social history, and Family History were reviewed and updated.  Review of Systems: All other 10 point review of systems is negative.   Physical Exam:  vitals were not taken for this visit.   Wt Readings from Last 3 Encounters:  01/30/20 134 lb (60.8 kg)  01/19/20 134 lb 6.4 oz (61 kg)  11/18/19 138 lb 12.8 oz (63 kg)    Ocular: Sclerae unicteric, pupils equal, round and reactive to light Ear-nose-throat: Oropharynx clear, dentition fair Lymphatic: No cervical or supraclavicular adenopathy Lungs no rales or rhonchi, good excursion bilaterally Heart regular rate and rhythm, no murmur appreciated Abd soft, nontender, positive bowel sounds MSK no focal spinal tenderness, no joint edema Neuro: non-focal, well-oriented, appropriate affect Breasts: Deferred   Lab Results  Component Value Date   WBC 8.9 03/06/2020   HGB 9.9 (L) 03/06/2020  HCT 30.1 (L) 03/06/2020   MCV 106.0 (H) 03/06/2020   PLT 330 03/06/2020   Lab Results  Component Value Date   FERRITIN 202 01/19/2020   IRON 97 01/19/2020   TIBC 200 (L) 01/19/2020   UIBC 103 (L) 01/19/2020   IRONPCTSAT 49 01/19/2020   Lab Results  Component Value Date   RETICCTPCT 1.2 03/06/2020   RBC 2.80 (L) 03/06/2020   Lab Results  Component Value Date   KPAFRELGTCHN 56.3 (H) 08/24/2019   LAMBDASER 43.4 (H) 08/24/2019   KAPLAMBRATIO 1.30 08/24/2019   Lab Results  Component Value Date   IGGSERUM  2,201 (H) 08/24/2019   IGA 386 08/24/2019   IGMSERUM 109 08/24/2019   Lab Results  Component Value Date   TOTALPROTELP 7.5 08/24/2019   ALBUMINELP 3.5 08/24/2019   A1GS 0.3 08/24/2019   A2GS 0.8 08/24/2019   BETS 1.0 08/24/2019   GAMS 2.0 (H) 08/24/2019   MSPIKE 1.0 (H) 08/24/2019   SPEI Comment 04/22/2019     Chemistry      Component Value Date/Time   NA 139 02/04/2020 0317   K 3.6 02/04/2020 0317   CL 108 02/04/2020 0317   CO2 23 02/04/2020 0317   BUN 14 02/04/2020 0317   CREATININE 0.89 02/04/2020 0317   CREATININE 1.12 (H) 01/19/2020 1329   CREATININE 1.19 (H) 05/07/2012 1553      Component Value Date/Time   CALCIUM 8.4 (L) 02/04/2020 0317   ALKPHOS 77 01/19/2020 1329   AST 19 01/19/2020 1329   ALT 8 01/19/2020 1329   BILITOT 0.4 01/19/2020 1329       Impression and Plan: Mallory Murphy is a very pleasant 74 yo caucasian female with multifactorial anemiaas well as IgG kappa MGUS. She received Aranesp today for Hgb  9.9.  Protein studies were drawn today and results are pending.  We will see what her iron studies look like and replace as well if needed.  Lab and injection in 3 weeks, follow-up in 6 weeks.  She is in agreement with the plan and was encouraged to contact our office with any questions or concerns.   Laverna Peace, NP 11/23/202110:13 AM

## 2020-03-06 NOTE — Telephone Encounter (Signed)
appts made and printed for pt per 03/06/20 los.... AOM

## 2020-03-06 NOTE — Progress Notes (Signed)
Pt discharged in no apparent distress. Pt left in a wheelchair with her niece. Pt aware of discharge instructions and verbalized understanding and had no further questions.

## 2020-03-06 NOTE — Patient Instructions (Signed)
Darbepoetin Alfa injection What is this medicine? DARBEPOETIN ALFA (dar be POE e tin AL fa) helps your body make more red blood cells. It is used to treat anemia caused by chronic kidney failure and chemotherapy. This medicine may be used for other purposes; ask your health care provider or pharmacist if you have questions. COMMON BRAND NAME(S): Aranesp What should I tell my health care provider before I take this medicine? They need to know if you have any of these conditions:  blood clotting disorders or history of blood clots  cancer patient not on chemotherapy  cystic fibrosis  heart disease, such as angina, heart failure, or a history of a heart attack  hemoglobin level of 12 g/dL or greater  high blood pressure  low levels of folate, iron, or vitamin B12  seizures  an unusual or allergic reaction to darbepoetin, erythropoietin, albumin, hamster proteins, latex, other medicines, foods, dyes, or preservatives  pregnant or trying to get pregnant  breast-feeding How should I use this medicine? This medicine is for injection into a vein or under the skin. It is usually given by a health care professional in a hospital or clinic setting. If you get this medicine at home, you will be taught how to prepare and give this medicine. Use exactly as directed. Take your medicine at regular intervals. Do not take your medicine more often than directed. It is important that you put your used needles and syringes in a special sharps container. Do not put them in a trash can. If you do not have a sharps container, call your pharmacist or healthcare provider to get one. A special MedGuide will be given to you by the pharmacist with each prescription and refill. Be sure to read this information carefully each time. Talk to your pediatrician regarding the use of this medicine in children. While this medicine may be used in children as young as 1 month of age for selected conditions, precautions do  apply. Overdosage: If you think you have taken too much of this medicine contact a poison control center or emergency room at once. NOTE: This medicine is only for you. Do not share this medicine with others. What if I miss a dose? If you miss a dose, take it as soon as you can. If it is almost time for your next dose, take only that dose. Do not take double or extra doses. What may interact with this medicine? Do not take this medicine with any of the following medications:  epoetin alfa This list may not describe all possible interactions. Give your health care provider a list of all the medicines, herbs, non-prescription drugs, or dietary supplements you use. Also tell them if you smoke, drink alcohol, or use illegal drugs. Some items may interact with your medicine. What should I watch for while using this medicine? Your condition will be monitored carefully while you are receiving this medicine. You may need blood work done while you are taking this medicine. This medicine may cause a decrease in vitamin B6. You should make sure that you get enough vitamin B6 while you are taking this medicine. Discuss the foods you eat and the vitamins you take with your health care professional. What side effects may I notice from receiving this medicine? Side effects that you should report to your doctor or health care professional as soon as possible:  allergic reactions like skin rash, itching or hives, swelling of the face, lips, or tongue  breathing problems  changes in   vision  chest pain  confusion, trouble speaking or understanding  feeling faint or lightheaded, falls  high blood pressure  muscle aches or pains  pain, swelling, warmth in the leg  rapid weight gain  severe headaches  sudden numbness or weakness of the face, arm or leg  trouble walking, dizziness, loss of balance or coordination  seizures (convulsions)  swelling of the ankles, feet, hands  unusually weak or  tired Side effects that usually do not require medical attention (report to your doctor or health care professional if they continue or are bothersome):  diarrhea  fever, chills (flu-like symptoms)  headaches  nausea, vomiting  redness, stinging, or swelling at site where injected This list may not describe all possible side effects. Call your doctor for medical advice about side effects. You may report side effects to FDA at 1-800-FDA-1088. Where should I keep my medicine? Keep out of the reach of children. Store in a refrigerator between 2 and 8 degrees C (36 and 46 degrees F). Do not freeze. Do not shake. Throw away any unused portion if using a single-dose vial. Throw away any unused medicine after the expiration date. NOTE: This sheet is a summary. It may not cover all possible information. If you have questions about this medicine, talk to your doctor, pharmacist, or health care provider.  2020 Elsevier/Gold Standard (2017-04-15 16:44:20)  

## 2020-03-07 LAB — IGG, IGA, IGM
IgA: 389 mg/dL (ref 64–422)
IgG (Immunoglobin G), Serum: 2208 mg/dL — ABNORMAL HIGH (ref 586–1602)
IgM (Immunoglobulin M), Srm: 100 mg/dL (ref 26–217)

## 2020-03-07 LAB — PROTEIN ELECTROPHORESIS, SERUM
A/G Ratio: 0.8 (ref 0.7–1.7)
Albumin ELP: 3.3 g/dL (ref 2.9–4.4)
Alpha-1-Globulin: 0.3 g/dL (ref 0.0–0.4)
Alpha-2-Globulin: 0.9 g/dL (ref 0.4–1.0)
Beta Globulin: 1 g/dL (ref 0.7–1.3)
Gamma Globulin: 2 g/dL — ABNORMAL HIGH (ref 0.4–1.8)
Globulin, Total: 4.2 g/dL — ABNORMAL HIGH (ref 2.2–3.9)
M-Spike, %: 1 g/dL — ABNORMAL HIGH
Total Protein ELP: 7.5 g/dL (ref 6.0–8.5)

## 2020-03-07 LAB — KAPPA/LAMBDA LIGHT CHAINS
Kappa free light chain: 75.8 mg/L — ABNORMAL HIGH (ref 3.3–19.4)
Kappa, lambda light chain ratio: 1.71 — ABNORMAL HIGH (ref 0.26–1.65)
Lambda free light chains: 44.2 mg/L — ABNORMAL HIGH (ref 5.7–26.3)

## 2020-03-21 ENCOUNTER — Ambulatory Visit: Payer: Medicare Other | Admitting: Family

## 2020-03-21 ENCOUNTER — Inpatient Hospital Stay: Payer: Medicare Other

## 2020-03-23 ENCOUNTER — Inpatient Hospital Stay: Payer: Medicare Other

## 2020-03-23 ENCOUNTER — Inpatient Hospital Stay: Payer: Medicare Other | Admitting: Family

## 2020-03-27 ENCOUNTER — Inpatient Hospital Stay: Payer: Medicare Other

## 2020-03-27 ENCOUNTER — Inpatient Hospital Stay: Payer: Medicare Other | Attending: Hematology & Oncology

## 2020-03-27 ENCOUNTER — Other Ambulatory Visit: Payer: Self-pay

## 2020-03-27 VITALS — BP 125/57 | HR 86 | Temp 98.2°F | Resp 18

## 2020-03-27 DIAGNOSIS — N189 Chronic kidney disease, unspecified: Secondary | ICD-10-CM | POA: Insufficient documentation

## 2020-03-27 DIAGNOSIS — D500 Iron deficiency anemia secondary to blood loss (chronic): Secondary | ICD-10-CM

## 2020-03-27 DIAGNOSIS — D631 Anemia in chronic kidney disease: Secondary | ICD-10-CM | POA: Diagnosis present

## 2020-03-27 LAB — CMP (CANCER CENTER ONLY)
ALT: 7 U/L (ref 0–44)
AST: 17 U/L (ref 15–41)
Albumin: 3.6 g/dL (ref 3.5–5.0)
Alkaline Phosphatase: 87 U/L (ref 38–126)
Anion gap: 9 (ref 5–15)
BUN: 18 mg/dL (ref 8–23)
CO2: 26 mmol/L (ref 22–32)
Calcium: 9.2 mg/dL (ref 8.9–10.3)
Chloride: 104 mmol/L (ref 98–111)
Creatinine: 1.82 mg/dL — ABNORMAL HIGH (ref 0.44–1.00)
GFR, Estimated: 29 mL/min — ABNORMAL LOW (ref >60)
Glucose, Bld: 124 mg/dL — ABNORMAL HIGH (ref 70–99)
Potassium: 3.6 mmol/L (ref 3.5–5.1)
Sodium: 139 mmol/L (ref 135–145)
Total Bilirubin: 0.4 mg/dL (ref 0.3–1.2)
Total Protein: 7.4 g/dL (ref 6.5–8.1)

## 2020-03-27 LAB — CBC WITH DIFFERENTIAL (CANCER CENTER ONLY)
Abs Immature Granulocytes: 0.02 10*3/uL (ref 0.00–0.07)
Basophils Absolute: 0 10*3/uL (ref 0.0–0.1)
Basophils Relative: 0 %
Eosinophils Absolute: 0.3 10*3/uL (ref 0.0–0.5)
Eosinophils Relative: 3 %
HCT: 32.8 % — ABNORMAL LOW (ref 36.0–46.0)
Hemoglobin: 10.6 g/dL — ABNORMAL LOW (ref 12.0–15.0)
Immature Granulocytes: 0 %
Lymphocytes Relative: 32 %
Lymphs Abs: 2.5 10*3/uL (ref 0.7–4.0)
MCH: 34.4 pg — ABNORMAL HIGH (ref 26.0–34.0)
MCHC: 32.3 g/dL (ref 30.0–36.0)
MCV: 106.5 fL — ABNORMAL HIGH (ref 80.0–100.0)
Monocytes Absolute: 0.7 10*3/uL (ref 0.1–1.0)
Monocytes Relative: 10 %
Neutro Abs: 4.2 10*3/uL (ref 1.7–7.7)
Neutrophils Relative %: 55 %
Platelet Count: 218 10*3/uL (ref 150–400)
RBC: 3.08 MIL/uL — ABNORMAL LOW (ref 3.87–5.11)
RDW: 15 % (ref 11.5–15.5)
WBC Count: 7.7 10*3/uL (ref 4.0–10.5)
nRBC: 0 % (ref 0.0–0.2)

## 2020-03-27 MED ORDER — DARBEPOETIN ALFA 300 MCG/0.6ML IJ SOSY
PREFILLED_SYRINGE | INTRAMUSCULAR | Status: AC
  Filled 2020-03-27: qty 0.6

## 2020-03-27 MED ORDER — DARBEPOETIN ALFA 300 MCG/0.6ML IJ SOSY
300.0000 ug | PREFILLED_SYRINGE | Freq: Once | INTRAMUSCULAR | Status: AC
  Administered 2020-03-27: 300 ug via SUBCUTANEOUS

## 2020-03-27 NOTE — Patient Instructions (Signed)
Darbepoetin Alfa injection What is this medicine? DARBEPOETIN ALFA (dar be POE e tin AL fa) helps your body make more red blood cells. It is used to treat anemia caused by chronic kidney failure and chemotherapy. This medicine may be used for other purposes; ask your health care provider or pharmacist if you have questions. COMMON BRAND NAME(S): Aranesp What should I tell my health care provider before I take this medicine? They need to know if you have any of these conditions:  blood clotting disorders or history of blood clots  cancer patient not on chemotherapy  cystic fibrosis  heart disease, such as angina, heart failure, or a history of a heart attack  hemoglobin level of 12 g/dL or greater  high blood pressure  low levels of folate, iron, or vitamin B12  seizures  an unusual or allergic reaction to darbepoetin, erythropoietin, albumin, hamster proteins, latex, other medicines, foods, dyes, or preservatives  pregnant or trying to get pregnant  breast-feeding How should I use this medicine? This medicine is for injection into a vein or under the skin. It is usually given by a health care professional in a hospital or clinic setting. If you get this medicine at home, you will be taught how to prepare and give this medicine. Use exactly as directed. Take your medicine at regular intervals. Do not take your medicine more often than directed. It is important that you put your used needles and syringes in a special sharps container. Do not put them in a trash can. If you do not have a sharps container, call your pharmacist or healthcare provider to get one. A special MedGuide will be given to you by the pharmacist with each prescription and refill. Be sure to read this information carefully each time. Talk to your pediatrician regarding the use of this medicine in children. While this medicine may be used in children as young as 1 month of age for selected conditions, precautions do  apply. Overdosage: If you think you have taken too much of this medicine contact a poison control center or emergency room at once. NOTE: This medicine is only for you. Do not share this medicine with others. What if I miss a dose? If you miss a dose, take it as soon as you can. If it is almost time for your next dose, take only that dose. Do not take double or extra doses. What may interact with this medicine? Do not take this medicine with any of the following medications:  epoetin alfa This list may not describe all possible interactions. Give your health care provider a list of all the medicines, herbs, non-prescription drugs, or dietary supplements you use. Also tell them if you smoke, drink alcohol, or use illegal drugs. Some items may interact with your medicine. What should I watch for while using this medicine? Your condition will be monitored carefully while you are receiving this medicine. You may need blood work done while you are taking this medicine. This medicine may cause a decrease in vitamin B6. You should make sure that you get enough vitamin B6 while you are taking this medicine. Discuss the foods you eat and the vitamins you take with your health care professional. What side effects may I notice from receiving this medicine? Side effects that you should report to your doctor or health care professional as soon as possible:  allergic reactions like skin rash, itching or hives, swelling of the face, lips, or tongue  breathing problems  changes in   vision  chest pain  confusion, trouble speaking or understanding  feeling faint or lightheaded, falls  high blood pressure  muscle aches or pains  pain, swelling, warmth in the leg  rapid weight gain  severe headaches  sudden numbness or weakness of the face, arm or leg  trouble walking, dizziness, loss of balance or coordination  seizures (convulsions)  swelling of the ankles, feet, hands  unusually weak or  tired Side effects that usually do not require medical attention (report to your doctor or health care professional if they continue or are bothersome):  diarrhea  fever, chills (flu-like symptoms)  headaches  nausea, vomiting  redness, stinging, or swelling at site where injected This list may not describe all possible side effects. Call your doctor for medical advice about side effects. You may report side effects to FDA at 1-800-FDA-1088. Where should I keep my medicine? Keep out of the reach of children. Store in a refrigerator between 2 and 8 degrees C (36 and 46 degrees F). Do not freeze. Do not shake. Throw away any unused portion if using a single-dose vial. Throw away any unused medicine after the expiration date. NOTE: This sheet is a summary. It may not cover all possible information. If you have questions about this medicine, talk to your doctor, pharmacist, or health care provider.  2020 Elsevier/Gold Standard (2017-04-15 16:44:20)  

## 2020-04-05 ENCOUNTER — Other Ambulatory Visit: Payer: Medicare Other

## 2020-04-17 ENCOUNTER — Inpatient Hospital Stay: Payer: Medicare Other | Attending: Hematology & Oncology

## 2020-04-17 ENCOUNTER — Inpatient Hospital Stay: Payer: Medicare Other | Admitting: Family

## 2020-04-17 ENCOUNTER — Inpatient Hospital Stay (HOSPITAL_BASED_OUTPATIENT_CLINIC_OR_DEPARTMENT_OTHER): Payer: Medicare Other | Admitting: Family

## 2020-04-17 ENCOUNTER — Other Ambulatory Visit: Payer: Self-pay

## 2020-04-17 ENCOUNTER — Inpatient Hospital Stay: Payer: Medicare Other

## 2020-04-17 ENCOUNTER — Encounter: Payer: Self-pay | Admitting: Family

## 2020-04-17 ENCOUNTER — Telehealth: Payer: Self-pay

## 2020-04-17 VITALS — BP 117/54 | HR 88 | Temp 97.8°F | Resp 18 | Ht 61.0 in | Wt 127.8 lb

## 2020-04-17 DIAGNOSIS — D631 Anemia in chronic kidney disease: Secondary | ICD-10-CM | POA: Diagnosis present

## 2020-04-17 DIAGNOSIS — N189 Chronic kidney disease, unspecified: Secondary | ICD-10-CM | POA: Diagnosis not present

## 2020-04-17 DIAGNOSIS — D500 Iron deficiency anemia secondary to blood loss (chronic): Secondary | ICD-10-CM | POA: Diagnosis not present

## 2020-04-17 DIAGNOSIS — E611 Iron deficiency: Secondary | ICD-10-CM | POA: Diagnosis not present

## 2020-04-17 DIAGNOSIS — D509 Iron deficiency anemia, unspecified: Secondary | ICD-10-CM | POA: Insufficient documentation

## 2020-04-17 LAB — CMP (CANCER CENTER ONLY)
ALT: 7 U/L (ref 0–44)
AST: 17 U/L (ref 15–41)
Albumin: 3.5 g/dL (ref 3.5–5.0)
Alkaline Phosphatase: 76 U/L (ref 38–126)
Anion gap: 6 (ref 5–15)
BUN: 15 mg/dL (ref 8–23)
CO2: 28 mmol/L (ref 22–32)
Calcium: 9.3 mg/dL (ref 8.9–10.3)
Chloride: 103 mmol/L (ref 98–111)
Creatinine: 1.1 mg/dL — ABNORMAL HIGH (ref 0.44–1.00)
GFR, Estimated: 53 mL/min — ABNORMAL LOW (ref >60)
Glucose, Bld: 107 mg/dL — ABNORMAL HIGH (ref 70–99)
Potassium: 3.5 mmol/L (ref 3.5–5.1)
Sodium: 137 mmol/L (ref 135–145)
Total Bilirubin: 0.5 mg/dL (ref 0.3–1.2)
Total Protein: 7.4 g/dL (ref 6.5–8.1)

## 2020-04-17 LAB — CBC WITH DIFFERENTIAL (CANCER CENTER ONLY)
Abs Immature Granulocytes: 0.02 10*3/uL (ref 0.00–0.07)
Basophils Absolute: 0 10*3/uL (ref 0.0–0.1)
Basophils Relative: 0 %
Eosinophils Absolute: 0.2 10*3/uL (ref 0.0–0.5)
Eosinophils Relative: 2 %
HCT: 31.8 % — ABNORMAL LOW (ref 36.0–46.0)
Hemoglobin: 10.7 g/dL — ABNORMAL LOW (ref 12.0–15.0)
Immature Granulocytes: 0 %
Lymphocytes Relative: 27 %
Lymphs Abs: 2.3 10*3/uL (ref 0.7–4.0)
MCH: 35 pg — ABNORMAL HIGH (ref 26.0–34.0)
MCHC: 33.6 g/dL (ref 30.0–36.0)
MCV: 103.9 fL — ABNORMAL HIGH (ref 80.0–100.0)
Monocytes Absolute: 0.8 10*3/uL (ref 0.1–1.0)
Monocytes Relative: 9 %
Neutro Abs: 5.4 10*3/uL (ref 1.7–7.7)
Neutrophils Relative %: 62 %
Platelet Count: 212 10*3/uL (ref 150–400)
RBC: 3.06 MIL/uL — ABNORMAL LOW (ref 3.87–5.11)
RDW: 16.6 % — ABNORMAL HIGH (ref 11.5–15.5)
WBC Count: 8.6 10*3/uL (ref 4.0–10.5)
nRBC: 0 % (ref 0.0–0.2)

## 2020-04-17 LAB — RETICULOCYTES
Immature Retic Fract: 11.2 % (ref 2.3–15.9)
RBC.: 3.08 MIL/uL — ABNORMAL LOW (ref 3.87–5.11)
Retic Count, Absolute: 40.3 10*3/uL (ref 19.0–186.0)
Retic Ct Pct: 1.3 % (ref 0.4–3.1)

## 2020-04-17 MED ORDER — DARBEPOETIN ALFA 300 MCG/0.6ML IJ SOSY
PREFILLED_SYRINGE | INTRAMUSCULAR | Status: AC
  Filled 2020-04-17: qty 0.6

## 2020-04-17 MED ORDER — DARBEPOETIN ALFA 300 MCG/0.6ML IJ SOSY
300.0000 ug | PREFILLED_SYRINGE | Freq: Once | INTRAMUSCULAR | Status: AC
  Administered 2020-04-17: 300 ug via SUBCUTANEOUS

## 2020-04-17 NOTE — Progress Notes (Signed)
Hematology and Oncology Follow Up Visit  Mallory Murphy [id number] [birth year] 75 y.o. 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 She is doing well. She finished PT yesterday and is now on observation. She states that she will continue to do her workouts herself at home. She has not noted any blood loss. No abnormal bruising, no petechiae.  Hgb is stable at 10.7, WBC count 8.6 and platelets 212.  M-spike in November was stable at 1.0 g/dL, IgG level 6,433 mg/dL and kappa light chains 7.58 mg/dL.  No fever, chills, n/v, cough, rash, dizziness, SOB, chest pain, palpitations, abdominal pain or changes in bowel or bladder habits at this time.  She takes a laxative as needed for constipation.  No swelling, tenderness, numbness or tingling in her extremities.  No new falls, no syncope.  Appetite is a little better but she admits that she could hydrate a little more throughout the day.   ECOG Performance Status: 1 - Symptomatic but completely ambulatory  Medications:  Allergies as of 04/17/2020      Reactions   Macrobid [nitrofurantoin Macrocrystal] Nausea Only      Medication List       Accurate as of April 17, 2020  2:57 PM. If you have any questions, ask your nurse or doctor.        STOP taking these medications   amLODipine 5 MG tablet Commonly known as: NORVASC Stopped by: Mallory Gins, NP     TAKE these medications   acetaminophen 500 MG tablet Commonly known as: TYLENOL Take 500-1,000 mg by mouth every 6 (six) hours as needed for mild pain, fever or headache.   escitalopram 10 MG tablet Commonly known as: LEXAPRO Take 10 mg by mouth daily.   losartan 100 MG tablet Commonly known as: COZAAR Take 100 mg by mouth  daily.   oxybutynin 5 MG tablet Commonly known as: DITROPAN Take 5 mg by mouth daily.   [place] ARIA IV Inject into the vein every 8 (eight) weeks. For RA [facility] Rhematology.   traMADol 50 MG tablet Commonly known as: Ultram Take 1 tablet (50 mg total) by mouth every 6 (six) hours as needed.       Allergies:  Allergies  Allergen Reactions  . Macrobid [Nitrofurantoin Macrocrystal] Nausea Only    Past Medical History, Surgical history, Social history, and Family History were reviewed and updated.  Review of Systems: All other 10 point review of systems is negative.   Physical Exam:  height is 5\' 1"  (1.549 m) and weight is 127 lb 12.8 oz (58 kg). Her oral temperature is 97.8 F (36.6 C). Her blood pressure is 117/54 (abnormal) and her pulse is 88. Her respiration is 18 and oxygen saturation is 99%.   Wt Readings from Last 3 Encounters:  04/17/20 127 lb 12.8 oz (58 kg)  01/30/20 134 lb (60.8 kg)  01/19/20 134 lb 6.4 oz (61 kg)    Ocular: Sclerae unicteric, pupils equal, round and reactive to light Ear-nose-throat: Oropharynx clear, dentition fair Lymphatic: No cervical or supraclavicular adenopathy Lungs no rales or rhonchi, good excursion bilaterally Heart regular rate and rhythm, no murmur appreciated Abd soft, nontender, positive bowel sounds MSK no focal spinal tenderness, no joint edema Neuro: non-focal, well-oriented, appropriate affect Breasts: Deferred  Lab Results  Component Value Date   WBC 8.6 04/17/2020   HGB 10.7 (L) 04/17/2020   HCT 31.8 (L) 04/17/2020   MCV 103.9 (H) 04/17/2020   PLT 212 04/17/2020   Lab Results  Component Value Date   FERRITIN 225 03/06/2020   IRON 50 03/06/2020   TIBC 200 (L) 03/06/2020   UIBC 150 03/06/2020   IRONPCTSAT 25 03/06/2020   Lab Results  Component Value Date   RETICCTPCT 1.3 04/17/2020   RBC 3.06 (L) 04/17/2020   RBC 3.08 (L) 04/17/2020   Lab Results  Component Value Date   KPAFRELGTCHN 75.8 (H)  03/06/2020   LAMBDASER 44.2 (H) 03/06/2020   KAPLAMBRATIO 1.71 (H) 03/06/2020   Lab Results  Component Value Date   IGGSERUM 2,208 (H) 03/06/2020   IGA 389 03/06/2020   IGMSERUM 100 03/06/2020   Lab Results  Component Value Date   TOTALPROTELP 7.5 03/06/2020   ALBUMINELP 3.3 03/06/2020   A1GS 0.3 03/06/2020   A2GS 0.9 03/06/2020   BETS 1.0 03/06/2020   GAMS 2.0 (H) 03/06/2020   MSPIKE 1.0 (H) 03/06/2020   SPEI Comment 03/06/2020     Chemistry      Component Value Date/Time   NA 137 04/17/2020 1400   K 3.5 04/17/2020 1400   CL 103 04/17/2020 1400   CO2 28 04/17/2020 1400   BUN 15 04/17/2020 1400   CREATININE 1.10 (H) 04/17/2020 1400   CREATININE 1.19 (H) 05/07/2012 1553      Component Value Date/Time   CALCIUM 9.3 04/17/2020 1400   ALKPHOS 76 04/17/2020 1400   AST 17 04/17/2020 1400   ALT 7 04/17/2020 1400   BILITOT 0.5 04/17/2020 1400       Impression and Plan: Mallory Murphy is a very pleasant 75 yo caucasian female with multifactorial anemiaas well as IgG kappa MGUS. She received ESA injection for Hgb 10.7.  Iron studies are pending. We will replace if needed.  Lab and injection in 3 weeks, follow-up in 6 weeks.  She can contact our office with any questions or concerns.   Laverna Peace, NP 1/4/20222:57 PM

## 2020-04-17 NOTE — Patient Instructions (Signed)
Darbepoetin Alfa injection What is this medicine? DARBEPOETIN ALFA (dar be POE e tin AL fa) helps your body make more red blood cells. It is used to treat anemia caused by chronic kidney failure and chemotherapy. This medicine may be used for other purposes; ask your health care provider or pharmacist if you have questions. COMMON BRAND NAME(S): Aranesp What should I tell my health care provider before I take this medicine? They need to know if you have any of these conditions:  blood clotting disorders or history of blood clots  cancer patient not on chemotherapy  cystic fibrosis  heart disease, such as angina, heart failure, or a history of a heart attack  hemoglobin level of 12 g/dL or greater  high blood pressure  low levels of folate, iron, or vitamin B12  seizures  an unusual or allergic reaction to darbepoetin, erythropoietin, albumin, hamster proteins, latex, other medicines, foods, dyes, or preservatives  pregnant or trying to get pregnant  breast-feeding How should I use this medicine? This medicine is for injection into a vein or under the skin. It is usually given by a health care professional in a hospital or clinic setting. If you get this medicine at home, you will be taught how to prepare and give this medicine. Use exactly as directed. Take your medicine at regular intervals. Do not take your medicine more often than directed. It is important that you put your used needles and syringes in a special sharps container. Do not put them in a trash can. If you do not have a sharps container, call your pharmacist or healthcare provider to get one. A special MedGuide will be given to you by the pharmacist with each prescription and refill. Be sure to read this information carefully each time. Talk to your pediatrician regarding the use of this medicine in children. While this medicine may be used in children as young as 1 month of age for selected conditions, precautions do  apply. Overdosage: If you think you have taken too much of this medicine contact a poison control center or emergency room at once. NOTE: This medicine is only for you. Do not share this medicine with others. What if I miss a dose? If you miss a dose, take it as soon as you can. If it is almost time for your next dose, take only that dose. Do not take double or extra doses. What may interact with this medicine? Do not take this medicine with any of the following medications:  epoetin alfa This list may not describe all possible interactions. Give your health care provider a list of all the medicines, herbs, non-prescription drugs, or dietary supplements you use. Also tell them if you smoke, drink alcohol, or use illegal drugs. Some items may interact with your medicine. What should I watch for while using this medicine? Your condition will be monitored carefully while you are receiving this medicine. You may need blood work done while you are taking this medicine. This medicine may cause a decrease in vitamin B6. You should make sure that you get enough vitamin B6 while you are taking this medicine. Discuss the foods you eat and the vitamins you take with your health care professional. What side effects may I notice from receiving this medicine? Side effects that you should report to your doctor or health care professional as soon as possible:  allergic reactions like skin rash, itching or hives, swelling of the face, lips, or tongue  breathing problems  changes in   vision  chest pain  confusion, trouble speaking or understanding  feeling faint or lightheaded, falls  high blood pressure  muscle aches or pains  pain, swelling, warmth in the leg  rapid weight gain  severe headaches  sudden numbness or weakness of the face, arm or leg  trouble walking, dizziness, loss of balance or coordination  seizures (convulsions)  swelling of the ankles, feet, hands  unusually weak or  tired Side effects that usually do not require medical attention (report to your doctor or health care professional if they continue or are bothersome):  diarrhea  fever, chills (flu-like symptoms)  headaches  nausea, vomiting  redness, stinging, or swelling at site where injected This list may not describe all possible side effects. Call your doctor for medical advice about side effects. You may report side effects to FDA at 1-800-FDA-1088. Where should I keep my medicine? Keep out of the reach of children. Store in a refrigerator between 2 and 8 degrees C (36 and 46 degrees F). Do not freeze. Do not shake. Throw away any unused portion if using a single-dose vial. Throw away any unused medicine after the expiration date. NOTE: This sheet is a summary. It may not cover all possible information. If you have questions about this medicine, talk to your doctor, pharmacist, or health care provider.  2020 Elsevier/Gold Standard (2017-04-15 16:44:20)  

## 2020-04-17 NOTE — Telephone Encounter (Signed)
Per stacey move appts up to 1:45, pt ok to move, aom

## 2020-04-17 NOTE — Telephone Encounter (Signed)
appts made and printed for pt per 04/17/20 los    aom

## 2020-04-18 LAB — IRON AND TIBC
Iron: 33 ug/dL — ABNORMAL LOW (ref 41–142)
Saturation Ratios: 19 % — ABNORMAL LOW (ref 21–57)
TIBC: 174 ug/dL — ABNORMAL LOW (ref 236–444)
UIBC: 141 ug/dL (ref 120–384)

## 2020-04-18 LAB — FERRITIN: Ferritin: 161 ng/mL (ref 11–307)

## 2020-04-19 ENCOUNTER — Telehealth: Payer: Self-pay | Admitting: Family

## 2020-04-19 ENCOUNTER — Other Ambulatory Visit: Payer: Self-pay | Admitting: Family

## 2020-04-19 NOTE — Telephone Encounter (Signed)
Called and spoke with patient regarding appointment added per 1/6 sch msg

## 2020-04-24 ENCOUNTER — Other Ambulatory Visit: Payer: Self-pay

## 2020-04-24 ENCOUNTER — Inpatient Hospital Stay: Payer: Medicare Other

## 2020-04-24 VITALS — BP 201/73 | HR 76 | Temp 98.1°F | Resp 18

## 2020-04-24 DIAGNOSIS — D500 Iron deficiency anemia secondary to blood loss (chronic): Secondary | ICD-10-CM

## 2020-04-24 DIAGNOSIS — N189 Chronic kidney disease, unspecified: Secondary | ICD-10-CM | POA: Diagnosis not present

## 2020-04-24 MED ORDER — SODIUM CHLORIDE 0.9 % IV SOLN
510.0000 mg | Freq: Once | INTRAVENOUS | Status: AC
  Administered 2020-04-24: 510 mg via INTRAVENOUS
  Filled 2020-04-24: qty 17

## 2020-04-24 MED ORDER — METHYLPREDNISOLONE SODIUM SUCC 125 MG IJ SOLR
INTRAMUSCULAR | Status: AC
  Filled 2020-04-24: qty 2

## 2020-04-24 MED ORDER — METHYLPREDNISOLONE SODIUM SUCC 125 MG IJ SOLR
125.0000 mg | Freq: Once | INTRAMUSCULAR | Status: AC
  Administered 2020-04-24: 125 mg via INTRAVENOUS

## 2020-04-24 MED ORDER — FAMOTIDINE IN NACL 20-0.9 MG/50ML-% IV SOLN
20.0000 mg | Freq: Once | INTRAVENOUS | Status: AC
  Administered 2020-04-24: 20 mg via INTRAVENOUS

## 2020-04-24 MED ORDER — SODIUM CHLORIDE 0.9 % IV SOLN
INTRAVENOUS | Status: DC
  Filled 2020-04-24: qty 250

## 2020-04-24 MED ORDER — FAMOTIDINE IN NACL 20-0.9 MG/50ML-% IV SOLN
INTRAVENOUS | Status: AC
  Filled 2020-04-24: qty 50

## 2020-04-24 NOTE — Patient Instructions (Signed)
Ferumoxytol injection What is this medicine? FERUMOXYTOL is an iron complex. Iron is used to make healthy red blood cells, which carry oxygen and nutrients throughout the body. This medicine is used to treat iron deficiency anemia. This medicine may be used for other purposes; ask your health care provider or pharmacist if you have questions. COMMON BRAND NAME(S): Feraheme What should I tell my health care provider before I take this medicine? They need to know if you have any of these conditions:  anemia not caused by low iron levels  high levels of iron in the blood  magnetic resonance imaging (MRI) test scheduled  an unusual or allergic reaction to iron, other medicines, foods, dyes, or preservatives  pregnant or trying to get pregnant  breast-feeding How should I use this medicine? This medicine is for injection into a vein. It is given by a health care professional in a hospital or clinic setting. Talk to your pediatrician regarding the use of this medicine in children. Special care may be needed. Overdosage: If you think you have taken too much of this medicine contact a poison control center or emergency room at once. NOTE: This medicine is only for you. Do not share this medicine with others. What if I miss a dose? It is important not to miss your dose. Call your doctor or health care professional if you are unable to keep an appointment. What may interact with this medicine? This medicine may interact with the following medications:  other iron products This list may not describe all possible interactions. Give your health care provider a list of all the medicines, herbs, non-prescription drugs, or dietary supplements you use. Also tell them if you smoke, drink alcohol, or use illegal drugs. Some items may interact with your medicine. What should I watch for while using this medicine? Visit your doctor or healthcare professional regularly. Tell your doctor or healthcare  professional if your symptoms do not start to get better or if they get worse. You may need blood work done while you are taking this medicine. You may need to follow a special diet. Talk to your doctor. Foods that contain iron include: whole grains/cereals, dried fruits, beans, or peas, leafy green vegetables, and organ meats (liver, kidney). What side effects may I notice from receiving this medicine? Side effects that you should report to your doctor or health care professional as soon as possible:  allergic reactions like skin rash, itching or hives, swelling of the face, lips, or tongue  breathing problems  changes in blood pressure  feeling faint or lightheaded, falls  fever or chills  flushing, sweating, or hot feelings  swelling of the ankles or feet Side effects that usually do not require medical attention (report to your doctor or health care professional if they continue or are bothersome):  diarrhea  headache  nausea, vomiting  stomach pain This list may not describe all possible side effects. Call your doctor for medical advice about side effects. You may report side effects to FDA at 1-800-FDA-1088. Where should I keep my medicine? This drug is given in a hospital or clinic and will not be stored at home. NOTE: This sheet is a summary. It may not cover all possible information. If you have questions about this medicine, talk to your doctor, pharmacist, or health care provider.  2021 Elsevier/Gold Standard (2016-05-19 20:21:10)  

## 2020-04-27 ENCOUNTER — Ambulatory Visit: Payer: Medicare Other

## 2020-05-08 ENCOUNTER — Inpatient Hospital Stay: Payer: Medicare Other

## 2020-05-08 ENCOUNTER — Other Ambulatory Visit: Payer: Self-pay

## 2020-05-08 DIAGNOSIS — N189 Chronic kidney disease, unspecified: Secondary | ICD-10-CM | POA: Diagnosis not present

## 2020-05-08 DIAGNOSIS — D631 Anemia in chronic kidney disease: Secondary | ICD-10-CM

## 2020-05-08 DIAGNOSIS — D500 Iron deficiency anemia secondary to blood loss (chronic): Secondary | ICD-10-CM

## 2020-05-08 LAB — CBC WITH DIFFERENTIAL (CANCER CENTER ONLY)
Abs Immature Granulocytes: 0.03 10*3/uL (ref 0.00–0.07)
Basophils Absolute: 0 10*3/uL (ref 0.0–0.1)
Basophils Relative: 0 %
Eosinophils Absolute: 0.1 10*3/uL (ref 0.0–0.5)
Eosinophils Relative: 1 %
HCT: 33.5 % — ABNORMAL LOW (ref 36.0–46.0)
Hemoglobin: 11.5 g/dL — ABNORMAL LOW (ref 12.0–15.0)
Immature Granulocytes: 0 %
Lymphocytes Relative: 35 %
Lymphs Abs: 3.3 10*3/uL (ref 0.7–4.0)
MCH: 36.4 pg — ABNORMAL HIGH (ref 26.0–34.0)
MCHC: 34.3 g/dL (ref 30.0–36.0)
MCV: 106 fL — ABNORMAL HIGH (ref 80.0–100.0)
Monocytes Absolute: 0.9 10*3/uL (ref 0.1–1.0)
Monocytes Relative: 10 %
Neutro Abs: 5.2 10*3/uL (ref 1.7–7.7)
Neutrophils Relative %: 54 %
Platelet Count: 306 10*3/uL (ref 150–400)
RBC: 3.16 MIL/uL — ABNORMAL LOW (ref 3.87–5.11)
RDW: 19.2 % — ABNORMAL HIGH (ref 11.5–15.5)
WBC Count: 9.6 10*3/uL (ref 4.0–10.5)
nRBC: 0 % (ref 0.0–0.2)

## 2020-05-08 LAB — CMP (CANCER CENTER ONLY)
ALT: 6 U/L (ref 0–44)
AST: 16 U/L (ref 15–41)
Albumin: 3.7 g/dL (ref 3.5–5.0)
Alkaline Phosphatase: 78 U/L (ref 38–126)
Anion gap: 10 (ref 5–15)
BUN: 16 mg/dL (ref 8–23)
CO2: 27 mmol/L (ref 22–32)
Calcium: 9.5 mg/dL (ref 8.9–10.3)
Chloride: 101 mmol/L (ref 98–111)
Creatinine: 0.91 mg/dL (ref 0.44–1.00)
GFR, Estimated: >60 mL/min (ref >60)
Glucose, Bld: 90 mg/dL (ref 70–99)
Potassium: 3.6 mmol/L (ref 3.5–5.1)
Sodium: 138 mmol/L (ref 135–145)
Total Bilirubin: 0.5 mg/dL (ref 0.3–1.2)
Total Protein: 7.5 g/dL (ref 6.5–8.1)

## 2020-05-29 ENCOUNTER — Telehealth: Payer: Self-pay | Admitting: *Deleted

## 2020-05-29 ENCOUNTER — Inpatient Hospital Stay (HOSPITAL_BASED_OUTPATIENT_CLINIC_OR_DEPARTMENT_OTHER): Payer: Medicare Other | Admitting: Family

## 2020-05-29 ENCOUNTER — Inpatient Hospital Stay: Payer: Medicare Other | Attending: Hematology & Oncology

## 2020-05-29 ENCOUNTER — Other Ambulatory Visit: Payer: Self-pay

## 2020-05-29 ENCOUNTER — Inpatient Hospital Stay: Payer: Medicare Other

## 2020-05-29 ENCOUNTER — Encounter: Payer: Self-pay | Admitting: Family

## 2020-05-29 VITALS — BP 131/76 | HR 95 | Temp 98.7°F | Resp 18 | Wt 128.0 lb

## 2020-05-29 DIAGNOSIS — D500 Iron deficiency anemia secondary to blood loss (chronic): Secondary | ICD-10-CM | POA: Diagnosis not present

## 2020-05-29 DIAGNOSIS — N189 Chronic kidney disease, unspecified: Secondary | ICD-10-CM | POA: Insufficient documentation

## 2020-05-29 DIAGNOSIS — D631 Anemia in chronic kidney disease: Secondary | ICD-10-CM

## 2020-05-29 LAB — CMP (CANCER CENTER ONLY)
ALT: 6 U/L (ref 0–44)
AST: 14 U/L — ABNORMAL LOW (ref 15–41)
Albumin: 3.6 g/dL (ref 3.5–5.0)
Alkaline Phosphatase: 76 U/L (ref 38–126)
Anion gap: 7 (ref 5–15)
BUN: 17 mg/dL (ref 8–23)
CO2: 27 mmol/L (ref 22–32)
Calcium: 9.3 mg/dL (ref 8.9–10.3)
Chloride: 102 mmol/L (ref 98–111)
Creatinine: 0.91 mg/dL (ref 0.44–1.00)
GFR, Estimated: >60 mL/min (ref >60)
Glucose, Bld: 122 mg/dL — ABNORMAL HIGH (ref 70–99)
Potassium: 3.8 mmol/L (ref 3.5–5.1)
Sodium: 136 mmol/L (ref 135–145)
Total Bilirubin: 0.4 mg/dL (ref 0.3–1.2)
Total Protein: 7.3 g/dL (ref 6.5–8.1)

## 2020-05-29 LAB — CBC WITH DIFFERENTIAL (CANCER CENTER ONLY)
Abs Immature Granulocytes: 0.02 10*3/uL (ref 0.00–0.07)
Basophils Absolute: 0 10*3/uL (ref 0.0–0.1)
Basophils Relative: 0 %
Eosinophils Absolute: 0.3 10*3/uL (ref 0.0–0.5)
Eosinophils Relative: 3 %
HCT: 31.8 % — ABNORMAL LOW (ref 36.0–46.0)
Hemoglobin: 10.9 g/dL — ABNORMAL LOW (ref 12.0–15.0)
Immature Granulocytes: 0 %
Lymphocytes Relative: 31 %
Lymphs Abs: 2.6 10*3/uL (ref 0.7–4.0)
MCH: 36.1 pg — ABNORMAL HIGH (ref 26.0–34.0)
MCHC: 34.3 g/dL (ref 30.0–36.0)
MCV: 105.3 fL — ABNORMAL HIGH (ref 80.0–100.0)
Monocytes Absolute: 0.7 10*3/uL (ref 0.1–1.0)
Monocytes Relative: 8 %
Neutro Abs: 4.7 10*3/uL (ref 1.7–7.7)
Neutrophils Relative %: 58 %
Platelet Count: 339 10*3/uL (ref 150–400)
RBC: 3.02 MIL/uL — ABNORMAL LOW (ref 3.87–5.11)
RDW: 17.5 % — ABNORMAL HIGH (ref 11.5–15.5)
WBC Count: 8.4 10*3/uL (ref 4.0–10.5)
nRBC: 0 % (ref 0.0–0.2)

## 2020-05-29 MED ORDER — DARBEPOETIN ALFA 300 MCG/0.6ML IJ SOSY
300.0000 ug | PREFILLED_SYRINGE | Freq: Once | INTRAMUSCULAR | Status: AC
  Administered 2020-05-29: 300 ug via SUBCUTANEOUS

## 2020-05-29 MED ORDER — DARBEPOETIN ALFA 300 MCG/0.6ML IJ SOSY
PREFILLED_SYRINGE | INTRAMUSCULAR | Status: AC
  Filled 2020-05-29: qty 0.6

## 2020-05-29 NOTE — Telephone Encounter (Signed)
Per los 05/29/20 called patient lvm of upcoming appts. - mailed calendar

## 2020-05-29 NOTE — Patient Instructions (Signed)
Darbepoetin Alfa injection What is this medicine? DARBEPOETIN ALFA (dar be POE e tin AL fa) helps your body make more red blood cells. It is used to treat anemia caused by chronic kidney failure and chemotherapy. This medicine may be used for other purposes; ask your health care provider or pharmacist if you have questions. COMMON BRAND NAME(S): Aranesp What should I tell my health care provider before I take this medicine? They need to know if you have any of these conditions:  blood clotting disorders or history of blood clots  cancer patient not on chemotherapy  cystic fibrosis  heart disease, such as angina, heart failure, or a history of a heart attack  hemoglobin level of 12 g/dL or greater  high blood pressure  low levels of folate, iron, or vitamin B12  seizures  an unusual or allergic reaction to darbepoetin, erythropoietin, albumin, hamster proteins, latex, other medicines, foods, dyes, or preservatives  pregnant or trying to get pregnant  breast-feeding How should I use this medicine? This medicine is for injection into a vein or under the skin. It is usually given by a health care professional in a hospital or clinic setting. If you get this medicine at home, you will be taught how to prepare and give this medicine. Use exactly as directed. Take your medicine at regular intervals. Do not take your medicine more often than directed. It is important that you put your used needles and syringes in a special sharps container. Do not put them in a trash can. If you do not have a sharps container, call your pharmacist or healthcare provider to get one. A special MedGuide will be given to you by the pharmacist with each prescription and refill. Be sure to read this information carefully each time. Talk to your pediatrician regarding the use of this medicine in children. While this medicine may be used in children as young as 1 month of age for selected conditions, precautions do  apply. Overdosage: If you think you have taken too much of this medicine contact a poison control center or emergency room at once. NOTE: This medicine is only for you. Do not share this medicine with others. What if I miss a dose? If you miss a dose, take it as soon as you can. If it is almost time for your next dose, take only that dose. Do not take double or extra doses. What may interact with this medicine? Do not take this medicine with any of the following medications:  epoetin alfa This list may not describe all possible interactions. Give your health care provider a list of all the medicines, herbs, non-prescription drugs, or dietary supplements you use. Also tell them if you smoke, drink alcohol, or use illegal drugs. Some items may interact with your medicine. What should I watch for while using this medicine? Your condition will be monitored carefully while you are receiving this medicine. You may need blood work done while you are taking this medicine. This medicine may cause a decrease in vitamin B6. You should make sure that you get enough vitamin B6 while you are taking this medicine. Discuss the foods you eat and the vitamins you take with your health care professional. What side effects may I notice from receiving this medicine? Side effects that you should report to your doctor or health care professional as soon as possible:  allergic reactions like skin rash, itching or hives, swelling of the face, lips, or tongue  breathing problems  changes in   vision  chest pain  confusion, trouble speaking or understanding  feeling faint or lightheaded, falls  high blood pressure  muscle aches or pains  pain, swelling, warmth in the leg  rapid weight gain  severe headaches  sudden numbness or weakness of the face, arm or leg  trouble walking, dizziness, loss of balance or coordination  seizures (convulsions)  swelling of the ankles, feet, hands  unusually weak or  tired Side effects that usually do not require medical attention (report to your doctor or health care professional if they continue or are bothersome):  diarrhea  fever, chills (flu-like symptoms)  headaches  nausea, vomiting  redness, stinging, or swelling at site where injected This list may not describe all possible side effects. Call your doctor for medical advice about side effects. You may report side effects to FDA at 1-800-FDA-1088. Where should I keep my medicine? Keep out of the reach of children. Store in a refrigerator between 2 and 8 degrees C (36 and 46 degrees F). Do not freeze. Do not shake. Throw away any unused portion if using a single-dose vial. Throw away any unused medicine after the expiration date. NOTE: This sheet is a summary. It may not cover all possible information. If you have questions about this medicine, talk to your doctor, pharmacist, or health care provider.  2021 Elsevier/Gold Standard (2017-04-15 16:44:20)  

## 2020-05-29 NOTE — Progress Notes (Signed)
Hematology and Oncology Follow Up Visit  Mallory Murphy [id number] 1945-12-05 75 y.o. 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 She is doing quite well and getting around nicely without assistance.  No blood loss noted. No petechiae.  She notes some fatigue at times and will take a break to rest if needed.  No fever, chills, n/v, cough, rash, dizziness, SOB, chest pain, palpitations, abdominal pain or changes in bowel or bladder habits.  No swelling, numbness or tingling in her extremities at this time.  No falls or syncope to report.  She is eating well and staying properly hydrated. Her weight is stable at 128 lbs.   ECOG Performance Status: 1 - Symptomatic but completely ambulatory  Medications:  Allergies as of 05/29/2020      Reactions   Macrobid [nitrofurantoin Macrocrystal] Nausea Only      Medication List       Accurate as of May 29, 2020  1:51 PM. If you have any questions, ask your nurse or doctor.        acetaminophen 500 MG tablet Commonly known as: TYLENOL Take 500-1,000 mg by mouth every 6 (six) hours as needed for mild pain, fever or headache.   escitalopram 10 MG tablet Commonly known as: LEXAPRO Take 10 mg by mouth daily.   losartan 100 MG tablet Commonly known as: COZAAR Take 100 mg by mouth daily.   oxybutynin 5 MG tablet Commonly known as: DITROPAN Take 5 mg by mouth daily.   [place] ARIA IV Inject into the vein every 8 (eight) weeks. For RA [facility] Rhematology.   traMADol 50 MG tablet Commonly known as: Ultram Take 1 tablet (50 mg total) by mouth every 6 (six) hours as needed.       Allergies:  Allergies  Allergen Reactions  . Macrobid [Nitrofurantoin Macrocrystal] Nausea Only    Past  Medical History, Surgical history, Social history, and Family History were reviewed and updated.  Review of Systems: All other 10 point review of systems is negative.   Physical Exam:  weight is 128 lb (58.1 kg). Her oral temperature is 98.7 F (37.1 C). Her blood pressure is 131/76 and her pulse is 95. Her respiration is 18 and oxygen saturation is 98%.   Wt Readings from Last 3 Encounters:  05/29/20 128 lb (58.1 kg)  04/17/20 127 lb 12.8 oz (58 kg)  01/30/20 134 lb (60.8 kg)    Ocular: Sclerae unicteric, pupils equal, round and reactive to light Ear-nose-throat: Oropharynx clear, dentition fair Lymphatic: No cervical or supraclavicular adenopathy Lungs no rales or rhonchi, good excursion bilaterally Heart regular rate and rhythm, no murmur appreciated Abd soft, nontender, positive bowel sounds MSK no focal spinal tenderness, no joint edema Neuro: non-focal, well-oriented, appropriate affect Breasts: Deferred   Lab Results  Component Value Date   WBC 8.4 05/29/2020   HGB 10.9 (L) 05/29/2020   HCT 31.8 (L) 05/29/2020   MCV 105.3 (H) 05/29/2020   PLT 339 05/29/2020   Lab Results  Component Value Date   FERRITIN 161 04/17/2020   IRON 33 (L) 04/17/2020   TIBC 174 (L) 04/17/2020   UIBC 141 04/17/2020   IRONPCTSAT 19 (L) 04/17/2020   Lab Results  Component Value Date   RETICCTPCT 1.3 04/17/2020   RBC 3.02 (L)  05/29/2020   Lab Results  Component Value Date   KPAFRELGTCHN 75.8 (H) 03/06/2020   LAMBDASER 44.2 (H) 03/06/2020   KAPLAMBRATIO 1.71 (H) 03/06/2020   Lab Results  Component Value Date   IGGSERUM 2,208 (H) 03/06/2020   IGA 389 03/06/2020   IGMSERUM 100 03/06/2020   Lab Results  Component Value Date   TOTALPROTELP 7.5 03/06/2020   ALBUMINELP 3.3 03/06/2020   A1GS 0.3 03/06/2020   A2GS 0.9 03/06/2020   BETS 1.0 03/06/2020   GAMS 2.0 (H) 03/06/2020   MSPIKE 1.0 (H) 03/06/2020   SPEI Comment 03/06/2020     Chemistry      Component Value Date/Time    NA 138 05/08/2020 1335   K 3.6 05/08/2020 1335   CL 101 05/08/2020 1335   CO2 27 05/08/2020 1335   BUN 16 05/08/2020 1335   CREATININE 0.91 05/08/2020 1335   CREATININE 1.19 (H) 05/07/2012 1553      Component Value Date/Time   CALCIUM 9.5 05/08/2020 1335   ALKPHOS 78 05/08/2020 1335   AST 16 05/08/2020 1335   ALT 6 05/08/2020 1335   BILITOT 0.5 05/08/2020 1335       Impression and Plan: Mallory Murphy is a very pleasant 75 yo caucasian female with multifactorial anemiaas well as IgG kappa MGUS. ESA given for Hgb 10.9.  Iron studies are pending. We can replace if needed.  Lab in 3 weeks, follow-up in 6 weeks.  She can contact our office with any questions or concerns.   Laverna Peace, NP 2/15/20221:51 PM

## 2020-05-30 LAB — IRON AND TIBC
Iron: 32 ug/dL — ABNORMAL LOW (ref 41–142)
Saturation Ratios: 18 % — ABNORMAL LOW (ref 21–57)
TIBC: 180 ug/dL — ABNORMAL LOW (ref 236–444)
UIBC: 148 ug/dL (ref 120–384)

## 2020-05-30 LAB — FERRITIN: Ferritin: 360 ng/mL — ABNORMAL HIGH (ref 11–307)

## 2020-06-08 ENCOUNTER — Inpatient Hospital Stay: Payer: Medicare Other

## 2020-06-12 ENCOUNTER — Inpatient Hospital Stay: Payer: Medicare Other | Attending: Hematology & Oncology

## 2020-06-12 ENCOUNTER — Other Ambulatory Visit: Payer: Self-pay

## 2020-06-12 VITALS — BP 148/71 | HR 81 | Temp 98.4°F | Resp 18

## 2020-06-12 DIAGNOSIS — D472 Monoclonal gammopathy: Secondary | ICD-10-CM | POA: Diagnosis not present

## 2020-06-12 DIAGNOSIS — N189 Chronic kidney disease, unspecified: Secondary | ICD-10-CM | POA: Insufficient documentation

## 2020-06-12 DIAGNOSIS — D509 Iron deficiency anemia, unspecified: Secondary | ICD-10-CM | POA: Diagnosis present

## 2020-06-12 DIAGNOSIS — E611 Iron deficiency: Secondary | ICD-10-CM | POA: Diagnosis not present

## 2020-06-12 DIAGNOSIS — D631 Anemia in chronic kidney disease: Secondary | ICD-10-CM | POA: Diagnosis present

## 2020-06-12 DIAGNOSIS — D500 Iron deficiency anemia secondary to blood loss (chronic): Secondary | ICD-10-CM

## 2020-06-12 MED ORDER — SODIUM CHLORIDE 0.9 % IV SOLN
510.0000 mg | Freq: Once | INTRAVENOUS | Status: AC
  Administered 2020-06-12: 510 mg via INTRAVENOUS
  Filled 2020-06-12: qty 17

## 2020-06-12 MED ORDER — SODIUM CHLORIDE 0.9 % IV SOLN
INTRAVENOUS | Status: DC
  Filled 2020-06-12: qty 250

## 2020-06-12 MED ORDER — FAMOTIDINE IN NACL 20-0.9 MG/50ML-% IV SOLN: 20.0000 mg | Freq: Once | INTRAVENOUS | Status: DC

## 2020-06-12 MED ORDER — METHYLPREDNISOLONE SODIUM SUCC 125 MG IJ SOLR: 125.0000 mg | Freq: Once | INTRAMUSCULAR | Status: DC

## 2020-06-12 NOTE — Patient Instructions (Signed)
Ferumoxytol injection What is this medicine? FERUMOXYTOL is an iron complex. Iron is used to make healthy red blood cells, which carry oxygen and nutrients throughout the body. This medicine is used to treat iron deficiency anemia. This medicine may be used for other purposes; ask your health care provider or pharmacist if you have questions. COMMON BRAND NAME(S): Feraheme What should I tell my health care provider before I take this medicine? They need to know if you have any of these conditions:  anemia not caused by low iron levels  high levels of iron in the blood  magnetic resonance imaging (MRI) test scheduled  an unusual or allergic reaction to iron, other medicines, foods, dyes, or preservatives  pregnant or trying to get pregnant  breast-feeding How should I use this medicine? This medicine is for injection into a vein. It is given by a health care professional in a hospital or clinic setting. Talk to your pediatrician regarding the use of this medicine in children. Special care may be needed. Overdosage: If you think you have taken too much of this medicine contact a poison control center or emergency room at once. NOTE: This medicine is only for you. Do not share this medicine with others. What if I miss a dose? It is important not to miss your dose. Call your doctor or health care professional if you are unable to keep an appointment. What may interact with this medicine? This medicine may interact with the following medications:  other iron products This list may not describe all possible interactions. Give your health care provider a list of all the medicines, herbs, non-prescription drugs, or dietary supplements you use. Also tell them if you smoke, drink alcohol, or use illegal drugs. Some items may interact with your medicine. What should I watch for while using this medicine? Visit your doctor or healthcare professional regularly. Tell your doctor or healthcare  professional if your symptoms do not start to get better or if they get worse. You may need blood work done while you are taking this medicine. You may need to follow a special diet. Talk to your doctor. Foods that contain iron include: whole grains/cereals, dried fruits, beans, or peas, leafy green vegetables, and organ meats (liver, kidney). What side effects may I notice from receiving this medicine? Side effects that you should report to your doctor or health care professional as soon as possible:  allergic reactions like skin rash, itching or hives, swelling of the face, lips, or tongue  breathing problems  changes in blood pressure  feeling faint or lightheaded, falls  fever or chills  flushing, sweating, or hot feelings  swelling of the ankles or feet Side effects that usually do not require medical attention (report to your doctor or health care professional if they continue or are bothersome):  diarrhea  headache  nausea, vomiting  stomach pain This list may not describe all possible side effects. Call your doctor for medical advice about side effects. You may report side effects to FDA at 1-800-FDA-1088. Where should I keep my medicine? This drug is given in a hospital or clinic and will not be stored at home. NOTE: This sheet is a summary. It may not cover all possible information. If you have questions about this medicine, talk to your doctor, pharmacist, or health care provider.  2021 Elsevier/Gold Standard (2016-05-19 20:21:10)  

## 2020-06-19 ENCOUNTER — Inpatient Hospital Stay: Payer: Medicare Other

## 2020-06-19 ENCOUNTER — Other Ambulatory Visit: Payer: Self-pay

## 2020-06-19 DIAGNOSIS — D631 Anemia in chronic kidney disease: Secondary | ICD-10-CM

## 2020-06-19 DIAGNOSIS — D500 Iron deficiency anemia secondary to blood loss (chronic): Secondary | ICD-10-CM

## 2020-06-19 DIAGNOSIS — N189 Chronic kidney disease, unspecified: Secondary | ICD-10-CM | POA: Diagnosis not present

## 2020-06-19 LAB — CMP (CANCER CENTER ONLY)
ALT: 6 U/L (ref 0–44)
AST: 14 U/L — ABNORMAL LOW (ref 15–41)
Albumin: 3.7 g/dL (ref 3.5–5.0)
Alkaline Phosphatase: 94 U/L (ref 38–126)
Anion gap: 6 (ref 5–15)
BUN: 21 mg/dL (ref 8–23)
CO2: 28 mmol/L (ref 22–32)
Calcium: 9.2 mg/dL (ref 8.9–10.3)
Chloride: 103 mmol/L (ref 98–111)
Creatinine: 0.87 mg/dL (ref 0.44–1.00)
GFR, Estimated: >60 mL/min (ref >60)
Glucose, Bld: 107 mg/dL — ABNORMAL HIGH (ref 70–99)
Potassium: 3.3 mmol/L — ABNORMAL LOW (ref 3.5–5.1)
Sodium: 137 mmol/L (ref 135–145)
Total Bilirubin: 0.5 mg/dL (ref 0.3–1.2)
Total Protein: 7.5 g/dL (ref 6.5–8.1)

## 2020-06-19 LAB — CBC WITH DIFFERENTIAL (CANCER CENTER ONLY)
Abs Immature Granulocytes: 0.03 10*3/uL (ref 0.00–0.07)
Basophils Absolute: 0 10*3/uL (ref 0.0–0.1)
Basophils Relative: 0 %
Eosinophils Absolute: 0.3 10*3/uL (ref 0.0–0.5)
Eosinophils Relative: 4 %
HCT: 35 % — ABNORMAL LOW (ref 36.0–46.0)
Hemoglobin: 11.6 g/dL — ABNORMAL LOW (ref 12.0–15.0)
Immature Granulocytes: 0 %
Lymphocytes Relative: 28 %
Lymphs Abs: 2.3 10*3/uL (ref 0.7–4.0)
MCH: 35.9 pg — ABNORMAL HIGH (ref 26.0–34.0)
MCHC: 33.1 g/dL (ref 30.0–36.0)
MCV: 108.4 fL — ABNORMAL HIGH (ref 80.0–100.0)
Monocytes Absolute: 0.7 10*3/uL (ref 0.1–1.0)
Monocytes Relative: 9 %
Neutro Abs: 4.7 10*3/uL (ref 1.7–7.7)
Neutrophils Relative %: 59 %
Platelet Count: 317 10*3/uL (ref 150–400)
RBC: 3.23 MIL/uL — ABNORMAL LOW (ref 3.87–5.11)
RDW: 16.3 % — ABNORMAL HIGH (ref 11.5–15.5)
WBC Count: 8.1 10*3/uL (ref 4.0–10.5)
nRBC: 0 % (ref 0.0–0.2)

## 2020-06-20 LAB — IRON AND TIBC
Iron: 70 ug/dL (ref 41–142)
Saturation Ratios: 43 % (ref 21–57)
TIBC: 163 ug/dL — ABNORMAL LOW (ref 236–444)
UIBC: 93 ug/dL — ABNORMAL LOW (ref 120–384)

## 2020-06-20 LAB — FERRITIN: Ferritin: 791 ng/mL — ABNORMAL HIGH (ref 11–307)

## 2020-06-26 ENCOUNTER — Other Ambulatory Visit: Payer: Medicare Other

## 2020-07-10 ENCOUNTER — Inpatient Hospital Stay (HOSPITAL_BASED_OUTPATIENT_CLINIC_OR_DEPARTMENT_OTHER): Payer: Medicare Other | Admitting: Family

## 2020-07-10 ENCOUNTER — Other Ambulatory Visit: Payer: Self-pay

## 2020-07-10 ENCOUNTER — Inpatient Hospital Stay: Payer: Medicare Other

## 2020-07-10 ENCOUNTER — Encounter: Payer: Self-pay | Admitting: Family

## 2020-07-10 VITALS — BP 151/72 | HR 92 | Temp 98.5°F | Resp 20 | Wt 130.4 lb

## 2020-07-10 DIAGNOSIS — D500 Iron deficiency anemia secondary to blood loss (chronic): Secondary | ICD-10-CM | POA: Diagnosis not present

## 2020-07-10 DIAGNOSIS — N189 Chronic kidney disease, unspecified: Secondary | ICD-10-CM | POA: Diagnosis not present

## 2020-07-10 DIAGNOSIS — D631 Anemia in chronic kidney disease: Secondary | ICD-10-CM

## 2020-07-10 DIAGNOSIS — D472 Monoclonal gammopathy: Secondary | ICD-10-CM | POA: Diagnosis not present

## 2020-07-10 LAB — CBC WITH DIFFERENTIAL (CANCER CENTER ONLY)
Abs Immature Granulocytes: 0.03 10*3/uL (ref 0.00–0.07)
Basophils Absolute: 0 10*3/uL (ref 0.0–0.1)
Basophils Relative: 0 %
Eosinophils Absolute: 0.3 10*3/uL (ref 0.0–0.5)
Eosinophils Relative: 3 %
HCT: 31.3 % — ABNORMAL LOW (ref 36.0–46.0)
Hemoglobin: 10.8 g/dL — ABNORMAL LOW (ref 12.0–15.0)
Immature Granulocytes: 0 %
Lymphocytes Relative: 25 %
Lymphs Abs: 2.2 10*3/uL (ref 0.7–4.0)
MCH: 36.6 pg — ABNORMAL HIGH (ref 26.0–34.0)
MCHC: 34.5 g/dL (ref 30.0–36.0)
MCV: 106.1 fL — ABNORMAL HIGH (ref 80.0–100.0)
Monocytes Absolute: 0.9 10*3/uL (ref 0.1–1.0)
Monocytes Relative: 10 %
Neutro Abs: 5.2 10*3/uL (ref 1.7–7.7)
Neutrophils Relative %: 62 %
Platelet Count: 267 10*3/uL (ref 150–400)
RBC: 2.95 MIL/uL — ABNORMAL LOW (ref 3.87–5.11)
RDW: 14.4 % (ref 11.5–15.5)
WBC Count: 8.6 10*3/uL (ref 4.0–10.5)
nRBC: 0 % (ref 0.0–0.2)

## 2020-07-10 LAB — CMP (CANCER CENTER ONLY)
ALT: 8 U/L (ref 0–44)
AST: 18 U/L (ref 15–41)
Albumin: 3.2 g/dL — ABNORMAL LOW (ref 3.5–5.0)
Alkaline Phosphatase: 83 U/L (ref 38–126)
Anion gap: 9 (ref 5–15)
BUN: 12 mg/dL (ref 8–23)
CO2: 25 mmol/L (ref 22–32)
Calcium: 8.4 mg/dL — ABNORMAL LOW (ref 8.9–10.3)
Chloride: 101 mmol/L (ref 98–111)
Creatinine: 0.74 mg/dL (ref 0.44–1.00)
GFR, Estimated: >60 mL/min (ref >60)
Glucose, Bld: 97 mg/dL (ref 70–99)
Potassium: 3.5 mmol/L (ref 3.5–5.1)
Sodium: 135 mmol/L (ref 135–145)
Total Bilirubin: 0.4 mg/dL (ref 0.3–1.2)
Total Protein: 7.8 g/dL (ref 6.5–8.1)

## 2020-07-10 MED ORDER — DARBEPOETIN ALFA 300 MCG/0.6ML IJ SOSY
300.0000 ug | PREFILLED_SYRINGE | Freq: Once | INTRAMUSCULAR | Status: AC
  Administered 2020-07-10: 300 ug via SUBCUTANEOUS

## 2020-07-10 MED ORDER — DARBEPOETIN ALFA 300 MCG/0.6ML IJ SOSY
PREFILLED_SYRINGE | INTRAMUSCULAR | Status: AC
  Filled 2020-07-10: qty 0.6

## 2020-07-10 NOTE — Patient Instructions (Signed)
Darbepoetin Alfa injection What is this medicine? DARBEPOETIN ALFA (dar be POE e tin AL fa) helps your body make more red blood cells. It is used to treat anemia caused by chronic kidney failure and chemotherapy. This medicine may be used for other purposes; ask your health care provider or pharmacist if you have questions. COMMON BRAND NAME(S): Aranesp What should I tell my health care provider before I take this medicine? They need to know if you have any of these conditions:  blood clotting disorders or history of blood clots  cancer patient not on chemotherapy  cystic fibrosis  heart disease, such as angina, heart failure, or a history of a heart attack  hemoglobin level of 12 g/dL or greater  high blood pressure  low levels of folate, iron, or vitamin B12  seizures  an unusual or allergic reaction to darbepoetin, erythropoietin, albumin, hamster proteins, latex, other medicines, foods, dyes, or preservatives  pregnant or trying to get pregnant  breast-feeding How should I use this medicine? This medicine is for injection into a vein or under the skin. It is usually given by a health care professional in a hospital or clinic setting. If you get this medicine at home, you will be taught how to prepare and give this medicine. Use exactly as directed. Take your medicine at regular intervals. Do not take your medicine more often than directed. It is important that you put your used needles and syringes in a special sharps container. Do not put them in a trash can. If you do not have a sharps container, call your pharmacist or healthcare provider to get one. A special MedGuide will be given to you by the pharmacist with each prescription and refill. Be sure to read this information carefully each time. Talk to your pediatrician regarding the use of this medicine in children. While this medicine may be used in children as young as 1 month of age for selected conditions, precautions do  apply. Overdosage: If you think you have taken too much of this medicine contact a poison control center or emergency room at once. NOTE: This medicine is only for you. Do not share this medicine with others. What if I miss a dose? If you miss a dose, take it as soon as you can. If it is almost time for your next dose, take only that dose. Do not take double or extra doses. What may interact with this medicine? Do not take this medicine with any of the following medications:  epoetin alfa This list may not describe all possible interactions. Give your health care provider a list of all the medicines, herbs, non-prescription drugs, or dietary supplements you use. Also tell them if you smoke, drink alcohol, or use illegal drugs. Some items may interact with your medicine. What should I watch for while using this medicine? Your condition will be monitored carefully while you are receiving this medicine. You may need blood work done while you are taking this medicine. This medicine may cause a decrease in vitamin B6. You should make sure that you get enough vitamin B6 while you are taking this medicine. Discuss the foods you eat and the vitamins you take with your health care professional. What side effects may I notice from receiving this medicine? Side effects that you should report to your doctor or health care professional as soon as possible:  allergic reactions like skin rash, itching or hives, swelling of the face, lips, or tongue  breathing problems  changes in   vision  chest pain  confusion, trouble speaking or understanding  feeling faint or lightheaded, falls  high blood pressure  muscle aches or pains  pain, swelling, warmth in the leg  rapid weight gain  severe headaches  sudden numbness or weakness of the face, arm or leg  trouble walking, dizziness, loss of balance or coordination  seizures (convulsions)  swelling of the ankles, feet, hands  unusually weak or  tired Side effects that usually do not require medical attention (report to your doctor or health care professional if they continue or are bothersome):  diarrhea  fever, chills (flu-like symptoms)  headaches  nausea, vomiting  redness, stinging, or swelling at site where injected This list may not describe all possible side effects. Call your doctor for medical advice about side effects. You may report side effects to FDA at 1-800-FDA-1088. Where should I keep my medicine? Keep out of the reach of children. Store in a refrigerator between 2 and 8 degrees C (36 and 46 degrees F). Do not freeze. Do not shake. Throw away any unused portion if using a single-dose vial. Throw away any unused medicine after the expiration date. NOTE: This sheet is a summary. It may not cover all possible information. If you have questions about this medicine, talk to your doctor, pharmacist, or health care provider.  2021 Elsevier/Gold Standard (2017-04-15 16:44:20)  

## 2020-07-10 NOTE — Progress Notes (Signed)
Hematology and Oncology Follow Up Visit  Mallory Murphy [id number] Sep 26, 1945 75 y.o. 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 She is doing well but still has some fatigue.  She states that she had a fall several weeks ago due to hypoglycemia. Thankfully she was not injured. She does not wish to use a cane or walker at this time.  She has not noted any blood loss. No abnormal bruising, no petechiae.  No fever, chills, n/v, cough, rash, dizziness, SOB, chest pain, palpitations, abdominal pain or changes in bowel or bladder habits.  She occasionally has constipation.  No swelling in her extremities at this time. She has some tenderness, numbness and tingling in her extremities due to arthritis.  She has maintained a good appetite and is staying well hydrated. Her weight is stable at 130 lbs.   ECOG Performance Status: 1 - Symptomatic but completely ambulatory  Medications:  Allergies as of 07/10/2020      Reactions   Macrobid [nitrofurantoin Macrocrystal] Nausea Only      Medication List       Accurate as of July 10, 2020  1:09 PM. If you have any questions, ask your nurse or doctor.        acetaminophen 500 MG tablet Commonly known as: TYLENOL Take 500-1,000 mg by mouth every 6 (six) hours as needed for mild pain, fever or headache.   escitalopram 10 MG tablet Commonly known as: LEXAPRO Take 10 mg by mouth daily.   losartan 100 MG tablet Commonly known as: COZAAR Take 100 mg by mouth daily.   oxybutynin 5 MG tablet Commonly known as: DITROPAN Take 5 mg by mouth daily.   [place] ARIA IV Inject into the vein every 8 (eight) weeks. For RA [facility] Rhematology.   traMADol 50 MG tablet Commonly known as: Ultram Take  1 tablet (50 mg total) by mouth every 6 (six) hours as needed.       Allergies:  Allergies  Allergen Reactions  . Macrobid [Nitrofurantoin Macrocrystal] Nausea Only    Past Medical History, Surgical history, Social history, and Family History were reviewed and updated.  Review of Systems: All other 10 point review of systems is negative.   Physical Exam:  vitals were not taken for this visit.   Wt Readings from Last 3 Encounters:  05/29/20 128 lb (58.1 kg)  04/17/20 127 lb 12.8 oz (58 kg)  01/30/20 134 lb (60.8 kg)    Ocular: Sclerae unicteric, pupils equal, round and reactive to light Ear-nose-throat: Oropharynx clear, dentition fair Lymphatic: No cervical or supraclavicular adenopathy Lungs no rales or rhonchi, good excursion bilaterally Heart regular rate and rhythm, no murmur appreciated Abd soft, nontender, positive bowel sounds MSK no focal spinal tenderness, no joint edema Neuro: non-focal, well-oriented, appropriate affect Breasts: Deferred   Lab Results  Component Value Date   WBC 8.6 07/10/2020   HGB 10.8 (L) 07/10/2020   HCT 31.3 (L) 07/10/2020   MCV 106.1 (H) 07/10/2020   PLT 267 07/10/2020   Lab Results  Component Value Date   FERRITIN 791 (H) 06/19/2020   IRON 70 06/19/2020   TIBC 163 (L) 06/19/2020   UIBC 93 (L) 06/19/2020   IRONPCTSAT 43 06/19/2020   Lab Results  Component Value Date   RETICCTPCT  1.3 04/17/2020   RBC 2.95 (L) 07/10/2020   Lab Results  Component Value Date   KPAFRELGTCHN 75.8 (H) 03/06/2020   LAMBDASER 44.2 (H) 03/06/2020   KAPLAMBRATIO 1.71 (H) 03/06/2020   Lab Results  Component Value Date   IGGSERUM 2,208 (H) 03/06/2020   IGA 389 03/06/2020   IGMSERUM 100 03/06/2020   Lab Results  Component Value Date   TOTALPROTELP 7.5 03/06/2020   ALBUMINELP 3.3 03/06/2020   A1GS 0.3 03/06/2020   A2GS 0.9 03/06/2020   BETS 1.0 03/06/2020   GAMS 2.0 (H) 03/06/2020   MSPIKE 1.0 (H) 03/06/2020   SPEI Comment 03/06/2020      Chemistry      Component Value Date/Time   NA 137 06/19/2020 1156   K 3.3 (L) 06/19/2020 1156   CL 103 06/19/2020 1156   CO2 28 06/19/2020 1156   BUN 21 06/19/2020 1156   CREATININE 0.87 06/19/2020 1156   CREATININE 1.19 (H) 05/07/2012 1553      Component Value Date/Time   CALCIUM 9.2 06/19/2020 1156   ALKPHOS 94 06/19/2020 1156   AST 14 (L) 06/19/2020 1156   ALT 6 06/19/2020 1156   BILITOT 0.5 06/19/2020 1156       Impression and Plan: Mallory Murphy is a very pleasant 75 yo caucasian female with multifactorial anemiaas well as IgG kappa MGUS. ESA given for Hgb 10.8.  Iron studies are pending. We will replace if needed.  Lab in 3 weeks, follow-up in 6 weeks.  She can contact our office with any questions or concerns.   Mallory Peace, NP 3/29/20221:09 PM

## 2020-07-11 LAB — IRON AND TIBC
Iron: 54 ug/dL (ref 41–142)
Saturation Ratios: 35 % (ref 21–57)
TIBC: 155 ug/dL — ABNORMAL LOW (ref 236–444)
UIBC: 101 ug/dL — ABNORMAL LOW (ref 120–384)

## 2020-07-11 LAB — FERRITIN: Ferritin: 596 ng/mL — ABNORMAL HIGH (ref 11–307)

## 2020-07-31 ENCOUNTER — Other Ambulatory Visit: Payer: Self-pay

## 2020-07-31 ENCOUNTER — Inpatient Hospital Stay: Payer: Medicare Other | Attending: Hematology & Oncology

## 2020-07-31 ENCOUNTER — Inpatient Hospital Stay: Payer: Medicare Other

## 2020-07-31 DIAGNOSIS — D631 Anemia in chronic kidney disease: Secondary | ICD-10-CM | POA: Diagnosis present

## 2020-07-31 DIAGNOSIS — N189 Chronic kidney disease, unspecified: Secondary | ICD-10-CM | POA: Insufficient documentation

## 2020-07-31 LAB — CBC WITH DIFFERENTIAL (CANCER CENTER ONLY)
Abs Immature Granulocytes: 0.02 10*3/uL (ref 0.00–0.07)
Basophils Absolute: 0 10*3/uL (ref 0.0–0.1)
Basophils Relative: 0 %
Eosinophils Absolute: 0.3 10*3/uL (ref 0.0–0.5)
Eosinophils Relative: 4 %
HCT: 35.7 % — ABNORMAL LOW (ref 36.0–46.0)
Hemoglobin: 12.2 g/dL (ref 12.0–15.0)
Immature Granulocytes: 0 %
Lymphocytes Relative: 32 %
Lymphs Abs: 2.5 10*3/uL (ref 0.7–4.0)
MCH: 37.4 pg — ABNORMAL HIGH (ref 26.0–34.0)
MCHC: 34.2 g/dL (ref 30.0–36.0)
MCV: 109.5 fL — ABNORMAL HIGH (ref 80.0–100.0)
Monocytes Absolute: 0.8 10*3/uL (ref 0.1–1.0)
Monocytes Relative: 10 %
Neutro Abs: 4.3 10*3/uL (ref 1.7–7.7)
Neutrophils Relative %: 54 %
Platelet Count: 328 10*3/uL (ref 150–400)
RBC: 3.26 MIL/uL — ABNORMAL LOW (ref 3.87–5.11)
RDW: 15.2 % (ref 11.5–15.5)
WBC Count: 7.9 10*3/uL (ref 4.0–10.5)
nRBC: 0 % (ref 0.0–0.2)

## 2020-07-31 LAB — CMP (CANCER CENTER ONLY)
ALT: 6 U/L (ref 0–44)
AST: 14 U/L — ABNORMAL LOW (ref 15–41)
Albumin: 3.6 g/dL (ref 3.5–5.0)
Alkaline Phosphatase: 89 U/L (ref 38–126)
Anion gap: 7 (ref 5–15)
BUN: 17 mg/dL (ref 8–23)
CO2: 27 mmol/L (ref 22–32)
Calcium: 9.2 mg/dL (ref 8.9–10.3)
Chloride: 103 mmol/L (ref 98–111)
Creatinine: 0.89 mg/dL (ref 0.44–1.00)
GFR, Estimated: >60 mL/min (ref >60)
Glucose, Bld: 100 mg/dL — ABNORMAL HIGH (ref 70–99)
Potassium: 3.7 mmol/L (ref 3.5–5.1)
Sodium: 137 mmol/L (ref 135–145)
Total Bilirubin: 0.6 mg/dL (ref 0.3–1.2)
Total Protein: 8 g/dL (ref 6.5–8.1)

## 2020-08-21 ENCOUNTER — Inpatient Hospital Stay: Payer: Medicare Other | Admitting: Family

## 2020-08-21 ENCOUNTER — Inpatient Hospital Stay: Payer: Medicare Other | Attending: Hematology & Oncology

## 2020-08-21 ENCOUNTER — Inpatient Hospital Stay: Payer: Medicare Other

## 2020-08-22 ENCOUNTER — Telehealth: Payer: Self-pay

## 2020-08-22 NOTE — Telephone Encounter (Signed)
S/w pt and she is aware of her new appt d/t Mallory Murphy

## 2020-09-12 ENCOUNTER — Inpatient Hospital Stay (HOSPITAL_BASED_OUTPATIENT_CLINIC_OR_DEPARTMENT_OTHER): Payer: Medicare Other | Admitting: Family

## 2020-09-12 ENCOUNTER — Other Ambulatory Visit: Payer: Self-pay

## 2020-09-12 ENCOUNTER — Telehealth: Payer: Self-pay | Admitting: *Deleted

## 2020-09-12 ENCOUNTER — Encounter: Payer: Self-pay | Admitting: Family

## 2020-09-12 ENCOUNTER — Inpatient Hospital Stay: Payer: Medicare Other | Attending: Hematology & Oncology

## 2020-09-12 ENCOUNTER — Inpatient Hospital Stay: Payer: Medicare Other

## 2020-09-12 VITALS — BP 127/60 | HR 90 | Temp 98.4°F | Resp 16 | Wt 134.0 lb

## 2020-09-12 DIAGNOSIS — D500 Iron deficiency anemia secondary to blood loss (chronic): Secondary | ICD-10-CM

## 2020-09-12 DIAGNOSIS — D472 Monoclonal gammopathy: Secondary | ICD-10-CM

## 2020-09-12 DIAGNOSIS — D631 Anemia in chronic kidney disease: Secondary | ICD-10-CM | POA: Insufficient documentation

## 2020-09-12 DIAGNOSIS — N189 Chronic kidney disease, unspecified: Secondary | ICD-10-CM | POA: Diagnosis present

## 2020-09-12 LAB — CMP (CANCER CENTER ONLY)
ALT: 6 U/L (ref 0–44)
AST: 13 U/L — ABNORMAL LOW (ref 15–41)
Albumin: 3.5 g/dL (ref 3.5–5.0)
Alkaline Phosphatase: 83 U/L (ref 38–126)
Anion gap: 8 (ref 5–15)
BUN: 18 mg/dL (ref 8–23)
CO2: 26 mmol/L (ref 22–32)
Calcium: 8.9 mg/dL (ref 8.9–10.3)
Chloride: 105 mmol/L (ref 98–111)
Creatinine: 0.85 mg/dL (ref 0.44–1.00)
GFR, Estimated: >60 mL/min (ref >60)
Glucose, Bld: 122 mg/dL — ABNORMAL HIGH (ref 70–99)
Potassium: 3.3 mmol/L — ABNORMAL LOW (ref 3.5–5.1)
Sodium: 139 mmol/L (ref 135–145)
Total Bilirubin: 0.3 mg/dL (ref 0.3–1.2)
Total Protein: 7.5 g/dL (ref 6.5–8.1)

## 2020-09-12 LAB — CBC WITH DIFFERENTIAL (CANCER CENTER ONLY)
Abs Immature Granulocytes: 0.09 10*3/uL — ABNORMAL HIGH (ref 0.00–0.07)
Basophils Absolute: 0 10*3/uL (ref 0.0–0.1)
Basophils Relative: 0 %
Eosinophils Absolute: 0.4 10*3/uL (ref 0.0–0.5)
Eosinophils Relative: 4 %
HCT: 28.9 % — ABNORMAL LOW (ref 36.0–46.0)
Hemoglobin: 9.8 g/dL — ABNORMAL LOW (ref 12.0–15.0)
Immature Granulocytes: 1 %
Lymphocytes Relative: 33 %
Lymphs Abs: 2.9 10*3/uL (ref 0.7–4.0)
MCH: 36.2 pg — ABNORMAL HIGH (ref 26.0–34.0)
MCHC: 33.9 g/dL (ref 30.0–36.0)
MCV: 106.6 fL — ABNORMAL HIGH (ref 80.0–100.0)
Monocytes Absolute: 0.8 10*3/uL (ref 0.1–1.0)
Monocytes Relative: 9 %
Neutro Abs: 4.8 10*3/uL (ref 1.7–7.7)
Neutrophils Relative %: 53 %
Platelet Count: 304 10*3/uL (ref 150–400)
RBC: 2.71 MIL/uL — ABNORMAL LOW (ref 3.87–5.11)
RDW: 14.7 % (ref 11.5–15.5)
WBC Count: 9 10*3/uL (ref 4.0–10.5)
nRBC: 0 % (ref 0.0–0.2)

## 2020-09-12 LAB — RETICULOCYTES
Immature Retic Fract: 14.6 % (ref 2.3–15.9)
RBC.: 2.72 MIL/uL — ABNORMAL LOW (ref 3.87–5.11)
Retic Count, Absolute: 28.8 10*3/uL (ref 19.0–186.0)
Retic Ct Pct: 1.1 % (ref 0.4–3.1)

## 2020-09-12 MED ORDER — DARBEPOETIN ALFA 300 MCG/0.6ML IJ SOSY
300.0000 ug | PREFILLED_SYRINGE | Freq: Once | INTRAMUSCULAR | Status: AC
  Administered 2020-09-12: 300 ug via SUBCUTANEOUS

## 2020-09-12 NOTE — Patient Instructions (Signed)
Darbepoetin Alfa injection What is this medicine? DARBEPOETIN ALFA (dar be POE e tin AL fa) helps your body make more red blood cells. It is used to treat anemia caused by chronic kidney failure and chemotherapy. This medicine may be used for other purposes; ask your health care provider or pharmacist if you have questions. COMMON BRAND NAME(S): Aranesp What should I tell my health care provider before I take this medicine? They need to know if you have any of these conditions:  blood clotting disorders or history of blood clots  cancer patient not on chemotherapy  cystic fibrosis  heart disease, such as angina, heart failure, or a history of a heart attack  hemoglobin level of 12 g/dL or greater  high blood pressure  low levels of folate, iron, or vitamin B12  seizures  an unusual or allergic reaction to darbepoetin, erythropoietin, albumin, hamster proteins, latex, other medicines, foods, dyes, or preservatives  pregnant or trying to get pregnant  breast-feeding How should I use this medicine? This medicine is for injection into a vein or under the skin. It is usually given by a health care professional in a hospital or clinic setting. If you get this medicine at home, you will be taught how to prepare and give this medicine. Use exactly as directed. Take your medicine at regular intervals. Do not take your medicine more often than directed. It is important that you put your used needles and syringes in a special sharps container. Do not put them in a trash can. If you do not have a sharps container, call your pharmacist or healthcare provider to get one. A special MedGuide will be given to you by the pharmacist with each prescription and refill. Be sure to read this information carefully each time. Talk to your pediatrician regarding the use of this medicine in children. While this medicine may be used in children as young as 1 month of age for selected conditions, precautions do  apply. Overdosage: If you think you have taken too much of this medicine contact a poison control center or emergency room at once. NOTE: This medicine is only for you. Do not share this medicine with others. What if I miss a dose? If you miss a dose, take it as soon as you can. If it is almost time for your next dose, take only that dose. Do not take double or extra doses. What may interact with this medicine? Do not take this medicine with any of the following medications:  epoetin alfa This list may not describe all possible interactions. Give your health care provider a list of all the medicines, herbs, non-prescription drugs, or dietary supplements you use. Also tell them if you smoke, drink alcohol, or use illegal drugs. Some items may interact with your medicine. What should I watch for while using this medicine? Your condition will be monitored carefully while you are receiving this medicine. You may need blood work done while you are taking this medicine. This medicine may cause a decrease in vitamin B6. You should make sure that you get enough vitamin B6 while you are taking this medicine. Discuss the foods you eat and the vitamins you take with your health care professional. What side effects may I notice from receiving this medicine? Side effects that you should report to your doctor or health care professional as soon as possible:  allergic reactions like skin rash, itching or hives, swelling of the face, lips, or tongue  breathing problems  changes in   vision  chest pain  confusion, trouble speaking or understanding  feeling faint or lightheaded, falls  high blood pressure  muscle aches or pains  pain, swelling, warmth in the leg  rapid weight gain  severe headaches  sudden numbness or weakness of the face, arm or leg  trouble walking, dizziness, loss of balance or coordination  seizures (convulsions)  swelling of the ankles, feet, hands  unusually weak or  tired Side effects that usually do not require medical attention (report to your doctor or health care professional if they continue or are bothersome):  diarrhea  fever, chills (flu-like symptoms)  headaches  nausea, vomiting  redness, stinging, or swelling at site where injected This list may not describe all possible side effects. Call your doctor for medical advice about side effects. You may report side effects to FDA at 1-800-FDA-1088. Where should I keep my medicine? Keep out of the reach of children. Store in a refrigerator between 2 and 8 degrees C (36 and 46 degrees F). Do not freeze. Do not shake. Throw away any unused portion if using a single-dose vial. Throw away any unused medicine after the expiration date. NOTE: This sheet is a summary. It may not cover all possible information. If you have questions about this medicine, talk to your doctor, pharmacist, or health care provider.  2021 Elsevier/Gold Standard (2017-04-15 16:44:20)  

## 2020-09-12 NOTE — Telephone Encounter (Signed)
09/12/20 los -  Gave patient upcoming appointments - calendar

## 2020-09-12 NOTE — Progress Notes (Signed)
Hematology and Oncology Follow Up Visit  Mallory Murphy [id number] 04/12/1946 75 y.o. 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 She is doing well and has no complaints at this time.  No blood loss noted. No bruising or petechiae.  Hgn is 9.8, MCV 106, WBC count 9.0 and platelets 304. No fever, chills, n/v, cough, rash, dizziness, SOB, chest pain, palpitations, abdominal pain or changes in bowel or bladder habits.  Tenderness, numbness and tingling in her hands and feet waxes and wanes with arthritis.  Pedal pulses are 2+.  No new falls or syncope to report.  She has maintained a good appetite and is staying well hydrated. Her weight is stable.   ECOG Performance Status: 1 - Symptomatic but completely ambulatory  Medications:  Allergies as of 09/12/2020      Reactions   Macrobid [nitrofurantoin Macrocrystal] Nausea Only      Medication List       Accurate as of September 12, 2020  3:54 PM. If you have any questions, ask your nurse or doctor.        acetaminophen 500 MG tablet Commonly known as: TYLENOL Take 500-1,000 mg by mouth every 6 (six) hours as needed for mild pain, fever or headache.   calcitonin (salmon) 200 UNIT/ACT nasal spray Commonly known as: MIACALCIN/FORTICAL SMARTSIG:Both Nares   escitalopram 10 MG tablet Commonly known as: LEXAPRO Take 10 mg by mouth daily.   losartan 100 MG tablet Commonly known as: COZAAR Take 100 mg by mouth daily.   oxybutynin 5 MG tablet Commonly known as: DITROPAN Take 5 mg by mouth daily.   [place] ARIA IV Inject into the vein every 8 (eight) weeks. For RA [facility] Rhematology.   traMADol 50 MG tablet Commonly known as: Ultram Take 1 tablet (50 mg total) by mouth every 6  (six) hours as needed.       Allergies:  Allergies  Allergen Reactions  . Macrobid [Nitrofurantoin Macrocrystal] Nausea Only    Past Medical History, Surgical history, Social history, and Family History were reviewed and updated.  Review of Systems: All other 10 point review of systems is negative.   Physical Exam:  vitals were not taken for this visit.   Wt Readings from Last 3 Encounters:  07/10/20 130 lb 6.4 oz (59.1 kg)  05/29/20 128 lb (58.1 kg)  04/17/20 127 lb 12.8 oz (58 kg)    Ocular: Sclerae unicteric, pupils equal, round and reactive to light Ear-nose-throat: Oropharynx clear, dentition fair Lymphatic: No cervical or supraclavicular adenopathy Lungs no rales or rhonchi, good excursion bilaterally Heart regular rate and rhythm, no murmur appreciated Abd soft, nontender, positive bowel sounds MSK no focal spinal tenderness, no joint edema Neuro: non-focal, well-oriented, appropriate affect Breasts: Deferred   Lab Results  Component Value Date   WBC 9.0 09/12/2020   HGB 9.8 (L) 09/12/2020   HCT 28.9 (L) 09/12/2020   MCV 106.6 (H) 09/12/2020   PLT 304 09/12/2020   Lab Results  Component Value Date   FERRITIN 596 (H) 07/10/2020   IRON 54 07/10/2020   TIBC 155 (L) 07/10/2020   UIBC 101 (L) 07/10/2020   IRONPCTSAT 35 07/10/2020   Lab Results  Component Value Date   RETICCTPCT 1.1 09/12/2020   RBC 2.72 (L) 09/12/2020  Lab Results  Component Value Date   KPAFRELGTCHN 75.8 (H) 03/06/2020   LAMBDASER 44.2 (H) 03/06/2020   KAPLAMBRATIO 1.71 (H) 03/06/2020   Lab Results  Component Value Date   IGGSERUM 2,208 (H) 03/06/2020   IGA 389 03/06/2020   IGMSERUM 100 03/06/2020   Lab Results  Component Value Date   TOTALPROTELP 7.5 03/06/2020   ALBUMINELP 3.3 03/06/2020   A1GS 0.3 03/06/2020   A2GS 0.9 03/06/2020   BETS 1.0 03/06/2020   GAMS 2.0 (H) 03/06/2020   MSPIKE 1.0 (H) 03/06/2020   SPEI Comment 03/06/2020     Chemistry      Component  Value Date/Time   NA 137 07/31/2020 1314   K 3.7 07/31/2020 1314   CL 103 07/31/2020 1314   CO2 27 07/31/2020 1314   BUN 17 07/31/2020 1314   CREATININE 0.89 07/31/2020 1314   CREATININE 1.19 (H) 05/07/2012 1553      Component Value Date/Time   CALCIUM 9.2 07/31/2020 1314   ALKPHOS 89 07/31/2020 1314   AST 14 (L) 07/31/2020 1314   ALT 6 07/31/2020 1314   BILITOT 0.6 07/31/2020 1314       Impression and Plan: Mallory Murphy is a very pleasant 75 yo caucasian female with multifactorial anemiaas well as IgG kappa MGUS. Protein studies are pending.  ESA given, Hgb 9.8.  Iron studies are pending. We will replace if needed.  Lab every 3 weeks with injection and follow-up in 6 weeks.  She can contact our office with any questions or concerns.   Laverna Peace, NP 6/1/20223:54 PM

## 2020-09-13 LAB — PROTEIN ELECTROPHORESIS, SERUM
A/G Ratio: 0.8 (ref 0.7–1.7)
Albumin ELP: 3.1 g/dL (ref 2.9–4.4)
Alpha-1-Globulin: 0.3 g/dL (ref 0.0–0.4)
Alpha-2-Globulin: 0.7 g/dL (ref 0.4–1.0)
Beta Globulin: 0.9 g/dL (ref 0.7–1.3)
Gamma Globulin: 2.1 g/dL — ABNORMAL HIGH (ref 0.4–1.8)
Globulin, Total: 4 g/dL — ABNORMAL HIGH (ref 2.2–3.9)
M-Spike, %: 1 g/dL — ABNORMAL HIGH
Total Protein ELP: 7.1 g/dL (ref 6.0–8.5)

## 2020-09-13 LAB — IRON AND TIBC
Iron: 53 ug/dL (ref 41–142)
Saturation Ratios: 33 % (ref 21–57)
TIBC: 159 ug/dL — ABNORMAL LOW (ref 236–444)
UIBC: 106 ug/dL — ABNORMAL LOW (ref 120–384)

## 2020-09-13 LAB — IGG, IGA, IGM
IgA: 398 mg/dL (ref 64–422)
IgG (Immunoglobin G), Serum: 2143 mg/dL — ABNORMAL HIGH (ref 586–1602)
IgM (Immunoglobulin M), Srm: 103 mg/dL (ref 26–217)

## 2020-09-13 LAB — KAPPA/LAMBDA LIGHT CHAINS
Kappa free light chain: 76.3 mg/L — ABNORMAL HIGH (ref 3.3–19.4)
Kappa, lambda light chain ratio: 1.45 (ref 0.26–1.65)
Lambda free light chains: 52.8 mg/L — ABNORMAL HIGH (ref 5.7–26.3)

## 2020-09-13 LAB — FERRITIN: Ferritin: 516 ng/mL — ABNORMAL HIGH (ref 11–307)

## 2020-10-03 ENCOUNTER — Inpatient Hospital Stay: Payer: Medicare Other

## 2020-10-03 ENCOUNTER — Other Ambulatory Visit: Payer: Self-pay

## 2020-10-03 DIAGNOSIS — N189 Chronic kidney disease, unspecified: Secondary | ICD-10-CM | POA: Diagnosis not present

## 2020-10-03 DIAGNOSIS — D631 Anemia in chronic kidney disease: Secondary | ICD-10-CM

## 2020-10-03 LAB — CMP (CANCER CENTER ONLY)
ALT: 6 U/L (ref 0–44)
AST: 15 U/L (ref 15–41)
Albumin: 3.8 g/dL (ref 3.5–5.0)
Alkaline Phosphatase: 75 U/L (ref 38–126)
Anion gap: 9 (ref 5–15)
BUN: 18 mg/dL (ref 8–23)
CO2: 24 mmol/L (ref 22–32)
Calcium: 9.5 mg/dL (ref 8.9–10.3)
Chloride: 104 mmol/L (ref 98–111)
Creatinine: 0.85 mg/dL (ref 0.44–1.00)
GFR, Estimated: >60 mL/min (ref >60)
Glucose, Bld: 108 mg/dL — ABNORMAL HIGH (ref 70–99)
Potassium: 3.7 mmol/L (ref 3.5–5.1)
Sodium: 137 mmol/L (ref 135–145)
Total Bilirubin: 0.6 mg/dL (ref 0.3–1.2)
Total Protein: 7.9 g/dL (ref 6.5–8.1)

## 2020-10-03 LAB — CBC WITH DIFFERENTIAL (CANCER CENTER ONLY)
Abs Immature Granulocytes: 0.02 10*3/uL (ref 0.00–0.07)
Basophils Absolute: 0 10*3/uL (ref 0.0–0.1)
Basophils Relative: 0 %
Eosinophils Absolute: 0.3 10*3/uL (ref 0.0–0.5)
Eosinophils Relative: 3 %
HCT: 35.5 % — ABNORMAL LOW (ref 36.0–46.0)
Hemoglobin: 12 g/dL (ref 12.0–15.0)
Immature Granulocytes: 0 %
Lymphocytes Relative: 33 %
Lymphs Abs: 2.7 10*3/uL (ref 0.7–4.0)
MCH: 37.5 pg — ABNORMAL HIGH (ref 26.0–34.0)
MCHC: 33.8 g/dL (ref 30.0–36.0)
MCV: 110.9 fL — ABNORMAL HIGH (ref 80.0–100.0)
Monocytes Absolute: 0.6 10*3/uL (ref 0.1–1.0)
Monocytes Relative: 8 %
Neutro Abs: 4.6 10*3/uL (ref 1.7–7.7)
Neutrophils Relative %: 56 %
Platelet Count: 306 10*3/uL (ref 150–400)
RBC: 3.2 MIL/uL — ABNORMAL LOW (ref 3.87–5.11)
RDW: 14.8 % (ref 11.5–15.5)
WBC Count: 8.2 10*3/uL (ref 4.0–10.5)
nRBC: 0 % (ref 0.0–0.2)

## 2020-10-23 ENCOUNTER — Telehealth: Payer: Self-pay

## 2020-10-24 ENCOUNTER — Other Ambulatory Visit: Payer: Medicare Other

## 2020-10-24 ENCOUNTER — Ambulatory Visit: Payer: Medicare Other | Admitting: Family

## 2020-10-24 ENCOUNTER — Ambulatory Visit: Payer: Medicare Other

## 2020-11-08 ENCOUNTER — Encounter: Payer: Self-pay | Admitting: Family

## 2020-11-08 ENCOUNTER — Other Ambulatory Visit: Payer: Self-pay

## 2020-11-08 ENCOUNTER — Inpatient Hospital Stay: Payer: Medicare Other | Attending: Hematology & Oncology

## 2020-11-08 ENCOUNTER — Inpatient Hospital Stay (HOSPITAL_BASED_OUTPATIENT_CLINIC_OR_DEPARTMENT_OTHER): Payer: Medicare Other | Admitting: Family

## 2020-11-08 ENCOUNTER — Inpatient Hospital Stay: Payer: Medicare Other

## 2020-11-08 VITALS — BP 111/48 | HR 72 | Temp 98.2°F | Resp 18 | Ht 61.0 in | Wt 136.1 lb

## 2020-11-08 DIAGNOSIS — D500 Iron deficiency anemia secondary to blood loss (chronic): Secondary | ICD-10-CM

## 2020-11-08 DIAGNOSIS — D631 Anemia in chronic kidney disease: Secondary | ICD-10-CM

## 2020-11-08 DIAGNOSIS — N189 Chronic kidney disease, unspecified: Secondary | ICD-10-CM | POA: Diagnosis not present

## 2020-11-08 DIAGNOSIS — D472 Monoclonal gammopathy: Secondary | ICD-10-CM

## 2020-11-08 LAB — CMP (CANCER CENTER ONLY)
ALT: <6 U/L (ref 0–44)
AST: 11 U/L — ABNORMAL LOW (ref 15–41)
Albumin: 3.4 g/dL — ABNORMAL LOW (ref 3.5–5.0)
Alkaline Phosphatase: 79 U/L (ref 38–126)
Anion gap: 7 (ref 5–15)
BUN: 17 mg/dL (ref 8–23)
CO2: 25 mmol/L (ref 22–32)
Calcium: 8.6 mg/dL — ABNORMAL LOW (ref 8.9–10.3)
Chloride: 103 mmol/L (ref 98–111)
Creatinine: 0.79 mg/dL (ref 0.44–1.00)
GFR, Estimated: >60 mL/min (ref >60)
Glucose, Bld: 123 mg/dL — ABNORMAL HIGH (ref 70–99)
Potassium: 3.5 mmol/L (ref 3.5–5.1)
Sodium: 135 mmol/L (ref 135–145)
Total Bilirubin: 0.2 mg/dL — ABNORMAL LOW (ref 0.3–1.2)
Total Protein: 8.2 g/dL — ABNORMAL HIGH (ref 6.5–8.1)

## 2020-11-08 LAB — CBC WITH DIFFERENTIAL (CANCER CENTER ONLY)
Abs Immature Granulocytes: 0.03 10*3/uL (ref 0.00–0.07)
Basophils Absolute: 0 10*3/uL (ref 0.0–0.1)
Basophils Relative: 0 %
Eosinophils Absolute: 0.4 10*3/uL (ref 0.0–0.5)
Eosinophils Relative: 5 %
HCT: 32.5 % — ABNORMAL LOW (ref 36.0–46.0)
Hemoglobin: 10.9 g/dL — ABNORMAL LOW (ref 12.0–15.0)
Immature Granulocytes: 0 %
Lymphocytes Relative: 33 %
Lymphs Abs: 2.7 10*3/uL (ref 0.7–4.0)
MCH: 37.6 pg — ABNORMAL HIGH (ref 26.0–34.0)
MCHC: 33.5 g/dL (ref 30.0–36.0)
MCV: 112.1 fL — ABNORMAL HIGH (ref 80.0–100.0)
Monocytes Absolute: 0.5 10*3/uL (ref 0.1–1.0)
Monocytes Relative: 6 %
Neutro Abs: 4.6 10*3/uL (ref 1.7–7.7)
Neutrophils Relative %: 56 %
Platelet Count: 291 10*3/uL (ref 150–400)
RBC: 2.9 MIL/uL — ABNORMAL LOW (ref 3.87–5.11)
RDW: 13.8 % (ref 11.5–15.5)
WBC Count: 8.3 10*3/uL (ref 4.0–10.5)
nRBC: 0 % (ref 0.0–0.2)

## 2020-11-08 MED ORDER — DARBEPOETIN ALFA 300 MCG/0.6ML IJ SOSY
300.0000 ug | PREFILLED_SYRINGE | Freq: Once | INTRAMUSCULAR | Status: AC
  Administered 2020-11-08: 300 ug via SUBCUTANEOUS

## 2020-11-08 NOTE — Patient Instructions (Signed)
Darbepoetin Alfa injection What is this medication? DARBEPOETIN ALFA (dar be POE e tin AL fa) helps your body make more red blood cells. It is used to treat anemia caused by chronic kidney failure and chemotherapy. This medicine may be used for other purposes; ask your health care provider or pharmacist if you have questions. COMMON BRAND NAME(S): Aranesp What should I tell my care team before I take this medication? They need to know if you have any of these conditions: blood clotting disorders or history of blood clots cancer patient not on chemotherapy cystic fibrosis heart disease, such as angina, heart failure, or a history of a heart attack hemoglobin level of 12 g/dL or greater high blood pressure low levels of folate, iron, or vitamin B12 seizures an unusual or allergic reaction to darbepoetin, erythropoietin, albumin, hamster proteins, latex, other medicines, foods, dyes, or preservatives pregnant or trying to get pregnant breast-feeding How should I use this medication? This medicine is for injection into a vein or under the skin. It is usually given by a health care professional in a hospital or clinic setting. If you get this medicine at home, you will be taught how to prepare and give this medicine. Use exactly as directed. Take your medicine at regular intervals. Do not take your medicine more often than directed. It is important that you put your used needles and syringes in a special sharps container. Do not put them in a trash can. If you do not have a sharps container, call your pharmacist or healthcare provider to get one. A special MedGuide will be given to you by the pharmacist with each prescription and refill. Be sure to read this information carefully each time. Talk to your pediatrician regarding the use of this medicine in children. While this medicine may be used in children as young as 1 month of age for selected conditions, precautions do apply. Overdosage: If you  think you have taken too much of this medicine contact a poison control center or emergency room at once. NOTE: This medicine is only for you. Do not share this medicine with others. What if I miss a dose? If you miss a dose, take it as soon as you can. If it is almost time for your next dose, take only that dose. Do not take double or extra doses. What may interact with this medication? Do not take this medicine with any of the following medications: epoetin alfa This list may not describe all possible interactions. Give your health care provider a list of all the medicines, herbs, non-prescription drugs, or dietary supplements you use. Also tell them if you smoke, drink alcohol, or use illegal drugs. Some items may interact with your medicine. What should I watch for while using this medication? Your condition will be monitored carefully while you are receiving this medicine. You may need blood work done while you are taking this medicine. This medicine may cause a decrease in vitamin B6. You should make sure that you get enough vitamin B6 while you are taking this medicine. Discuss the foods you eat and the vitamins you take with your health care professional. What side effects may I notice from receiving this medication? Side effects that you should report to your doctor or health care professional as soon as possible: allergic reactions like skin rash, itching or hives, swelling of the face, lips, or tongue breathing problems changes in vision chest pain confusion, trouble speaking or understanding feeling faint or lightheaded, falls high blood pressure   muscle aches or pains pain, swelling, warmth in the leg rapid weight gain severe headaches sudden numbness or weakness of the face, arm or leg trouble walking, dizziness, loss of balance or coordination seizures (convulsions) swelling of the ankles, feet, hands unusually weak or tired Side effects that usually do not require medical  attention (report to your doctor or health care professional if they continue or are bothersome): diarrhea fever, chills (flu-like symptoms) headaches nausea, vomiting redness, stinging, or swelling at site where injected This list may not describe all possible side effects. Call your doctor for medical advice about side effects. You may report side effects to FDA at 1-800-FDA-1088. Where should I keep my medication? Keep out of the reach of children. Store in a refrigerator between 2 and 8 degrees C (36 and 46 degrees F). Do not freeze. Do not shake. Throw away any unused portion if using a single-dose vial. Throw away any unused medicine after the expiration date. NOTE: This sheet is a summary. It may not cover all possible information. If you have questions about this medicine, talk to your doctor, pharmacist, or health care provider.  2022 Elsevier/Gold Standard (2017-04-15 16:44:20)  

## 2020-11-08 NOTE — Progress Notes (Signed)
Hematology and Oncology Follow Up Visit  Mallory Murphy:[id number] August 05, 1945 75 y.o. 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 She is doing well but notes some fatigue today.  No blood loss, bruising or petechiae.  She has been using her Rolator when ambulating for added support.  No falls or syncope to report.  She has occasional SOB with exertion and rests when needed.  No fever, chills, n/v, cough, rash, dizziness, chest pain, palpitations, abdominal pain or changes in bowel or bladder habits.  Tenderness, numbness and tingling in her upper and lower extremities with arthritis is unchanged from baseline.  She has been eating well and staying hydrated. Her weight is stable at 136 lbs.   ECOG Performance Status: 1 - Symptomatic but completely ambulatory  Medications:  Allergies as of 11/08/2020       Reactions   Macrobid [nitrofurantoin Macrocrystal] Nausea Only        Medication List        Accurate as of November 08, 2020  2:00 PM. If you have any questions, ask your nurse or doctor.          STOP taking these medications    losartan 100 MG tablet Commonly known as: COZAAR Stopped by: Laverna Peace, NP       TAKE these medications    acetaminophen 500 MG tablet Commonly known as: TYLENOL Take 500-1,000 mg by mouth every 6 (six) hours as needed for mild pain, fever or headache.   calcitonin (salmon) 200 UNIT/ACT nasal spray Commonly known as: MIACALCIN/FORTICAL   escitalopram 10 MG tablet Commonly known as: LEXAPRO Take 10 mg by mouth daily.   losartan-hydrochlorothiazide 50-12.5 MG tablet Commonly known as: HYZAAR Take 1 tablet by mouth daily.   oxybutynin 5 MG tablet Commonly known as:  DITROPAN Take 5 mg by mouth daily.   [place] ARIA IV Inject into the vein every 8 (eight) weeks. For RA [facility] Rhematology.   traMADol 50 MG tablet Commonly known as: Ultram Take 1 tablet (50 mg total) by mouth every 6 (six) hours as needed.        Allergies:  Allergies  Allergen Reactions   Macrobid [Nitrofurantoin Macrocrystal] Nausea Only    Past Medical History, Surgical history, Social history, and Family History were reviewed and updated.  Review of Systems: All other 10 point review of systems is negative.   Physical Exam:  height is '5\' 1"'$  (1.549 m) and weight is 136 lb 1.9 oz (61.7 kg). Her oral temperature is 98.2 F (36.8 C). Her blood pressure is 111/48 (abnormal) and her pulse is 72. Her respiration is 18 and oxygen saturation is 98%.   Wt Readings from Last 3 Encounters:  11/08/20 136 lb 1.9 oz (61.7 kg)  09/12/20 134 lb (60.8 kg)  07/10/20 130 lb 6.4 oz (59.1 kg)    Ocular: Sclerae unicteric, pupils equal, round and reactive to light Ear-nose-throat: Oropharynx clear, dentition fair Lymphatic: No cervical or supraclavicular adenopathy Lungs no rales or rhonchi, good excursion bilaterally Heart regular rate and rhythm, no murmur appreciated Abd soft, nontender, positive bowel sounds MSK no focal spinal tenderness, no joint edema Neuro: non-focal, well-oriented, appropriate affect Breasts: Deferred   Lab Results  Component Value Date   WBC 8.3 11/08/2020   HGB 10.9 (L) 11/08/2020   HCT 32.5 (L) 11/08/2020   MCV 112.1 (H) 11/08/2020   PLT 291 11/08/2020   Lab Results  Component Value Date   FERRITIN 516 (H) 09/12/2020   IRON 53 09/12/2020   TIBC 159 (L) 09/12/2020   UIBC 106 (L) 09/12/2020   IRONPCTSAT 33 09/12/2020   Lab Results  Component Value Date   RETICCTPCT 1.1 09/12/2020   RBC 2.90 (L) 11/08/2020   Lab Results  Component Value Date   KPAFRELGTCHN 76.3 (H) 09/12/2020   LAMBDASER 52.8 (H) 09/12/2020   KAPLAMBRATIO 1.45  09/12/2020   Lab Results  Component Value Date   IGGSERUM 2,143 (H) 09/12/2020   IGA 398 09/12/2020   IGMSERUM 103 09/12/2020   Lab Results  Component Value Date   TOTALPROTELP 7.1 09/12/2020   ALBUMINELP 3.1 09/12/2020   A1GS 0.3 09/12/2020   A2GS 0.7 09/12/2020   BETS 0.9 09/12/2020   GAMS 2.1 (H) 09/12/2020   MSPIKE 1.0 (H) 09/12/2020   SPEI Comment 09/12/2020     Chemistry      Component Value Date/Time   NA 135 11/08/2020 1333   K 3.5 11/08/2020 1333   CL 103 11/08/2020 1333   CO2 25 11/08/2020 1333   BUN 17 11/08/2020 1333   CREATININE 0.79 11/08/2020 1333   CREATININE 1.19 (H) 05/07/2012 1553      Component Value Date/Time   CALCIUM 8.6 (L) 11/08/2020 1333   ALKPHOS 79 11/08/2020 1333   AST 11 (L) 11/08/2020 1333   ALT <6 11/08/2020 1333   BILITOT 0.2 (L) 11/08/2020 1333       Impression and Plan: Mallory Murphy is a very pleasant 75 yo caucasian female with multifactorial anemia as well as IgG kappa MGUS.  ESA given for Hgb 10.9.  Iron studies pending. We will replace if needed.  Lab and injection in 3 weeks and follow-up and lab in 6 weeks.  She can contact our office with any questions or concerns.   Laverna Peace, NP 7/28/20222:00 PM

## 2020-11-09 LAB — IRON AND TIBC
Iron: 87 ug/dL (ref 41–142)
Saturation Ratios: 50 % (ref 21–57)
TIBC: 175 ug/dL — ABNORMAL LOW (ref 236–444)
UIBC: 88 ug/dL — ABNORMAL LOW (ref 120–384)

## 2020-11-09 LAB — FERRITIN: Ferritin: 538 ng/mL — ABNORMAL HIGH (ref 11–307)

## 2020-11-12 ENCOUNTER — Telehealth: Payer: Self-pay | Admitting: *Deleted

## 2020-11-12 NOTE — Telephone Encounter (Signed)
Per 11/08/20 los - called and lvm of upcoming appointments - requested callback to confirm - mailed calendar

## 2020-11-29 ENCOUNTER — Inpatient Hospital Stay: Payer: Medicare Other

## 2020-11-29 ENCOUNTER — Ambulatory Visit
Admission: RE | Admit: 2020-11-29 | Discharge: 2020-11-29 | Disposition: A | Discharge status: Home / Self Care | Payer: Medicare Other | Source: Ambulatory Visit | Attending: Internal Medicine | Admitting: Internal Medicine

## 2020-11-29 ENCOUNTER — Other Ambulatory Visit: Payer: Self-pay

## 2020-11-29 ENCOUNTER — Inpatient Hospital Stay: Payer: Medicare Other | Attending: Hematology & Oncology

## 2020-11-29 VITALS — BP 94/58 | HR 101 | Temp 99.0°F | Resp 17

## 2020-11-29 DIAGNOSIS — N189 Chronic kidney disease, unspecified: Secondary | ICD-10-CM | POA: Diagnosis not present

## 2020-11-29 DIAGNOSIS — M810 Age-related osteoporosis without current pathological fracture: Secondary | ICD-10-CM

## 2020-11-29 DIAGNOSIS — D631 Anemia in chronic kidney disease: Secondary | ICD-10-CM

## 2020-11-29 DIAGNOSIS — D500 Iron deficiency anemia secondary to blood loss (chronic): Secondary | ICD-10-CM

## 2020-11-29 LAB — CMP (CANCER CENTER ONLY)
ALT: 5 U/L (ref 0–44)
AST: 13 U/L — ABNORMAL LOW (ref 15–41)
Albumin: 3.6 g/dL (ref 3.5–5.0)
Alkaline Phosphatase: 86 U/L (ref 38–126)
Anion gap: 8 (ref 5–15)
BUN: 17 mg/dL (ref 8–23)
CO2: 27 mmol/L (ref 22–32)
Calcium: 9.3 mg/dL (ref 8.9–10.3)
Chloride: 104 mmol/L (ref 98–111)
Creatinine: 1 mg/dL (ref 0.44–1.00)
GFR, Estimated: 59 mL/min — ABNORMAL LOW (ref >60)
Glucose, Bld: 114 mg/dL — ABNORMAL HIGH (ref 70–99)
Potassium: 4.2 mmol/L (ref 3.5–5.1)
Sodium: 139 mmol/L (ref 135–145)
Total Bilirubin: 0.5 mg/dL (ref 0.3–1.2)
Total Protein: 8.2 g/dL — ABNORMAL HIGH (ref 6.5–8.1)

## 2020-11-29 LAB — CBC WITH DIFFERENTIAL (CANCER CENTER ONLY)
Abs Immature Granulocytes: 0.01 10*3/uL (ref 0.00–0.07)
Basophils Absolute: 0 10*3/uL (ref 0.0–0.1)
Basophils Relative: 0 %
Eosinophils Absolute: 0.3 10*3/uL (ref 0.0–0.5)
Eosinophils Relative: 4 %
HCT: 34.2 % — ABNORMAL LOW (ref 36.0–46.0)
Hemoglobin: 11.6 g/dL — ABNORMAL LOW (ref 12.0–15.0)
Immature Granulocytes: 0 %
Lymphocytes Relative: 32 %
Lymphs Abs: 2.5 10*3/uL (ref 0.7–4.0)
MCH: 38.5 pg — ABNORMAL HIGH (ref 26.0–34.0)
MCHC: 33.9 g/dL (ref 30.0–36.0)
MCV: 113.6 fL — ABNORMAL HIGH (ref 80.0–100.0)
Monocytes Absolute: 0.7 10*3/uL (ref 0.1–1.0)
Monocytes Relative: 9 %
Neutro Abs: 4.3 10*3/uL (ref 1.7–7.7)
Neutrophils Relative %: 55 %
Platelet Count: 319 10*3/uL (ref 150–400)
RBC: 3.01 MIL/uL — ABNORMAL LOW (ref 3.87–5.11)
RDW: 14.5 % (ref 11.5–15.5)
WBC Count: 7.8 10*3/uL (ref 4.0–10.5)
nRBC: 0 % (ref 0.0–0.2)

## 2020-11-29 MED ORDER — DARBEPOETIN ALFA 300 MCG/0.6ML IJ SOSY: 300.0000 ug | PREFILLED_SYRINGE | Freq: Once | INTRAMUSCULAR | Status: DC

## 2020-11-29 NOTE — Addendum Note (Signed)
Addended by: Arbutus Ped on: 11/29/2020 01:35 PM   Modules accepted: Orders

## 2020-12-19 ENCOUNTER — Inpatient Hospital Stay: Payer: Medicare Other | Attending: Hematology & Oncology

## 2020-12-19 ENCOUNTER — Other Ambulatory Visit: Payer: Self-pay

## 2020-12-19 ENCOUNTER — Inpatient Hospital Stay (HOSPITAL_BASED_OUTPATIENT_CLINIC_OR_DEPARTMENT_OTHER): Payer: Medicare Other | Admitting: Family

## 2020-12-19 ENCOUNTER — Encounter: Payer: Self-pay | Admitting: Family

## 2020-12-19 ENCOUNTER — Inpatient Hospital Stay: Payer: Medicare Other

## 2020-12-19 ENCOUNTER — Telehealth: Payer: Self-pay | Admitting: *Deleted

## 2020-12-19 VITALS — BP 134/56 | HR 95 | Temp 98.3°F | Resp 17 | Ht 61.0 in | Wt 136.0 lb

## 2020-12-19 DIAGNOSIS — D500 Iron deficiency anemia secondary to blood loss (chronic): Secondary | ICD-10-CM | POA: Diagnosis not present

## 2020-12-19 DIAGNOSIS — D631 Anemia in chronic kidney disease: Secondary | ICD-10-CM | POA: Diagnosis not present

## 2020-12-19 DIAGNOSIS — N189 Chronic kidney disease, unspecified: Secondary | ICD-10-CM | POA: Insufficient documentation

## 2020-12-19 DIAGNOSIS — D472 Monoclonal gammopathy: Secondary | ICD-10-CM | POA: Diagnosis not present

## 2020-12-19 LAB — CBC WITH DIFFERENTIAL (CANCER CENTER ONLY)
Abs Immature Granulocytes: 0.02 10*3/uL (ref 0.00–0.07)
Basophils Absolute: 0 10*3/uL (ref 0.0–0.1)
Basophils Relative: 0 %
Eosinophils Absolute: 0.4 10*3/uL (ref 0.0–0.5)
Eosinophils Relative: 4 %
HCT: 32.3 % — ABNORMAL LOW (ref 36.0–46.0)
Hemoglobin: 11 g/dL — ABNORMAL LOW (ref 12.0–15.0)
Immature Granulocytes: 0 %
Lymphocytes Relative: 23 %
Lymphs Abs: 2.2 10*3/uL (ref 0.7–4.0)
MCH: 37.3 pg — ABNORMAL HIGH (ref 26.0–34.0)
MCHC: 34.1 g/dL (ref 30.0–36.0)
MCV: 109.5 fL — ABNORMAL HIGH (ref 80.0–100.0)
Monocytes Absolute: 0.9 10*3/uL (ref 0.1–1.0)
Monocytes Relative: 9 %
Neutro Abs: 6.3 10*3/uL (ref 1.7–7.7)
Neutrophils Relative %: 64 %
Platelet Count: 291 10*3/uL (ref 150–400)
RBC: 2.95 MIL/uL — ABNORMAL LOW (ref 3.87–5.11)
RDW: 13.6 % (ref 11.5–15.5)
WBC Count: 9.8 10*3/uL (ref 4.0–10.5)
nRBC: 0 % (ref 0.0–0.2)

## 2020-12-19 LAB — CMP (CANCER CENTER ONLY)
ALT: 6 U/L (ref 0–44)
AST: 13 U/L — ABNORMAL LOW (ref 15–41)
Albumin: 3.6 g/dL (ref 3.5–5.0)
Alkaline Phosphatase: 77 U/L (ref 38–126)
Anion gap: 8 (ref 5–15)
BUN: 16 mg/dL (ref 8–23)
CO2: 26 mmol/L (ref 22–32)
Calcium: 9.1 mg/dL (ref 8.9–10.3)
Chloride: 103 mmol/L (ref 98–111)
Creatinine: 0.88 mg/dL (ref 0.44–1.00)
GFR, Estimated: >60 mL/min (ref >60)
Glucose, Bld: 121 mg/dL — ABNORMAL HIGH (ref 70–99)
Potassium: 3.4 mmol/L — ABNORMAL LOW (ref 3.5–5.1)
Sodium: 137 mmol/L (ref 135–145)
Total Bilirubin: 0.4 mg/dL (ref 0.3–1.2)
Total Protein: 7.8 g/dL (ref 6.5–8.1)

## 2020-12-19 LAB — IRON AND TIBC
Iron: 54 ug/dL (ref 41–142)
Saturation Ratios: 31 % (ref 21–57)
TIBC: 173 ug/dL — ABNORMAL LOW (ref 236–444)
UIBC: 119 ug/dL — ABNORMAL LOW (ref 120–384)

## 2020-12-19 LAB — FERRITIN: Ferritin: 444 ng/mL — ABNORMAL HIGH (ref 11–307)

## 2020-12-19 NOTE — Telephone Encounter (Signed)
Per 12/19/20 los - called and lvm of upcoming appointments - requested call back to confirm - mailed calendar

## 2020-12-19 NOTE — Progress Notes (Signed)
Hematology and Oncology Follow Up Visit  Mallory Murphy [id number] [birth year] 74 y.o. 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 She is doing well and has no complaints at this time.  She has occasional fatigue and dizziness she state due to low blood sugar when she doesn't "eat right".  She has a good appetite and is staying well hydrated. Her weight is stable at 136 lbs.  No fever, chills, n/v, cough, rash, SOB, chest pain, palpitations, abdominal pain or changes in bowel or bladder habits.  No blood loss to report. No abnormal bruising, no petechiae.  No swelling, numbness or tingling in her extremities.  She ambulates with her Rolator for added support.  No falls or syncope to report.   ECOG Performance Status: 1 - Symptomatic but completely ambulatory  Medications:  Allergies as of 12/19/2020       Reactions   Macrobid [nitrofurantoin Macrocrystal] Nausea Only        Medication List        Accurate as of December 19, 2020 10:52 AM. If you have any questions, ask your nurse or doctor.          acetaminophen 500 MG tablet Commonly known as: TYLENOL Take 500-1,000 mg by mouth every 6 (six) hours as needed for mild pain, fever or headache.   calcitonin (salmon) 200 UNIT/ACT nasal spray Commonly known as: MIACALCIN/FORTICAL   escitalopram 10 MG tablet Commonly known as: LEXAPRO Take 10 mg by mouth daily.   losartan-hydrochlorothiazide 50-12.5 MG tablet Commonly known as: HYZAAR Take 1 tablet by mouth daily.   oxybutynin 5 MG tablet Commonly known as: DITROPAN Take 5 mg by mouth daily.   [place] ARIA IV Inject into the vein every 8 (eight) weeks. For RA [facility] Rhematology.   traMADol 50 MG tablet Commonly known as:  Ultram Take 1 tablet (50 mg total) by mouth every 6 (six) hours as needed.        Allergies:  Allergies  Allergen Reactions   Macrobid [Nitrofurantoin Macrocrystal] Nausea Only    Past Medical History, Surgical history, Social history, and Family History were reviewed and updated.  Review of Systems: All other 10 point review of systems is negative.   Physical Exam:  height is '5\' 1"'$  (1.549 m) and weight is 136 lb (61.7 kg). Her oral temperature is 98.3 F (36.8 C). Her blood pressure is 134/56 (abnormal) and her pulse is 95. Her respiration is 17 and oxygen saturation is 98%.   Wt Readings from Last 3 Encounters:  12/19/20 136 lb (61.7 kg)  11/08/20 136 lb 1.9 oz (61.7 kg)  09/12/20 134 lb (60.8 kg)    Ocular: Sclerae unicteric, pupils equal, round and reactive to light Ear-nose-throat: Oropharynx clear, dentition fair Lymphatic: No cervical or supraclavicular adenopathy Lungs no rales or rhonchi, good excursion bilaterally Heart regular rate and rhythm, no murmur appreciated Abd soft, nontender, positive bowel sounds MSK no focal spinal tenderness, no joint edema Neuro: non-focal, well-oriented, appropriate affect Breasts: Deferred   Lab Results  Component Value Date   WBC 9.8 12/19/2020   HGB 11.0 (L) 12/19/2020   HCT 32.3 (L) 12/19/2020   MCV 109.5 (H) 12/19/2020   PLT 291 12/19/2020   Lab Results  Component Value Date   FERRITIN 538 (H) 11/08/2020   IRON 87 11/08/2020   TIBC 175 (L) 11/08/2020   UIBC 88 (L) 11/08/2020   IRONPCTSAT 50 11/08/2020   Lab Results  Component Value Date   RETICCTPCT 1.1 09/12/2020   RBC 2.95 (L) 12/19/2020   Lab Results  Component Value Date   KPAFRELGTCHN 76.3 (H) 09/12/2020   LAMBDASER 52.8 (H) 09/12/2020   KAPLAMBRATIO 1.45 09/12/2020   Lab Results  Component Value Date   IGGSERUM 2,143 (H) 09/12/2020   IGA 398 09/12/2020   IGMSERUM 103 09/12/2020   Lab Results  Component Value Date   TOTALPROTELP 7.1  09/12/2020   ALBUMINELP 3.1 09/12/2020   A1GS 0.3 09/12/2020   A2GS 0.7 09/12/2020   BETS 0.9 09/12/2020   GAMS 2.1 (H) 09/12/2020   MSPIKE 1.0 (H) 09/12/2020   SPEI Comment 09/12/2020     Chemistry      Component Value Date/Time   NA 139 11/29/2020 1305   K 4.2 11/29/2020 1305   CL 104 11/29/2020 1305   CO2 27 11/29/2020 1305   BUN 17 11/29/2020 1305   CREATININE 1.00 11/29/2020 1305   CREATININE 1.19 (H) 05/07/2012 1553      Component Value Date/Time   CALCIUM 9.3 11/29/2020 1305   ALKPHOS 86 11/29/2020 1305   AST 13 (L) 11/29/2020 1305   ALT 5 11/29/2020 1305   BILITOT 0.5 11/29/2020 1305       Impression and Plan: Mallory Murphy is a very pleasant 75 yo caucasian female with multifactorial anemia as well as IgG kappa MGUS.  No ESA needed this visit, Hgb 11.0.  Iron studies are pending. We will replace if needed.  Lab and injection in 3 weeks and follow-up in 6 weeks.  She can contact our office with any questions or concerns.   Laverna Peace, NP 9/7/202210:52 AM

## 2021-01-08 ENCOUNTER — Other Ambulatory Visit: Payer: Self-pay | Admitting: Family

## 2021-01-08 DIAGNOSIS — D631 Anemia in chronic kidney disease: Secondary | ICD-10-CM

## 2021-01-08 DIAGNOSIS — D500 Iron deficiency anemia secondary to blood loss (chronic): Secondary | ICD-10-CM

## 2021-01-09 ENCOUNTER — Inpatient Hospital Stay: Payer: Medicare Other

## 2021-01-09 ENCOUNTER — Other Ambulatory Visit: Payer: Self-pay

## 2021-01-09 VITALS — BP 120/56 | HR 90 | Temp 98.3°F | Resp 18

## 2021-01-09 DIAGNOSIS — D500 Iron deficiency anemia secondary to blood loss (chronic): Secondary | ICD-10-CM

## 2021-01-09 DIAGNOSIS — D631 Anemia in chronic kidney disease: Secondary | ICD-10-CM

## 2021-01-09 DIAGNOSIS — N189 Chronic kidney disease, unspecified: Secondary | ICD-10-CM | POA: Diagnosis not present

## 2021-01-09 LAB — CBC WITH DIFFERENTIAL (CANCER CENTER ONLY)
Abs Immature Granulocytes: 0.02 10*3/uL (ref 0.00–0.07)
Basophils Absolute: 0 10*3/uL (ref 0.0–0.1)
Basophils Relative: 0 %
Eosinophils Absolute: 0.5 10*3/uL (ref 0.0–0.5)
Eosinophils Relative: 6 %
HCT: 31.6 % — ABNORMAL LOW (ref 36.0–46.0)
Hemoglobin: 10.8 g/dL — ABNORMAL LOW (ref 12.0–15.0)
Immature Granulocytes: 0 %
Lymphocytes Relative: 33 %
Lymphs Abs: 3.2 10*3/uL (ref 0.7–4.0)
MCH: 37.4 pg — ABNORMAL HIGH (ref 26.0–34.0)
MCHC: 34.2 g/dL (ref 30.0–36.0)
MCV: 109.3 fL — ABNORMAL HIGH (ref 80.0–100.0)
Monocytes Absolute: 0.8 10*3/uL (ref 0.1–1.0)
Monocytes Relative: 9 %
Neutro Abs: 5.2 10*3/uL (ref 1.7–7.7)
Neutrophils Relative %: 52 %
Platelet Count: 331 10*3/uL (ref 150–400)
RBC: 2.89 MIL/uL — ABNORMAL LOW (ref 3.87–5.11)
RDW: 14.6 % (ref 11.5–15.5)
WBC Count: 9.8 10*3/uL (ref 4.0–10.5)
nRBC: 0 % (ref 0.0–0.2)

## 2021-01-09 LAB — CMP (CANCER CENTER ONLY)
ALT: 10 U/L (ref 0–44)
AST: 19 U/L (ref 15–41)
Albumin: 3.7 g/dL (ref 3.5–5.0)
Alkaline Phosphatase: 77 U/L (ref 38–126)
Anion gap: 9 (ref 5–15)
BUN: 17 mg/dL (ref 8–23)
CO2: 29 mmol/L (ref 22–32)
Calcium: 9.2 mg/dL (ref 8.9–10.3)
Chloride: 99 mmol/L (ref 98–111)
Creatinine: 0.99 mg/dL (ref 0.44–1.00)
GFR, Estimated: 60 mL/min — ABNORMAL LOW (ref >60)
Glucose, Bld: 99 mg/dL (ref 70–99)
Potassium: 4.2 mmol/L (ref 3.5–5.1)
Sodium: 137 mmol/L (ref 135–145)
Total Bilirubin: 0.4 mg/dL (ref 0.3–1.2)
Total Protein: 8.2 g/dL — ABNORMAL HIGH (ref 6.5–8.1)

## 2021-01-09 MED ORDER — DARBEPOETIN ALFA 300 MCG/0.6ML IJ SOSY
300.0000 ug | PREFILLED_SYRINGE | Freq: Once | INTRAMUSCULAR | Status: AC
  Administered 2021-01-09: 300 ug via SUBCUTANEOUS
  Filled 2021-01-09: qty 0.6

## 2021-01-09 NOTE — Patient Instructions (Signed)
Darbepoetin Alfa injection What is this medication? DARBEPOETIN ALFA (dar be POE e tin AL fa) helps your body make more red blood cells. It is used to treat anemia caused by chronic kidney failure and chemotherapy. This medicine may be used for other purposes; ask your health care provider or pharmacist if you have questions. COMMON BRAND NAME(S): Aranesp What should I tell my care team before I take this medication? They need to know if you have any of these conditions: blood clotting disorders or history of blood clots cancer patient not on chemotherapy cystic fibrosis heart disease, such as angina, heart failure, or a history of a heart attack hemoglobin level of 12 g/dL or greater high blood pressure low levels of folate, iron, or vitamin B12 seizures an unusual or allergic reaction to darbepoetin, erythropoietin, albumin, hamster proteins, latex, other medicines, foods, dyes, or preservatives pregnant or trying to get pregnant breast-feeding How should I use this medication? This medicine is for injection into a vein or under the skin. It is usually given by a health care professional in a hospital or clinic setting. If you get this medicine at home, you will be taught how to prepare and give this medicine. Use exactly as directed. Take your medicine at regular intervals. Do not take your medicine more often than directed. It is important that you put your used needles and syringes in a special sharps container. Do not put them in a trash can. If you do not have a sharps container, call your pharmacist or healthcare provider to get one. A special MedGuide will be given to you by the pharmacist with each prescription and refill. Be sure to read this information carefully each time. Talk to your pediatrician regarding the use of this medicine in children. While this medicine may be used in children as young as 1 month of age for selected conditions, precautions do apply. Overdosage: If you  think you have taken too much of this medicine contact a poison control center or emergency room at once. NOTE: This medicine is only for you. Do not share this medicine with others. What if I miss a dose? If you miss a dose, take it as soon as you can. If it is almost time for your next dose, take only that dose. Do not take double or extra doses. What may interact with this medication? Do not take this medicine with any of the following medications: epoetin alfa This list may not describe all possible interactions. Give your health care provider a list of all the medicines, herbs, non-prescription drugs, or dietary supplements you use. Also tell them if you smoke, drink alcohol, or use illegal drugs. Some items may interact with your medicine. What should I watch for while using this medication? Your condition will be monitored carefully while you are receiving this medicine. You may need blood work done while you are taking this medicine. This medicine may cause a decrease in vitamin B6. You should make sure that you get enough vitamin B6 while you are taking this medicine. Discuss the foods you eat and the vitamins you take with your health care professional. What side effects may I notice from receiving this medication? Side effects that you should report to your doctor or health care professional as soon as possible: allergic reactions like skin rash, itching or hives, swelling of the face, lips, or tongue breathing problems changes in vision chest pain confusion, trouble speaking or understanding feeling faint or lightheaded, falls high blood pressure   muscle aches or pains pain, swelling, warmth in the leg rapid weight gain severe headaches sudden numbness or weakness of the face, arm or leg trouble walking, dizziness, loss of balance or coordination seizures (convulsions) swelling of the ankles, feet, hands unusually weak or tired Side effects that usually do not require medical  attention (report to your doctor or health care professional if they continue or are bothersome): diarrhea fever, chills (flu-like symptoms) headaches nausea, vomiting redness, stinging, or swelling at site where injected This list may not describe all possible side effects. Call your doctor for medical advice about side effects. You may report side effects to FDA at 1-800-FDA-1088. Where should I keep my medication? Keep out of the reach of children. Store in a refrigerator between 2 and 8 degrees C (36 and 46 degrees F). Do not freeze. Do not shake. Throw away any unused portion if using a single-dose vial. Throw away any unused medicine after the expiration date. NOTE: This sheet is a summary. It may not cover all possible information. If you have questions about this medicine, talk to your doctor, pharmacist, or health care provider.  2022 Elsevier/Gold Standard (2017-04-15 16:44:20)  

## 2021-01-30 ENCOUNTER — Inpatient Hospital Stay (HOSPITAL_BASED_OUTPATIENT_CLINIC_OR_DEPARTMENT_OTHER): Payer: Medicare Other | Admitting: Family

## 2021-01-30 ENCOUNTER — Encounter: Payer: Self-pay | Admitting: Family

## 2021-01-30 ENCOUNTER — Inpatient Hospital Stay: Payer: Medicare Other

## 2021-01-30 ENCOUNTER — Inpatient Hospital Stay: Payer: Medicare Other | Attending: Hematology & Oncology

## 2021-01-30 ENCOUNTER — Other Ambulatory Visit: Payer: Self-pay

## 2021-01-30 VITALS — BP 109/51 | HR 106 | Temp 98.7°F | Resp 18 | Wt 137.0 lb

## 2021-01-30 DIAGNOSIS — D509 Iron deficiency anemia, unspecified: Secondary | ICD-10-CM

## 2021-01-30 DIAGNOSIS — D472 Monoclonal gammopathy: Secondary | ICD-10-CM

## 2021-01-30 DIAGNOSIS — M069 Rheumatoid arthritis, unspecified: Secondary | ICD-10-CM | POA: Insufficient documentation

## 2021-01-30 DIAGNOSIS — D631 Anemia in chronic kidney disease: Secondary | ICD-10-CM | POA: Insufficient documentation

## 2021-01-30 DIAGNOSIS — E538 Deficiency of other specified B group vitamins: Secondary | ICD-10-CM

## 2021-01-30 DIAGNOSIS — N189 Chronic kidney disease, unspecified: Secondary | ICD-10-CM | POA: Diagnosis present

## 2021-01-30 DIAGNOSIS — D500 Iron deficiency anemia secondary to blood loss (chronic): Secondary | ICD-10-CM

## 2021-01-30 LAB — CBC WITH DIFFERENTIAL (CANCER CENTER ONLY)
Abs Immature Granulocytes: 0.02 10*3/uL (ref 0.00–0.07)
Basophils Absolute: 0 10*3/uL (ref 0.0–0.1)
Basophils Relative: 0 %
Eosinophils Absolute: 0.3 10*3/uL (ref 0.0–0.5)
Eosinophils Relative: 4 %
HCT: 32.7 % — ABNORMAL LOW (ref 36.0–46.0)
Hemoglobin: 11 g/dL — ABNORMAL LOW (ref 12.0–15.0)
Immature Granulocytes: 0 %
Lymphocytes Relative: 25 %
Lymphs Abs: 2.1 10*3/uL (ref 0.7–4.0)
MCH: 38.1 pg — ABNORMAL HIGH (ref 26.0–34.0)
MCHC: 33.6 g/dL (ref 30.0–36.0)
MCV: 113.1 fL — ABNORMAL HIGH (ref 80.0–100.0)
Monocytes Absolute: 0.7 10*3/uL (ref 0.1–1.0)
Monocytes Relative: 8 %
Neutro Abs: 5.3 10*3/uL (ref 1.7–7.7)
Neutrophils Relative %: 63 %
Platelet Count: 313 10*3/uL (ref 150–400)
RBC: 2.89 MIL/uL — ABNORMAL LOW (ref 3.87–5.11)
RDW: 14.6 % (ref 11.5–15.5)
WBC Count: 8.4 10*3/uL (ref 4.0–10.5)
nRBC: 0 % (ref 0.0–0.2)

## 2021-01-30 LAB — CMP (CANCER CENTER ONLY)
ALT: 6 U/L (ref 0–44)
AST: 13 U/L — ABNORMAL LOW (ref 15–41)
Albumin: 3.7 g/dL (ref 3.5–5.0)
Alkaline Phosphatase: 79 U/L (ref 38–126)
Anion gap: 7 (ref 5–15)
BUN: 17 mg/dL (ref 8–23)
CO2: 27 mmol/L (ref 22–32)
Calcium: 9.4 mg/dL (ref 8.9–10.3)
Chloride: 103 mmol/L (ref 98–111)
Creatinine: 0.93 mg/dL (ref 0.44–1.00)
GFR, Estimated: >60 mL/min (ref >60)
Glucose, Bld: 112 mg/dL — ABNORMAL HIGH (ref 70–99)
Potassium: 3.6 mmol/L (ref 3.5–5.1)
Sodium: 137 mmol/L (ref 135–145)
Total Bilirubin: 0.5 mg/dL (ref 0.3–1.2)
Total Protein: 8.2 g/dL — ABNORMAL HIGH (ref 6.5–8.1)

## 2021-01-30 NOTE — Progress Notes (Signed)
Hematology and Oncology Follow Up Visit  Mallory Murphy [id number] [birth year] 75 y.o. 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 She is doing well and has no complaints at this time.  She has not noted any blood loss. No abnormal bruising, no petechiae.  No fever, chills, n/v, cough, rash, dizziness, SOB, chest pain, palpitations, abdominal pain or changes in bowel or bladder habits.  No swelling, numbness or tingling in her extremities st this time.  No falls or syncope. She ambulates with a Rolator for added support.  She has maintained a good appetite and is staying well hydrated. Her weight is stable at 137 lbs.   ECOG Performance Status: 1 - Symptomatic but completely ambulatory  Medications:  Allergies as of 01/30/2021       Reactions   Macrobid [nitrofurantoin Macrocrystal] Nausea Only        Medication List        Accurate as of January 30, 2021 11:22 AM. If you have any questions, ask your nurse or doctor.          acetaminophen 500 MG tablet Commonly known as: TYLENOL Take 500-1,000 mg by mouth every 6 (six) hours as needed for mild pain, fever or headache.   calcitonin (salmon) 200 UNIT/ACT nasal spray Commonly known as: MIACALCIN/FORTICAL   diphenhydrAMINE 25 MG tablet Commonly known as: BENADRYL Take 25-50 mg by mouth at bedtime.   escitalopram 10 MG tablet Commonly known as: LEXAPRO Take 10 mg by mouth daily.   losartan 100 MG tablet Commonly known as: COZAAR Take 100 mg by mouth daily.   losartan-hydrochlorothiazide 50-12.5 MG tablet Commonly known as: HYZAAR Take 1 tablet by mouth daily.   oxybutynin 5 MG tablet Commonly known as: DITROPAN Take 5 mg by mouth daily.   [place] ARIA IV Inject into the  vein every 8 (eight) weeks. For RA [facility] Rhemat[facility].   traMADol 50 MG tablet Commonly known as: Ultram Take 1 tablet (50 mg total) by mouth every 6 (six) hours as needed.        Allergies:  Allergies  Allergen Reactions   Macrobid [Nitrofurantoin Macrocrystal] Nausea Only    Past Medical History, Surgical history, Social history, and Family History were reviewed and updated.  Review of Systems: All other 10 point review of systems is negative.   Physical Exam:  weight is 137 lb (62.1 kg). Her oral temperature is 98.7 F (37.1 C). Her blood pressure is 109/51 (abnormal) and her pulse is 106 (abnormal). Her respiration is 18 and oxygen saturation is 100%.   Wt Readings from Last 3 Encounters:  01/30/21 137 lb (62.1 kg)  12/19/20 136 lb (61.7 kg)  11/08/20 136 lb 1.9 oz (61.7 kg)    Ocular: Sclerae unicteric, pupils equal, round and reactive to light Ear-nose-throat: Oropharynx clear, dentition fair Lymphatic: No cervical or supraclavicular adenopathy Lungs no rales or rhonchi, good excursion bilaterally Heart regular rate and rhythm, no murmur appreciated Abd soft, nontender, positive bowel sounds MSK no focal spinal tenderness, no joint edema Neuro: non-focal, well-oriented, appropriate affect Breasts: Deferred  Lab Results  Component Value Date   WBC 8.4 01/30/2021   HGB 11.0 (L) 01/30/2021   HCT 32.7 (L) 01/30/2021   MCV 113.1 (  H) 01/30/2021   PLT 313 01/30/2021   Lab Results  Component Value Date   FERRITIN 444 (H) 12/19/2020   IRON 54 12/19/2020   TIBC 173 (L) 12/19/2020   UIBC 119 (L) 12/19/2020   IRONPCTSAT 31 12/19/2020   Lab Results  Component Value Date   RETICCTPCT 1.1 09/12/2020   RBC 2.89 (L) 01/30/2021   Lab Results  Component Value Date   KPAFRELGTCHN 76.3 (H) 09/12/2020   LAMBDASER 52.8 (H) 09/12/2020   KAPLAMBRATIO 1.45 09/12/2020   Lab Results  Component Value Date   IGGSERUM 2,143 (H) 09/12/2020   IGA 398 09/12/2020    IGMSERUM 103 09/12/2020   Lab Results  Component Value Date   TOTALPROTELP 7.1 09/12/2020   ALBUMINELP 3.1 09/12/2020   A1GS 0.3 09/12/2020   A2GS 0.7 09/12/2020   BETS 0.9 09/12/2020   GAMS 2.1 (H) 09/12/2020   MSPIKE 1.0 (H) 09/12/2020   SPEI Comment 09/12/2020     Chemistry      Component Value Date/Time   NA 137 01/09/2021 1024   K 4.2 01/09/2021 1024   CL 99 01/09/2021 1024   CO2 29 01/09/2021 1024   BUN 17 01/09/2021 1024   CREATININE 0.99 01/09/2021 1024   CREATININE 1.19 (H) 05/07/2012 1553      Component Value Date/Time   CALCIUM 9.2 01/09/2021 1024   ALKPHOS 77 01/09/2021 1024   AST 19 01/09/2021 1024   ALT 10 01/09/2021 1024   BILITOT 0.4 01/09/2021 1024       Impression and Plan: Mallory Murphy is a very pleasant 75 yo caucasian female with multifactorial anemia as well as IgG kappa MGUS.  Protein studies are pending.  No ESA needed this visit, Hgb 11.0.  Iron studies are pending.  Lab and injection every 3 weeks. Follow-up in 6 weeks.  She can contact our office with any questions or concerns.   Lottie Dawson, NP 10/19/202211:22 AM

## 2021-01-31 LAB — IGG, IGA, IGM
IgA: 411 mg/dL (ref 64–422)
IgG (Immunoglobin G), Serum: 2556 mg/dL — ABNORMAL HIGH (ref 586–1602)
IgM (Immunoglobulin M), Srm: 112 mg/dL (ref 26–217)

## 2021-01-31 LAB — IRON AND TIBC
Iron: 41 ug/dL (ref 41–142)
Saturation Ratios: 25 % (ref 21–57)
TIBC: 168 ug/dL — ABNORMAL LOW (ref 236–444)
UIBC: 126 ug/dL (ref 120–384)

## 2021-01-31 LAB — FERRITIN: Ferritin: 471 ng/mL — ABNORMAL HIGH (ref 11–307)

## 2021-01-31 LAB — KAPPA/LAMBDA LIGHT CHAINS
Kappa free light chain: 104.9 mg/L — ABNORMAL HIGH (ref 3.3–19.4)
Kappa, lambda light chain ratio: 1.94 — ABNORMAL HIGH (ref 0.26–1.65)
Lambda free light chains: 54.1 mg/L — ABNORMAL HIGH (ref 5.7–26.3)

## 2021-02-01 LAB — PROTEIN ELECTROPHORESIS, SERUM
A/G Ratio: 0.9 (ref 0.7–1.7)
Albumin ELP: 3.7 g/dL (ref 2.9–4.4)
Alpha-1-Globulin: 0.3 g/dL (ref 0.0–0.4)
Alpha-2-Globulin: 0.7 g/dL (ref 0.4–1.0)
Beta Globulin: 0.9 g/dL (ref 0.7–1.3)
Gamma Globulin: 2.3 g/dL — ABNORMAL HIGH (ref 0.4–1.8)
Globulin, Total: 4.3 g/dL — ABNORMAL HIGH (ref 2.2–3.9)
M-Spike, %: 1.1 g/dL — ABNORMAL HIGH
Total Protein ELP: 8 g/dL (ref 6.0–8.5)

## 2021-02-13 ENCOUNTER — Other Ambulatory Visit: Payer: Self-pay | Admitting: Family

## 2021-02-13 DIAGNOSIS — D472 Monoclonal gammopathy: Secondary | ICD-10-CM

## 2021-02-20 ENCOUNTER — Inpatient Hospital Stay: Payer: Medicare Other

## 2021-02-20 ENCOUNTER — Inpatient Hospital Stay: Payer: Medicare Other | Attending: Hematology & Oncology

## 2021-02-20 ENCOUNTER — Other Ambulatory Visit: Payer: Self-pay

## 2021-02-20 VITALS — BP 114/45 | HR 84 | Temp 98.0°F | Resp 16

## 2021-02-20 DIAGNOSIS — D631 Anemia in chronic kidney disease: Secondary | ICD-10-CM | POA: Insufficient documentation

## 2021-02-20 DIAGNOSIS — D500 Iron deficiency anemia secondary to blood loss (chronic): Secondary | ICD-10-CM

## 2021-02-20 DIAGNOSIS — E538 Deficiency of other specified B group vitamins: Secondary | ICD-10-CM | POA: Insufficient documentation

## 2021-02-20 DIAGNOSIS — N183 Chronic kidney disease, stage 3 unspecified: Secondary | ICD-10-CM | POA: Diagnosis not present

## 2021-02-20 LAB — CMP (CANCER CENTER ONLY)
ALT: 6 U/L (ref 0–44)
AST: 13 U/L — ABNORMAL LOW (ref 15–41)
Albumin: 3.6 g/dL (ref 3.5–5.0)
Alkaline Phosphatase: 64 U/L (ref 38–126)
Anion gap: 9 (ref 5–15)
BUN: 25 mg/dL — ABNORMAL HIGH (ref 8–23)
CO2: 27 mmol/L (ref 22–32)
Calcium: 9.2 mg/dL (ref 8.9–10.3)
Chloride: 99 mmol/L (ref 98–111)
Creatinine: 1.08 mg/dL — ABNORMAL HIGH (ref 0.44–1.00)
GFR, Estimated: 54 mL/min — ABNORMAL LOW
Glucose, Bld: 140 mg/dL — ABNORMAL HIGH (ref 70–99)
Potassium: 3.9 mmol/L (ref 3.5–5.1)
Sodium: 135 mmol/L (ref 135–145)
Total Bilirubin: 0.5 mg/dL (ref 0.3–1.2)
Total Protein: 7.9 g/dL (ref 6.5–8.1)

## 2021-02-20 LAB — CBC WITH DIFFERENTIAL (CANCER CENTER ONLY)
Abs Immature Granulocytes: 0.02 10*3/uL (ref 0.00–0.07)
Basophils Absolute: 0 10*3/uL (ref 0.0–0.1)
Basophils Relative: 0 %
Eosinophils Absolute: 0.4 10*3/uL (ref 0.0–0.5)
Eosinophils Relative: 4 %
HCT: 30.3 % — ABNORMAL LOW (ref 36.0–46.0)
Hemoglobin: 10.1 g/dL — ABNORMAL LOW (ref 12.0–15.0)
Immature Granulocytes: 0 %
Lymphocytes Relative: 25 %
Lymphs Abs: 2.2 10*3/uL (ref 0.7–4.0)
MCH: 37.8 pg — ABNORMAL HIGH (ref 26.0–34.0)
MCHC: 33.3 g/dL (ref 30.0–36.0)
MCV: 113.5 fL — ABNORMAL HIGH (ref 80.0–100.0)
Monocytes Absolute: 0.9 10*3/uL (ref 0.1–1.0)
Monocytes Relative: 10 %
Neutro Abs: 5.2 10*3/uL (ref 1.7–7.7)
Neutrophils Relative %: 61 %
Platelet Count: 315 10*3/uL (ref 150–400)
RBC: 2.67 MIL/uL — ABNORMAL LOW (ref 3.87–5.11)
RDW: 13.9 % (ref 11.5–15.5)
WBC Count: 8.7 10*3/uL (ref 4.0–10.5)
nRBC: 0 % (ref 0.0–0.2)

## 2021-02-20 LAB — VITAMIN B12: Vitamin B-12: 60 pg/mL — ABNORMAL LOW (ref 180–914)

## 2021-02-20 LAB — FOLATE: Folate: 12 ng/mL

## 2021-02-20 MED ORDER — DARBEPOETIN ALFA 300 MCG/0.6ML IJ SOSY
300.0000 ug | PREFILLED_SYRINGE | Freq: Once | INTRAMUSCULAR | Status: AC
  Administered 2021-02-20: 300 ug via SUBCUTANEOUS
  Filled 2021-02-20: qty 0.6

## 2021-02-20 NOTE — Patient Instructions (Signed)
Darbepoetin Alfa injection ?What is this medication? ?DARBEPOETIN ALFA (dar be POE e tin  AL fa) helps your body make more red blood cells. It is used to treat anemia caused by chronic kidney failure and chemotherapy. ?This medicine may be used for other purposes; ask your health care provider or pharmacist if you have questions. ?COMMON BRAND NAME(S): Aranesp ?What should I tell my care team before I take this medication? ?They need to know if you have any of these conditions: ?blood clotting disorders or history of blood clots ?cancer patient not on chemotherapy ?cystic fibrosis ?heart disease, such as angina, heart failure, or a history of a heart attack ?hemoglobin level of 12 g/dL or greater ?high blood pressure ?low levels of folate, iron, or vitamin B12 ?seizures ?an unusual or allergic reaction to darbepoetin, erythropoietin, albumin, hamster proteins, latex, other medicines, foods, dyes, or preservatives ?pregnant or trying to get pregnant ?breast-feeding ?How should I use this medication? ?This medicine is for injection into a vein or under the skin. It is usually given by a health care professional in a hospital or clinic setting. ?If you get this medicine at home, you will be taught how to prepare and give this medicine. Use exactly as directed. Take your medicine at regular intervals. Do not take your medicine more often than directed. ?It is important that you put your used needles and syringes in a special sharps container. Do not put them in a trash can. If you do not have a sharps container, call your pharmacist or healthcare provider to get one. ?A special MedGuide will be given to you by the pharmacist with each prescription and refill. Be sure to read this information carefully each time. ?Talk to your pediatrician regarding the use of this medicine in children. While this medicine may be used in children as young as 1 month of age for selected conditions, precautions do apply. ?Overdosage: If  you think you have taken too much of this medicine contact a poison control center or emergency room at once. ?NOTE: This medicine is only for you. Do not share this medicine with others. ?What if I miss a dose? ?If you miss a dose, take it as soon as you can. If it is almost time for your next dose, take only that dose. Do not take double or extra doses. ?What may interact with this medication? ?Do not take this medicine with any of the following medications: ?epoetin alfa ?This list may not describe all possible interactions. Give your health care provider a list of all the medicines, herbs, non-prescription drugs, or dietary supplements you use. Also tell them if you smoke, drink alcohol, or use illegal drugs. Some items may interact with your medicine. ?What should I watch for while using this medication? ?Your condition will be monitored carefully while you are receiving this medicine. ?You may need blood work done while you are taking this medicine. ?This medicine may cause a decrease in vitamin B6. You should make sure that you get enough vitamin B6 while you are taking this medicine. Discuss the foods you eat and the vitamins you take with your health care professional. ?What side effects may I notice from receiving this medication? ?Side effects that you should report to your doctor or health care professional as soon as possible: ?allergic reactions like skin rash, itching or hives, swelling of the face, lips, or tongue ?breathing problems ?changes in vision ?chest pain ?confusion, trouble speaking or understanding ?feeling faint or lightheaded, falls ?high blood   pressure ?muscle aches or pains ?pain, swelling, warmth in the leg ?rapid weight gain ?severe headaches ?sudden numbness or weakness of the face, arm or leg ?trouble walking, dizziness, loss of balance or coordination ?seizures (convulsions) ?swelling of the ankles, feet, hands ?unusually weak or tired ?Side effects that usually do not require  medical attention (report to your doctor or health care professional if they continue or are bothersome): ?diarrhea ?fever, chills (flu-like symptoms) ?headaches ?nausea, vomiting ?redness, stinging, or swelling at site where injected ?This list may not describe all possible side effects. Call your doctor for medical advice about side effects. You may report side effects to FDA at 1-800-FDA-1088. ?Where should I keep my medication? ?Keep out of the reach of children. ?Store in a refrigerator between 2 and 8 degrees C (36 and 46 degrees F). Do not freeze. Do not shake. Throw away any unused portion if using a single-dose vial. Throw away any unused medicine after the expiration date. ?NOTE: This sheet is a summary. It may not cover all possible information. If you have questions about this medicine, talk to your doctor, pharmacist, or health care provider. ?? 2022 Elsevier/Gold Standard (2017-04-20 00:00:00) ? ?

## 2021-02-22 ENCOUNTER — Other Ambulatory Visit: Payer: Self-pay | Admitting: Family

## 2021-02-22 DIAGNOSIS — D500 Iron deficiency anemia secondary to blood loss (chronic): Secondary | ICD-10-CM

## 2021-02-22 DIAGNOSIS — E538 Deficiency of other specified B group vitamins: Secondary | ICD-10-CM

## 2021-02-26 ENCOUNTER — Telehealth: Payer: Self-pay | Admitting: *Deleted

## 2021-02-26 NOTE — Telephone Encounter (Signed)
Per scheduling message Judson Roch - called and lvm to schedule weekly B-12 injections.

## 2021-02-27 ENCOUNTER — Telehealth: Payer: Self-pay | Admitting: *Deleted

## 2021-02-27 NOTE — Telephone Encounter (Signed)
Per scheduling message sarah - B-12 injections

## 2021-02-28 ENCOUNTER — Other Ambulatory Visit: Payer: Self-pay

## 2021-02-28 ENCOUNTER — Inpatient Hospital Stay: Payer: Medicare Other

## 2021-02-28 VITALS — BP 116/55 | HR 87 | Temp 98.1°F | Resp 18

## 2021-02-28 DIAGNOSIS — N183 Chronic kidney disease, stage 3 unspecified: Secondary | ICD-10-CM | POA: Diagnosis not present

## 2021-02-28 DIAGNOSIS — D500 Iron deficiency anemia secondary to blood loss (chronic): Secondary | ICD-10-CM

## 2021-02-28 MED ORDER — CYANOCOBALAMIN 1000 MCG/ML IJ SOLN
1000.0000 ug | Freq: Once | INTRAMUSCULAR | Status: AC
  Administered 2021-02-28: 17:00:00 1000 ug via INTRAMUSCULAR
  Filled 2021-02-28: qty 1

## 2021-02-28 NOTE — Patient Instructions (Signed)
Vitamin B12 and Folate Test Why am I having this test? Vitamin O17 and folate (folic acid) are B vitamins needed to make red blood cells and keep your nervous system healthy. Vitamin B12 is in foods such as meats, eggs, dairy products, and fish. Folate is in fruits, beans, and leafy green vegetables. Some foods, such as whole grains, bread, and cereals have vitamin B12 added to them (are fortified). You may not have enough of these B vitamins (have a deficiency) if your diet lacks these vitamins. Low levels can also be caused by diseases or having had surgeries on your stomach or small intestine that interfere with your ability to absorb the vitamins from your food. You may have a vitamin B12 and folate test if: You have symptoms of vitamin B12 or folate deficiency, such as tiredness (fatigue), headache, confusion, poor balance, or tingling and numbness in your hands and feet. You are pregnant or breastfeeding. Women who are pregnant or breastfeeding need more folate and may need to take dietary supplements. Your red blood cell count is low (anemia). You are an older person and have mental confusion. You have a disease or condition that may lead to a deficiency of these B vitamins. What is being tested? This test measures the amount of vitamin B12 and folate in your blood. The tests for vitamin B12 and folate may be done together or separately. What kind of sample is taken? A blood sample is required for this test. It is usually collected by inserting a needle into a blood vessel. How do I prepare for this test? Follow instructions from your health care provider about eating and drinking before the test. Tell a health care provider about: All medicines you are taking, including vitamins, herbs, eye drops, creams, and over-the-counter medicines. Any medical conditions you have. Whether you are pregnant or may be pregnant. How often you drink alcohol. How are the results reported? Your test  results will be reported as values that identify the amount of vitamin B12 and folate in your blood. Your health care provider will compare your results to normal ranges that were established after testing a large group of people (reference ranges). Reference ranges may vary among labs and hospitals. For this test, common reference ranges are: Vitamin B12: 160-950 pg/mL or 118-701 pmol/L (SI units). Folate: 5-25 ng/mL or 11-57 nmol/L (SI units). What do the results mean? Results within the reference range are considered normal. Vitamin B12 or folate levels that are lower than the reference range may be caused by: Poor nutrition or eating a vegetarian or vegan diet that does not include any foods that come from animals. Having alcoholism. Having certain diseases that make it hard to absorb vitamin B12. These diseases include Crohn's disease, chronic pancreatitis, and cystic fibrosis. Taking certain medicines. Having had surgeries on your stomach or small intestine. High levels of vitamin B12 are rare, but they may happen if you have: Cancer. Liver disease. High levels of folate may happen if: You have anemia. You are vegetarian. You have had a recent blood transfusion. Talk with your health care provider about what your results mean. Questions to ask your health care provider Ask your health care provider, or the department that is doing the test: When will my results be ready? How will I get my results? What are my treatment options? What other tests do I need? What are my next steps? Summary Vitamin P10 and folate (folic acid) are both B vitamins that are needed to make  red blood cells and to keep your nervous system healthy. You may not have enough B vitamins in your body if you do not get enough in your diet or if you have a disease that makes it hard to absorb vitamin B12. This test measures the amount of vitamin B12 and folate in your blood. A blood sample is required for the  test. Talk with your health care provider about what your results mean. This information is not intended to replace advice given to you by your health care provider. Make sure you discuss any questions you have with your health care provider. Document Revised: 11/23/2020 Document Reviewed: 11/23/2020 Elsevier Patient Education  2022 [employer].

## 2021-03-11 ENCOUNTER — Inpatient Hospital Stay: Payer: Medicare Other

## 2021-03-11 ENCOUNTER — Other Ambulatory Visit: Payer: Self-pay

## 2021-03-11 VITALS — BP 135/52 | HR 102 | Temp 97.7°F | Resp 20

## 2021-03-11 DIAGNOSIS — D500 Iron deficiency anemia secondary to blood loss (chronic): Secondary | ICD-10-CM

## 2021-03-11 DIAGNOSIS — N183 Chronic kidney disease, stage 3 unspecified: Secondary | ICD-10-CM | POA: Diagnosis not present

## 2021-03-11 MED ORDER — CYANOCOBALAMIN 1000 MCG/ML IJ SOLN
1000.0000 ug | Freq: Once | INTRAMUSCULAR | Status: AC
  Administered 2021-03-11: 16:00:00 1000 ug via INTRAMUSCULAR
  Filled 2021-03-11: qty 1

## 2021-03-11 NOTE — Patient Instructions (Signed)
Vitamin B12 Deficiency ?Vitamin B12 deficiency occurs when the body does not have enough vitamin B12, which is an important vitamin. The body needs this vitamin: ?To make red blood cells. ?To make DNA. This is the genetic material inside cells. ?To help the nerves work properly so they can carry messages from the brain to the body. ?Vitamin B12 deficiency can cause various health problems, such as a low red blood cell count (anemia) or nerve damage. ?What are the causes? ?This condition may be caused by: ?Not eating enough foods that contain vitamin B12. ?Not having enough stomach acid and digestive fluids to properly absorb vitamin B12 from the food that you eat. ?Certain digestive system diseases that make it hard to absorb vitamin B12. These diseases include Crohn's disease, chronic pancreatitis, and cystic fibrosis. ?A condition in which the body does not make enough of a protein (intrinsic factor), resulting in too few red blood cells (pernicious anemia). ?Having a surgery in which part of the stomach or small intestine is removed. ?Taking certain medicines that make it hard for the body to absorb vitamin B12. These medicines include: ?Heartburn medicines (antacids and proton pump inhibitors). ?Certain antibiotic medicines. ?Some medicines that are used to treat diabetes, tuberculosis, gout, or high cholesterol. ?What increases the risk? ?The following factors may make you more likely to develop a B12 deficiency: ?Being older than age 50. ?Eating a vegetarian or vegan diet, especially while you are pregnant. ?Eating a poor diet while you are pregnant. ?Taking certain medicines. ?Having alcoholism. ?What are the signs or symptoms? ?In some cases, there are no symptoms of this condition. If the condition leads to anemia or nerve damage, various symptoms can occur, such as: ?Weakness. ?Fatigue. ?Loss of appetite. ?Weight loss. ?Numbness or tingling in your hands and feet. ?Redness and burning of the  tongue. ?Confusion or memory problems. ?Depression. ?Sensory problems, such as color blindness, ringing in the ears, or loss of taste. ?Diarrhea or constipation. ?Trouble walking. ?If anemia is severe, symptoms can include: ?Shortness of breath. ?Dizziness. ?Rapid heart rate (tachycardia). ?How is this diagnosed? ?This condition may be diagnosed with a blood test to measure the level of vitamin B12 in your blood. You may also have other tests, including: ?A group of tests that measure certain characteristics of blood cells (complete blood count, CBC). ?A blood test to measure intrinsic factor. ?A procedure where a thin tube with a camera on the end is used to look into your stomach or intestines (endoscopy). ?Other tests may be needed to discover the cause of B12 deficiency. ?How is this treated? ?Treatment for this condition depends on the cause. This condition may be treated by: ?Changing your eating and drinking habits, such as: ?Eating more foods that contain vitamin B12. ?Drinking less alcohol or no alcohol. ?Getting vitamin B12 injections. ?Taking vitamin B12 supplements. Your health care provider will tell you which dosage is best for you. ?Follow these instructions at home: ?Eating and drinking ? ?Eat lots of healthy foods that contain vitamin B12, including: ?Meats and poultry. This includes beef, pork, chicken, turkey, and organ meats, such as liver. ?Seafood. This includes clams, rainbow trout, salmon, tuna, and haddock. ?Eggs. ?Cereal and dairy products that are fortified. This means that vitamin B12 has been added to the food. Check the label on the package to see if the food is fortified. ?The items listed above may not be a complete list of recommended foods and beverages. Contact a dietitian for more information. ?General instructions ?Get any   injections that are prescribed by your health care provider. ?Take supplements only as told by your health care provider. Follow the directions carefully. ?Do  not drink alcohol if your health care provider tells you not to. In some cases, you may only be asked to limit alcohol use. ?Keep all follow-up visits as told by your health care provider. This is important. ?Contact a health care provider if: ?Your symptoms come back. ?Get help right away if you: ?Develop shortness of breath. ?Have a rapid heart rate. ?Have chest pain. ?Become dizzy or lose consciousness. ?Summary ?Vitamin B12 deficiency occurs when the body does not have enough vitamin B12. ?The main causes of vitamin B12 deficiency include dietary deficiency, digestive diseases, pernicious anemia, and having a surgery in which part of the stomach or small intestine is removed. ?In some cases, there are no symptoms of this condition. If the condition leads to anemia or nerve damage, various symptoms can occur, such as weakness, shortness of breath, and numbness. ?Treatment may include getting vitamin B12 injections or taking vitamin B12 supplements. Eat lots of healthy foods that contain vitamin B12. ?This information is not intended to replace advice given to you by your health care provider. Make sure you discuss any questions you have with your health care provider. ?Document Revised: 09/26/2020 Document Reviewed: 12/08/2017 ?Elsevier Patient Education ? 2022 Elsevier Inc[employer] ? ?

## 2021-03-13 ENCOUNTER — Inpatient Hospital Stay: Payer: Medicare Other | Admitting: Family

## 2021-03-13 ENCOUNTER — Inpatient Hospital Stay: Payer: Medicare Other

## 2021-03-13 ENCOUNTER — Other Ambulatory Visit: Payer: Self-pay

## 2021-03-13 DIAGNOSIS — D631 Anemia in chronic kidney disease: Secondary | ICD-10-CM

## 2021-03-13 DIAGNOSIS — N183 Chronic kidney disease, stage 3 unspecified: Secondary | ICD-10-CM | POA: Diagnosis not present

## 2021-03-13 DIAGNOSIS — D500 Iron deficiency anemia secondary to blood loss (chronic): Secondary | ICD-10-CM

## 2021-03-13 DIAGNOSIS — D472 Monoclonal gammopathy: Secondary | ICD-10-CM

## 2021-03-13 LAB — RETICULOCYTES
Immature Retic Fract: 11.4 % (ref 2.3–15.9)
RBC.: 3.22 MIL/uL — ABNORMAL LOW (ref 3.87–5.11)
Retic Count, Absolute: 47 10*3/uL (ref 19.0–186.0)
Retic Ct Pct: 1.5 % (ref 0.4–3.1)

## 2021-03-13 LAB — CMP (CANCER CENTER ONLY)
ALT: 7 U/L (ref 0–44)
AST: 16 U/L (ref 15–41)
Albumin: 3.5 g/dL (ref 3.5–5.0)
Alkaline Phosphatase: 72 U/L (ref 38–126)
Anion gap: 8 (ref 5–15)
BUN: 13 mg/dL (ref 8–23)
CO2: 27 mmol/L (ref 22–32)
Calcium: 9.4 mg/dL (ref 8.9–10.3)
Chloride: 104 mmol/L (ref 98–111)
Creatinine: 0.89 mg/dL (ref 0.44–1.00)
GFR, Estimated: >60 mL/min (ref >60)
Glucose, Bld: 146 mg/dL — ABNORMAL HIGH (ref 70–99)
Potassium: 4.1 mmol/L (ref 3.5–5.1)
Sodium: 139 mmol/L (ref 135–145)
Total Bilirubin: 0.4 mg/dL (ref 0.3–1.2)
Total Protein: 7.9 g/dL (ref 6.5–8.1)

## 2021-03-13 LAB — CBC WITH DIFFERENTIAL (CANCER CENTER ONLY)
Abs Immature Granulocytes: 0.04 10*3/uL (ref 0.00–0.07)
Basophils Absolute: 0 10*3/uL (ref 0.0–0.1)
Basophils Relative: 0 %
Eosinophils Absolute: 0.2 10*3/uL (ref 0.0–0.5)
Eosinophils Relative: 3 %
HCT: 34.8 % — ABNORMAL LOW (ref 36.0–46.0)
Hemoglobin: 11.4 g/dL — ABNORMAL LOW (ref 12.0–15.0)
Immature Granulocytes: 0 %
Lymphocytes Relative: 26 %
Lymphs Abs: 2.5 10*3/uL (ref 0.7–4.0)
MCH: 35.1 pg — ABNORMAL HIGH (ref 26.0–34.0)
MCHC: 32.8 g/dL (ref 30.0–36.0)
MCV: 107.1 fL — ABNORMAL HIGH (ref 80.0–100.0)
Monocytes Absolute: 1 10*3/uL (ref 0.1–1.0)
Monocytes Relative: 10 %
Neutro Abs: 5.8 10*3/uL (ref 1.7–7.7)
Neutrophils Relative %: 61 %
Platelet Count: 486 10*3/uL — ABNORMAL HIGH (ref 150–400)
RBC: 3.25 MIL/uL — ABNORMAL LOW (ref 3.87–5.11)
RDW: 14.5 % (ref 11.5–15.5)
WBC Count: 9.5 10*3/uL (ref 4.0–10.5)
nRBC: 0 % (ref 0.0–0.2)

## 2021-03-14 ENCOUNTER — Telehealth: Payer: Self-pay | Admitting: *Deleted

## 2021-03-14 LAB — IRON AND TIBC
Iron: 29 ug/dL — ABNORMAL LOW (ref 41–142)
Saturation Ratios: 19 % — ABNORMAL LOW (ref 21–57)
TIBC: 154 ug/dL — ABNORMAL LOW (ref 236–444)
UIBC: 125 ug/dL (ref 120–384)

## 2021-03-14 LAB — IGG, IGA, IGM
IgA: 425 mg/dL — ABNORMAL HIGH (ref 64–422)
IgG (Immunoglobin G), Serum: 2532 mg/dL — ABNORMAL HIGH (ref 586–1602)
IgM (Immunoglobulin M), Srm: 127 mg/dL (ref 26–217)

## 2021-03-14 LAB — KAPPA/LAMBDA LIGHT CHAINS
Kappa free light chain: 84.8 mg/L — ABNORMAL HIGH (ref 3.3–19.4)
Kappa, lambda light chain ratio: 1.51 (ref 0.26–1.65)
Lambda free light chains: 56.3 mg/L — ABNORMAL HIGH (ref 5.7–26.3)

## 2021-03-14 LAB — FERRITIN: Ferritin: 391 ng/mL — ABNORMAL HIGH (ref 11–307)

## 2021-03-14 NOTE — Telephone Encounter (Signed)
Per secure chat from Judson Roch - called and lvm of upcoming appointments - requested call back to confirm - mailed updated calendar

## 2021-03-15 LAB — PROTEIN ELECTROPHORESIS, SERUM
A/G Ratio: 0.7 (ref 0.7–1.7)
Albumin ELP: 3.2 g/dL (ref 2.9–4.4)
Alpha-1-Globulin: 0.3 g/dL (ref 0.0–0.4)
Alpha-2-Globulin: 0.8 g/dL (ref 0.4–1.0)
Beta Globulin: 1 g/dL (ref 0.7–1.3)
Gamma Globulin: 2.3 g/dL — ABNORMAL HIGH (ref 0.4–1.8)
Globulin, Total: 4.5 g/dL — ABNORMAL HIGH (ref 2.2–3.9)
M-Spike, %: 1 g/dL — ABNORMAL HIGH
Total Protein ELP: 7.7 g/dL (ref 6.0–8.5)

## 2021-03-18 ENCOUNTER — Inpatient Hospital Stay: Payer: Medicare Other

## 2021-03-18 ENCOUNTER — Inpatient Hospital Stay: Payer: Medicare Other | Attending: Hematology & Oncology

## 2021-03-18 ENCOUNTER — Other Ambulatory Visit: Payer: Self-pay

## 2021-03-18 VITALS — BP 124/61 | HR 100 | Temp 98.5°F | Resp 20

## 2021-03-18 DIAGNOSIS — N183 Chronic kidney disease, stage 3 unspecified: Secondary | ICD-10-CM | POA: Insufficient documentation

## 2021-03-18 DIAGNOSIS — D631 Anemia in chronic kidney disease: Secondary | ICD-10-CM | POA: Insufficient documentation

## 2021-03-18 DIAGNOSIS — D500 Iron deficiency anemia secondary to blood loss (chronic): Secondary | ICD-10-CM

## 2021-03-18 MED ORDER — SODIUM CHLORIDE 0.9 % IV SOLN: 510.0000 mg | Freq: Once | INTRAVENOUS | Status: DC

## 2021-03-18 MED ORDER — CYANOCOBALAMIN 1000 MCG/ML IJ SOLN
1000.0000 ug | Freq: Once | INTRAMUSCULAR | Status: AC
  Administered 2021-03-18: 1000 ug via INTRAMUSCULAR
  Filled 2021-03-18: qty 1

## 2021-03-18 NOTE — Patient Instructions (Signed)

## 2021-03-25 ENCOUNTER — Other Ambulatory Visit: Payer: Self-pay

## 2021-03-25 ENCOUNTER — Inpatient Hospital Stay: Payer: Medicare Other

## 2021-03-25 VITALS — BP 137/65 | HR 89 | Temp 98.3°F | Resp 20

## 2021-03-25 DIAGNOSIS — D500 Iron deficiency anemia secondary to blood loss (chronic): Secondary | ICD-10-CM

## 2021-03-25 DIAGNOSIS — N183 Chronic kidney disease, stage 3 unspecified: Secondary | ICD-10-CM | POA: Diagnosis not present

## 2021-03-25 MED ORDER — CYANOCOBALAMIN 1000 MCG/ML IJ SOLN
1000.0000 ug | Freq: Once | INTRAMUSCULAR | Status: AC
  Administered 2021-03-25: 1000 ug via INTRAMUSCULAR

## 2021-03-25 NOTE — Patient Instructions (Signed)

## 2021-04-01 ENCOUNTER — Other Ambulatory Visit: Payer: Self-pay | Admitting: *Deleted

## 2021-04-01 DIAGNOSIS — D500 Iron deficiency anemia secondary to blood loss (chronic): Secondary | ICD-10-CM

## 2021-04-02 ENCOUNTER — Inpatient Hospital Stay: Payer: Medicare Other

## 2021-04-02 ENCOUNTER — Other Ambulatory Visit: Payer: Self-pay

## 2021-04-02 VITALS — BP 125/53 | HR 83 | Temp 98.7°F | Resp 19

## 2021-04-02 DIAGNOSIS — N183 Chronic kidney disease, stage 3 unspecified: Secondary | ICD-10-CM | POA: Diagnosis not present

## 2021-04-02 DIAGNOSIS — D500 Iron deficiency anemia secondary to blood loss (chronic): Secondary | ICD-10-CM

## 2021-04-02 LAB — CBC WITH DIFFERENTIAL (CANCER CENTER ONLY)
Band Neutrophils: 0 %
Basophils Absolute: 0 10*3/uL (ref 0.0–0.1)
Basophils Relative: 0 %
Eosinophils Absolute: 0.4 10*3/uL (ref 0.0–0.5)
Eosinophils Relative: 4 %
HCT: 32.4 % — ABNORMAL LOW (ref 36.0–46.0)
Hemoglobin: 10.8 g/dL — ABNORMAL LOW (ref 12.0–15.0)
Lymphocytes Relative: 26 %
Lymphs Abs: 2.3 10*3/uL (ref 0.7–4.0)
MCH: 33.5 pg (ref 26.0–34.0)
MCHC: 33.3 g/dL (ref 30.0–36.0)
MCV: 100.6 fL — ABNORMAL HIGH (ref 80.0–100.0)
Monocytes Absolute: 1 10*3/uL (ref 0.1–1.0)
Monocytes Relative: 12 %
Neutro Abs: 4.9 10*3/uL (ref 1.7–7.7)
Neutrophils Relative %: 57 %
Platelet Count: 297 10*3/uL (ref 150–400)
RBC: 3.22 MIL/uL — ABNORMAL LOW (ref 3.87–5.11)
RDW: 14.2 % (ref 11.5–15.5)
WBC Count: 8.6 10*3/uL (ref 4.0–10.5)
nRBC: 0 % (ref 0.0–0.2)

## 2021-04-02 LAB — COMPREHENSIVE METABOLIC PANEL
ALT: 7 U/L (ref 0–44)
AST: 18 U/L (ref 15–41)
Albumin: 3.5 g/dL (ref 3.5–5.0)
Alkaline Phosphatase: 70 U/L (ref 38–126)
Anion gap: 6 (ref 5–15)
BUN: 14 mg/dL (ref 8–23)
CO2: 27 mmol/L (ref 22–32)
Calcium: 9.2 mg/dL (ref 8.9–10.3)
Chloride: 106 mmol/L (ref 98–111)
Creatinine, Ser: 0.82 mg/dL (ref 0.44–1.00)
GFR, Estimated: >60 mL/min (ref >60)
Glucose, Bld: 134 mg/dL — ABNORMAL HIGH (ref 70–99)
Potassium: 3.9 mmol/L (ref 3.5–5.1)
Sodium: 139 mmol/L (ref 135–145)
Total Bilirubin: 0.4 mg/dL (ref 0.3–1.2)
Total Protein: 7.6 g/dL (ref 6.5–8.1)

## 2021-04-02 MED ORDER — DARBEPOETIN ALFA 300 MCG/0.6ML IJ SOSY
300.0000 ug | PREFILLED_SYRINGE | Freq: Once | INTRAMUSCULAR | Status: AC
  Administered 2021-04-02: 15:00:00 300 ug via SUBCUTANEOUS
  Filled 2021-04-02: qty 0.6

## 2021-04-02 NOTE — Patient Instructions (Signed)
Darbepoetin Alfa injection ?What is this medication? ?DARBEPOETIN ALFA (dar be POE e tin  AL fa) helps your body make more red blood cells. It is used to treat anemia caused by chronic kidney failure and chemotherapy. ?This medicine may be used for other purposes; ask your health care provider or pharmacist if you have questions. ?COMMON BRAND NAME(S): Aranesp ?What should I tell my care team before I take this medication? ?They need to know if you have any of these conditions: ?blood clotting disorders or history of blood clots ?cancer patient not on chemotherapy ?cystic fibrosis ?heart disease, such as angina, heart failure, or a history of a heart attack ?hemoglobin level of 12 g/dL or greater ?high blood pressure ?low levels of folate, iron, or vitamin B12 ?seizures ?an unusual or allergic reaction to darbepoetin, erythropoietin, albumin, hamster proteins, latex, other medicines, foods, dyes, or preservatives ?pregnant or trying to get pregnant ?breast-feeding ?How should I use this medication? ?This medicine is for injection into a vein or under the skin. It is usually given by a health care professional in a hospital or clinic setting. ?If you get this medicine at home, you will be taught how to prepare and give this medicine. Use exactly as directed. Take your medicine at regular intervals. Do not take your medicine more often than directed. ?It is important that you put your used needles and syringes in a special sharps container. Do not put them in a trash can. If you do not have a sharps container, call your pharmacist or healthcare provider to get one. ?A special MedGuide will be given to you by the pharmacist with each prescription and refill. Be sure to read this information carefully each time. ?Talk to your pediatrician regarding the use of this medicine in children. While this medicine may be used in children as young as 1 month of age for selected conditions, precautions do apply. ?Overdosage: If  you think you have taken too much of this medicine contact a poison control center or emergency room at once. ?NOTE: This medicine is only for you. Do not share this medicine with others. ?What if I miss a dose? ?If you miss a dose, take it as soon as you can. If it is almost time for your next dose, take only that dose. Do not take double or extra doses. ?What may interact with this medication? ?Do not take this medicine with any of the following medications: ?epoetin alfa ?This list may not describe all possible interactions. Give your health care provider a list of all the medicines, herbs, non-prescription drugs, or dietary supplements you use. Also tell them if you smoke, drink alcohol, or use illegal drugs. Some items may interact with your medicine. ?What should I watch for while using this medication? ?Your condition will be monitored carefully while you are receiving this medicine. ?You may need blood work done while you are taking this medicine. ?This medicine may cause a decrease in vitamin B6. You should make sure that you get enough vitamin B6 while you are taking this medicine. Discuss the foods you eat and the vitamins you take with your health care professional. ?What side effects may I notice from receiving this medication? ?Side effects that you should report to your doctor or health care professional as soon as possible: ?allergic reactions like skin rash, itching or hives, swelling of the face, lips, or tongue ?breathing problems ?changes in vision ?chest pain ?confusion, trouble speaking or understanding ?feeling faint or lightheaded, falls ?high blood   pressure ?muscle aches or pains ?pain, swelling, warmth in the leg ?rapid weight gain ?severe headaches ?sudden numbness or weakness of the face, arm or leg ?trouble walking, dizziness, loss of balance or coordination ?seizures (convulsions) ?swelling of the ankles, feet, hands ?unusually weak or tired ?Side effects that usually do not require  medical attention (report to your doctor or health care professional if they continue or are bothersome): ?diarrhea ?fever, chills (flu-like symptoms) ?headaches ?nausea, vomiting ?redness, stinging, or swelling at site where injected ?This list may not describe all possible side effects. Call your doctor for medical advice about side effects. You may report side effects to FDA at 1-800-FDA-1088. ?Where should I keep my medication? ?Keep out of the reach of children. ?Store in a refrigerator between 2 and 8 degrees C (36 and 46 degrees F). Do not freeze. Do not shake. Throw away any unused portion if using a single-dose vial. Throw away any unused medicine after the expiration date. ?NOTE: This sheet is a summary. It may not cover all possible information. If you have questions about this medicine, talk to your doctor, pharmacist, or health care provider. ?? 2022 Elsevier/Gold Standard (2017-04-20 00:00:00) ? ?

## 2021-04-03 ENCOUNTER — Other Ambulatory Visit: Payer: Medicare Other

## 2021-04-03 ENCOUNTER — Ambulatory Visit: Payer: Medicare Other

## 2021-04-11 ENCOUNTER — Inpatient Hospital Stay: Payer: Medicare Other

## 2021-04-18 ENCOUNTER — Other Ambulatory Visit: Payer: Self-pay

## 2021-04-18 ENCOUNTER — Inpatient Hospital Stay: Payer: Medicare Other | Attending: Hematology & Oncology

## 2021-04-18 VITALS — BP 163/66 | HR 80 | Temp 97.7°F | Resp 18

## 2021-04-18 DIAGNOSIS — D472 Monoclonal gammopathy: Secondary | ICD-10-CM | POA: Diagnosis not present

## 2021-04-18 DIAGNOSIS — D500 Iron deficiency anemia secondary to blood loss (chronic): Secondary | ICD-10-CM

## 2021-04-18 DIAGNOSIS — D509 Iron deficiency anemia, unspecified: Secondary | ICD-10-CM | POA: Diagnosis not present

## 2021-04-18 MED ORDER — SODIUM CHLORIDE 0.9 % IV SOLN
510.0000 mg | Freq: Once | INTRAVENOUS | Status: AC
  Administered 2021-04-18: 510 mg via INTRAVENOUS
  Filled 2021-04-18: qty 17

## 2021-04-18 MED ORDER — SODIUM CHLORIDE 0.9 % IV SOLN: Freq: Once | INTRAVENOUS | Status: AC

## 2021-04-18 NOTE — Patient Instructions (Signed)

## 2021-04-24 ENCOUNTER — Other Ambulatory Visit: Payer: Self-pay

## 2021-04-24 ENCOUNTER — Inpatient Hospital Stay: Payer: Medicare Other

## 2021-04-24 ENCOUNTER — Encounter: Payer: Self-pay | Admitting: Family

## 2021-04-24 ENCOUNTER — Inpatient Hospital Stay (HOSPITAL_BASED_OUTPATIENT_CLINIC_OR_DEPARTMENT_OTHER): Payer: Medicare Other | Admitting: Family

## 2021-04-24 VITALS — BP 134/67 | HR 94 | Temp 99.0°F | Resp 16 | Wt 137.1 lb

## 2021-04-24 DIAGNOSIS — E538 Deficiency of other specified B group vitamins: Secondary | ICD-10-CM

## 2021-04-24 DIAGNOSIS — D500 Iron deficiency anemia secondary to blood loss (chronic): Secondary | ICD-10-CM

## 2021-04-24 DIAGNOSIS — D509 Iron deficiency anemia, unspecified: Secondary | ICD-10-CM | POA: Diagnosis not present

## 2021-04-24 DIAGNOSIS — D472 Monoclonal gammopathy: Secondary | ICD-10-CM

## 2021-04-24 DIAGNOSIS — D631 Anemia in chronic kidney disease: Secondary | ICD-10-CM

## 2021-04-24 LAB — CBC WITH DIFFERENTIAL (CANCER CENTER ONLY)
Abs Immature Granulocytes: 0.07 10*3/uL (ref 0.00–0.07)
Basophils Absolute: 0 10*3/uL (ref 0.0–0.1)
Basophils Relative: 0 %
Eosinophils Absolute: 0.3 10*3/uL (ref 0.0–0.5)
Eosinophils Relative: 3 %
HCT: 39.1 % (ref 36.0–46.0)
Hemoglobin: 12.7 g/dL (ref 12.0–15.0)
Immature Granulocytes: 1 %
Lymphocytes Relative: 27 %
Lymphs Abs: 2.5 10*3/uL (ref 0.7–4.0)
MCH: 31 pg (ref 26.0–34.0)
MCHC: 32.5 g/dL (ref 30.0–36.0)
MCV: 95.4 fL (ref 80.0–100.0)
Monocytes Absolute: 1.1 10*3/uL — ABNORMAL HIGH (ref 0.1–1.0)
Monocytes Relative: 11 %
Neutro Abs: 5.5 10*3/uL (ref 1.7–7.7)
Neutrophils Relative %: 58 %
Platelet Count: 368 10*3/uL (ref 150–400)
RBC: 4.1 MIL/uL (ref 3.87–5.11)
RDW: 14.6 % (ref 11.5–15.5)
WBC Count: 9.4 10*3/uL (ref 4.0–10.5)
nRBC: 0 % (ref 0.0–0.2)

## 2021-04-24 LAB — CMP (CANCER CENTER ONLY)
ALT: 7 U/L (ref 0–44)
AST: 16 U/L (ref 15–41)
Albumin: 3.7 g/dL (ref 3.5–5.0)
Alkaline Phosphatase: 82 U/L (ref 38–126)
Anion gap: 7 (ref 5–15)
BUN: 16 mg/dL (ref 8–23)
CO2: 26 mmol/L (ref 22–32)
Calcium: 9.4 mg/dL (ref 8.9–10.3)
Chloride: 102 mmol/L (ref 98–111)
Creatinine: 0.82 mg/dL (ref 0.44–1.00)
GFR, Estimated: >60 mL/min (ref >60)
Glucose, Bld: 90 mg/dL (ref 70–99)
Potassium: 3.9 mmol/L (ref 3.5–5.1)
Sodium: 135 mmol/L (ref 135–145)
Total Bilirubin: 0.4 mg/dL (ref 0.3–1.2)
Total Protein: 7.9 g/dL (ref 6.5–8.1)

## 2021-04-24 LAB — RETICULOCYTES
Immature Retic Fract: 6.6 % (ref 2.3–15.9)
RBC.: 4.08 MIL/uL (ref 3.87–5.11)
Retic Count, Absolute: 63.6 10*3/uL (ref 19.0–186.0)
Retic Ct Pct: 1.6 % (ref 0.4–3.1)

## 2021-04-24 LAB — VITAMIN B12: Vitamin B-12: 357 pg/mL (ref 180–914)

## 2021-04-24 NOTE — Progress Notes (Signed)
Hematology and Oncology Follow Up Visit  Mallory Murphy [id number] Mar 29, 1946 76 y.o. 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 She is doing quite well and has no complaints at this time.  She denies fatigue at this time.  No obvious blood loss. No bruising or petechiae.  November M-spike was 1.0 g/dL, IgG level 2,532 mg/dL and kappa light chains 8.48 mg/dL.  No fever, chills, n/v, cough, rash, dizziness, SOB, chest pain, palpitations, abdominal pain or changes in bowel or bladder habits.  No swelling, numbness or tingling in Mallory Murphy extremities.  She is ambulating with a Rolator for added support.  No falls to report at this time.  Appetite is good. She is doing Mallory Murphy best to stay well hydrated. Mallory Murphy weight is stable at 137 lbs.   ECOG Performance Status: 1 - Symptomatic but completely ambulatory  Medications:  Allergies as of 04/24/2021       Reactions   Macrobid [nitrofurantoin Macrocrystal] Nausea Only        Medication List        Accurate as of April 24, 2021  3:15 PM. If you have any questions, ask your nurse or doctor.          acetaminophen 500 MG tablet Commonly known as: TYLENOL Take 500-1,000 mg by mouth every 6 (six) hours as needed for mild pain, fever or headache.   calcitonin (salmon) 200 UNIT/ACT nasal spray Commonly known as: MIACALCIN/FORTICAL   diphenhydrAMINE 25 MG tablet Commonly known as: BENADRYL Take 25-50 mg by mouth at bedtime.   escitalopram 10 MG tablet Commonly known as: LEXAPRO Take 10 mg by mouth daily.   losartan 100 MG tablet Commonly known as: COZAAR Take 100 mg by mouth daily.   losartan-hydrochlorothiazide 50-12.5 MG tablet Commonly known as: HYZAAR Take 1 tablet by mouth daily.    oxybutynin 5 MG tablet Commonly known as: DITROPAN Take 5 mg by mouth daily.   [place] ARIA IV Inject into the vein every 8 (eight) weeks. For RA [facility] Rhematology.        Allergies:  Allergies  Allergen Reactions   Macrobid [Nitrofurantoin Macrocrystal] Nausea Only    Past Medical History, Surgical history, Social history, and Family History were reviewed and updated.  Review of Systems: All other 10 point review of systems is negative.   Physical Exam:  weight is 137 lb 1.9 oz (62.2 kg). Mallory Murphy oral temperature is 99 F (37.2 C). Mallory Murphy blood pressure is 134/67 and Mallory Murphy pulse is 94. Mallory Murphy respiration is 16 and oxygen saturation is 97%.   Wt Readings from Last 3 Encounters:  04/24/21 137 lb 1.9 oz (62.2 kg)  01/30/21 137 lb (62.1 kg)  12/19/20 136 lb (61.7 kg)    Ocular: Sclerae unicteric, pupils equal, round and reactive to light Ear-nose-throat: Oropharynx clear, dentition fair Lymphatic: No cervical or supraclavicular adenopathy Lungs no rales or rhonchi, good excursion bilaterally Heart regular rate and rhythm, no murmur appreciated Abd soft, nontender, positive bowel sounds MSK no focal spinal tenderness, no joint edema Neuro: non-focal, well-oriented, appropriate affect Breasts: Deferred   Lab Results  Component Value Date   WBC 9.4 04/24/2021   HGB 12.7 04/24/2021   HCT 39.1 04/24/2021   MCV 95.4 04/24/2021  PLT 368 04/24/2021   Lab Results  Component Value Date   FERRITIN 391 (H) 03/13/2021   IRON 29 (L) 03/13/2021   TIBC 154 (L) 03/13/2021   UIBC 125 03/13/2021   IRONPCTSAT 19 (L) 03/13/2021   Lab Results  Component Value Date   RETICCTPCT 1.6 04/24/2021   RBC 4.08 04/24/2021   Lab Results  Component Value Date   KPAFRELGTCHN 84.8 (H) 03/13/2021   LAMBDASER 56.3 (H) 03/13/2021   KAPLAMBRATIO 1.51 03/13/2021   Lab Results  Component Value Date   IGGSERUM 2,532 (H) 03/13/2021   IGA 425 (H) 03/13/2021   IGMSERUM 127 03/13/2021   Lab  Results  Component Value Date   TOTALPROTELP 7.7 03/13/2021   ALBUMINELP 3.2 03/13/2021   A1GS 0.3 03/13/2021   A2GS 0.8 03/13/2021   BETS 1.0 03/13/2021   GAMS 2.3 (H) 03/13/2021   MSPIKE 1.0 (H) 03/13/2021   SPEI Comment 03/13/2021     Chemistry      Component Value Date/Time   NA 135 04/24/2021 1426   K 3.9 04/24/2021 1426   CL 102 04/24/2021 1426   CO2 26 04/24/2021 1426   BUN 16 04/24/2021 1426   CREATININE 0.82 04/24/2021 1426   CREATININE 1.19 (H) 05/07/2012 1553      Component Value Date/Time   CALCIUM 9.4 04/24/2021 1426   ALKPHOS 82 04/24/2021 1426   AST 16 04/24/2021 1426   ALT 7 04/24/2021 1426   BILITOT 0.4 04/24/2021 1426       Impression and Plan:  Mallory Murphy is a very pleasant 76 yo caucasian female with multifactorial anemia as well as IgG kappa MGUS.  Protein studies were stable in November.  No ESA, Hgb 12.7.  Iron studies are pending.  Lab and injection every 3 weeks and follow-up in 12 weeks.   Mallory Dawson, NP 1/11/20233:15 PM

## 2021-04-25 LAB — IRON AND IRON BINDING CAPACITY (CC-WL,HP ONLY)
Iron: 72 ug/dL (ref 28–170)
Saturation Ratios: 39 % — ABNORMAL HIGH (ref 10.4–31.8)
TIBC: 186 ug/dL — ABNORMAL LOW (ref 250–450)
UIBC: 114 ug/dL — ABNORMAL LOW (ref 148–442)

## 2021-04-25 LAB — FERRITIN: Ferritin: 922 ng/mL — ABNORMAL HIGH (ref 11–307)

## 2021-04-29 ENCOUNTER — Telehealth: Payer: Self-pay | Admitting: *Deleted

## 2021-04-29 NOTE — Telephone Encounter (Signed)
Per 04/24/21 los - called and lvm of upcoming appointments - requested call back to confirm - mailed calendar

## 2021-05-14 ENCOUNTER — Other Ambulatory Visit: Payer: Self-pay | Admitting: Family

## 2021-05-14 DIAGNOSIS — D500 Iron deficiency anemia secondary to blood loss (chronic): Secondary | ICD-10-CM

## 2021-05-14 DIAGNOSIS — D631 Anemia in chronic kidney disease: Secondary | ICD-10-CM

## 2021-05-15 ENCOUNTER — Other Ambulatory Visit: Payer: Self-pay

## 2021-05-15 ENCOUNTER — Inpatient Hospital Stay: Payer: Medicare Other

## 2021-05-15 ENCOUNTER — Inpatient Hospital Stay: Payer: Medicare Other | Attending: Hematology & Oncology

## 2021-05-15 VITALS — BP 129/68 | HR 97 | Temp 98.7°F | Resp 16

## 2021-05-15 DIAGNOSIS — N189 Chronic kidney disease, unspecified: Secondary | ICD-10-CM | POA: Diagnosis not present

## 2021-05-15 DIAGNOSIS — E538 Deficiency of other specified B group vitamins: Secondary | ICD-10-CM

## 2021-05-15 DIAGNOSIS — D631 Anemia in chronic kidney disease: Secondary | ICD-10-CM | POA: Diagnosis not present

## 2021-05-15 DIAGNOSIS — D509 Iron deficiency anemia, unspecified: Secondary | ICD-10-CM | POA: Insufficient documentation

## 2021-05-15 DIAGNOSIS — D500 Iron deficiency anemia secondary to blood loss (chronic): Secondary | ICD-10-CM

## 2021-05-15 LAB — CMP (CANCER CENTER ONLY)
ALT: 8 U/L (ref 0–44)
AST: 16 U/L (ref 15–41)
Albumin: 3.6 g/dL (ref 3.5–5.0)
Alkaline Phosphatase: 73 U/L (ref 38–126)
Anion gap: 8 (ref 5–15)
BUN: 18 mg/dL (ref 8–23)
CO2: 26 mmol/L (ref 22–32)
Calcium: 9.5 mg/dL (ref 8.9–10.3)
Chloride: 102 mmol/L (ref 98–111)
Creatinine: 0.88 mg/dL (ref 0.44–1.00)
GFR, Estimated: >60 mL/min (ref >60)
Glucose, Bld: 95 mg/dL (ref 70–99)
Potassium: 4.1 mmol/L (ref 3.5–5.1)
Sodium: 136 mmol/L (ref 135–145)
Total Bilirubin: 0.3 mg/dL (ref 0.3–1.2)
Total Protein: 8.1 g/dL (ref 6.5–8.1)

## 2021-05-15 LAB — CBC WITH DIFFERENTIAL (CANCER CENTER ONLY)
Abs Immature Granulocytes: 0.03 10*3/uL (ref 0.00–0.07)
Basophils Absolute: 0 10*3/uL (ref 0.0–0.1)
Basophils Relative: 0 %
Eosinophils Absolute: 0.4 10*3/uL (ref 0.0–0.5)
Eosinophils Relative: 4 %
HCT: 38.2 % (ref 36.0–46.0)
Hemoglobin: 12.7 g/dL (ref 12.0–15.0)
Immature Granulocytes: 0 %
Lymphocytes Relative: 33 %
Lymphs Abs: 3.3 10*3/uL (ref 0.7–4.0)
MCH: 30.5 pg (ref 26.0–34.0)
MCHC: 33.2 g/dL (ref 30.0–36.0)
MCV: 91.6 fL (ref 80.0–100.0)
Monocytes Absolute: 1 10*3/uL (ref 0.1–1.0)
Monocytes Relative: 10 %
Neutro Abs: 5.4 10*3/uL (ref 1.7–7.7)
Neutrophils Relative %: 53 %
Platelet Count: 330 10*3/uL (ref 150–400)
RBC: 4.17 MIL/uL (ref 3.87–5.11)
RDW: 14.5 % (ref 11.5–15.5)
WBC Count: 10.1 10*3/uL (ref 4.0–10.5)
nRBC: 0 % (ref 0.0–0.2)

## 2021-05-15 MED ORDER — CYANOCOBALAMIN 1000 MCG/ML IJ SOLN
1000.0000 ug | Freq: Once | INTRAMUSCULAR | Status: AC
  Administered 2021-05-15: 1000 ug via INTRAMUSCULAR
  Filled 2021-05-15: qty 1

## 2021-05-15 NOTE — Patient Instructions (Signed)
Vitamin B12 Deficiency Vitamin B12 deficiency means that your body does not have enough vitamin B12. The body needs this important vitamin: To make red blood cells. To make genes (DNA). To help the nerves work. If you do not have enough vitamin B12 in your body, you can have health problems, such as not having enough red blood cells in the blood (anemia). What are the causes? Not eating enough foods that contain vitamin B12. Not being able to take in (absorb) vitamin B12 from the food that you eat. Certain diseases. A condition in which the body does not make enough of a certain protein. This results in your body not taking in enough vitamin B12. Having a surgery in which part of the stomach or small intestine is taken out. Taking medicines that make it hard for the body to take in vitamin B12. These include: Heartburn medicines. Some medicines that are used to treat diabetes. What increases the risk? Being an older adult. Eating a vegetarian or vegan diet that does not include any foods that come from animals. Not eating enough foods that contain vitamin B12 while you are pregnant. Taking certain medicines. Having alcoholism. What are the signs or symptoms? In some cases, there are no symptoms. If the condition leads to too few blood cells or nerve damage, symptoms can occur, such as: Feeling weak or tired. Not being hungry. Losing feeling (numbness) or tingling in your hands and feet. Redness and burning of the tongue. Feeling sad (depressed). Confusion or memory problems. Trouble walking. If anemia is very bad, symptoms can include: Being short of breath. Being dizzy. Having a very fast heartbeat. How is this treated? Changing the way you eat and drink, such as: Eating more foods that contain vitamin B12. Drinking little or no alcohol. Getting vitamin B12 shots. Taking vitamin B12 supplements by mouth (orally). Your doctor will tell you the dose that is best for you. Follow  these instructions at home: Eating and drinking  Eat foods that come from animals and have a lot of vitamin B12 in them. These include: Meats and poultry. This includes beef, pork, chicken, turkey, and organ meats, such as liver. Seafood, such as clams, rainbow trout, salmon, tuna, and haddock. Eggs. Dairy foods such as milk, yogurt, and cheese. Eat breakfast cereals that have vitamin B12 added to them (are fortified). Check the label. The items listed above may not be a complete list of foods and beverages you can eat and drink. Contact a dietitian for more information. Alcohol use Do not drink alcohol if: Your doctor tells you not to drink. You are pregnant, may be pregnant, or are planning to become pregnant. If you drink alcohol: Limit how much you have to: 0-1 drink a day for women. 0-2 drinks a day for men. Know how much alcohol is in your drink. In the [place] one drink equals one 12 oz bottle of beer (355 mL), one 5 oz glass of wine (148 mL), or one 1 oz glass of hard liquor (44 mL). General instructions Get any vitamin B12 shots if told by your doctor. Take supplements only as told by your doctor. Follow the directions. Keep all follow-up visits. Contact a doctor if: Your symptoms come back. Your symptoms get worse or do not get better with treatment. Get help right away if: You have trouble breathing. You have a very fast heartbeat. You have chest pain. You get dizzy. You faint. These symptoms may be an emergency. Get help right away. Call 911.   Do not wait to see if the symptoms will go away. Do not drive yourself to the hospital. Summary Vitamin B12 deficiency means that your body is not getting enough of the vitamin. In some cases, there are no symptoms of this condition. Treatment may include making a change in the way you eat and drink, getting shots, or taking supplements. Eat foods that have vitamin B12 in them. This information is not intended to replace advice  given to you by your health care provider. Make sure you discuss any questions you have with your health care provider. Document Revised: 11/23/2020 Document Reviewed: 11/23/2020 Elsevier Patient Education  2022 Elsevier Inc.  

## 2021-06-05 ENCOUNTER — Inpatient Hospital Stay: Payer: Medicare Other

## 2021-06-06 ENCOUNTER — Other Ambulatory Visit: Payer: Self-pay

## 2021-06-06 ENCOUNTER — Inpatient Hospital Stay: Payer: Medicare Other

## 2021-06-06 DIAGNOSIS — E538 Deficiency of other specified B group vitamins: Secondary | ICD-10-CM

## 2021-06-06 DIAGNOSIS — D500 Iron deficiency anemia secondary to blood loss (chronic): Secondary | ICD-10-CM

## 2021-06-06 DIAGNOSIS — D509 Iron deficiency anemia, unspecified: Secondary | ICD-10-CM | POA: Diagnosis not present

## 2021-06-06 LAB — CMP (CANCER CENTER ONLY)
ALT: 10 U/L (ref 0–44)
AST: 19 U/L (ref 15–41)
Albumin: 3.2 g/dL — ABNORMAL LOW (ref 3.5–5.0)
Alkaline Phosphatase: 75 U/L (ref 38–126)
Anion gap: 6 (ref 5–15)
BUN: 19 mg/dL (ref 8–23)
CO2: 24 mmol/L (ref 22–32)
Calcium: 8.3 mg/dL — ABNORMAL LOW (ref 8.9–10.3)
Chloride: 106 mmol/L (ref 98–111)
Creatinine: 0.86 mg/dL (ref 0.44–1.00)
GFR, Estimated: >60 mL/min (ref >60)
Glucose, Bld: 124 mg/dL — ABNORMAL HIGH (ref 70–99)
Potassium: 4 mmol/L (ref 3.5–5.1)
Sodium: 136 mmol/L (ref 135–145)
Total Bilirubin: 0.3 mg/dL (ref 0.3–1.2)
Total Protein: 7.4 g/dL (ref 6.5–8.1)

## 2021-06-06 LAB — CBC WITH DIFFERENTIAL (CANCER CENTER ONLY)
Abs Immature Granulocytes: 0.03 10*3/uL (ref 0.00–0.07)
Basophils Absolute: 0 10*3/uL (ref 0.0–0.1)
Basophils Relative: 0 %
Eosinophils Absolute: 0.5 10*3/uL (ref 0.0–0.5)
Eosinophils Relative: 5 %
HCT: 35.2 % — ABNORMAL LOW (ref 36.0–46.0)
Hemoglobin: 11.9 g/dL — ABNORMAL LOW (ref 12.0–15.0)
Immature Granulocytes: 0 %
Lymphocytes Relative: 28 %
Lymphs Abs: 2.7 10*3/uL (ref 0.7–4.0)
MCH: 29.8 pg (ref 26.0–34.0)
MCHC: 33.8 g/dL (ref 30.0–36.0)
MCV: 88.2 fL (ref 80.0–100.0)
Monocytes Absolute: 1.1 10*3/uL — ABNORMAL HIGH (ref 0.1–1.0)
Monocytes Relative: 12 %
Neutro Abs: 5.2 10*3/uL (ref 1.7–7.7)
Neutrophils Relative %: 55 %
Platelet Count: 325 10*3/uL (ref 150–400)
RBC: 3.99 MIL/uL (ref 3.87–5.11)
RDW: 14.6 % (ref 11.5–15.5)
WBC Count: 9.6 10*3/uL (ref 4.0–10.5)
nRBC: 0 % (ref 0.0–0.2)

## 2021-06-06 LAB — VITAMIN B12: Vitamin B-12: 323 pg/mL (ref 180–914)

## 2021-06-26 ENCOUNTER — Other Ambulatory Visit: Payer: Medicare Other

## 2021-06-26 ENCOUNTER — Ambulatory Visit: Payer: Medicare Other

## 2021-06-27 ENCOUNTER — Inpatient Hospital Stay: Payer: Medicare Other | Attending: Hematology & Oncology

## 2021-06-27 ENCOUNTER — Other Ambulatory Visit: Payer: Self-pay

## 2021-06-27 ENCOUNTER — Inpatient Hospital Stay: Payer: Medicare Other

## 2021-06-27 DIAGNOSIS — D509 Iron deficiency anemia, unspecified: Secondary | ICD-10-CM | POA: Insufficient documentation

## 2021-06-27 DIAGNOSIS — D472 Monoclonal gammopathy: Secondary | ICD-10-CM | POA: Diagnosis not present

## 2021-06-27 LAB — CBC WITH DIFFERENTIAL (CANCER CENTER ONLY)
Abs Immature Granulocytes: 0.03 10*3/uL (ref 0.00–0.07)
Basophils Absolute: 0 10*3/uL (ref 0.0–0.1)
Basophils Relative: 0 %
Eosinophils Absolute: 0.5 10*3/uL (ref 0.0–0.5)
Eosinophils Relative: 6 %
HCT: 37.6 % (ref 36.0–46.0)
Hemoglobin: 12.6 g/dL (ref 12.0–15.0)
Immature Granulocytes: 0 %
Lymphocytes Relative: 29 %
Lymphs Abs: 2.6 10*3/uL (ref 0.7–4.0)
MCH: 29.4 pg (ref 26.0–34.0)
MCHC: 33.5 g/dL (ref 30.0–36.0)
MCV: 87.9 fL (ref 80.0–100.0)
Monocytes Absolute: 0.8 10*3/uL (ref 0.1–1.0)
Monocytes Relative: 9 %
Neutro Abs: 4.8 10*3/uL (ref 1.7–7.7)
Neutrophils Relative %: 56 %
Platelet Count: 344 10*3/uL (ref 150–400)
RBC: 4.28 MIL/uL (ref 3.87–5.11)
RDW: 15 % (ref 11.5–15.5)
WBC Count: 8.8 10*3/uL (ref 4.0–10.5)
nRBC: 0 % (ref 0.0–0.2)

## 2021-07-17 ENCOUNTER — Inpatient Hospital Stay (HOSPITAL_BASED_OUTPATIENT_CLINIC_OR_DEPARTMENT_OTHER): Payer: Medicare Other | Admitting: Family

## 2021-07-17 ENCOUNTER — Inpatient Hospital Stay: Payer: Medicare Other

## 2021-07-17 ENCOUNTER — Encounter: Payer: Self-pay | Admitting: Family

## 2021-07-17 ENCOUNTER — Inpatient Hospital Stay: Payer: Medicare Other | Attending: Hematology & Oncology

## 2021-07-17 VITALS — BP 109/58 | HR 91 | Temp 98.6°F | Resp 17 | Wt 137.0 lb

## 2021-07-17 DIAGNOSIS — D472 Monoclonal gammopathy: Secondary | ICD-10-CM

## 2021-07-17 DIAGNOSIS — D631 Anemia in chronic kidney disease: Secondary | ICD-10-CM | POA: Diagnosis not present

## 2021-07-17 DIAGNOSIS — D509 Iron deficiency anemia, unspecified: Secondary | ICD-10-CM | POA: Diagnosis not present

## 2021-07-17 DIAGNOSIS — D500 Iron deficiency anemia secondary to blood loss (chronic): Secondary | ICD-10-CM

## 2021-07-17 DIAGNOSIS — N189 Chronic kidney disease, unspecified: Secondary | ICD-10-CM | POA: Diagnosis present

## 2021-07-17 LAB — CMP (CANCER CENTER ONLY)
ALT: 13 U/L (ref 0–44)
AST: 21 U/L (ref 15–41)
Albumin: 3.8 g/dL (ref 3.5–5.0)
Alkaline Phosphatase: 80 U/L (ref 38–126)
Anion gap: 6 (ref 5–15)
BUN: 22 mg/dL (ref 8–23)
CO2: 27 mmol/L (ref 22–32)
Calcium: 9.3 mg/dL (ref 8.9–10.3)
Chloride: 103 mmol/L (ref 98–111)
Creatinine: 0.91 mg/dL (ref 0.44–1.00)
GFR, Estimated: >60 mL/min (ref >60)
Glucose, Bld: 125 mg/dL — ABNORMAL HIGH (ref 70–99)
Potassium: 4 mmol/L (ref 3.5–5.1)
Sodium: 136 mmol/L (ref 135–145)
Total Bilirubin: 0.4 mg/dL (ref 0.3–1.2)
Total Protein: 7.6 g/dL (ref 6.5–8.1)

## 2021-07-17 LAB — CBC WITH DIFFERENTIAL (CANCER CENTER ONLY)
Abs Immature Granulocytes: 0.1 10*3/uL — ABNORMAL HIGH (ref 0.00–0.07)
Basophils Absolute: 0 10*3/uL (ref 0.0–0.1)
Basophils Relative: 0 %
Eosinophils Absolute: 0.5 10*3/uL (ref 0.0–0.5)
Eosinophils Relative: 4 %
HCT: 35.2 % — ABNORMAL LOW (ref 36.0–46.0)
Hemoglobin: 11.7 g/dL — ABNORMAL LOW (ref 12.0–15.0)
Immature Granulocytes: 1 %
Lymphocytes Relative: 26 %
Lymphs Abs: 2.7 10*3/uL (ref 0.7–4.0)
MCH: 29.3 pg (ref 26.0–34.0)
MCHC: 33.2 g/dL (ref 30.0–36.0)
MCV: 88.2 fL (ref 80.0–100.0)
Monocytes Absolute: 1 10*3/uL (ref 0.1–1.0)
Monocytes Relative: 9 %
Neutro Abs: 6.3 10*3/uL (ref 1.7–7.7)
Neutrophils Relative %: 60 %
Platelet Count: 313 10*3/uL (ref 150–400)
RBC: 3.99 MIL/uL (ref 3.87–5.11)
RDW: 15.5 % (ref 11.5–15.5)
WBC Count: 10.5 10*3/uL (ref 4.0–10.5)
nRBC: 0 % (ref 0.0–0.2)

## 2021-07-17 LAB — RETICULOCYTES
Immature Retic Fract: 6.6 % (ref 2.3–15.9)
RBC.: 4.02 MIL/uL (ref 3.87–5.11)
Retic Count, Absolute: 46.2 10*3/uL (ref 19.0–186.0)
Retic Ct Pct: 1.2 % (ref 0.4–3.1)

## 2021-07-17 NOTE — Progress Notes (Signed)
?Hematology and Oncology Follow Up Visit ? ?Mallory Murphy ?[id number] ?03-19-1946 76 y.o. ?07/17/2021 ? ? ?Principle Diagnosis:  ?Anemia of erythropoitin deficeincy ?Anemia of Iron deficiency ?Rheumatoid Arthritis ?IgG Kappa MGUS ?  ?Current Therapy:        ?IV Iron as indicated  ?Aranesp 300 mcg sq for Hgb <11 ?  ?Interim History:  Mallory Murphy is here today for follow-up. Hgb 11.7! She is doing well.  ?She has some fatigue at times. Mild SOB only with over exertion.  ?No fever, chills, n/v, cough, rash, dizziness, chest pain, palpitations, abdominal pain or changes in bowel or bladder habits.  ?No blood loss noted. No abnormal bruising, no petechiae.  ?No swelling, tenderness, numbness or tingling in her extremities.  ?No falls or syncope. She ambulates with a Rolator for added support.  ?She has maintained a good appetite and is doing her best to stay well hydrated. Her weight is stable at 137 lbs.  ? ?ECOG Performance Status: 1 - Symptomatic but completely ambulatory ? ?Medications:  ?Allergies as of 07/17/2021   ? ?   Reactions  ? Macrobid [nitrofurantoin Macrocrystal] Nausea Only  ? ?  ? ?  ?Medication List  ?  ? ?  ? Accurate as of July 17, 2021  3:04 PM. If you have any questions, ask your nurse or doctor.  ?  ?  ? ?  ? ?acetaminophen 500 MG tablet ?Commonly known as: TYLENOL ?Take 500-1,000 mg by mouth every 6 (six) hours as needed for mild pain, fever or headache. ?  ?calcitonin (salmon) 200 UNIT/ACT nasal spray ?Commonly known as: MIACALCIN/FORTICAL ?  ?diphenhydrAMINE 25 MG tablet ?Commonly known as: BENADRYL ?Take 25-50 mg by mouth at bedtime. ?  ?escitalopram 10 MG tablet ?Commonly known as: LEXAPRO ?Take 10 mg by mouth daily. ?  ?losartan 100 MG tablet ?Commonly known as: COZAAR ?Take 100 mg by mouth daily. ?  ?losartan-hydrochlorothiazide 50-12.5 MG tablet ?Commonly known as: HYZAAR ?Take 1 tablet by mouth daily. ?  ?MELATONIN PO ?Take by mouth at bedtime as needed. ?  ?oxybutynin 5 MG tablet ?Commonly  known as: DITROPAN ?Take 5 mg by mouth daily. ?  ?[place] ARIA IV ?Inject into the vein every 8 (eight) weeks. For RA [facility] Rhematology. ?  ? ?  ? ? ?Allergies:  ?Allergies  ?Allergen Reactions  ? Macrobid [Nitrofurantoin Macrocrystal] Nausea Only  ? ? ?Past Medical History, Surgical history, Social history, and Family History were reviewed and updated. ? ?Review of Systems: ?All other 10 point review of systems is negative.  ? ?Physical Exam: ? weight is 137 lb (62.1 kg). Her oral temperature is 98.6 ?F (37 ?C). Her blood pressure is 109/58 (abnormal) and her pulse is 91. Her respiration is 17 and oxygen saturation is 97%.  ? ?Wt Readings from Last 3 Encounters:  ?07/17/21 137 lb (62.1 kg)  ?04/24/21 137 lb 1.9 oz (62.2 kg)  ?01/30/21 137 lb (62.1 kg)  ? ? ?Ocular: Sclerae unicteric, pupils equal, round and reactive to light ?Ear-nose-throat: Oropharynx clear, dentition fair ?Lymphatic: No cervical or supraclavicular adenopathy ?Lungs no rales or rhonchi, good excursion bilaterally ?Heart regular rate and rhythm, no murmur appreciated ?Abd soft, nontender, positive bowel sounds ?MSK no focal spinal tenderness, no joint edema ?Neuro: non-focal, well-oriented, appropriate affect ?Breasts: Deferred ? ?Lab Results  ?Component Value Date  ? WBC 10.5 07/17/2021  ? HGB 11.7 (L) 07/17/2021  ? HCT 35.2 (L) 07/17/2021  ? MCV 88.2 07/17/2021  ? PLT 313 07/17/2021  ? ?  Lab Results  ?Component Value Date  ? FERRITIN 922 (H) 04/24/2021  ? IRON 72 04/24/2021  ? TIBC 186 (L) 04/24/2021  ? UIBC 114 (L) 04/24/2021  ? IRONPCTSAT 39 (H) 04/24/2021  ? ?Lab Results  ?Component Value Date  ? RETICCTPCT 1.2 07/17/2021  ? RBC 3.99 07/17/2021  ? RBC 4.02 07/17/2021  ? ?Lab Results  ?Component Value Date  ? KPAFRELGTCHN 84.8 (H) 03/13/2021  ? LAMBDASER 56.3 (H) 03/13/2021  ? KAPLAMBRATIO 1.51 03/13/2021  ? ?Lab Results  ?Component Value Date  ? IGGSERUM 2,532 (H) 03/13/2021  ? IGA 425 (H) 03/13/2021  ? IGMSERUM 127 03/13/2021  ? ?Lab  Results  ?Component Value Date  ? TOTALPROTELP 7.7 03/13/2021  ? ALBUMINELP 3.2 03/13/2021  ? A1GS 0.3 03/13/2021  ? A2GS 0.8 03/13/2021  ? BETS 1.0 03/13/2021  ? GAMS 2.3 (H) 03/13/2021  ? MSPIKE 1.0 (H) 03/13/2021  ? SPEI Comment 03/13/2021  ? ?  Chemistry   ?   ?Component Value Date/Time  ? NA 136 06/06/2021 1502  ? K 4.0 06/06/2021 1502  ? CL 106 06/06/2021 1502  ? CO2 24 06/06/2021 1502  ? BUN 19 06/06/2021 1502  ? CREATININE 0.86 06/06/2021 1502  ? CREATININE 1.19 (H) 05/07/2012 1553  ?    ?Component Value Date/Time  ? CALCIUM 8.3 (L) 06/06/2021 1502  ? ALKPHOS 75 06/06/2021 1502  ? AST 19 06/06/2021 1502  ? ALT 10 06/06/2021 1502  ? BILITOT 0.3 06/06/2021 1502  ?  ? ? ? ?Impression and Plan:  Mallory Murphy is a very pleasant 76 yo caucasian female with multifactorial anemia as well as IgG kappa MGUS.  ?Protein studies are pending.  ?No ESA needed, Hgb 11.7.  ?Iron studies are pending.  ?Lab and injection monthly, follow-up in 4 months.  ? ?Lottie Dawson, NP ?4/5/20233:04 PM ? ?

## 2021-07-18 LAB — IRON AND IRON BINDING CAPACITY (CC-WL,HP ONLY)
Iron: 87 ug/dL (ref 28–170)
Saturation Ratios: 43 % — ABNORMAL HIGH (ref 10.4–31.8)
TIBC: 204 ug/dL — ABNORMAL LOW (ref 250–450)
UIBC: 117 ug/dL — ABNORMAL LOW (ref 148–442)

## 2021-07-18 LAB — KAPPA/LAMBDA LIGHT CHAINS
Kappa free light chain: 63.1 mg/L — ABNORMAL HIGH (ref 3.3–19.4)
Kappa, lambda light chain ratio: 1.56 (ref 0.26–1.65)
Lambda free light chains: 40.4 mg/L — ABNORMAL HIGH (ref 5.7–26.3)

## 2021-07-18 LAB — IGG, IGA, IGM
IgA: 415 mg/dL (ref 64–422)
IgG (Immunoglobin G), Serum: 2231 mg/dL — ABNORMAL HIGH (ref 586–1602)
IgM (Immunoglobulin M), Srm: 109 mg/dL (ref 26–217)

## 2021-07-18 LAB — FERRITIN: Ferritin: 670 ng/mL — ABNORMAL HIGH (ref 11–307)

## 2021-07-19 LAB — PROTEIN ELECTROPHORESIS, SERUM
A/G Ratio: 0.8 (ref 0.7–1.7)
Albumin ELP: 3.3 g/dL (ref 2.9–4.4)
Alpha-1-Globulin: 0.2 g/dL (ref 0.0–0.4)
Alpha-2-Globulin: 0.8 g/dL (ref 0.4–1.0)
Beta Globulin: 1 g/dL (ref 0.7–1.3)
Gamma Globulin: 2 g/dL — ABNORMAL HIGH (ref 0.4–1.8)
Globulin, Total: 3.9 g/dL (ref 2.2–3.9)
M-Spike, %: 0.8 g/dL — ABNORMAL HIGH
Total Protein ELP: 7.2 g/dL (ref 6.0–8.5)

## 2021-08-15 ENCOUNTER — Inpatient Hospital Stay: Payer: Medicare Other

## 2021-08-15 ENCOUNTER — Inpatient Hospital Stay: Payer: Medicare Other | Attending: Hematology & Oncology

## 2021-08-15 DIAGNOSIS — D631 Anemia in chronic kidney disease: Secondary | ICD-10-CM | POA: Diagnosis not present

## 2021-08-15 DIAGNOSIS — N183 Chronic kidney disease, stage 3 unspecified: Secondary | ICD-10-CM | POA: Diagnosis present

## 2021-08-15 LAB — CMP (CANCER CENTER ONLY)
ALT: 9 U/L (ref 0–44)
AST: 17 U/L (ref 15–41)
Albumin: 3.7 g/dL (ref 3.5–5.0)
Alkaline Phosphatase: 74 U/L (ref 38–126)
Anion gap: 6 (ref 5–15)
BUN: 18 mg/dL (ref 8–23)
CO2: 25 mmol/L (ref 22–32)
Calcium: 8.9 mg/dL (ref 8.9–10.3)
Chloride: 106 mmol/L (ref 98–111)
Creatinine: 0.76 mg/dL (ref 0.44–1.00)
GFR, Estimated: >60 mL/min (ref >60)
Glucose, Bld: 119 mg/dL — ABNORMAL HIGH (ref 70–99)
Potassium: 4.5 mmol/L (ref 3.5–5.1)
Sodium: 137 mmol/L (ref 135–145)
Total Bilirubin: 0.3 mg/dL (ref 0.3–1.2)
Total Protein: 7.7 g/dL (ref 6.5–8.1)

## 2021-08-15 LAB — CBC WITH DIFFERENTIAL (CANCER CENTER ONLY)
Abs Immature Granulocytes: 0.03 10*3/uL (ref 0.00–0.07)
Basophils Absolute: 0 10*3/uL (ref 0.0–0.1)
Basophils Relative: 0 %
Eosinophils Absolute: 0.4 10*3/uL (ref 0.0–0.5)
Eosinophils Relative: 3 %
HCT: 33.6 % — ABNORMAL LOW (ref 36.0–46.0)
Hemoglobin: 11.2 g/dL — ABNORMAL LOW (ref 12.0–15.0)
Immature Granulocytes: 0 %
Lymphocytes Relative: 26 %
Lymphs Abs: 2.9 10*3/uL (ref 0.7–4.0)
MCH: 30.7 pg (ref 26.0–34.0)
MCHC: 33.3 g/dL (ref 30.0–36.0)
MCV: 92.1 fL (ref 80.0–100.0)
Monocytes Absolute: 1.2 10*3/uL — ABNORMAL HIGH (ref 0.1–1.0)
Monocytes Relative: 11 %
Neutro Abs: 6.6 10*3/uL (ref 1.7–7.7)
Neutrophils Relative %: 60 %
Platelet Count: 367 10*3/uL (ref 150–400)
RBC: 3.65 MIL/uL — ABNORMAL LOW (ref 3.87–5.11)
RDW: 15.3 % (ref 11.5–15.5)
WBC Count: 11.2 10*3/uL — ABNORMAL HIGH (ref 4.0–10.5)
nRBC: 0 % (ref 0.0–0.2)

## 2021-08-15 NOTE — Progress Notes (Signed)
Reviewed pt labs with patient and pt does not meet criteria for her injection today. Pt excited with the news. Pt given a copy of her labs and left ambulatory in no distress.  ?

## 2021-08-16 ENCOUNTER — Ambulatory Visit: Payer: Medicare Other

## 2021-08-16 ENCOUNTER — Other Ambulatory Visit: Payer: Medicare Other

## 2021-09-16 ENCOUNTER — Ambulatory Visit: Payer: Medicare Other

## 2021-09-16 ENCOUNTER — Other Ambulatory Visit: Payer: Medicare Other

## 2021-09-19 ENCOUNTER — Inpatient Hospital Stay: Payer: Medicare Other

## 2021-09-19 ENCOUNTER — Other Ambulatory Visit: Payer: Self-pay | Admitting: Oncology

## 2021-09-19 ENCOUNTER — Inpatient Hospital Stay: Payer: Medicare Other | Attending: Hematology & Oncology

## 2021-09-19 DIAGNOSIS — D631 Anemia in chronic kidney disease: Secondary | ICD-10-CM

## 2021-09-19 DIAGNOSIS — N183 Chronic kidney disease, stage 3 unspecified: Secondary | ICD-10-CM | POA: Diagnosis present

## 2021-09-19 LAB — CMP (CANCER CENTER ONLY)
ALT: 12 U/L (ref 0–44)
AST: 19 U/L (ref 15–41)
Albumin: 3.9 g/dL (ref 3.5–5.0)
Alkaline Phosphatase: 72 U/L (ref 38–126)
Anion gap: 8 (ref 5–15)
BUN: 22 mg/dL (ref 8–23)
CO2: 22 mmol/L (ref 22–32)
Calcium: 9 mg/dL (ref 8.9–10.3)
Chloride: 105 mmol/L (ref 98–111)
Creatinine: 0.9 mg/dL (ref 0.44–1.00)
GFR, Estimated: >60 mL/min (ref >60)
Glucose, Bld: 104 mg/dL — ABNORMAL HIGH (ref 70–99)
Potassium: 4.5 mmol/L (ref 3.5–5.1)
Sodium: 135 mmol/L (ref 135–145)
Total Bilirubin: 0.3 mg/dL (ref 0.3–1.2)
Total Protein: 7.9 g/dL (ref 6.5–8.1)

## 2021-09-19 LAB — CBC WITH DIFFERENTIAL (CANCER CENTER ONLY)
Abs Immature Granulocytes: 0.04 10*3/uL (ref 0.00–0.07)
Basophils Absolute: 0 10*3/uL (ref 0.0–0.1)
Basophils Relative: 0 %
Eosinophils Absolute: 0.5 10*3/uL (ref 0.0–0.5)
Eosinophils Relative: 4 %
HCT: 35.2 % — ABNORMAL LOW (ref 36.0–46.0)
Hemoglobin: 11.8 g/dL — ABNORMAL LOW (ref 12.0–15.0)
Immature Granulocytes: 0 %
Lymphocytes Relative: 26 %
Lymphs Abs: 3.2 10*3/uL (ref 0.7–4.0)
MCH: 30.8 pg (ref 26.0–34.0)
MCHC: 33.5 g/dL (ref 30.0–36.0)
MCV: 91.9 fL (ref 80.0–100.0)
Monocytes Absolute: 1.1 10*3/uL — ABNORMAL HIGH (ref 0.1–1.0)
Monocytes Relative: 9 %
Neutro Abs: 7.4 10*3/uL (ref 1.7–7.7)
Neutrophils Relative %: 61 %
Platelet Count: 331 10*3/uL (ref 150–400)
RBC: 3.83 MIL/uL — ABNORMAL LOW (ref 3.87–5.11)
RDW: 13.4 % (ref 11.5–15.5)
WBC Count: 12.2 10*3/uL — ABNORMAL HIGH (ref 4.0–10.5)
nRBC: 0 % (ref 0.0–0.2)

## 2021-09-19 NOTE — Progress Notes (Signed)
Reviewed labs with patient and educated patient that she did not need her injection today as her hemaglobin 11.8. Pt excited with news and verbalized understanding. Pt left in no apparent distress.

## 2021-10-16 ENCOUNTER — Inpatient Hospital Stay: Payer: Medicare Other | Attending: Hematology & Oncology

## 2021-10-16 ENCOUNTER — Inpatient Hospital Stay: Payer: Medicare Other

## 2021-10-16 DIAGNOSIS — N183 Chronic kidney disease, stage 3 unspecified: Secondary | ICD-10-CM | POA: Insufficient documentation

## 2021-10-16 DIAGNOSIS — D631 Anemia in chronic kidney disease: Secondary | ICD-10-CM | POA: Diagnosis not present

## 2021-10-16 LAB — CBC WITH DIFFERENTIAL (CANCER CENTER ONLY)
Abs Immature Granulocytes: 0.04 10*3/uL (ref 0.00–0.07)
Basophils Absolute: 0 10*3/uL (ref 0.0–0.1)
Basophils Relative: 0 %
Eosinophils Absolute: 0.3 10*3/uL (ref 0.0–0.5)
Eosinophils Relative: 3 %
HCT: 36 % (ref 36.0–46.0)
Hemoglobin: 12 g/dL (ref 12.0–15.0)
Immature Granulocytes: 0 %
Lymphocytes Relative: 24 %
Lymphs Abs: 2.6 10*3/uL (ref 0.7–4.0)
MCH: 30.3 pg (ref 26.0–34.0)
MCHC: 33.3 g/dL (ref 30.0–36.0)
MCV: 90.9 fL (ref 80.0–100.0)
Monocytes Absolute: 1.1 10*3/uL — ABNORMAL HIGH (ref 0.1–1.0)
Monocytes Relative: 10 %
Neutro Abs: 6.7 10*3/uL (ref 1.7–7.7)
Neutrophils Relative %: 63 %
Platelet Count: 340 10*3/uL (ref 150–400)
RBC: 3.96 MIL/uL (ref 3.87–5.11)
RDW: 13 % (ref 11.5–15.5)
WBC Count: 10.8 10*3/uL — ABNORMAL HIGH (ref 4.0–10.5)
nRBC: 0 % (ref 0.0–0.2)

## 2021-10-16 LAB — CMP (CANCER CENTER ONLY)
ALT: 12 U/L (ref 0–44)
AST: 22 U/L (ref 15–41)
Albumin: 3.5 g/dL (ref 3.5–5.0)
Alkaline Phosphatase: 71 U/L (ref 38–126)
Anion gap: 8 (ref 5–15)
BUN: 27 mg/dL — ABNORMAL HIGH (ref 8–23)
CO2: 22 mmol/L (ref 22–32)
Calcium: 8.9 mg/dL (ref 8.9–10.3)
Chloride: 105 mmol/L (ref 98–111)
Creatinine: 0.97 mg/dL (ref 0.44–1.00)
GFR, Estimated: >60 mL/min (ref >60)
Glucose, Bld: 125 mg/dL — ABNORMAL HIGH (ref 70–99)
Potassium: 4.3 mmol/L (ref 3.5–5.1)
Sodium: 135 mmol/L (ref 135–145)
Total Bilirubin: 0.3 mg/dL (ref 0.3–1.2)
Total Protein: 8 g/dL (ref 6.5–8.1)

## 2021-11-18 ENCOUNTER — Ambulatory Visit: Payer: Medicare Other | Admitting: Family

## 2021-11-18 ENCOUNTER — Ambulatory Visit: Payer: Medicare Other

## 2021-11-18 ENCOUNTER — Other Ambulatory Visit: Payer: Medicare Other

## 2021-11-19 ENCOUNTER — Inpatient Hospital Stay: Payer: Medicare Other | Attending: Hematology & Oncology

## 2021-11-19 ENCOUNTER — Other Ambulatory Visit: Payer: Self-pay

## 2021-11-19 ENCOUNTER — Encounter: Payer: Self-pay | Admitting: Family

## 2021-11-19 ENCOUNTER — Inpatient Hospital Stay (HOSPITAL_BASED_OUTPATIENT_CLINIC_OR_DEPARTMENT_OTHER): Payer: Medicare Other | Admitting: Family

## 2021-11-19 ENCOUNTER — Inpatient Hospital Stay: Payer: Medicare Other

## 2021-11-19 VITALS — BP 124/58 | HR 100 | Temp 98.2°F | Resp 19 | Ht 61.0 in | Wt 139.0 lb

## 2021-11-19 DIAGNOSIS — D631 Anemia in chronic kidney disease: Secondary | ICD-10-CM | POA: Insufficient documentation

## 2021-11-19 DIAGNOSIS — D509 Iron deficiency anemia, unspecified: Secondary | ICD-10-CM | POA: Diagnosis not present

## 2021-11-19 DIAGNOSIS — D500 Iron deficiency anemia secondary to blood loss (chronic): Secondary | ICD-10-CM

## 2021-11-19 DIAGNOSIS — D472 Monoclonal gammopathy: Secondary | ICD-10-CM | POA: Diagnosis not present

## 2021-11-19 DIAGNOSIS — N189 Chronic kidney disease, unspecified: Secondary | ICD-10-CM | POA: Diagnosis present

## 2021-11-19 DIAGNOSIS — M069 Rheumatoid arthritis, unspecified: Secondary | ICD-10-CM | POA: Diagnosis not present

## 2021-11-19 LAB — CMP (CANCER CENTER ONLY)
ALT: 12 U/L (ref 0–44)
AST: 20 U/L (ref 15–41)
Albumin: 3.4 g/dL — ABNORMAL LOW (ref 3.5–5.0)
Alkaline Phosphatase: 69 U/L (ref 38–126)
Anion gap: 5 (ref 5–15)
BUN: 19 mg/dL (ref 8–23)
CO2: 23 mmol/L (ref 22–32)
Calcium: 8.4 mg/dL — ABNORMAL LOW (ref 8.9–10.3)
Chloride: 110 mmol/L (ref 98–111)
Creatinine: 0.83 mg/dL (ref 0.44–1.00)
GFR, Estimated: >60 mL/min (ref >60)
Glucose, Bld: 114 mg/dL — ABNORMAL HIGH (ref 70–99)
Potassium: 4 mmol/L (ref 3.5–5.1)
Sodium: 138 mmol/L (ref 135–145)
Total Bilirubin: 0.3 mg/dL (ref 0.3–1.2)
Total Protein: 7.9 g/dL (ref 6.5–8.1)

## 2021-11-19 LAB — CBC WITH DIFFERENTIAL (CANCER CENTER ONLY)
Abs Immature Granulocytes: 0.02 10*3/uL (ref 0.00–0.07)
Basophils Absolute: 0 10*3/uL (ref 0.0–0.1)
Basophils Relative: 0 %
Eosinophils Absolute: 0.5 10*3/uL (ref 0.0–0.5)
Eosinophils Relative: 5 %
HCT: 34.9 % — ABNORMAL LOW (ref 36.0–46.0)
Hemoglobin: 11.6 g/dL — ABNORMAL LOW (ref 12.0–15.0)
Immature Granulocytes: 0 %
Lymphocytes Relative: 28 %
Lymphs Abs: 2.6 10*3/uL (ref 0.7–4.0)
MCH: 30.3 pg (ref 26.0–34.0)
MCHC: 33.2 g/dL (ref 30.0–36.0)
MCV: 91.1 fL (ref 80.0–100.0)
Monocytes Absolute: 0.9 10*3/uL (ref 0.1–1.0)
Monocytes Relative: 10 %
Neutro Abs: 5.3 10*3/uL (ref 1.7–7.7)
Neutrophils Relative %: 57 %
Platelet Count: 328 10*3/uL (ref 150–400)
RBC: 3.83 MIL/uL — ABNORMAL LOW (ref 3.87–5.11)
RDW: 13.4 % (ref 11.5–15.5)
WBC Count: 9.5 10*3/uL (ref 4.0–10.5)
nRBC: 0 % (ref 0.0–0.2)

## 2021-11-19 LAB — RETICULOCYTES
Immature Retic Fract: 11.2 % (ref 2.3–15.9)
RBC.: 3.8 MIL/uL — ABNORMAL LOW (ref 3.87–5.11)
Retic Count, Absolute: 49.4 10*3/uL (ref 19.0–186.0)
Retic Ct Pct: 1.3 % (ref 0.4–3.1)

## 2021-11-19 LAB — FERRITIN: Ferritin: 543 ng/mL — ABNORMAL HIGH (ref 11–307)

## 2021-11-19 NOTE — Progress Notes (Signed)
Hematology and Oncology Follow Up Visit  Mallory Murphy [id number] 11/17/1945 76 y.o. 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 She is doing well but notes some fatigue at times.  Hgb is stable at 11.6, MCV 91, platelets 328 and WBC count 9.5.  In April, M-spike was 0.8 g/dL, IgG level was 2,231 mg/dL and kappa light chains 6.31 mg/dL.  She has not noted any blood loss. No abnormal bruising, no petechiae.  No fever, chills, n/v, cough, rash, dizziness, SOB, chest pain, palpitations, abdominal pain or changes in bowel or bladder habits.  No swelling, tenderness, numbness or tingling in her extremities at this time.  No falls or syncope.  Appetite and hydration are good. Weight is stable at 139 lbs.   ECOG Performance Status: 1 - Symptomatic but completely ambulatory  Medications:  Allergies as of 11/19/2021       Reactions   Macrobid [nitrofurantoin Macrocrystal] Nausea Only        Medication List        Accurate as of November 19, 2021  3:02 PM. If you have any questions, ask your nurse or doctor.          acetaminophen 500 MG tablet Commonly known as: TYLENOL Take 500-1,000 mg by mouth every 6 (six) hours as needed for mild pain, fever or headache.   calcitonin (salmon) 200 UNIT/ACT nasal spray Commonly known as: MIACALCIN/FORTICAL   diphenhydrAMINE 25 MG tablet Commonly known as: BENADRYL Take 25-50 mg by mouth at bedtime.   escitalopram 10 MG tablet Commonly known as: LEXAPRO Take 10 mg by mouth daily.   losartan 100 MG tablet Commonly known as: COZAAR Take 100 mg by mouth daily.   losartan-hydrochlorothiazide 50-12.5 MG tablet Commonly known as: HYZAAR Take 1 tablet by mouth daily.   MELATONIN PO Take by mouth at bedtime  as needed.   oxybutynin 5 MG tablet Commonly known as: DITROPAN Take 5 mg by mouth daily.   [place] ARIA IV Inject into the vein every 8 (eight) weeks. For RA [facility] Rhematology.        Allergies:  Allergies  Allergen Reactions   Macrobid [Nitrofurantoin Macrocrystal] Nausea Only    Past Medical History, Surgical history, Social history, and Family History were reviewed and updated.  Review of Systems: All other 10 point review of systems is negative.   Physical Exam:  height is '5\' 1"'$  (1.549 m) and weight is 139 lb (63 kg). Her oral temperature is 98.2 F (36.8 C). Her blood pressure is 124/58 (abnormal) and her pulse is 100. Her respiration is 19 and oxygen saturation is 100%.   Wt Readings from Last 3 Encounters:  11/19/21 139 lb (63 kg)  07/17/21 137 lb (62.1 kg)  04/24/21 137 lb 1.9 oz (62.2 kg)    Ocular: Sclerae unicteric, pupils equal, round and reactive to light Ear-nose-throat: Oropharynx clear, dentition fair Lymphatic: No cervical or supraclavicular adenopathy Lungs no rales or rhonchi, good excursion bilaterally Heart regular rate and rhythm, no murmur appreciated Abd soft, nontender, positive bowel sounds MSK no focal spinal tenderness, no joint edema Neuro: non-focal, well-oriented, appropriate affect Breasts: Deferred   Lab Results  Component Value Date   WBC 9.5 11/19/2021   HGB 11.6 (  L) 11/19/2021   HCT 34.9 (L) 11/19/2021   MCV 91.1 11/19/2021   PLT 328 11/19/2021   Lab Results  Component Value Date   FERRITIN 670 (H) 07/17/2021   IRON 87 07/17/2021   TIBC 204 (L) 07/17/2021   UIBC 117 (L) 07/17/2021   IRONPCTSAT 43 (H) 07/17/2021   Lab Results  Component Value Date   RETICCTPCT 1.3 11/19/2021   RBC 3.80 (L) 11/19/2021   Lab Results  Component Value Date   KPAFRELGTCHN 63.1 (H) 07/17/2021   LAMBDASER 40.4 (H) 07/17/2021   KAPLAMBRATIO 1.56 07/17/2021   Lab Results  Component Value Date   IGGSERUM 2,231 (H) 07/17/2021    IGA 415 07/17/2021   IGMSERUM 109 07/17/2021   Lab Results  Component Value Date   TOTALPROTELP 7.2 07/17/2021   ALBUMINELP 3.3 07/17/2021   A1GS 0.2 07/17/2021   A2GS 0.8 07/17/2021   BETS 1.0 07/17/2021   GAMS 2.0 (H) 07/17/2021   MSPIKE 0.8 (H) 07/17/2021   SPEI Comment 07/17/2021     Chemistry      Component Value Date/Time   NA 135 10/16/2021 1454   K 4.3 10/16/2021 1454   CL 105 10/16/2021 1454   CO2 22 10/16/2021 1454   BUN 27 (H) 10/16/2021 1454   CREATININE 0.97 10/16/2021 1454   CREATININE 1.19 (H) 05/07/2012 1553      Component Value Date/Time   CALCIUM 8.9 10/16/2021 1454   ALKPHOS 71 10/16/2021 1454   AST 22 10/16/2021 1454   ALT 12 10/16/2021 1454   BILITOT 0.3 10/16/2021 1454       Impression and Plan: [name] is a very pleasant 76 yo caucasian female with multifactorial anemia as well as IgG kappa MGUS.  Protein studies are pending.  No ESA give, Hgb 11.6.  Iron studies pending.  Lab and injection monthly.  Follow-up in 4 months.   Lottie Dawson, NP 8/8/20233:02 PM

## 2021-11-20 LAB — IRON AND IRON BINDING CAPACITY (CC-WL,HP ONLY)
Iron: 54 ug/dL (ref 28–170)
Saturation Ratios: 27 % (ref 10.4–31.8)
TIBC: 200 ug/dL — ABNORMAL LOW (ref 250–450)
UIBC: 146 ug/dL — ABNORMAL LOW (ref 148–442)

## 2021-11-20 LAB — KAPPA/LAMBDA LIGHT CHAINS
Kappa free light chain: 50.6 mg/L — ABNORMAL HIGH (ref 3.3–19.4)
Kappa, lambda light chain ratio: 1.42 (ref 0.26–1.65)
Lambda free light chains: 35.7 mg/L — ABNORMAL HIGH (ref 5.7–26.3)

## 2021-11-20 LAB — IGG, IGA, IGM
IgA: 400 mg/dL (ref 64–422)
IgG (Immunoglobin G), Serum: 1770 mg/dL — ABNORMAL HIGH (ref 586–1602)
IgM (Immunoglobulin M), Srm: 103 mg/dL (ref 26–217)

## 2021-11-21 LAB — PROTEIN ELECTROPHORESIS, SERUM
A/G Ratio: 0.9 (ref 0.7–1.7)
Albumin ELP: 3.5 g/dL (ref 2.9–4.4)
Alpha-1-Globulin: 0.2 g/dL (ref 0.0–0.4)
Alpha-2-Globulin: 0.8 g/dL (ref 0.4–1.0)
Beta Globulin: 1 g/dL (ref 0.7–1.3)
Gamma Globulin: 1.9 g/dL — ABNORMAL HIGH (ref 0.4–1.8)
Globulin, Total: 3.8 g/dL (ref 2.2–3.9)
M-Spike, %: 0.9 g/dL — ABNORMAL HIGH
Total Protein ELP: 7.3 g/dL (ref 6.0–8.5)

## 2021-12-18 ENCOUNTER — Inpatient Hospital Stay: Payer: Medicare Other

## 2021-12-18 ENCOUNTER — Inpatient Hospital Stay: Payer: Medicare Other | Attending: Hematology & Oncology

## 2021-12-18 DIAGNOSIS — D631 Anemia in chronic kidney disease: Secondary | ICD-10-CM | POA: Diagnosis not present

## 2021-12-18 DIAGNOSIS — N189 Chronic kidney disease, unspecified: Secondary | ICD-10-CM | POA: Diagnosis present

## 2021-12-18 DIAGNOSIS — D472 Monoclonal gammopathy: Secondary | ICD-10-CM

## 2021-12-18 DIAGNOSIS — D500 Iron deficiency anemia secondary to blood loss (chronic): Secondary | ICD-10-CM

## 2021-12-18 LAB — CBC WITH DIFFERENTIAL (CANCER CENTER ONLY)
Abs Immature Granulocytes: 0.02 10*3/uL (ref 0.00–0.07)
Basophils Absolute: 0.1 10*3/uL (ref 0.0–0.1)
Basophils Relative: 1 %
Eosinophils Absolute: 0.3 10*3/uL (ref 0.0–0.5)
Eosinophils Relative: 4 %
HCT: 32.8 % — ABNORMAL LOW (ref 36.0–46.0)
Hemoglobin: 11.1 g/dL — ABNORMAL LOW (ref 12.0–15.0)
Immature Granulocytes: 0 %
Lymphocytes Relative: 24 %
Lymphs Abs: 1.7 10*3/uL (ref 0.7–4.0)
MCH: 30.5 pg (ref 26.0–34.0)
MCHC: 33.8 g/dL (ref 30.0–36.0)
MCV: 90.1 fL (ref 80.0–100.0)
Monocytes Absolute: 0.9 10*3/uL (ref 0.1–1.0)
Monocytes Relative: 12 %
Neutro Abs: 4.2 10*3/uL (ref 1.7–7.7)
Neutrophils Relative %: 59 %
Platelet Count: 284 10*3/uL (ref 150–400)
RBC: 3.64 MIL/uL — ABNORMAL LOW (ref 3.87–5.11)
RDW: 13.3 % (ref 11.5–15.5)
WBC Count: 7.2 10*3/uL (ref 4.0–10.5)
nRBC: 0 % (ref 0.0–0.2)

## 2021-12-18 LAB — CMP (CANCER CENTER ONLY)
ALT: 9 U/L (ref 0–44)
AST: 17 U/L (ref 15–41)
Albumin: 3.7 g/dL (ref 3.5–5.0)
Alkaline Phosphatase: 67 U/L (ref 38–126)
Anion gap: 8 (ref 5–15)
BUN: 16 mg/dL (ref 8–23)
CO2: 25 mmol/L (ref 22–32)
Calcium: 9.1 mg/dL (ref 8.9–10.3)
Chloride: 106 mmol/L (ref 98–111)
Creatinine: 0.84 mg/dL (ref 0.44–1.00)
GFR, Estimated: >60 mL/min (ref >60)
Glucose, Bld: 109 mg/dL — ABNORMAL HIGH (ref 70–99)
Potassium: 3.5 mmol/L (ref 3.5–5.1)
Sodium: 139 mmol/L (ref 135–145)
Total Bilirubin: 0.4 mg/dL (ref 0.3–1.2)
Total Protein: 7.5 g/dL (ref 6.5–8.1)

## 2021-12-20 ENCOUNTER — Other Ambulatory Visit: Payer: Medicare Other

## 2021-12-20 ENCOUNTER — Ambulatory Visit: Payer: Medicare Other

## 2022-01-21 ENCOUNTER — Other Ambulatory Visit: Payer: Self-pay | Admitting: Obstetrics and Gynecology

## 2022-01-21 ENCOUNTER — Inpatient Hospital Stay: Payer: Medicare Other

## 2022-01-21 DIAGNOSIS — Z1231 Encounter for screening mammogram for malignant neoplasm of breast: Secondary | ICD-10-CM

## 2022-01-28 ENCOUNTER — Inpatient Hospital Stay: Payer: Medicare Other | Attending: Hematology & Oncology

## 2022-01-28 ENCOUNTER — Inpatient Hospital Stay: Payer: Medicare Other

## 2022-01-28 DIAGNOSIS — D500 Iron deficiency anemia secondary to blood loss (chronic): Secondary | ICD-10-CM

## 2022-01-28 DIAGNOSIS — D631 Anemia in chronic kidney disease: Secondary | ICD-10-CM | POA: Insufficient documentation

## 2022-01-28 DIAGNOSIS — N189 Chronic kidney disease, unspecified: Secondary | ICD-10-CM | POA: Diagnosis not present

## 2022-01-28 DIAGNOSIS — D472 Monoclonal gammopathy: Secondary | ICD-10-CM

## 2022-01-28 LAB — CMP (CANCER CENTER ONLY)
ALT: 13 U/L (ref 0–44)
AST: 26 U/L (ref 15–41)
Albumin: 3.5 g/dL (ref 3.5–5.0)
Alkaline Phosphatase: 79 U/L (ref 38–126)
Anion gap: 8 (ref 5–15)
BUN: 22 mg/dL (ref 8–23)
CO2: 23 mmol/L (ref 22–32)
Calcium: 8.2 mg/dL — ABNORMAL LOW (ref 8.9–10.3)
Chloride: 106 mmol/L (ref 98–111)
Creatinine: 0.87 mg/dL (ref 0.44–1.00)
GFR, Estimated: >60 mL/min (ref >60)
Glucose, Bld: 113 mg/dL — ABNORMAL HIGH (ref 70–99)
Potassium: 4.1 mmol/L (ref 3.5–5.1)
Sodium: 137 mmol/L (ref 135–145)
Total Bilirubin: 0.3 mg/dL (ref 0.3–1.2)
Total Protein: 7.5 g/dL (ref 6.5–8.1)

## 2022-01-28 LAB — CBC WITH DIFFERENTIAL (CANCER CENTER ONLY)
Abs Immature Granulocytes: 0.02 10*3/uL (ref 0.00–0.07)
Basophils Absolute: 0 10*3/uL (ref 0.0–0.1)
Basophils Relative: 0 %
Eosinophils Absolute: 0.4 10*3/uL (ref 0.0–0.5)
Eosinophils Relative: 4 %
HCT: 34.8 % — ABNORMAL LOW (ref 36.0–46.0)
Hemoglobin: 11.6 g/dL — ABNORMAL LOW (ref 12.0–15.0)
Immature Granulocytes: 0 %
Lymphocytes Relative: 23 %
Lymphs Abs: 2.2 10*3/uL (ref 0.7–4.0)
MCH: 30.1 pg (ref 26.0–34.0)
MCHC: 33.3 g/dL (ref 30.0–36.0)
MCV: 90.4 fL (ref 80.0–100.0)
Monocytes Absolute: 1.3 10*3/uL — ABNORMAL HIGH (ref 0.1–1.0)
Monocytes Relative: 14 %
Neutro Abs: 5.6 10*3/uL (ref 1.7–7.7)
Neutrophils Relative %: 59 %
Platelet Count: 243 10*3/uL (ref 150–400)
RBC: 3.85 MIL/uL — ABNORMAL LOW (ref 3.87–5.11)
RDW: 13.2 % (ref 11.5–15.5)
WBC Count: 9.5 10*3/uL (ref 4.0–10.5)
nRBC: 0 % (ref 0.0–0.2)

## 2022-02-01 ENCOUNTER — Inpatient Hospital Stay (HOSPITAL_COMMUNITY)
Admission: EM | Admit: 2022-02-01 | Discharge: 2022-02-06 | DRG: 481 | Disposition: A | Discharge status: Skilled Nursing Facility | Payer: Medicare Other | Attending: Internal Medicine | Admitting: Internal Medicine

## 2022-02-01 ENCOUNTER — Emergency Department (HOSPITAL_COMMUNITY): Payer: Medicare Other

## 2022-02-01 ENCOUNTER — Other Ambulatory Visit: Payer: Self-pay

## 2022-02-01 ENCOUNTER — Encounter (HOSPITAL_COMMUNITY): Payer: Self-pay

## 2022-02-01 DIAGNOSIS — S7291XA Unspecified fracture of right femur, initial encounter for closed fracture: Secondary | ICD-10-CM | POA: Diagnosis present

## 2022-02-01 DIAGNOSIS — Z96643 Presence of artificial hip joint, bilateral: Secondary | ICD-10-CM | POA: Diagnosis present

## 2022-02-01 DIAGNOSIS — Y92009 Unspecified place in unspecified non-institutional (private) residence as the place of occurrence of the external cause: Secondary | ICD-10-CM

## 2022-02-01 DIAGNOSIS — R296 Repeated falls: Secondary | ICD-10-CM | POA: Diagnosis present

## 2022-02-01 DIAGNOSIS — I129 Hypertensive chronic kidney disease with stage 1 through stage 4 chronic kidney disease, or unspecified chronic kidney disease: Secondary | ICD-10-CM | POA: Diagnosis present

## 2022-02-01 DIAGNOSIS — R55 Syncope and collapse: Secondary | ICD-10-CM | POA: Diagnosis not present

## 2022-02-01 DIAGNOSIS — F419 Anxiety disorder, unspecified: Secondary | ICD-10-CM | POA: Diagnosis present

## 2022-02-01 DIAGNOSIS — I951 Orthostatic hypotension: Secondary | ICD-10-CM | POA: Diagnosis present

## 2022-02-01 DIAGNOSIS — E538 Deficiency of other specified B group vitamins: Secondary | ICD-10-CM | POA: Diagnosis present

## 2022-02-01 DIAGNOSIS — W1830XA Fall on same level, unspecified, initial encounter: Secondary | ICD-10-CM | POA: Diagnosis present

## 2022-02-01 DIAGNOSIS — Z96651 Presence of right artificial knee joint: Secondary | ICD-10-CM | POA: Diagnosis present

## 2022-02-01 DIAGNOSIS — Z96612 Presence of left artificial shoulder joint: Secondary | ICD-10-CM | POA: Diagnosis present

## 2022-02-01 DIAGNOSIS — D72829 Elevated white blood cell count, unspecified: Secondary | ICD-10-CM | POA: Diagnosis not present

## 2022-02-01 DIAGNOSIS — D62 Acute posthemorrhagic anemia: Secondary | ICD-10-CM | POA: Diagnosis not present

## 2022-02-01 DIAGNOSIS — Z888 Allergy status to other drugs, medicaments and biological substances status: Secondary | ICD-10-CM | POA: Diagnosis not present

## 2022-02-01 DIAGNOSIS — N1831 Chronic kidney disease, stage 3a: Secondary | ICD-10-CM | POA: Diagnosis present

## 2022-02-01 DIAGNOSIS — S72401A Unspecified fracture of lower end of right femur, initial encounter for closed fracture: Secondary | ICD-10-CM | POA: Diagnosis present

## 2022-02-01 DIAGNOSIS — M069 Rheumatoid arthritis, unspecified: Secondary | ICD-10-CM | POA: Diagnosis present

## 2022-02-01 DIAGNOSIS — D631 Anemia in chronic kidney disease: Secondary | ICD-10-CM | POA: Diagnosis present

## 2022-02-01 DIAGNOSIS — E860 Dehydration: Secondary | ICD-10-CM | POA: Diagnosis present

## 2022-02-01 DIAGNOSIS — F32A Depression, unspecified: Secondary | ICD-10-CM | POA: Diagnosis present

## 2022-02-01 DIAGNOSIS — Z79899 Other long term (current) drug therapy: Secondary | ICD-10-CM | POA: Diagnosis not present

## 2022-02-01 DIAGNOSIS — R269 Unspecified abnormalities of gait and mobility: Secondary | ICD-10-CM | POA: Diagnosis present

## 2022-02-01 DIAGNOSIS — D649 Anemia, unspecified: Secondary | ICD-10-CM | POA: Diagnosis not present

## 2022-02-01 DIAGNOSIS — W19XXXA Unspecified fall, initial encounter: Principal | ICD-10-CM

## 2022-02-01 DIAGNOSIS — M1990 Unspecified osteoarthritis, unspecified site: Secondary | ICD-10-CM | POA: Diagnosis not present

## 2022-02-01 DIAGNOSIS — I10 Essential (primary) hypertension: Secondary | ICD-10-CM | POA: Diagnosis not present

## 2022-02-01 DIAGNOSIS — M9711XA Periprosthetic fracture around internal prosthetic right knee joint, initial encounter: Secondary | ICD-10-CM | POA: Diagnosis present

## 2022-02-01 DIAGNOSIS — S72444A Nondisplaced fracture of lower epiphysis (separation) of right femur, initial encounter for closed fracture: Secondary | ICD-10-CM | POA: Diagnosis not present

## 2022-02-01 LAB — COMPREHENSIVE METABOLIC PANEL
ALT: 12 U/L (ref 0–44)
AST: 27 U/L (ref 15–41)
Albumin: 3.4 g/dL — ABNORMAL LOW (ref 3.5–5.0)
Alkaline Phosphatase: 73 U/L (ref 38–126)
Anion gap: 7 (ref 5–15)
BUN: 22 mg/dL (ref 8–23)
CO2: 22 mmol/L (ref 22–32)
Calcium: 8.2 mg/dL — ABNORMAL LOW (ref 8.9–10.3)
Chloride: 106 mmol/L (ref 98–111)
Creatinine, Ser: 0.97 mg/dL (ref 0.44–1.00)
GFR, Estimated: >60 mL/min (ref >60)
Glucose, Bld: 109 mg/dL — ABNORMAL HIGH (ref 70–99)
Potassium: 4.2 mmol/L (ref 3.5–5.1)
Sodium: 135 mmol/L (ref 135–145)
Total Bilirubin: 0.9 mg/dL (ref 0.3–1.2)
Total Protein: 7.2 g/dL (ref 6.5–8.1)

## 2022-02-01 LAB — URINALYSIS, ROUTINE W REFLEX MICROSCOPIC
Bilirubin Urine: NEGATIVE
Glucose, UA: NEGATIVE mg/dL
Hgb urine dipstick: NEGATIVE
Ketones, ur: NEGATIVE mg/dL
Leukocytes,Ua: NEGATIVE
Nitrite: NEGATIVE
Protein, ur: NEGATIVE mg/dL
Specific Gravity, Urine: 1.01 (ref 1.005–1.030)
pH: 7.5 (ref 5.0–8.0)

## 2022-02-01 LAB — CBC WITH DIFFERENTIAL/PLATELET
Abs Immature Granulocytes: 0.04 10*3/uL (ref 0.00–0.07)
Basophils Absolute: 0 10*3/uL (ref 0.0–0.1)
Basophils Relative: 0 %
Eosinophils Absolute: 0.1 10*3/uL (ref 0.0–0.5)
Eosinophils Relative: 1 %
HCT: 36.4 % (ref 36.0–46.0)
Hemoglobin: 12 g/dL (ref 12.0–15.0)
Immature Granulocytes: 0 %
Lymphocytes Relative: 10 %
Lymphs Abs: 1.7 10*3/uL (ref 0.7–4.0)
MCH: 29.9 pg (ref 26.0–34.0)
MCHC: 33 g/dL (ref 30.0–36.0)
MCV: 90.5 fL (ref 80.0–100.0)
Monocytes Absolute: 1.3 10*3/uL — ABNORMAL HIGH (ref 0.1–1.0)
Monocytes Relative: 7 %
Neutro Abs: 13.9 10*3/uL — ABNORMAL HIGH (ref 1.7–7.7)
Neutrophils Relative %: 82 %
Platelets: 269 10*3/uL (ref 150–400)
RBC: 4.02 MIL/uL (ref 3.87–5.11)
RDW: 13.2 % (ref 11.5–15.5)
WBC: 17 10*3/uL — ABNORMAL HIGH (ref 4.0–10.5)
nRBC: 0 % (ref 0.0–0.2)

## 2022-02-01 MED ORDER — FENTANYL CITRATE PF 50 MCG/ML IJ SOSY
25.0000 ug | PREFILLED_SYRINGE | Freq: Once | INTRAMUSCULAR | Status: AC
  Administered 2022-02-01: 25 ug via INTRAVENOUS
  Filled 2022-02-01: qty 1

## 2022-02-01 MED ORDER — FENTANYL CITRATE PF 50 MCG/ML IJ SOSY
50.0000 ug | PREFILLED_SYRINGE | Freq: Once | INTRAMUSCULAR | Status: AC
  Administered 2022-02-01: 50 ug via INTRAVENOUS
  Filled 2022-02-01: qty 1

## 2022-02-01 MED ORDER — ONDANSETRON 4 MG PO TBDP
4.0000 mg | ORAL_TABLET | Freq: Once | ORAL | Status: AC
  Administered 2022-02-01: 4 mg via ORAL
  Filled 2022-02-01: qty 1

## 2022-02-01 MED ORDER — HYDROMORPHONE HCL 1 MG/ML IJ SOLN
1.0000 mg | INTRAMUSCULAR | Status: DC | PRN
  Administered 2022-02-01 – 2022-02-02 (×3): 1 mg via INTRAVENOUS
  Filled 2022-02-01 (×3): qty 1

## 2022-02-01 MED ORDER — SODIUM CHLORIDE 0.9 % IV BOLUS
500.0000 mL | Freq: Once | INTRAVENOUS | Status: AC
  Administered 2022-02-01: 500 mL via INTRAVENOUS

## 2022-02-01 MED ORDER — DIPHENHYDRAMINE HCL 25 MG PO CAPS
25.0000 mg | ORAL_CAPSULE | ORAL | Status: DC | PRN
  Administered 2022-02-01 – 2022-02-02 (×4): 25 mg via ORAL
  Filled 2022-02-01 (×4): qty 1

## 2022-02-01 MED ORDER — PROCHLORPERAZINE EDISYLATE 10 MG/2ML IJ SOLN
5.0000 mg | Freq: Four times a day (QID) | INTRAMUSCULAR | Status: DC | PRN
  Administered 2022-02-03 – 2022-02-06 (×2): 5 mg via INTRAVENOUS
  Filled 2022-02-01 (×2): qty 2

## 2022-02-01 NOTE — Consult Note (Addendum)
Reason for Consult:R knee pain, distal femoral periprosthetic fx Referring Physician: Dorise Bullion PA-C  Mallory Murphy is an 76 y.o. female.  HPI: Pt with prior hx of TKA R knee 2018 by Dr. Wynelle Link. Syncopal episode today and fell onto R knee. Presented to ER with pain and deformity RLE patient may have had a syncopal event and fell onto her right side.  Was admitted here to the emergency room where x-rays of the knee were taken showing a periprosthetic fracture.  X-rays of the hip was unremarkable.  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 No pertinent family history.  Social History:  reports that she has never smoked. She has never used smokeless tobacco. She reports that she does not currently use alcohol. She reports that she does not use drugs.  Allergies:  Allergies  Allergen Reactions   Macrobid [Nitrofurantoin Macrocrystal] Nausea Only    Medications: I have reviewed the patient's current medications.  Results for orders placed or performed during the hospital encounter of 02/01/22 (from the past 48 hour(s))  CBC with Differential     Status: Abnormal   Collection Time: 02/01/22  4:41 PM  Result  Value Ref Range   WBC 17.0 (H) 4.0 - 10.5 K/uL   RBC 4.02 3.87 - 5.11 MIL/uL   Hemoglobin 12.0 12.0 - 15.0 g/dL   HCT 36.4 36.0 - 46.0 %   MCV 90.5 80.0 - 100.0 fL   MCH 29.9 26.0 - 34.0 pg   MCHC 33.0 30.0 - 36.0 g/dL   RDW 13.2 11.5 - 15.5 %   Platelets 269 150 - 400 K/uL   nRBC 0.0 0.0 - 0.2 %   Neutrophils Relative % 82 %   Neutro Abs 13.9 (H) 1.7 - 7.7 K/uL   Lymphocytes Relative 10 %   Lymphs Abs 1.7 0.7 - 4.0 K/uL   Monocytes Relative 7 %   Monocytes Absolute 1.3 (H) 0.1 - 1.0 K/uL   Eosinophils Relative 1 %   Eosinophils Absolute 0.1 0.0 - 0.5 K/uL   Basophils Relative 0 %   Basophils Absolute 0.0 0.0 - 0.1 K/uL   Immature Granulocytes 0 %   Abs Immature Granulocytes 0.04 0.00 - 0.07 K/uL    Comment: Performed at [facility], [place] [address] [place], [place] [address]  Comprehensive metabolic panel     Status: Abnormal   Collection Time: 02/01/22  5:56 PM  Result Value Ref Range   Sodium 135 135 - 145 mmol/L   Potassium 4.2 3.5 - 5.1 mmol/L    Comment: HEMOLYSIS AT THIS LEVEL MAY AFFECT RESULT   Chloride  106 98 - 111 mmol/L   CO2 22 22 - 32 mmol/L   Glucose, Bld 109 (H) 70 - 99 mg/dL    Comment: Glucose reference range applies only to samples taken after fasting for at least 8 hours.   BUN 22 8 - 23 mg/dL   Creatinine, Ser 0.97 0.44 - 1.00 mg/dL   Calcium 8.2 (L) 8.9 - 10.3 mg/dL   Total Protein 7.2 6.5 - 8.1 g/dL   Albumin 3.4 (L) 3.5 - 5.0 g/dL   AST 27 15 - 41 U/L    Comment: HEMOLYSIS AT THIS LEVEL MAY AFFECT RESULT   ALT 12 0 - 44 U/L    Comment: HEMOLYSIS AT THIS LEVEL MAY AFFECT RESULT   Alkaline Phosphatase 73 38 - 126 U/L   Total Bilirubin 0.9 0.3 - 1.2 mg/dL    Comment: HEMOLYSIS AT THIS LEVEL MAY AFFECT RESULT   GFR, Estimated >60 >60 mL/min    Comment: (NOTE) Calculated using the CKD-EPI Creatinine Equation (2021)    Anion gap 7 5 - 15    Comment: Performed at [facility], [place] [address] [place],  [place] [address]    DG Hip Malvin Johns or Wo Pelvis 2-3 Views Right  Result Date: 02/01/2022 CLINICAL DATA:  Fall. EXAM: DG HIP (WITH OR WITHOUT PELVIS) 2-3V RIGHT COMPARISON:  None Available. FINDINGS: Bilateral total hip arthroplasties. The arthroplasty components appear intact and in anatomic alignment. There is no acute fracture or dislocation. The bones are osteopenic. Degenerative changes of the lower lumbar spine. The soft tissues are unremarkable. IMPRESSION: 1. No acute fracture or dislocation. 2. Bilateral total hip arthroplasties. Electronically Signed   By: Anner Crete M.D.   On: 02/01/2022 18:13   DG Knee 2 Views Right  Result Date: 02/01/2022 CLINICAL DATA:  Right knee pain after fall. EXAM: RIGHT KNEE - 1-2 VIEW COMPARISON:  None Available. FINDINGS: Status post right total knee arthroplasty. Severely angulated fracture is seen involving the distal right femoral shaft just above the prosthesis. IMPRESSION: Severely angulated fracture is seen involving the distal right femoral shaft just above prosthesis. Electronically Signed   By: Marijo Conception M.D.   On: 02/01/2022 16:20    Review of Systems  Constitutional: Negative.   HENT: Negative.    Eyes: Negative.   Respiratory: Negative.    Cardiovascular: Negative.   Gastrointestinal: Negative.   Endocrine: Negative.   Genitourinary: Negative.   Musculoskeletal:  Positive for arthralgias, gait problem, joint swelling and myalgias.       The right lower extremity is flexed at the knee.  There is some soft tissue swelling about the knee.  Skin is intact.  She has good dorsiflexion plantarflexion.  Sensation is intact.  Dorsalis pedis and posterior tibial pulses are intact.  Compartments are soft.  Abdomen is soft.  Pelvis is stable.  Nontender over the cervical spine thoracic spine.  No pain palpation of the shoulders.  Skin: Negative.   Allergic/Immunologic: Negative.    Blood pressure (!) 173/98, pulse (!) 112, temperature 98 F (36.7  C), temperature source Oral, resp. rate 18, height '5\' 1"'$  (1.549 m), weight 61.7 kg, SpO2 100 %. Physical Exam Constitutional:      General: She is in acute distress.  HENT:     Head: Normocephalic and atraumatic.     Right Ear: External ear normal.     Left Ear: External ear normal.     Nose: Nose normal.     Mouth/Throat:  Pharynx: Oropharynx is clear.  Eyes:     Conjunctiva/sclera: Conjunctivae normal.  Cardiovascular:     Rate and Rhythm: Normal rate.     Pulses: Normal pulses.     Heart sounds: Normal heart sounds.  Pulmonary:     Effort: Pulmonary effort is normal.  Abdominal:     General: Bowel sounds are normal.  Musculoskeletal:        General: Deformity present.     Cervical back: Normal range of motion.  Skin:    General: Skin is warm.  Neurological:     Mental Status: She is alert.   Xrays with distal femur fx periprosthetic (total knee) Patient is a periprosthetic fracture supracondylar with posterior angulation on the AP there is comminution with a fracture migrating proximally.  Assessment/Plan: R distal femur periprosthetic fx (prior hx TKA in 2018). History of syncopal events and blood pressure instability  Admit to medicine service Dr Tonita Cong to see in ER - plan closed reduction, immobilizer Optimize for surgery, timing to be determined  I discussed with patient and her niece to move the patient onto the emergency room bed that we would need to stabilize the knee through a gentle closed reduction and then placement into a knee immobilizer.  This was performed after 25 mg of IV fentanyl was given.  And very gentle longitudinal traction was applied to the foot and ankle.  With reduction appreciated.  It was well-tolerated.  She was placed in a knee immobilizer following this. Patient good pulses following the injection and placement into the knee immobilizer.  She has good dorsiflexion plantarflexion of the foot. Consent was obtained. Post reduction x-rays  are pending.  Discussed with the patient that I would attempt to contact Dr. Ricki Rodriguez to discuss timing on her surgery which would require open reduction internal fixation. Patient is currently comfortable.  Alert and oriented.  Cecilie Kicks PA-C for Dr Tonita Cong 02/01/2022, 8:10 PM   Dr. Tonita Cong [phone]

## 2022-02-01 NOTE — ED Triage Notes (Signed)
Per EMS- Patient was standing in her kitchen and had a syncopal episode and landed on her right knee. Patient reports a history of a right knee replacement < than 3 years ago.

## 2022-02-01 NOTE — ED Provider Notes (Signed)
[facility] DEPT Provider Note   CSN: [id number] Arrival date & time: 02/01/22  1510     History {Add pertinent medical, surgical, social history, OB history to HPI:1} Chief Complaint  Patient presents with   Loss of Consciousness   Knee Pain    Mallory Murphy is a 76 y.o. female.  Patient with history of erythropoietin deficiency anemia, hypertension, dehydration, and vasovagal syncope presents today with complaints of syncopal episode and right knee and hip pain. She states that earlier today she was standing in her kitchen and suddenly got hot and sweaty and proceeded to have a syncopal episode. She denies hitting her head. She is not anticoagulated. She states that she frequently has syncopal episodes and even walks with a rollator in public so she has somewhere to sit when she has these episodes. States that this has been attributed to her chronic anemia, orthostatic hypotension, and dehydration. States that she did drink a boost today. She states that this syncopal episode was similar to prior. States she hit her knee on the ground and has had significant pain to same since and has not been able to walk. Also endorses right hip pain. Of note, patient did have a total right knee replacement with Dr. Gaynelle Arabian with [facility] in 2018. Additionally, patient does live at home alone.  The history is provided by the patient. No language interpreter was used.  Loss of Consciousness Knee Pain      Home Medications Prior to Admission medications   Medication Sig Start Date End Date Taking? Authorizing Provider  acetaminophen (TYLENOL) 500 MG tablet Take 500-1,000 mg by mouth every 6 (six) hours as needed for mild pain, fever or headache.    [provider]  calcitonin, salmon, (MIACALCIN/FORTICAL) 200 UNIT/ACT nasal spray  07/06/20   [provider]  diphenhydrAMINE (BENADRYL) 25 MG tablet Take 25-50 mg by mouth at bedtime.     [provider]  escitalopram (LEXAPRO) 10 MG tablet Take 10 mg by mouth daily.    [provider]  Golimumab ([place] ARIA IV) Inject into the vein every 8 (eight) weeks. For RA [facility] Rhematology.    [provider]  losartan (COZAAR) 100 MG tablet Take 100 mg by mouth daily. 01/01/21   [provider]  losartan-hydrochlorothiazide (HYZAAR) 50-12.5 MG tablet Take 1 tablet by mouth daily.    [provider]  MELATONIN PO Take by mouth at bedtime as needed.    [provider]  oxybutynin (DITROPAN) 5 MG tablet Take 5 mg by mouth daily. 02/27/20   [provider]      Allergies    Macrobid [nitrofurantoin macrocrystal]    Review of Systems   Review of Systems  Cardiovascular:  Positive for syncope.  Musculoskeletal:  Positive for arthralgias.  All other systems reviewed and are negative.   Physical Exam Updated Vital Signs BP (!) 158/100   Pulse 93   Temp 98 F (36.7 C) (Oral)   Resp 17   Ht '5\' 1"'$  (1.549 m)   Wt 61.7 kg   SpO2 99%   BMI 25.70 kg/m  Physical Exam Vitals and nursing note reviewed.  Constitutional:      General: She is not in acute distress.    Appearance: Normal appearance. She is normal weight. She is not ill-appearing, toxic-appearing or diaphoretic.  HENT:     Head: Normocephalic and atraumatic.     Comments: No battle sign or raccoon eyes Eyes:  Extraocular Movements: Extraocular movements intact.     Pupils: Pupils are equal, round, and reactive to light.  Cardiovascular:     Rate and Rhythm: Normal rate and regular rhythm.     Heart sounds: Normal heart sounds.  Pulmonary:     Effort: Pulmonary effort is normal. No respiratory distress.     Breath sounds: Normal breath sounds.  Abdominal:     General: Abdomen is flat.     Palpations: Abdomen is soft.  Musculoskeletal:        General: Normal range of motion.     Cervical back: Normal range of motion.     Comments: No  tenderness to palpation of cervical, thoracic, or lumbar spine.  No step-offs, overlying skin changes, or deformity.  Tenderness to palpation of the right knee with obvious deformity. DP and PT pulses intact and 2+.   Tenderness to palpation over the right hip without obvious deformity.   Skin:    General: Skin is warm and dry.  Neurological:     General: No focal deficit present.     Mental Status: She is alert and oriented to person, place, and time.     Sensory: No sensory deficit.  Psychiatric:        Mood and Affect: Mood normal.        Behavior: Behavior normal.     ED Results / Procedures / Treatments   Labs (all labs ordered are listed, but only abnormal results are displayed) Labs Reviewed  CBC WITH DIFFERENTIAL/PLATELET - Abnormal; Notable for the following components:      Result Value   WBC 17.0 (*)    Neutro Abs 13.9 (*)    Monocytes Absolute 1.3 (*)    All other components within normal limits  URINALYSIS, ROUTINE W REFLEX MICROSCOPIC  COMPREHENSIVE METABOLIC PANEL    EKG None  Radiology DG Knee 2 Views Right  Result Date: 02/01/2022 CLINICAL DATA:  Right knee pain after fall. EXAM: RIGHT KNEE - 1-2 VIEW COMPARISON:  None Available. FINDINGS: Status post right total knee arthroplasty. Severely angulated fracture is seen involving the distal right femoral shaft just above the prosthesis. IMPRESSION: Severely angulated fracture is seen involving the distal right femoral shaft just above prosthesis. Electronically Signed   By: Marijo Conception M.D.   On: 02/01/2022 16:20    Procedures Procedures  {Document cardiac monitor, telemetry assessment procedure when appropriate:1}  Medications Ordered in ED Medications  fentaNYL (SUBLIMAZE) injection 50 mcg (50 mcg Intravenous Given 02/01/22 1717)  sodium chloride 0.9 % bolus 500 mL (500 mLs Intravenous New Bag/Given 02/01/22 1715)    ED Course/ Medical Decision Making/ A&P                           Medical  Decision Making Amount and/or Complexity of Data Reviewed Labs: ordered.   This patient is a 76 y.o. female  who presents to the ED for concern of syncope and right knee injury.   The differential for syncope is extensive and includes, but is not limited to: arrythmia (Vtach, SVT, SSS, sinus arrest, AV block, bradycardia) aortic stenosis, AMI, HOCM, PE, atrial myxoma, pulmonary hypertension, orthostatic hypotension, (hypovolemia, drug effect, GB syndrome, micturition, cough, swall) carotid sinus sensitivity, Seizure, TIA/CVA, hypoglycemia,  Vertigo.  Past Medical History / Co-morbidities: Hx anemia, orthostatic hypotension, and dehydration  Additional history: Chart reviewed. Pertinent results include: patient with documented history of vasovagal syncope previously.   Physical Exam: Physical  exam performed. The pertinent findings include: tenderness and deformity to right knee. Tenderness to right hip. DP and PT pulses intact and 2+  Lab Tests/Imaging studies: I personally interpreted labs/imaging and the pertinent results include:  WBC 17 likely reactive due to trauma. BMP pending.  DG right knee shows severely angulated periprosthetic fracture. I agree with the radiologist interpretation.  DG right hip pending at shift change  Cardiac monitoring: EKG obtained and interpreted by my attending physician which shows: no STEMI   Medications: I ordered medication including fentanyl for pain and fluids for dehydration.  I have reviewed the patients home medicines and have made adjustments as needed.   Disposition: Patient presents today with syncopal episode and fall. She has a knee fracture that will need operative intervention and admission. Given patients history of frequent syncopal episodes previously, lower suspicion for other emergent pathology related to this. Labs and hip x-ray pending at shift change.   Care handoff to Dorise Bullion, PA-C at shift change. Please see their note  for further information and dispo.   Final Clinical Impression(s) / ED Diagnoses Final diagnoses:  None    Rx / DC Orders ED Discharge Orders     None

## 2022-02-01 NOTE — ED Provider Notes (Signed)
Physical Exam  BP (!) 158/100   Pulse 93   Temp 98 F (36.7 C) (Oral)   Resp 17   Ht '5\' 1"'$  (1.549 m)   Wt 61.7 kg   SpO2 99%   BMI 25.70 kg/m   Physical Exam Vitals and nursing note reviewed.  Constitutional:      General: She is not in acute distress.    Appearance: She is not ill-appearing, toxic-appearing or diaphoretic.     Comments: Appears overall comfortable, reports mild-moderate pain of right upper leg near area of deformity.  HENT:     Head: Normocephalic and atraumatic.  Eyes:     Conjunctiva/sclera: Conjunctivae normal.  Cardiovascular:     Rate and Rhythm: Normal rate.     Pulses: Normal pulses.     Comments: 2+ DP and PT pulses bilaterally.  No evidence of ischemia to limb.  Compartments soft.  Lower extremities appear neurovascularly intact. Pulmonary:     Effort: Pulmonary effort is normal. No respiratory distress.  Abdominal:     General: There is no distension.     Palpations: Abdomen is soft.     Tenderness: There is no abdominal tenderness.  Musculoskeletal:        General: Tenderness, deformity and signs of injury present.     Cervical back: Neck supple.     Comments: Injury appears closed.  Skin:    General: Skin is warm and dry.  Neurological:     Mental Status: She is alert and oriented to person, place, and time. Mental status is at baseline.  Psychiatric:        Mood and Affect: Mood normal.     Procedures  Procedures  ED Course / MDM    Medical Decision Making Amount and/or Complexity of Data Reviewed Labs: ordered. Radiology: ordered.  Risk Prescription drug management. Decision regarding hospitalization.   Patient signed out to me at shift change.  Please see previous provider note for further details.  In short, this is a 76 year old female with recent syncopal episode in kitchen, preceded with diaphoresis and nausea.  Consistent with usual syncopal episodes per pt.  Pt less concerned with this, more concerned with right  knee and right hip pain sustained from fall.  X-ray imaging indicates severely angulated fracture of the distal right femoral shaft just above the prosthesis.  Prior right TKA 2018 with Dr. Wynelle Link of [facility].  Hx of anemia thought without evidence of this today.  XR right hip pending, after results plan to consult with Ortho.  Anticipate admission to medicine with ortho follow for likely surgical intervention.    Previous notes 02/06/20 indicate daily use of Losartan with added daily '5mg'$  Norvasc during hospital admission for BP management.  Unclear pt's current dosage of Losartan.  XR hip negative, consulted with Dr. Tonita Cong of orthopedics.  Discussed pt at length.  Recommended pt be moved to full room for likely reduction attempt with knee immobilizer placement.  See his note for details  Workup pertinent findings: --XR right knee: Severely angulated fracture is seen involving the distal right femoral shaft just above prosthesis --XR right hip/pelvis: Unremarkable  Medications: --NS 578m bolus --Fenatnyl 50 mcg + Fentanyl 25 mcg  Consultations --Dr. BTonita Congof orthopedics: Agrees with plan for admission to medicine and likely surgical intervention needed.  See note for details. --Dr. HNevada Craneof hospitalist group: Agrees with plan for admission, plans to accept patient.    Plan --Admission to medicine for fracture management and blood pressure management  I discussed this case with my attending physician Dr. Zenia Resides, who agreed with the proposed treatment course and cosigned this note including patient's presenting symptoms, physical exam, and planned diagnostics and interventions.  Attending physician stated agreement with plan or made changes to plan which were implemented.     This chart was dictated using voice recognition software.  Despite best efforts to proofread, errors can occur which can change the documentation meaning.      Candace Cruise 16/10/75 2307    Lacretia Leigh, MD 02/02/74 2227

## 2022-02-02 ENCOUNTER — Inpatient Hospital Stay (HOSPITAL_COMMUNITY): Payer: Medicare Other

## 2022-02-02 DIAGNOSIS — R55 Syncope and collapse: Secondary | ICD-10-CM

## 2022-02-02 DIAGNOSIS — N1831 Chronic kidney disease, stage 3a: Secondary | ICD-10-CM

## 2022-02-02 DIAGNOSIS — D649 Anemia, unspecified: Secondary | ICD-10-CM | POA: Diagnosis present

## 2022-02-02 DIAGNOSIS — D72829 Elevated white blood cell count, unspecified: Secondary | ICD-10-CM | POA: Diagnosis not present

## 2022-02-02 DIAGNOSIS — S72444A Nondisplaced fracture of lower epiphysis (separation) of right femur, initial encounter for closed fracture: Secondary | ICD-10-CM | POA: Diagnosis not present

## 2022-02-02 DIAGNOSIS — I951 Orthostatic hypotension: Secondary | ICD-10-CM

## 2022-02-02 LAB — CBC WITH DIFFERENTIAL/PLATELET
Abs Immature Granulocytes: 0.04 10*3/uL (ref 0.00–0.07)
Basophils Absolute: 0 10*3/uL (ref 0.0–0.1)
Basophils Relative: 0 %
Eosinophils Absolute: 0.1 10*3/uL (ref 0.0–0.5)
Eosinophils Relative: 0 %
HCT: 30.4 % — ABNORMAL LOW (ref 36.0–46.0)
Hemoglobin: 10 g/dL — ABNORMAL LOW (ref 12.0–15.0)
Immature Granulocytes: 0 %
Lymphocytes Relative: 17 %
Lymphs Abs: 2.2 10*3/uL (ref 0.7–4.0)
MCH: 30 pg (ref 26.0–34.0)
MCHC: 32.9 g/dL (ref 30.0–36.0)
MCV: 91.3 fL (ref 80.0–100.0)
Monocytes Absolute: 1.9 10*3/uL — ABNORMAL HIGH (ref 0.1–1.0)
Monocytes Relative: 14 %
Neutro Abs: 8.9 10*3/uL — ABNORMAL HIGH (ref 1.7–7.7)
Neutrophils Relative %: 69 %
Platelets: 223 10*3/uL (ref 150–400)
RBC: 3.33 MIL/uL — ABNORMAL LOW (ref 3.87–5.11)
RDW: 13.2 % (ref 11.5–15.5)
WBC: 13 10*3/uL — ABNORMAL HIGH (ref 4.0–10.5)
nRBC: 0 % (ref 0.0–0.2)

## 2022-02-02 LAB — COMPREHENSIVE METABOLIC PANEL
ALT: 13 U/L (ref 0–44)
AST: 23 U/L (ref 15–41)
Albumin: 3.2 g/dL — ABNORMAL LOW (ref 3.5–5.0)
Alkaline Phosphatase: 71 U/L (ref 38–126)
Anion gap: 8 (ref 5–15)
BUN: 24 mg/dL — ABNORMAL HIGH (ref 8–23)
CO2: 23 mmol/L (ref 22–32)
Calcium: 8.2 mg/dL — ABNORMAL LOW (ref 8.9–10.3)
Chloride: 106 mmol/L (ref 98–111)
Creatinine, Ser: 1.09 mg/dL — ABNORMAL HIGH (ref 0.44–1.00)
GFR, Estimated: 53 mL/min — ABNORMAL LOW (ref >60)
Glucose, Bld: 126 mg/dL — ABNORMAL HIGH (ref 70–99)
Potassium: 4 mmol/L (ref 3.5–5.1)
Sodium: 137 mmol/L (ref 135–145)
Total Bilirubin: 0.7 mg/dL (ref 0.3–1.2)
Total Protein: 6.6 g/dL (ref 6.5–8.1)

## 2022-02-02 LAB — ECHOCARDIOGRAM COMPLETE
AR max vel: 3.01 cm2
AV Area VTI: 3.16 cm2
AV Area mean vel: 3.19 cm2
AV Mean grad: 3 mmHg
AV Peak grad: 6 mmHg
Ao pk vel: 1.22 m/s
Area-P 1/2: 4.71 cm2
Calc EF: 50 %
Height: 61 in
P 1/2 time: 259 msec
S' Lateral: 3 cm
Single Plane A2C EF: 50.5 %
Single Plane A4C EF: 50.5 %
Weight: 2176 oz

## 2022-02-02 LAB — PHOSPHORUS: Phosphorus: 3.9 mg/dL (ref 2.5–4.6)

## 2022-02-02 LAB — SURGICAL PCR SCREEN
MRSA, PCR: NEGATIVE
Staphylococcus aureus: POSITIVE — AB

## 2022-02-02 LAB — MAGNESIUM: Magnesium: 1.8 mg/dL (ref 1.7–2.4)

## 2022-02-02 MED ORDER — POVIDONE-IODINE 10 % EX SWAB: 2.0000 | Freq: Once | CUTANEOUS | Status: DC

## 2022-02-02 MED ORDER — CHLORHEXIDINE GLUCONATE CLOTH 2 % EX PADS
6.0000 | MEDICATED_PAD | Freq: Every day | CUTANEOUS | Status: DC
  Administered 2022-02-02 – 2022-02-05 (×3): 6 via TOPICAL

## 2022-02-02 MED ORDER — ACETAMINOPHEN 325 MG PO TABS: 650.0000 mg | ORAL_TABLET | Freq: Four times a day (QID) | ORAL | Status: DC | PRN

## 2022-02-02 MED ORDER — SODIUM CHLORIDE 0.9 % IV SOLN: INTRAVENOUS | Status: DC

## 2022-02-02 MED ORDER — POLYETHYLENE GLYCOL 3350 17 G PO PACK
17.0000 g | PACK | Freq: Every day | ORAL | Status: DC | PRN
  Administered 2022-02-05: 17 g via ORAL
  Filled 2022-02-02: qty 1

## 2022-02-02 MED ORDER — ESCITALOPRAM OXALATE 10 MG PO TABS
10.0000 mg | ORAL_TABLET | Freq: Every day | ORAL | Status: DC
  Administered 2022-02-02 – 2022-02-05 (×5): 10 mg via ORAL
  Filled 2022-02-02 (×5): qty 1

## 2022-02-02 MED ORDER — TRANEXAMIC ACID-NACL 1000-0.7 MG/100ML-% IV SOLN
1000.0000 mg | INTRAVENOUS | Status: AC
  Administered 2022-02-03: 1000 mg via INTRAVENOUS
  Filled 2022-02-02: qty 100

## 2022-02-02 MED ORDER — HYDROMORPHONE HCL 1 MG/ML IJ SOLN
0.5000 mg | INTRAMUSCULAR | Status: DC | PRN
  Administered 2022-02-02: 0.5 mg via INTRAVENOUS
  Filled 2022-02-02: qty 0.5

## 2022-02-02 MED ORDER — OXYBUTYNIN CHLORIDE 5 MG PO TABS
5.0000 mg | ORAL_TABLET | Freq: Every day | ORAL | Status: DC
  Administered 2022-02-02 – 2022-02-05 (×5): 5 mg via ORAL
  Filled 2022-02-02 (×6): qty 1

## 2022-02-02 MED ORDER — ORAL CARE MOUTH RINSE: 15.0000 mL | OROMUCOSAL | Status: DC | PRN

## 2022-02-02 MED ORDER — MELATONIN 5 MG PO TABS: 5.0000 mg | ORAL_TABLET | Freq: Every evening | ORAL | Status: DC | PRN

## 2022-02-02 MED ORDER — METOPROLOL TARTRATE 5 MG/5ML IV SOLN: 2.5000 mg | Freq: Four times a day (QID) | INTRAVENOUS | Status: DC | PRN

## 2022-02-02 MED ORDER — CEFAZOLIN SODIUM-DEXTROSE 2-4 GM/100ML-% IV SOLN
2.0000 g | INTRAVENOUS | Status: AC
  Administered 2022-02-03: 2 g via INTRAVENOUS
  Filled 2022-02-02: qty 100

## 2022-02-02 MED ORDER — MUPIROCIN 2 % EX OINT
1.0000 | TOPICAL_OINTMENT | Freq: Two times a day (BID) | CUTANEOUS | Status: DC
  Administered 2022-02-02 – 2022-02-06 (×8): 1 via NASAL
  Filled 2022-02-02 (×3): qty 22

## 2022-02-02 MED ORDER — OXYCODONE-ACETAMINOPHEN 5-325 MG PO TABS
1.0000 | ORAL_TABLET | ORAL | Status: DC | PRN
  Administered 2022-02-02 – 2022-02-06 (×8): 1 via ORAL
  Filled 2022-02-02 (×8): qty 1

## 2022-02-02 MED ORDER — LOSARTAN POTASSIUM 50 MG PO TABS
50.0000 mg | ORAL_TABLET | Freq: Every day | ORAL | Status: DC
  Administered 2022-02-02 – 2022-02-05 (×4): 50 mg via ORAL
  Filled 2022-02-02 (×4): qty 1

## 2022-02-02 MED ORDER — ENOXAPARIN SODIUM 40 MG/0.4ML IJ SOSY
40.0000 mg | PREFILLED_SYRINGE | INTRAMUSCULAR | Status: DC
  Administered 2022-02-02: 40 mg via SUBCUTANEOUS
  Filled 2022-02-02: qty 0.4

## 2022-02-02 MED ORDER — CHLORHEXIDINE GLUCONATE 4 % EX LIQD
60.0000 mL | Freq: Once | CUTANEOUS | Status: AC
  Administered 2022-02-03: 4 via TOPICAL
  Filled 2022-02-02: qty 60

## 2022-02-02 NOTE — Assessment & Plan Note (Signed)
Strict intake and output monitoring Creatinine near baseline Minimizing nephrotoxic agents as much as possible Serial chemistries to monitor renal function and electrolytes  

## 2022-02-02 NOTE — Assessment & Plan Note (Addendum)
   Notable displaced and angulated fracture of the distal right femoral diaphysis suffered during the episode of syncope/fall  Patient reevaluated by Dr. Alvan Dame today.  Plan is for patient to be transferred to [facility] for Dr. Doreatha Martin to perform ORIF on 10/23 pending bed availability  As needed opiate-based analgesics for associated pain  Vitamin D level ordered.  N.p.o. after midnight

## 2022-02-02 NOTE — Assessment & Plan Note (Signed)
   Has experienced episodes of syncope in the past per my review of the chart, particularly in 2021 and 2016  Notes from 2021 suggest that orthostatic hypotension is the cause  Patient provides a history that is seemingly consistent with orthostatic syncope once again, with patient losing consciousness shortly after arising from a seated position.  Considering multiple occurrences and advanced age we will obtain echocardiogram, EKG  Patient does not appear volume depleted  Patient may benefit from use of compression stockings in the future and if this is ineffective, fludrocortisone.

## 2022-02-02 NOTE — TOC Progression Note (Signed)
Transition of Care [facility]) - Progression Note    Patient Details  Name: Mallory Murphy MRN: [id number] Date of Birth: 1946/04/03  Transition of Care [facility]) CM/SW Contact  Henrietta Dine, RN Phone Number: 02/02/2022, 4:19 PM  Clinical Narrative:    [facility] consult for SNF; awaiting PT consult.        Expected Discharge Plan and Services                                                 Social Determinants of Health (SDOH) Interventions    Readmission Risk Interventions     No data to display

## 2022-02-02 NOTE — Assessment & Plan Note (Signed)
   Chest x-ray reveals faint hazy infiltrate at the right base however patient denies any symptoms of shortness of breath cough or fever and therefore no antibiotics will be initiated  Urinalysis unremarkable  Leukocytosis likely stress-induced

## 2022-02-02 NOTE — Progress Notes (Signed)
Patient ID: Mallory Murphy, female   DOB: [birth year], 76 y.o.   MRN: [id number]   I was in contact with Dr. Alvan Dame and Dr. Doreatha Martin who will Operate on this patient Monday at [facility]. Patient is aware..  Please have patient transferred to [facility] today  for preparation  Of the surgery Monday     Thank you.

## 2022-02-02 NOTE — Plan of Care (Signed)
  Problem: Education: Goal: Knowledge of General Education information will improve Description: Including pain rating scale, medication(s)/side effects and non-pharmacologic comfort measures Outcome: Progressing   Problem: Pain Managment: Goal: General experience of comfort will improve Outcome: Progressing   Problem: Safety: Goal: Ability to remain free from injury will improve Outcome: Progressing   

## 2022-02-02 NOTE — Progress Notes (Addendum)
Patient transferred to [facility] via [place]. All belongings w/ patient. Patient's niece present during transfer. Report called to [place] LPN.

## 2022-02-02 NOTE — Progress Notes (Signed)
Patient ID: Mallory Murphy, female   DOB: 12-May-1945, 76 y.o.   MRN: [id number]  Very pleasant 76 year old female with longstanding history of rheumatoid arthritis.  She reportedly passed out perhaps related to her sugar levels sustaining a right distal periprosthetic fracture.  Dr. Tonita Cong was on call and has arranged for Dr. Lennette Bihari Haddix to address her periprosthetic fracture on Monday, 02/03/2022.  Apparently, transfer orders are made for her to go to [facility] given bed availability.  I did speak to Dr. Doreatha Martin who has plans for her to have her procedure performed in the morning on Monday. We will place urgent orders for consent.  She is currently in a knee immobilizer on the right lower extremity.  For now bedrest and minimal activity to decrease sources of pain.

## 2022-02-02 NOTE — Progress Notes (Signed)
Pt. Arrived to unit alert and oriented X 4, VSS, c/o pain with movement. Family at bedside. Bed in lowest position  call bell within reach

## 2022-02-02 NOTE — H&P (Signed)
History and Physical  Mallory Murphy VQM:[id number] DOB: 1945-07-16 DOA: 02/01/2022  Referring physician: Annice Murphy  PCP: Mallory Carol, MD  Outpatient Specialists: Ortho Patient coming from: Home  Chief Complaint: Fall   HPI: Mallory Murphy is a 76 y.o. female with medical history significant for orthostatic hypotension, prior syncope, recurrent falls, ambulatory dysfunction uses a walker to ambulate, iron deficiency anemia, hypertension, chronic anxiety/depression, status post right knee replacement less than 3 years ago, who presented to [facility] ED after a fall at home.  She thinks she stood up too quickly to assist her cat, got dizzy and blacked out.  The patient landed on her right knee.  EMS was activated and the patient was brought into the ED for further evaluation.  In the ED, work-up revealed angulated right distal femur fracture.  The patient received IV analgesics, 500 cc normal saline IV fluid bolus.  EDP discussed the case with orthopedic surgery.  Plan for surgical intervention.  N.p.o. after midnight.  The patient was admitted by Mallory Murphy, hospitalist service.  ED Course: Tmax 99.1.  BP 170/80, pulse 102, respiratory rate 15, O2 saturation 98% on room air.  Lab studies remarkable for WBC 17, neutrophil count 13.9.  UA negative for pyuria.  Review of Systems: Review of systems as noted in the HPI. All other systems reviewed and are negative.   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 She has never used smokeless tobacco. She reports that she does not currently use alcohol. She reports that she does not use drugs.   Allergies  Allergen Reactions   Macrobid [Nitrofurantoin Macrocrystal] Nausea Only    History reviewed. No pertinent family history.  The patient has no children.  Prior to Admission medications   Medication Sig Start Date End Date Taking? Authorizing Provider  Darbepoetin Alfa-Albumin (ARANESP IJ) Inject 300 mcg into the skin See admin instructions. 300 mcg into the skin every 3 months for Hgb <11   Yes [provider]  diphenhydrAMINE (BENADRYL) 25 MG tablet Take 25 mg by mouth at bedtime.   Yes [provider]  escitalopram (LEXAPRO) 10 MG tablet Take 10 mg by mouth at bedtime.   Yes [provider]  leflunomide (ARAVA) 20 MG tablet Take 20 mg by mouth at bedtime.   Yes [provider]  oxybutynin (DITROPAN) 5 MG tablet Take 5 mg by mouth at bedtime. 02/27/20  Yes [provider]  [place] ARIA 50 MG/4ML SOLN injection Inject into the vein every 8 (eight) weeks.   Yes [provider]  losartan (COZAAR) 50 MG tablet  Take 50 mg by mouth at bedtime. 01/31/22   [provider]    Physical Exam: BP (!) 173/98   Pulse (!) 112   Temp 99.1 F (37.3 C) (Oral)   Resp 18   Ht '5\' 1"'$  (1.549 m)   Wt 61.7 kg   SpO2 100%   BMI 25.70 kg/m   General: 76 y.o. year-old female well developed well nourished in no acute distress.  Alert and oriented x3. Cardiovascular: Regular rate and rhythm with no rubs or gallops.  No thyromegaly or JVD noted.  No lower extremity edema. 2/4 pulses in  all 4 extremities. Respiratory: Clear to auscultation with no wheezes or rales. Good inspiratory effort. Abdomen: Soft nontender nondistended with normal bowel sounds x4 quadrants. Muskuloskeletal: No cyanosis, clubbing or edema noted bilaterally.  Right knee and stabilized. Neuro: CN II-XII intact, strength, sensation, reflexes Skin: No ulcerative lesions noted or rashes Psychiatry: Judgement and insight appear normal. Mood is appropriate for condition and setting          Labs on Admission:  Basic Metabolic Panel: Recent Labs  Lab 01/28/22 1351 02/01/22 1756  NA 137 135  K 4.1 4.2  CL 106 106  CO2 23 22  GLUCOSE 113* 109*  BUN 22 22  CREATININE 0.87 0.97  CALCIUM 8.2* 8.2*   Liver Function Tests: Recent Labs  Lab 01/28/22 1351 02/01/22 1756  AST 26 27  ALT 13 12  ALKPHOS 79 73  BILITOT 0.3 0.9  PROT 7.5 7.2  ALBUMIN 3.5 3.4*   No results for input(s): "LIPASE", "AMYLASE" in the last 168 hours. No results for input(s): "AMMONIA" in the last 168 hours. CBC: Recent Labs  Lab 01/28/22 1351 02/01/22 1641  WBC 9.5 17.0*  NEUTROABS 5.6 13.9*  HGB 11.6* 12.0  HCT 34.8* 36.4  MCV 90.4 90.5  PLT 243 269   Cardiac Enzymes: No results for input(s): "CKTOTAL", "CKMB", "CKMBINDEX", "TROPONINI" in the last 168 hours.  BNP (last 3 results) No results for input(s): "BNP" in the last 8760 hours.  ProBNP (last 3 results) No results for input(s): "PROBNP" in the last 8760 hours.  CBG: No results for input(s): "GLUCAP" in the last 168 hours.  Radiological Exams on Admission: DG Knee 2 Views Right  Result Date: 02/01/2022 CLINICAL DATA:  Post reduction. EXAM: RIGHT KNEE - 1-2 VIEW COMPARISON:  Earlier radiograph dated 02/01/2022. FINDINGS: Displaced and angulated fracture of the distal right femoral diaphysis above the ostia cc. The hardware is intact. The bones are osteopenic. There has been interval placement overlying immobilizer. IMPRESSION: Displaced and angulated  fracture of the distal right femoral diaphysis. Electronically Signed   By: Mallory Murphy M.D.   On: 02/01/2022 20:41   DG Hip Unilat W or Wo Pelvis 2-3 Views Right  Result Date: 02/01/2022 CLINICAL DATA:  Fall. EXAM: DG HIP (WITH OR WITHOUT PELVIS) 2-3V RIGHT COMPARISON:  None Available. FINDINGS: Bilateral total hip arthroplasties. The arthroplasty components appear intact and in anatomic alignment. There is no acute fracture or dislocation. The bones are osteopenic. Degenerative changes of the lower lumbar spine. The soft tissues are unremarkable. IMPRESSION: 1. No acute fracture or dislocation. 2. Bilateral total hip arthroplasties. Electronically Signed   By: Mallory Murphy M.D.   On: 02/01/2022 18:13   DG Knee 2 Views Right  Result Date: 02/01/2022 CLINICAL DATA:  Right knee pain after fall. EXAM: RIGHT KNEE - 1-2 VIEW COMPARISON:  None Available. FINDINGS: Status post right total knee arthroplasty. Severely angulated fracture is  seen involving the distal right femoral shaft just above the prosthesis. IMPRESSION: Severely angulated fracture is seen involving the distal right femoral shaft just above prosthesis. Electronically Signed   By: Marijo Conception M.D.   On: 02/01/2022 16:20    EKG: I independently viewed the EKG done and my findings are as followed: Sinus rhythm rate of 87.  Nonspecific ST-T changes.  QTc 449.  Assessment/Plan Present on Admission:  Femur fracture, right ([place])  Principal Problem:   Femur fracture, right ([place])  Right distal femur fracture, post fall, POA. Analgesics as needed along with bowel regimen Appreciate the pain surgery's assistance NPO after midnight for possible surgical intervention  Essential hypertension, BP is not at goal, elevated BP likely exacerbated by pain Control pain Resume home oral antihypertensives Monitor vital signs  Leukocytosis, suspect reactive in the setting of bone fracture WBC 17 No evidence of acute infective  process Monitor for now  Chronic anxiety/depression Resume home escitalopram     DVT prophylaxis: Subcu Lovenox daily  Code Status: Full code  Family Communication: Updated niece at bedside.  Disposition Plan: Admitted to MedSurg unit  Consults called: Orthopedic surgery consulted by EDP  Admission status: Inpatient status.   Status is: Inpatient The patient requires at least 2 midnights for further evaluation and treatment of present condition.   Kayleen Memos MD [facility] Pager [phone]  If 7PM-7AM, please contact night-coverage [email or web address] Password [facility] Murphy  02/02/2022, 12:17 AM

## 2022-02-02 NOTE — Hospital Course (Addendum)
76 y.o. female with medical history significant for iron deficiency anemia, erythropoietin deficiency, MGUS, rheumatoid arthritis, hypertension, anxiety disorder, depression presenting to [facility] emergency department after experiencing an episode of syncope upon standing.  Upon evaluation in the emergency department work-up revealed an angulated right distal femur fracture.  While in the emergency department Case was discussed with Dr. Tonita Cong with orthopedic surgery and the hospitalist group was then called to assess the patient for admission to the hospital.   On 10/22 Dr. Tonita Cong who upon discussions with Dr. Alvan Dame and Dr. Doreatha Martin made arrangements for patient to tentatively have operative intervention on 10/23 at [facility].

## 2022-02-02 NOTE — Assessment & Plan Note (Signed)
   Longstanding known history of normocytic anemia that is multifactorial in origin secondary to erythropoietin deficiency, iron deficiency anemia and vitamin B12 deficiency  No clinical evidence of bleeding, hemoglobin currently 10  Monitoring hemoglobin and hematocrit with serial CBCs  Type and screen will be obtained in preparation for surgery

## 2022-02-02 NOTE — Progress Notes (Signed)
  Echocardiogram 2D Echocardiogram has been performed.  Mallory Murphy 02/02/2022, 9:38 AM

## 2022-02-02 NOTE — Progress Notes (Signed)
PROGRESS NOTE   Mallory Murphy  XTG:[id number] DOB: 1945-09-26 DOA: 02/01/2022 PCP: Seward Carol, MD   Date of Service: the patient was seen and examined on 02/02/2022  Brief Narrative:  76 y.o. female with medical history significant for iron deficiency anemia, rheumatoid arthritis, hypertension, anxiety disorder, depression presenting to [facility] emergency department after experiencing an episode of syncope upon standing.  Upon evaluation in the emergency department work-up revealed an angulated right distal femur fracture.  While in the emergency department Case was discussed with Dr. Tonita Cong with orthopedic surgery and the hospitalist group was then called to assess the patient for admission to the hospital.   On 10/22 Dr. Tonita Cong who upon discussions with Dr. Alvan Dame and Dr. Doreatha Martin made arrangements for patient to tentatively have operative intervention on 10/23 at [facility].     Assessment and Plan: * Femur fracture, right ([place]) Notable displaced and angulated fracture of the distal right femoral diaphysis suffered during the episode of syncope/fall Patient reevaluated by Dr. Alvan Dame today.  Plan is for patient to be transferred to [facility] for Dr. Doreatha Martin to perform ORIF on 10/23 pending bed availability As needed opiate-based analgesics for associated pain Vitamin D level ordered. N.p.o. after midnight  Orthostatic syncope Has experienced episodes of syncope in the past per my review of the chart, particularly in 2021 and 2016 Notes from 2021 suggest that orthostatic hypotension is the cause Patient provides a history that is seemingly consistent with orthostatic syncope once again, with patient losing consciousness shortly after arising from a seated position. Considering multiple occurrences and advanced age we will obtain echocardiogram, EKG Patient does not appear volume depleted Patient may benefit from use of compression stockings in the future and if  this is ineffective, fludrocortisone.  Chronic kidney disease, stage 3a (HCC) Strict intake and output monitoring Creatinine near baseline Minimizing nephrotoxic agents as much as possible Serial chemistries to monitor renal function and electrolytes   Normocytic anemia Longstanding known history of normocytic anemia that is multifactorial in origin secondary to erythropoietin deficiency, iron deficiency anemia and vitamin B12 deficiency No clinical evidence of bleeding, hemoglobin currently 10 Monitoring hemoglobin and hematocrit with serial CBCs Type and screen will be obtained in preparation for surgery  Leukocytosis Chest x-ray reveals faint hazy infiltrate at the right base however patient denies any symptoms of shortness of breath cough or fever and therefore no antibiotics will be initiated Urinalysis unremarkable Leukocytosis likely stress-induced   Subjective:  Patient complaining of right thigh and hip pain.  Moderate to severe in intensity, worse with movement radiating distally ongoing since admission  Physical Exam:  Vitals:   02/02/22 0801 02/02/22 1238 02/02/22 1430 02/02/22 1727  BP: (!) 129/58 (!) 114/55 (!) 140/70 (!) 154/63  Pulse: (!) 102 98 93 94  Resp: '18 17 16 17  '$ Temp: 98.5 F (36.9 C) 98.9 F (37.2 C) 99.1 F (37.3 C) 98.7 F (37.1 C)  TempSrc: Oral Oral  Oral  SpO2: 94% 96% 94% 97%  Weight:      Height:        Constitutional: Awake alert and oriented x3, no associated distress.   Skin: no rashes, no lesions, good skin turgor noted. Eyes: Pupils are equally reactive to light.  No evidence of scleral icterus or conjunctival pallor.  ENMT: Moist mucous membranes noted.  Posterior pharynx clear of any exudate or lesions.   Respiratory: clear to auscultation bilaterally, no wheezing, no crackles. Normal respiratory effort. No accessory muscle use.  Cardiovascular: Regular rate and rhythm, no murmurs / rubs / gallops. No extremity edema. 2+ pedal  pulses. No carotid bruits.  Abdomen: Abdomen is soft and nontender.  No evidence of intra-abdominal masses.  Positive bowel sounds noted in all quadrants.   Musculoskeletal: Pain with both passive and active range of motion of the right lower extremity  Data Reviewed:  I have personally reviewed and interpreted labs, imaging.  Significant findings are   CBC: Recent Labs  Lab 01/28/22 1351 02/01/22 1641 02/02/22 0336  WBC 9.5 17.0* 13.0*  NEUTROABS 5.6 13.9* 8.9*  HGB 11.6* 12.0 10.0*  HCT 34.8* 36.4 30.4*  MCV 90.4 90.5 91.3  PLT 243 269 818   Basic Metabolic Panel: Recent Labs  Lab 01/28/22 1351 02/01/22 1756 02/02/22 0336  NA 137 135 137  K 4.1 4.2 4.0  CL 106 106 106  CO2 '23 22 23  '$ GLUCOSE 113* 109* 126*  BUN 22 22 24*  CREATININE 0.87 0.97 1.09*  CALCIUM 8.2* 8.2* 8.2*  MG  --   --  1.8  PHOS  --   --  3.9   GFR: Estimated Creatinine Clearance: 37.6 mL/min (A) (by C-G formula based on SCr of 1.09 mg/dL (H)). Liver Function Tests: Recent Labs  Lab 01/28/22 1351 02/01/22 1756 02/02/22 0336  AST '26 27 23  '$ ALT '13 12 13  '$ ALKPHOS 79 73 71  BILITOT 0.3 0.9 0.7  PROT 7.5 7.2 6.6  ALBUMIN 3.5 3.4* 3.2*    Telemetry: Personally reviewed.  Rhythm is sinus tachycardia with heart rate of 116 bpm.  Evidence of PACs.    Code Status:  Full code       Severity of Illness:  The appropriate patient status for this patient is INPATIENT. Inpatient status is judged to be reasonable and necessary in order to provide the required intensity of service to ensure the patient's safety. The patient's presenting symptoms, physical exam findings, and initial radiographic and laboratory data in the context of their chronic comorbidities is felt to place them at high risk for further clinical deterioration. Furthermore, it is not anticipated that the patient will be medically stable for discharge from the hospital within 2 midnights of admission.   * I certify that at the point  of admission it is my clinical judgment that the patient will require inpatient hospital care spanning beyond 2 midnights from the point of admission due to high intensity of service, high risk for further deterioration and high frequency of surveillance required.*  Time spent:  53 minutes  Author:  Vernelle Emerald MD  02/02/2022 6:26 PM

## 2022-02-03 ENCOUNTER — Inpatient Hospital Stay (HOSPITAL_COMMUNITY): Payer: Medicare Other

## 2022-02-03 ENCOUNTER — Inpatient Hospital Stay (HOSPITAL_COMMUNITY): Payer: Medicare Other | Admitting: Certified Registered Nurse Anesthetist

## 2022-02-03 ENCOUNTER — Other Ambulatory Visit: Payer: Self-pay

## 2022-02-03 ENCOUNTER — Encounter (HOSPITAL_COMMUNITY)
Admission: EM | Disposition: A | Discharge status: Skilled Nursing Facility | Payer: Self-pay | Source: Home / Self Care | Attending: Internal Medicine

## 2022-02-03 DIAGNOSIS — W19XXXA Unspecified fall, initial encounter: Secondary | ICD-10-CM | POA: Diagnosis not present

## 2022-02-03 DIAGNOSIS — S72444A Nondisplaced fracture of lower epiphysis (separation) of right femur, initial encounter for closed fracture: Secondary | ICD-10-CM | POA: Diagnosis not present

## 2022-02-03 DIAGNOSIS — D649 Anemia, unspecified: Secondary | ICD-10-CM

## 2022-02-03 DIAGNOSIS — I10 Essential (primary) hypertension: Secondary | ICD-10-CM

## 2022-02-03 DIAGNOSIS — R55 Syncope and collapse: Secondary | ICD-10-CM

## 2022-02-03 DIAGNOSIS — S72401A Unspecified fracture of lower end of right femur, initial encounter for closed fracture: Secondary | ICD-10-CM

## 2022-02-03 DIAGNOSIS — N1831 Chronic kidney disease, stage 3a: Secondary | ICD-10-CM | POA: Diagnosis not present

## 2022-02-03 DIAGNOSIS — M1990 Unspecified osteoarthritis, unspecified site: Secondary | ICD-10-CM

## 2022-02-03 HISTORY — PX: ORIF FEMUR FRACTURE: SHX2119

## 2022-02-03 LAB — VITAMIN D 25 HYDROXY (VIT D DEFICIENCY, FRACTURES): Vit D, 25-Hydroxy: 33.31 ng/mL (ref 30–100)

## 2022-02-03 LAB — CBC WITH DIFFERENTIAL/PLATELET
Abs Immature Granulocytes: 0.03 10*3/uL (ref 0.00–0.07)
Basophils Absolute: 0 10*3/uL (ref 0.0–0.1)
Basophils Relative: 0 %
Eosinophils Absolute: 0.3 10*3/uL (ref 0.0–0.5)
Eosinophils Relative: 3 %
HCT: 25.8 % — ABNORMAL LOW (ref 36.0–46.0)
Hemoglobin: 8.5 g/dL — ABNORMAL LOW (ref 12.0–15.0)
Immature Granulocytes: 0 %
Lymphocytes Relative: 19 %
Lymphs Abs: 2 10*3/uL (ref 0.7–4.0)
MCH: 30.5 pg (ref 26.0–34.0)
MCHC: 32.9 g/dL (ref 30.0–36.0)
MCV: 92.5 fL (ref 80.0–100.0)
Monocytes Absolute: 1.9 10*3/uL — ABNORMAL HIGH (ref 0.1–1.0)
Monocytes Relative: 17 %
Neutro Abs: 6.4 10*3/uL (ref 1.7–7.7)
Neutrophils Relative %: 61 %
Platelets: 188 10*3/uL (ref 150–400)
RBC: 2.79 MIL/uL — ABNORMAL LOW (ref 3.87–5.11)
RDW: 13.4 % (ref 11.5–15.5)
WBC: 10.7 10*3/uL — ABNORMAL HIGH (ref 4.0–10.5)
nRBC: 0 % (ref 0.0–0.2)

## 2022-02-03 LAB — COMPREHENSIVE METABOLIC PANEL
ALT: 13 U/L (ref 0–44)
AST: 24 U/L (ref 15–41)
Albumin: 2.7 g/dL — ABNORMAL LOW (ref 3.5–5.0)
Alkaline Phosphatase: 60 U/L (ref 38–126)
Anion gap: 8 (ref 5–15)
BUN: 23 mg/dL (ref 8–23)
CO2: 22 mmol/L (ref 22–32)
Calcium: 8.1 mg/dL — ABNORMAL LOW (ref 8.9–10.3)
Chloride: 106 mmol/L (ref 98–111)
Creatinine, Ser: 1.18 mg/dL — ABNORMAL HIGH (ref 0.44–1.00)
GFR, Estimated: 48 mL/min — ABNORMAL LOW (ref >60)
Glucose, Bld: 109 mg/dL — ABNORMAL HIGH (ref 70–99)
Potassium: 3.8 mmol/L (ref 3.5–5.1)
Sodium: 136 mmol/L (ref 135–145)
Total Bilirubin: 0.4 mg/dL (ref 0.3–1.2)
Total Protein: 5.9 g/dL — ABNORMAL LOW (ref 6.5–8.1)

## 2022-02-03 LAB — PROTIME-INR
INR: 1.2 (ref 0.8–1.2)
Prothrombin Time: 15.1 seconds (ref 11.4–15.2)

## 2022-02-03 LAB — TYPE AND SCREEN
ABO/RH(D): O POS
Antibody Screen: NEGATIVE

## 2022-02-03 LAB — APTT: aPTT: 30 seconds (ref 24–36)

## 2022-02-03 LAB — MAGNESIUM: Magnesium: 1.7 mg/dL (ref 1.7–2.4)

## 2022-02-03 SURGERY — OPEN REDUCTION INTERNAL FIXATION (ORIF) DISTAL FEMUR FRACTURE
Anesthesia: General | Site: Leg Upper | Laterality: Right

## 2022-02-03 MED ORDER — ACETAMINOPHEN 10 MG/ML IV SOLN
INTRAVENOUS | Status: AC
  Filled 2022-02-03: qty 100

## 2022-02-03 MED ORDER — ONDANSETRON HCL 4 MG/2ML IJ SOLN
INTRAMUSCULAR | Status: AC
  Filled 2022-02-03: qty 2

## 2022-02-03 MED ORDER — ENOXAPARIN SODIUM 40 MG/0.4ML IJ SOSY
40.0000 mg | PREFILLED_SYRINGE | INTRAMUSCULAR | Status: DC
  Administered 2022-02-04 – 2022-02-06 (×3): 40 mg via SUBCUTANEOUS
  Filled 2022-02-03 (×3): qty 0.4

## 2022-02-03 MED ORDER — LIDOCAINE 2% (20 MG/ML) 5 ML SYRINGE
INTRAMUSCULAR | Status: AC
  Filled 2022-02-03: qty 5

## 2022-02-03 MED ORDER — PROPOFOL 10 MG/ML IV BOLUS
INTRAVENOUS | Status: AC
  Filled 2022-02-03: qty 20

## 2022-02-03 MED ORDER — 0.9 % SODIUM CHLORIDE (POUR BTL) OPTIME
TOPICAL | Status: DC | PRN
  Administered 2022-02-03: 1000 mL

## 2022-02-03 MED ORDER — SODIUM CHLORIDE 0.9 % IV SOLN: INTRAVENOUS | Status: DC

## 2022-02-03 MED ORDER — VANCOMYCIN HCL 1000 MG IV SOLR
INTRAVENOUS | Status: AC
  Filled 2022-02-03: qty 20

## 2022-02-03 MED ORDER — ONDANSETRON HCL 4 MG/2ML IJ SOLN
INTRAMUSCULAR | Status: DC | PRN
  Administered 2022-02-03: 4 mg via INTRAVENOUS

## 2022-02-03 MED ORDER — FENTANYL CITRATE (PF) 100 MCG/2ML IJ SOLN
INTRAMUSCULAR | Status: AC
  Filled 2022-02-03: qty 2

## 2022-02-03 MED ORDER — TRANEXAMIC ACID-NACL 1000-0.7 MG/100ML-% IV SOLN
1000.0000 mg | Freq: Once | INTRAVENOUS | Status: AC
  Administered 2022-02-03: 1000 mg via INTRAVENOUS
  Filled 2022-02-03: qty 100

## 2022-02-03 MED ORDER — VANCOMYCIN HCL 1000 MG IV SOLR
INTRAVENOUS | Status: DC | PRN
  Administered 2022-02-03: 1000 mg

## 2022-02-03 MED ORDER — ONDANSETRON HCL 4 MG/2ML IJ SOLN: 4.0000 mg | Freq: Four times a day (QID) | INTRAMUSCULAR | Status: DC | PRN

## 2022-02-03 MED ORDER — POLYETHYLENE GLYCOL 3350 17 G PO PACK
17.0000 g | PACK | Freq: Two times a day (BID) | ORAL | Status: AC
  Administered 2022-02-03: 17 g via ORAL
  Filled 2022-02-03 (×4): qty 1

## 2022-02-03 MED ORDER — DEXAMETHASONE SODIUM PHOSPHATE 10 MG/ML IJ SOLN
INTRAMUSCULAR | Status: AC
  Filled 2022-02-03: qty 1

## 2022-02-03 MED ORDER — DEXAMETHASONE SODIUM PHOSPHATE 10 MG/ML IJ SOLN
INTRAMUSCULAR | Status: DC | PRN
  Administered 2022-02-03: 10 mg via INTRAVENOUS

## 2022-02-03 MED ORDER — OXYCODONE HCL 5 MG/5ML PO SOLN: 5.0000 mg | Freq: Once | ORAL | Status: DC | PRN

## 2022-02-03 MED ORDER — LIDOCAINE 2% (20 MG/ML) 5 ML SYRINGE
INTRAMUSCULAR | Status: DC | PRN
  Administered 2022-02-03: 60 mg via INTRAVENOUS

## 2022-02-03 MED ORDER — OXYCODONE HCL 5 MG PO TABS: 5.0000 mg | ORAL_TABLET | Freq: Once | ORAL | Status: DC | PRN

## 2022-02-03 MED ORDER — FENTANYL CITRATE (PF) 100 MCG/2ML IJ SOLN
INTRAMUSCULAR | Status: DC | PRN
  Administered 2022-02-03: 50 ug via INTRAVENOUS
  Administered 2022-02-03: 100 ug via INTRAVENOUS
  Administered 2022-02-03 (×2): 25 ug via INTRAVENOUS
  Administered 2022-02-03: 50 ug via INTRAVENOUS

## 2022-02-03 MED ORDER — FENTANYL CITRATE (PF) 100 MCG/2ML IJ SOLN
25.0000 ug | INTRAMUSCULAR | Status: DC | PRN
  Administered 2022-02-03: 25 ug via INTRAVENOUS

## 2022-02-03 MED ORDER — FENTANYL CITRATE (PF) 250 MCG/5ML IJ SOLN
INTRAMUSCULAR | Status: AC
  Filled 2022-02-03: qty 5

## 2022-02-03 MED ORDER — LACTATED RINGERS IV SOLN: INTRAVENOUS | Status: DC | PRN

## 2022-02-03 MED ORDER — DOCUSATE SODIUM 100 MG PO CAPS
100.0000 mg | ORAL_CAPSULE | Freq: Two times a day (BID) | ORAL | Status: DC
  Administered 2022-02-03 – 2022-02-05 (×4): 100 mg via ORAL
  Filled 2022-02-03 (×7): qty 1

## 2022-02-03 MED ORDER — ACETAMINOPHEN 10 MG/ML IV SOLN
INTRAVENOUS | Status: DC | PRN
  Administered 2022-02-03: 1000 mg via INTRAVENOUS

## 2022-02-03 MED ORDER — PROPOFOL 10 MG/ML IV BOLUS
INTRAVENOUS | Status: DC | PRN
  Administered 2022-02-03: 40 mg via INTRAVENOUS

## 2022-02-03 SURGICAL SUPPLY — 75 items
BAG COUNTER SPONGE SURGICOUNT (BAG) ×1 IMPLANT
BAG SPNG CNTER NS LX DISP (BAG) ×1
BIT DRILL 4.3 (BIT) ×1
BIT DRILL 4.3X300MM (BIT) IMPLANT
BIT DRILL LONG 3.3 (BIT) IMPLANT
BIT DRILL QC 3.3X195 (BIT) IMPLANT
BLADE CLIPPER SURG (BLADE) IMPLANT
BNDG CMPR MED 10X6 ELC LF (GAUZE/BANDAGES/DRESSINGS) ×1
BNDG COHESIVE 6X5 TAN STRL LF (GAUZE/BANDAGES/DRESSINGS) ×1 IMPLANT
BNDG ELASTIC 4X5.8 VLCR STR LF (GAUZE/BANDAGES/DRESSINGS) IMPLANT
BNDG ELASTIC 6X10 VLCR STRL LF (GAUZE/BANDAGES/DRESSINGS) ×1 IMPLANT
BNDG ELASTIC 6X5.8 VLCR STR LF (GAUZE/BANDAGES/DRESSINGS) IMPLANT
BRUSH SCRUB EZ PLAIN DRY (MISCELLANEOUS) ×2 IMPLANT
CANISTER SUCT 3000ML PPV (MISCELLANEOUS) ×1 IMPLANT
CAP LOCK NCB (Cap) IMPLANT
CHLORAPREP W/TINT 26 (MISCELLANEOUS) ×1 IMPLANT
COVER SURGICAL LIGHT HANDLE (MISCELLANEOUS) ×1 IMPLANT
DERMABOND ADVANCED .7 DNX12 (GAUZE/BANDAGES/DRESSINGS) IMPLANT
DRAPE C-ARM 42X72 X-RAY (DRAPES) ×1 IMPLANT
DRAPE C-ARMOR (DRAPES) ×1 IMPLANT
DRAPE HALF SHEET 40X57 (DRAPES) ×2 IMPLANT
DRAPE ORTHO SPLIT 77X108 STRL (DRAPES) ×2
DRAPE SURG 17X23 STRL (DRAPES) ×1 IMPLANT
DRAPE SURG ORHT 6 SPLT 77X108 (DRAPES) ×2 IMPLANT
DRAPE U-SHAPE 47X51 STRL (DRAPES) ×1 IMPLANT
DRSG ADAPTIC 3X8 NADH LF (GAUZE/BANDAGES/DRESSINGS) IMPLANT
DRSG MEPILEX BORDER 4X12 (GAUZE/BANDAGES/DRESSINGS) IMPLANT
DRSG MEPILEX BORDER 4X4 (GAUZE/BANDAGES/DRESSINGS) IMPLANT
DRSG MEPILEX BORDER 4X8 (GAUZE/BANDAGES/DRESSINGS) IMPLANT
DRSG MEPILEX POST OP 4X8 (GAUZE/BANDAGES/DRESSINGS) IMPLANT
ELECT REM PT RETURN 9FT ADLT (ELECTROSURGICAL) ×1
ELECTRODE REM PT RTRN 9FT ADLT (ELECTROSURGICAL) ×1 IMPLANT
GAUZE PAD ABD 8X10 STRL (GAUZE/BANDAGES/DRESSINGS) ×3 IMPLANT
GAUZE SPONGE 4X4 12PLY STRL (GAUZE/BANDAGES/DRESSINGS) ×1 IMPLANT
GLOVE BIO SURGEON STRL SZ 6.5 (GLOVE) ×3 IMPLANT
GLOVE BIO SURGEON STRL SZ7.5 (GLOVE) ×4 IMPLANT
GLOVE BIOGEL PI IND STRL 6.5 (GLOVE) ×1 IMPLANT
GLOVE BIOGEL PI IND STRL 7.5 (GLOVE) ×1 IMPLANT
GOWN STRL REUS W/ TWL LRG LVL3 (GOWN DISPOSABLE) ×3 IMPLANT
GOWN STRL REUS W/TWL LRG LVL3 (GOWN DISPOSABLE) ×3
K-WIRE 2.0 (WIRE) ×1
K-WIRE FXSTD 280X2XNS SS (WIRE) ×1
KIT BASIN OR (CUSTOM PROCEDURE TRAY) ×1 IMPLANT
KIT TURNOVER KIT B (KITS) ×1 IMPLANT
KWIRE FXSTD 280X2XNS SS (WIRE) IMPLANT
NS IRRIG 1000ML POUR BTL (IV SOLUTION) ×1 IMPLANT
PACK TOTAL JOINT (CUSTOM PROCEDURE TRAY) ×1 IMPLANT
PAD ARMBOARD 7.5X6 YLW CONV (MISCELLANEOUS) ×2 IMPLANT
PAD CAST 4YDX4 CTTN HI CHSV (CAST SUPPLIES) ×1 IMPLANT
PADDING CAST COTTON 4X4 STRL (CAST SUPPLIES) ×1
PADDING CAST COTTON 6X4 STRL (CAST SUPPLIES) ×1 IMPLANT
PLATE FEM DIST NCB PP 278MM (Plate) IMPLANT
SCREW 5.0 80MM (Screw) IMPLANT
SCREW CORT NCB SELFTAP 5.0X42 (Screw) IMPLANT
SCREW NCB 3.5X75X5X6.2XST (Screw) IMPLANT
SCREW NCB 4.0X36MM (Screw) IMPLANT
SCREW NCB 5.0X34MM (Screw) IMPLANT
SCREW NCB 5.0X36MM (Screw) IMPLANT
SCREW NCB 5.0X75MM (Screw) ×2 IMPLANT
SCREW UNI 5.0 12MM (Screw) IMPLANT
SPONGE T-LAP 18X18 ~~LOC~~+RFID (SPONGE) IMPLANT
STAPLER VISISTAT 35W (STAPLE) ×1 IMPLANT
SUCTION FRAZIER HANDLE 10FR (MISCELLANEOUS) ×1
SUCTION TUBE FRAZIER 10FR DISP (MISCELLANEOUS) ×1 IMPLANT
SUT ETHILON 3 0 PS 1 (SUTURE) ×2 IMPLANT
SUT MNCRL AB 3-0 PS2 27 (SUTURE) IMPLANT
SUT VIC AB 0 CT1 27 (SUTURE) ×1
SUT VIC AB 0 CT1 27XBRD ANBCTR (SUTURE) IMPLANT
SUT VIC AB 1 CT1 27 (SUTURE) ×1
SUT VIC AB 1 CT1 27XBRD ANBCTR (SUTURE) IMPLANT
SUT VIC AB 2-0 CT1 27 (SUTURE) ×1
SUT VIC AB 2-0 CT1 TAPERPNT 27 (SUTURE) ×2 IMPLANT
TOWEL GREEN STERILE (TOWEL DISPOSABLE) ×2 IMPLANT
TRAY FOLEY MTR SLVR 16FR STAT (SET/KITS/TRAYS/PACK) IMPLANT
WATER STERILE IRR 1000ML POUR (IV SOLUTION) ×2 IMPLANT

## 2022-02-03 NOTE — Op Note (Signed)
Orthopaedic Surgery Operative Note (CSN: [id number] ) Date of Surgery: 02/03/2022  Admit Date: 02/01/2022   Diagnoses: Pre-Op Diagnoses: Right periprosthetic distal femur fracture  Post-Op Diagnosis: Same  Procedures: CPT 27511-Open reduction internal fixation of right distal femur fracture  Surgeons : Primary: Shona Needles, MD  Assistant: Patrecia Pace, PA-C  Location: OR 3   Anesthesia:General   Antibiotics: Ancef 2g preop with 1 gm vancomycin powder placed topically   Tourniquet time: None used    Estimated Blood MOQH:47 mL  Complications:None  Specimens:None   Implants: Implant Name Type Inv. Item Serial No. Manufacturer Lot No. LRB No. Used Action  PLATE FEM DIST NCB PP 278MM - [id number] Plate PLATE FEM DIST NCB PP 278MM  [employer](ORTH,TRAU,BIO,SG)  Right 1 Implanted  SCREW 5.0 80MM - [id number] Screw SCREW 5.0 80MM  [employer](ORTH,TRAU,BIO,SG)  Right 3 Implanted  SCREW NCB 5.0X36MM - [id number] Screw SCREW NCB 5.0X36MM  [employer](ORTH,TRAU,BIO,SG)  Right 1 Implanted  SCREW NCB 5.0X34MM - [id number] Screw SCREW NCB 5.0X34MM  [employer](ORTH,TRAU,BIO,SG)  Right 1 Implanted  SCREW NCB 5.0X75MM - [id number] Screw SCREW NCB 5.0X75MM  [employer](ORTH,TRAU,BIO,SG)  Right 2 Implanted  SCREW UNI 5.0 12MM - [id number] Screw SCREW UNI 5.0 12MM  [employer](ORTH,TRAU,BIO,SG)  Right 2 Implanted  SCREW NCB 4.0X36MM - [id number] Screw SCREW NCB 4.0X36MM  [employer](ORTH,TRAU,BIO,SG)  Right 1 Implanted  CAP LOCK NCB - [id number] Cap CAP LOCK NCB  [employer](ORTH,TRAU,BIO,SG)  Right 9 Implanted     Indications for Surgery: 76 year old female who sustained a right distal femur fracture.  Due to the unstable nature of her injury I recommend proceeding with open reduction internal fixation.  Risk and benefits were discussed with the patient and her niece.  Risk include but not limited to bleeding, infection, malunion, nonunion, hardware failure, heart rotation,  nerve or blood vessel injury, DVT, even the possibility anesthetic complications.  They agreed to proceed with surgery and consent was obtained.  Operative Findings: Open reduction internal fixation of right periprosthetic distal femur fracture using [employer] NCB distal femoral locking plate  Procedure: The patient was identified in the preoperative holding area. Consent was confirmed with the patient and their family and all questions were answered. The operative extremity was marked after confirmation with the patient. she was then brought back to the operating room by our anesthesia colleagues.  She was placed under general anesthetic and carefully transferred over to radiolucent flat top table.  A bump was placed under her operative hip.  The right lower extremity was then prepped and draped in usual sterile fashion.  Timeout was performed to verify the patient, the procedure, and the extremity.  Preoperative antibiotics were dosed.  The hip and knee were flexed over a triangle.  Fluoroscopic imaging showed the unstable nature of her injury.  A direct lateral approach to the distal femur was carried down through skin and subcutaneous tissue.  The IT band was incised in line with the incision and mobilized the vastus lateralis to expose the lateral condyle distal femur.  Reduction maneuver was performed.  I then slid a 12 hole [employer] NCB distal femoral locking plate along the lateral cortex of the femur.  I held it provisionally distally with a 2.0 mm K wire.  A 3.3 mm drill bit was used to bicortically drill just distal to the femoral prosthesis.  I confirmed adequate alignment and placement of the plate.  I then proceeded to place 5.0 millimeter screws distally to bring the plate  flush to bone.  I then percutaneously placed 5.0 millimeter screw into the femoral shaft to correct the coronal alignment and bring the proximal portion of plate flush to bone.  The 3.3 mm drill bit was removed  and a 4.0 millimeter screw was placed.  Unicortical screws were placed proximally to gain purchase proximal to the hip prosthesis.  Locking caps were placed on those screws as well as the first 5.0 millimeter screw in the 4.0 millimeter screw in the femoral shaft.  Another 5.0 millimeter screw was placed in the femoral shaft.  Targeting arm was removed.  I returned to the distal segment and placed 3 more 5.0 millimeter screws.  Locking caps were placed on all of the distal screws.  Final fluoroscopic imaging was obtained.  The incisions were copiously irrigated.  A gram of vancomycin powder was placed into the incision.  Layered closure of 0 Vicryl, 2-0 Vicryl 3-0 Monocryl and Dermabond was used to close the skin.  Sterile dressings were applied.  The patient was then awoken from anesthesia and taken to the PACU in stable condition.  Post Op Plan/Instructions: Patient will be weightbearing as tolerated to right lower extremity.  She will receive postoperative Ancef.  She will receive Lovenox for DVT prophylaxis while inpatient and discharged on a DOAC.  We will have her mobilize with physical and Occupational Therapy.  I was present and performed the entire surgery.  Patrecia Pace, PA-C did assist me throughout the case. An assistant was necessary given the difficulty in approach, maintenance of reduction and ability to instrument the fracture.   Katha Hamming, MD [facility]

## 2022-02-03 NOTE — Transfer of Care (Signed)
Immediate Anesthesia Transfer of Care Note  Patient: Mallory Murphy  Procedure(s) Performed: OPEN REDUCTION INTERNAL FIXATION OF RIGHT DISTAL PERI-PROSTHETIC FEMUR FRACTURE (Right: Leg Upper)  Patient Location: PACU  Anesthesia Type:General  Level of Consciousness: awake and patient cooperative  Airway & Oxygen Therapy: Patient Spontanous Breathing and Patient connected to nasal cannula oxygen  Post-op Assessment: Report given to RN and Post -op Vital signs reviewed and stable  Post vital signs: Reviewed and stable  Last Vitals:  Vitals Value Taken Time  BP 159/90 02/03/22 0910  Temp 36.6 C 02/03/22 0910  Pulse 98 02/03/22 0914  Resp 17 02/03/22 0914  SpO2 93 % 02/03/22 0914  Vitals shown include unvalidated device data.  Last Pain:  Vitals:   02/02/22 2349  TempSrc:   PainSc: Asleep      Patients Stated Pain Goal: 2 (58/59/29 2446)  Complications: No notable events documented.

## 2022-02-03 NOTE — Progress Notes (Signed)
[facility] PROGRESS NOTE    Progress Note  Mallory Murphy  IRJ:[id number] DOB: August 07, 1945 DOA: 02/01/2022 PCP: Seward Carol, MD     Brief Narrative:   Mallory Murphy is an 76 y.o. female past medical history significant for iron deficiency anemia, rheumatoid arthritis comes in after experiencing a syncopal episode work-up revealed a right distal femur fracture, orthopedic surgery was consulted who recommended or are I have on 02/03/2022   Assessment/Plan:   Femur fracture, right ([place]) Evaluated by Ortho who recommended surgery on 10/23. Physical therapy has been consulted. Continue narcotics and bowel regimen.  Orthostatic syncope Likely the cause of her fall. We will check orthostatics.  Chronic kidney disease, stage 3a (HCC) Creatinine appears to be at baseline  Normocytic anemia Hemoglobin today is 8.5 significant drop from admission of 12. She relates no signs of overt bleeding. To get CBC tomorrow morning if hemoglobin continues to drop we will go ahead and transfuse.  She relates she is currently asymptomatic we will see how she does with physical therapy.  Leukocytosis Likely reactive.  From traumatic event.   DVT prophylaxis: lovenox Family Communication:none Status is: Inpatient Remains inpatient appropriate because:  will need SNF    Code Status:     Code Status Orders  (From admission, onward)           Start     Ordered   02/02/22 0018  Full code  Continuous        02/02/22 0017           Code Status History     Date Active Date Inactive Code Status Order ID Comments User Context   01/31/2020 0940 02/06/2020 1700 Full Code [id number]  Harold Hedge, MD Inpatient   05/26/2016 1713 05/28/2016 1639 Full Code [id number]  Gaynelle Arabian, MD Inpatient   06/21/2014 1725 06/22/2014 1914 Full Code [id number]  Kelvin Cellar, MD Inpatient         IV Access:   Peripheral IV   Procedures and diagnostic studies:   DG FEMUR,  MIN 2 VIEWS RIGHT  Result Date: 02/03/2022 CLINICAL DATA:  [id number] Elective surgery [id number] EXAM: RIGHT FEMUR 2 VIEWS COMPARISON:  None Available. FINDINGS: Intraoperative fluoroscopic images during lateral plate fixation of the distal femur. Partially visualized right hip arthroplasty. Prior total knee arthroplasty. Improved fracture alignment. No immediate complication. IMPRESSION: Intraoperative fluoroscopic images during plate fixation of the distal femur. Improved fracture alignment. No evidence of immediate complication. Electronically Signed   By: Maurine Simmering M.D.   On: 02/03/2022 09:00   DG C-Arm 1-60 Min-No Report  Result Date: 02/03/2022 Fluoroscopy was utilized by the requesting physician.  No radiographic interpretation.   DG C-Arm 1-60 Min-No Report  Result Date: 02/03/2022 Fluoroscopy was utilized by the requesting physician.  No radiographic interpretation.   ECHOCARDIOGRAM COMPLETE  Result Date: 02/02/2022    ECHOCARDIOGRAM REPORT   Patient Name:   Mallory Murphy Date of Exam: 02/02/2022 Medical Rec #:  [id number]          Height:       61.0 in Accession #:    [id number]         Weight:       136.0 lb Date of Birth:  19-Jun-1945           BSA:          1.603 m Patient Age:    2 years           BP:  131/60 mmHg Patient Gender: F                  HR:           96 bpm. Exam Location:  Inpatient Procedure: 2D Echo Indications:    Syncope  History:        Patient has prior history of Echocardiogram examinations, most                 recent 06/22/2014. Signs/Symptoms:Syncope; Risk                 Factors:Hypertension.  Sonographer:    Harvie Junior Referring Phys: [id number] Vernelle Emerald  Sonographer Comments: Technically difficult study due to poor echo windows. Sypine. IMPRESSIONS  1. Left ventricular ejection fraction, by estimation, is 60 to 65%. The left ventricle has normal function. The left ventricle has no regional wall motion abnormalities. There is moderate asymmetric  left ventricular hypertrophy of the basal-septal segment. Left ventricular diastolic parameters are consistent with Grade I diastolic dysfunction (impaired relaxation).  2. Right ventricular systolic function is normal. The right ventricular size is normal. There is normal pulmonary artery systolic pressure.  3. The mitral valve is normal in structure. No evidence of mitral valve regurgitation. No evidence of mitral stenosis.  4. The aortic valve is tricuspid. Aortic valve regurgitation is mild to moderate. No aortic stenosis is present.  5. The inferior vena cava is normal in size with greater than 50% respiratory variability, suggesting right atrial pressure of 3 mmHg. FINDINGS  Left Ventricle: Left ventricular ejection fraction, by estimation, is 60 to 65%. The left ventricle has normal function. The left ventricle has no regional wall motion abnormalities. The left ventricular internal cavity size was normal in size. There is  moderate asymmetric left ventricular hypertrophy of the basal-septal segment. Left ventricular diastolic parameters are consistent with Grade I diastolic dysfunction (impaired relaxation). Normal left ventricular filling pressure. Right Ventricle: The right ventricular size is normal. Right vetricular wall thickness was not well visualized. Right ventricular systolic function is normal. There is normal pulmonary artery systolic pressure. The tricuspid regurgitant velocity is 2.65 m/s, and with an assumed right atrial pressure of 3 mmHg, the estimated right ventricular systolic pressure is 47.0 mmHg. Left Atrium: Left atrial size was normal in size. Right Atrium: Right atrial size was normal in size. Pericardium: There is no evidence of pericardial effusion. Mitral Valve: The mitral valve is normal in structure. No evidence of mitral valve regurgitation. No evidence of mitral valve stenosis. Tricuspid Valve: The tricuspid valve is not well visualized. Tricuspid valve regurgitation is mild .  No evidence of tricuspid stenosis. Aortic Valve: The aortic valve is tricuspid. Aortic valve regurgitation is mild to moderate. Aortic regurgitation PHT measures 259 msec. No aortic stenosis is present. Aortic valve mean gradient measures 3.0 mmHg. Aortic valve peak gradient measures 6.0 mmHg. Aortic valve area, by VTI measures 3.16 cm. Pulmonic Valve: The pulmonic valve was not well visualized. Pulmonic valve regurgitation is not visualized. No evidence of pulmonic stenosis. Aorta: The aortic root and ascending aorta are structurally normal, with no evidence of dilitation. Venous: The inferior vena cava is normal in size with greater than 50% respiratory variability, suggesting right atrial pressure of 3 mmHg. IAS/Shunts: No atrial level shunt detected by color flow Doppler.  LEFT VENTRICLE PLAX 2D LVIDd:         3.50 cm     Diastology LVIDs:         3.00 cm  LV e' medial:    6.74 cm/s LV PW:         1.00 cm     LV E/e' medial:  8.5 LV IVS:        1.00 cm     LV e' lateral:   8.05 cm/s LVOT diam:     2.00 cm     LV E/e' lateral: 7.1 LV SV:         65 LV SV Index:   40 LVOT Area:     3.14 cm  LV Volumes (MOD) LV vol d, MOD A2C: 66.1 ml LV vol d, MOD A4C: 61.0 ml LV vol s, MOD A2C: 32.7 ml LV vol s, MOD A4C: 30.2 ml LV SV MOD A2C:     33.4 ml LV SV MOD A4C:     61.0 ml LV SV MOD BP:      32.3 ml RIGHT VENTRICLE RV Basal diam:  2.70 cm RV Mid diam:    2.30 cm RV S prime:     14.00 cm/s TAPSE (M-mode): 1.6 cm LEFT ATRIUM             Index        RIGHT ATRIUM          Index LA diam:        2.90 cm 1.81 cm/m   RA Area:     8.75 cm LA Vol (A2C):   27.8 ml 17.34 ml/m  RA Volume:   14.30 ml 8.92 ml/m LA Vol (A4C):   31.3 ml 19.52 ml/m LA Biplane Vol: 29.1 ml 18.15 ml/m  AORTIC VALVE                    PULMONIC VALVE AV Area (Vmax):    3.01 cm     PV Vmax:          0.99 m/s AV Area (Vmean):   3.19 cm     PV Peak grad:     3.9 mmHg AV Area (VTI):     3.16 cm     PR End Diast Vel: 2.65 msec AV Vmax:            122.00 cm/s AV Vmean:          82.800 cm/s AV VTI:            0.205 m AV Peak Grad:      6.0 mmHg AV Mean Grad:      3.0 mmHg LVOT Vmax:         117.00 cm/s LVOT Vmean:        84.000 cm/s LVOT VTI:          0.206 m LVOT/AV VTI ratio: 1.00 AI PHT:            259 msec  AORTA Ao Root diam: 3.50 cm Ao Asc diam:  3.50 cm MITRAL VALVE               TRICUSPID VALVE MV Area (PHT): 4.71 cm    TR Peak grad:   28.1 mmHg MV Decel Time: 161 msec    TR Vmax:        265.00 cm/s MV E velocity: 57.40 cm/s MV A velocity: 94.30 cm/s  SHUNTS MV E/A ratio:  0.61        Systemic VTI:  0.21 m  Systemic Diam: 2.00 cm Carlyle Dolly MD Electronically signed by Carlyle Dolly MD Signature Date/Time: 02/02/2022/12:12:33 PM    Final    DG Chest 1 View  Result Date: 02/02/2022 CLINICAL DATA:  Syncopal episode with suspicion for pneumonia EXAM: CHEST  1 VIEW COMPARISON:  Chest radiograph dated 06/20/2016 FINDINGS: Slightly low lung volumes. Hazy opacity silhouetting the right heart border. The heart size and mediastinal contours are within normal limits. Aortic atherosclerosis. Partially imaged left shoulder arthroplasty and right humeral intramedullary nail. Slightly increased lucency surrounding the proximal aspect of the intramedullary nail. Severe degenerative changes of the right glenohumeral joint. IMPRESSION: 1. Hazy opacity silhouetting the right heart border, which may represent atelectasis or pneumonia. 2. Slightly increased lucency surrounding the proximal aspect of the right humeral intramedullary nail, which could represent loosening. Electronically Signed   By: Darrin Nipper M.D.   On: 02/02/2022 08:49   DG Knee 2 Views Right  Result Date: 02/01/2022 CLINICAL DATA:  Post reduction. EXAM: RIGHT KNEE - 1-2 VIEW COMPARISON:  Earlier radiograph dated 02/01/2022. FINDINGS: Displaced and angulated fracture of the distal right femoral diaphysis above the ostia cc. The hardware is intact. The bones are  osteopenic. There has been interval placement overlying immobilizer. IMPRESSION: Displaced and angulated fracture of the distal right femoral diaphysis. Electronically Signed   By: Anner Crete M.D.   On: 02/01/2022 20:41   DG Hip Unilat W or Wo Pelvis 2-3 Views Right  Result Date: 02/01/2022 CLINICAL DATA:  Fall. EXAM: DG HIP (WITH OR WITHOUT PELVIS) 2-3V RIGHT COMPARISON:  None Available. FINDINGS: Bilateral total hip arthroplasties. The arthroplasty components appear intact and in anatomic alignment. There is no acute fracture or dislocation. The bones are osteopenic. Degenerative changes of the lower lumbar spine. The soft tissues are unremarkable. IMPRESSION: 1. No acute fracture or dislocation. 2. Bilateral total hip arthroplasties. Electronically Signed   By: Anner Crete M.D.   On: 02/01/2022 18:13   DG Knee 2 Views Right  Result Date: 02/01/2022 CLINICAL DATA:  Right knee pain after fall. EXAM: RIGHT KNEE - 1-2 VIEW COMPARISON:  None Available. FINDINGS: Status post right total knee arthroplasty. Severely angulated fracture is seen involving the distal right femoral shaft just above the prosthesis. IMPRESSION: Severely angulated fracture is seen involving the distal right femoral shaft just above prosthesis. Electronically Signed   By: Marijo Conception M.D.   On: 02/01/2022 16:20     Medical Consultants:   None.   Subjective:    Mallory Murphy pain is relatively controlled  Objective:    Vitals:   02/02/22 2300 02/03/22 0910 02/03/22 0915 02/03/22 0930  BP: (!) 128/51 (!) 159/90 (!) 162/80 (!) 173/72  Pulse: 95 100 98 98  Resp: '20 12 17 10  '$ Temp: 99.5 F (37.5 C) 97.9 F (36.6 C)    TempSrc: Oral     SpO2: 95% 93% 93% 98%  Weight:      Height:       SpO2: 98 % O2 Flow Rate (L/min): 2 L/min   Intake/Output Summary (Last 24 hours) at 02/03/2022 0941 Last data filed at 02/03/2022 0842 Gross per 24 hour  Intake 1020.34 ml  Output 150 ml  Net 870.34 ml    Filed Weights   02/01/22 1542  Weight: 61.7 kg    Exam: General exam: In no acute distress. Respiratory system: Good air movement and clear to auscultation. Cardiovascular system: S1 & S2 heard, RRR. No JVD. Gastrointestinal system: Abdomen is nondistended, soft and  nontender.  Extremities: No pedal edema. Psychiatry: Judgement and insight appear normal. Mood & affect appropriate.    Data Reviewed:    Labs: Basic Metabolic Panel: Recent Labs  Lab 01/28/22 1351 02/01/22 1756 02/02/22 0336 02/03/22 0258  NA 137 135 137 136  K 4.1 4.2 4.0 3.8  CL 106 106 106 106  CO2 '23 22 23 22  '$ GLUCOSE 113* 109* 126* 109*  BUN 22 22 24* 23  CREATININE 0.87 0.97 1.09* 1.18*  CALCIUM 8.2* 8.2* 8.2* 8.1*  MG  --   --  1.8 1.7  PHOS  --   --  3.9  --    GFR Estimated Creatinine Clearance: 34.7 mL/min (A) (by C-G formula based on SCr of 1.18 mg/dL (H)). Liver Function Tests: Recent Labs  Lab 01/28/22 1351 02/01/22 1756 02/02/22 0336 02/03/22 0258  AST '26 27 23 24  '$ ALT '13 12 13 13  '$ ALKPHOS 79 73 71 60  BILITOT 0.3 0.9 0.7 0.4  PROT 7.5 7.2 6.6 5.9*  ALBUMIN 3.5 3.4* 3.2* 2.7*   No results for input(s): "LIPASE", "AMYLASE" in the last 168 hours. No results for input(s): "AMMONIA" in the last 168 hours. Coagulation profile Recent Labs  Lab 02/03/22 0258  INR 1.2   COVID-19 Labs  No results for input(s): "DDIMER", "FERRITIN", "LDH", "CRP" in the last 72 hours.  Lab Results  Component Value Date   SARSCOV2NAA NEGATIVE 02/05/2020   [place] NEGATIVE 01/30/2020    CBC: Recent Labs  Lab 01/28/22 1351 02/01/22 1641 02/02/22 0336 02/03/22 0258  WBC 9.5 17.0* 13.0* 10.7*  NEUTROABS 5.6 13.9* 8.9* 6.4  HGB 11.6* 12.0 10.0* 8.5*  HCT 34.8* 36.4 30.4* 25.8*  MCV 90.4 90.5 91.3 92.5  PLT 243 269 223 188   Cardiac Enzymes: No results for input(s): "CKTOTAL", "CKMB", "CKMBINDEX", "TROPONINI" in the last 168 hours. BNP (last 3 results) No results for input(s):  "PROBNP" in the last 8760 hours. CBG: No results for input(s): "GLUCAP" in the last 168 hours. D-Dimer: No results for input(s): "DDIMER" in the last 72 hours. Hgb A1c: No results for input(s): "HGBA1C" in the last 72 hours. Lipid Profile: No results for input(s): "CHOL", "HDL", "LDLCALC", "TRIG", "CHOLHDL", "LDLDIRECT" in the last 72 hours. Thyroid function studies: No results for input(s): "TSH", "T4TOTAL", "T3FREE", "THYROIDAB" in the last 72 hours.  Invalid input(s): "FREET3" Anemia work up: No results for input(s): "VITAMINB12", "FOLATE", "FERRITIN", "TIBC", "IRON", "RETICCTPCT" in the last 72 hours. Sepsis Labs: Recent Labs  Lab 01/28/22 1351 02/01/22 1641 02/02/22 0336 02/03/22 0258  WBC 9.5 17.0* 13.0* 10.7*   Microbiology Recent Results (from the past 240 hour(s))  Surgical PCR screen     Status: Abnormal   Collection Time: 02/02/22  2:14 AM   Specimen: Nasal Mucosa; Nasal Swab  Result Value Ref Range Status   MRSA, PCR NEGATIVE NEGATIVE Final   Staphylococcus aureus POSITIVE (A) NEGATIVE Final    Comment: RESULT CALLED TO, READ BACK BY AND VERIFIED WITH: ESTEBAN, V @ 0510 [id number] (NOTE) The Xpert SA Assay (FDA approved for NASAL specimens in patients 94 years of age and older), is one component of a comprehensive surveillance program. It is not intended to diagnose infection nor to guide or monitor treatment. Performed at [facility], [place] [address] [place], [place] [address]      Medications:    [MAR Hold] Chlorhexidine Gluconate Cloth  6 each Topical Daily   [MAR Hold] escitalopram  10 mg Oral QHS   [  MAR Hold] losartan  50 mg Oral QHS   [MAR Hold] mupirocin ointment  1 Application Nasal BID   [MAR Hold] oxybutynin  5 mg Oral QHS   povidone-iodine  2 Application Topical Once   Continuous Infusions:    LOS: 2 days   Charlynne Cousins  [facility]  02/03/2022, 9:41 AM

## 2022-02-03 NOTE — Discharge Instructions (Addendum)
Orthopaedic Trauma Service Discharge Instructions   General Discharge Instructions  WEIGHT BEARING STATUS:weightbearing as tolerated  RANGE OF MOTION/ACTIVITY:Ok for knee range of motion as tolerated  Wound Care:You may remove your surgical dressing on Wednesday 02/05/22. Incisions can be left open to air if there is no drainage. If incision continues to have drainage, follow wound care instructions below. Okay to shower if no drainage from incisions.  DVT/PE prophylaxis: Eliquis 2.5 mg twice daily x 30 days  Diet: as you were eating previously.  Can use over the counter stool softeners and bowel preparations, such as Miralax, to help with bowel movements.  Narcotics can be constipating.  Be sure to drink plenty of fluids  PAIN MEDICATION USE AND EXPECTATIONS  You have likely been given narcotic medications to help control your pain.  After a traumatic event that results in an fracture (broken bone) with or without surgery, it is ok to use narcotic pain medications to help control one's pain.  We understand that everyone responds to pain differently and each individual patient will be evaluated on a regular basis for the continued need for narcotic medications. Ideally, narcotic medication use should last no more than 6-8 weeks (coinciding with fracture healing).   As a patient it is your responsibility as well to monitor narcotic medication use and report the amount and frequency you use these medications when you come to your office visit.   We would also advise that if you are using narcotic medications, you should take a dose prior to therapy to maximize you participation.  IF YOU ARE ON NARCOTIC MEDICATIONS IT IS NOT PERMISSIBLE TO OPERATE A MOTOR VEHICLE (MOTORCYCLE/CAR/TRUCK/MOPED) OR HEAVY MACHINERY DO NOT MIX NARCOTICS WITH OTHER CNS (CENTRAL NERVOUS SYSTEM) DEPRESSANTS SUCH AS ALCOHOL   STOP SMOKING OR USING NICOTINE PRODUCTS!!!!  As discussed nicotine severely impairs your  body's ability to heal surgical and traumatic wounds but also impairs bone healing.  Wounds and bone heal by forming microscopic blood vessels (angiogenesis) and nicotine is a vasoconstrictor (essentially, shrinks blood vessels).  Therefore, if vasoconstriction occurs to these microscopic blood vessels they essentially disappear and are unable to deliver necessary nutrients to the healing tissue.  This is one modifiable factor that you can do to dramatically increase your chances of healing your injury.    (This means no smoking, no nicotine gum, patches, etc)  DO NOT USE NONSTEROIDAL ANTI-INFLAMMATORY DRUGS (NSAID'S)  Using products such as Advil (ibuprofen), Aleve (naproxen), Motrin (ibuprofen) for additional pain control during fracture healing can delay and/or prevent the healing response.  If you would like to take over the counter (OTC) medication, Tylenol (acetaminophen) is ok.  However, some narcotic medications that are given for pain control contain acetaminophen as well. Therefore, you should not exceed more than 4000 mg of tylenol in a day if you do not have liver disease.  Also note that there are may OTC medicines, such as cold medicines and allergy medicines that my contain tylenol as well.  If you have any questions about medications and/or interactions please ask your doctor/PA or your pharmacist.      ICE AND ELEVATE INJURED/OPERATIVE EXTREMITY  Using ice and elevating the injured extremity above your heart can help with swelling and pain control.  Icing in a pulsatile fashion, such as 20 minutes on and 20 minutes off, can be followed.    Do not place ice directly on skin. Make sure there is a barrier between to skin and the ice pack.  Using frozen items such as frozen peas works well as the conform nicely to the are that needs to be iced.  USE AN ACE WRAP OR TED HOSE FOR SWELLING CONTROL  In addition to icing and elevation, Ace wraps or TED hose are used to help limit and resolve  swelling.  It is recommended to use Ace wraps or TED hose until you are informed to stop.    When using Ace Wraps start the wrapping distally (farthest away from the body) and wrap proximally (closer to the body)   Example: If you had surgery on your leg or thing and you do not have a splint on, start the ace wrap at the toes and work your way up to the thigh        If you had surgery on your upper extremity and do not have a splint on, start the ace wrap at your fingers and work your way up to the upper arm  [employer]: [phone]   VISIT OUR WEBSITE FOR ADDITIONAL INFORMATION: [email or web address]     Discharge Wound Care Instructions  Do NOT apply any ointments, solutions or lotions to pin sites or surgical wounds.  These prevent needed drainage and even though solutions like hydrogen peroxide kill bacteria, they also damage cells lining the pin sites that help fight infection.  Applying lotions or ointments can keep the wounds moist and can cause them to breakdown and open up as well. This can increase the risk for infection. When in doubt call the office.  If any drainage is noted, use one layer of adaptic or Mepitel, then gauze, Kerlix, and an ace wrap. - These dressing supplies should be available at local medical supply stores [facility], [facility], etc) as well as Management consultant ([facility], [facility], [place], etc)  Once the incision is completely dry and without drainage, it may be left open to air out.  Showering may begin 36-48 hours later.  Cleaning gently with soap and water.

## 2022-02-03 NOTE — Anesthesia Preprocedure Evaluation (Signed)
Anesthesia Evaluation  Patient identified by MRN, date of birth, ID band Patient awake    Reviewed: Allergy & Precautions, H&P , NPO status , Patient's Chart, lab work & pertinent test results  Airway Mallampati: II   Neck ROM: full    Dental   Pulmonary neg pulmonary ROS,    breath sounds clear to auscultation       Cardiovascular hypertension,  Rhythm:regular Rate:Normal     Neuro/Psych    GI/Hepatic   Endo/Other    Renal/GU      Musculoskeletal  (+) Arthritis ,   Abdominal   Peds  Hematology  (+) Blood dyscrasia, anemia ,   Anesthesia Other Findings   Reproductive/Obstetrics                             Anesthesia Physical Anesthesia Plan  ASA: 3  Anesthesia Plan: General   Post-op Pain Management:    Induction: Intravenous  PONV Risk Score and Plan: 3 and Ondansetron, Dexamethasone and Treatment may vary due to age or medical condition  Airway Management Planned: LMA  Additional Equipment:   Intra-op Plan:   Post-operative Plan: Extubation in OR  Informed Consent: I have reviewed the patients History and Physical, chart, labs and discussed the procedure including the risks, benefits and alternatives for the proposed anesthesia with the patient or authorized representative who has indicated his/her understanding and acceptance.     Dental advisory given  Plan Discussed with: CRNA, Anesthesiologist and Surgeon  Anesthesia Plan Comments:         Anesthesia Quick Evaluation

## 2022-02-03 NOTE — Consult Note (Signed)
Orthopaedic Trauma Service (OTS) Consult   Patient ID: Mallory Murphy MRN: [id number] DOB/AGE: Jan 07, 1946 76 y.o.  Reason for Consult:Right periprosthetic femur fracture Referring Physician: Dr. Adriana Mccallum, MD [facility]  HPI: Mallory Murphy is an 76 y.o. female who is being seen in consultation at the request of Dr. Alvan Dame.  For evaluation of right periprosthetic distal femur fracture.  Patient has a history of rheumatoid arthritis.  She sustained a ground-level fall after she apparently passed out.  Was found to have a periprosthetic distal femur fracture.  She was brought to [facility] emergency room.  Due to complexity of her injury it was felt that orthopedic traumatology need to be involved.  As result she was transferred to [facility] for definitive fixation.  Patient was seen and evaluated in preoperative holding area.  Patient currently comfortable.  Denies any significant medical problems other than her rheumatoid arthritis for which she takes number of medications.  She does have a history of anemia as well as chronic kidney disease.  Patient denies any other injuries.  She has had multiple replacement surgeries on a number of her joints in her body.  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 No pertinent family history.  Social History:  reports that she has never smoked. She has never used smokeless tobacco. She reports that she does not currently use alcohol. She reports that she does not use drugs.  Allergies:  Allergies  Allergen Reactions   Macrobid [Nitrofurantoin Macrocrystal] Nausea Only    Medications:  No current facility-administered medications on file prior to encounter.   Current Outpatient Medications on File Prior to Encounter  Medication Sig Dispense Refill   Darbepoetin Alfa-Albumin (ARANESP IJ) Inject 300 mcg into the skin See admin instructions. 300 mcg into the skin every 3 months for Hgb <11     diphenhydrAMINE (BENADRYL) 25 MG tablet Take 25 mg by mouth at bedtime.     escitalopram (LEXAPRO) 10 MG tablet Take 10 mg by mouth at bedtime.     leflunomide (ARAVA) 20 MG tablet Take 20 mg by mouth at bedtime.     oxybutynin (DITROPAN) 5 MG tablet Take 5 mg by mouth at bedtime.     SIMPONI ARIA 50 MG/4ML SOLN injection Inject into the vein every 8 (eight) weeks.     losartan (COZAAR) 50 MG tablet Take 50 mg by mouth at bedtime.       ROS: Constitutional: No fever or chills Vision: No changes in vision ENT: No difficulty swallowing CV: No chest pain Pulm: No SOB or wheezing GI: No nausea or vomiting GU: No  urgency or inability to hold urine Skin: No poor wound healing Neurologic: No numbness or tingling Psychiatric: No depression or anxiety Heme: No bruising Allergic: No reaction to medications or food   Exam: Blood pressure (!) 128/51, pulse 95, temperature 99.5 F (37.5 C), temperature source Oral, resp. rate 20, height '5\' 1"'$  (1.549 m), weight 61.7 kg, SpO2 95 %. General: No acute distress Orientation: Awake alert and oriented x3 Mood and Affect: Cooperative and pleasant Gait: Unable to assess due to her fracture Coordination and balance: Within  normal limits  Right lower extremity: Knee immobilizer in place, skin is intact.  No signs of any erythema or lesions.  Incisions are clean dry and intact from previous and healed.  Compartments are soft compressible.  She has active dorsiflexion plantarflexion of her foot and ankle.  She has warm well-perfused foot with 3+ DP pulses.  No other injuries noted throughout the lower extremity.  Left lower extremity: Skin without lesions. No tenderness to palpation. Full painless ROM, full strength in each muscle groups without evidence of instability.   Medical Decision Making: Data: Imaging: X-rays of the right femur and knee are reviewed which show a periprosthetic distal femur fracture with mild displacement and angulation.  Labs:  Results for orders placed or performed during the hospital encounter of 02/01/22 (from the past 24 hour(s))  Type and screen     Status: None   Collection Time: 02/03/22  2:55 AM  Result Value Ref Range   ABO/RH(D) O POS    Antibody Screen NEG    Sample Expiration      02/06/2022,2359 Performed at [facility] [address] [place], [place] [address]   APTT     Status: None   Collection Time: 02/03/22  2:58 AM  Result Value Ref Range   aPTT 30 24 - 36 seconds  Protime-INR     Status: None   Collection Time: 02/03/22  2:58 AM  Result Value Ref Range   Prothrombin Time 15.1 11.4 - 15.2 seconds   INR 1.2 0.8 - 1.2  CBC with Differential/Platelet     Status: Abnormal   Collection Time: 02/03/22  2:58 AM  Result Value Ref Range   WBC 10.7 (H) 4.0 - 10.5 K/uL   RBC 2.79 (L) 3.87 - 5.11 MIL/uL   Hemoglobin 8.5 (L) 12.0 - 15.0 g/dL   HCT 25.8 (L) 36.0 - 46.0 %   MCV 92.5 80.0 - 100.0 fL   MCH 30.5 26.0 - 34.0 pg   MCHC 32.9 30.0 - 36.0 g/dL   RDW 13.4 11.5 - 15.5 %   Platelets 188 150 - 400 K/uL   nRBC 0.0 0.0 - 0.2 %   Neutrophils Relative % 61 %   Neutro Abs 6.4 1.7 - 7.7 K/uL   Lymphocytes Relative 19 %   Lymphs Abs 2.0 0.7 - 4.0 K/uL    Monocytes Relative 17 %   Monocytes Absolute 1.9 (H) 0.1 - 1.0 K/uL   Eosinophils Relative 3 %   Eosinophils Absolute 0.3 0.0 - 0.5 K/uL   Basophils Relative 0 %   Basophils Absolute 0.0 0.0 - 0.1 K/uL   Immature Granulocytes 0 %   Abs Immature Granulocytes 0.03 0.00 - 0.07 K/uL  Comprehensive metabolic panel     Status: Abnormal   Collection Time: 02/03/22  2:58 AM  Result Value Ref Range   Sodium 136 135 - 145 mmol/L   Potassium 3.8 3.5 - 5.1 mmol/L   Chloride 106 98 -  111 mmol/L   CO2 22 22 - 32 mmol/L   Glucose, Bld 109 (H) 70 - 99 mg/dL   BUN 23 8 - 23 mg/dL   Creatinine, Ser 1.18 (H) 0.44 - 1.00 mg/dL   Calcium 8.1 (L) 8.9 - 10.3 mg/dL   Total Protein 5.9 (L) 6.5 - 8.1 g/dL   Albumin 2.7 (L) 3.5 - 5.0 g/dL   AST 24 15 - 41 U/L   ALT 13 0 - 44 U/L   Alkaline Phosphatase 60 38 - 126 U/L   Total Bilirubin 0.4 0.3 - 1.2 mg/dL   GFR, Estimated 48 (L) >60 mL/min   Anion gap 8 5 - 15  Magnesium     Status: None   Collection Time: 02/03/22  2:58 AM  Result Value Ref Range   Magnesium 1.7 1.7 - 2.4 mg/dL     Imaging or Labs ordered: None  Medical history and chart was reviewed and case discussed with medical provider.  Assessment/Plan: 76 year old female with right periprosthetic distal femur fracture.  Due to the unstable nature of her injury I recommend proceeding with open reduction internal fixation.  Risks and benefits were discussed with the patient.  Risks included but not limited to bleeding, infection, malunion, nonunion, hardware failure, hardware irritation, nerve or blood vessel injury, DVT, even the possibility anesthetic complications.  She agreed to proceed with surgery and consent was obtained.  Shona Needles, MD [facility] [phone] (office) [email or web address]

## 2022-02-03 NOTE — Progress Notes (Signed)
Pt left via bed for OR. IV ABX are on bed. Pts niece '@bedside'$ . Pt and family stated that they understood procedure and signed consent on previous shift. Made them aware to ask any further questions before procedure started.   Pt has been NPO and hibiclens bath given.

## 2022-02-04 ENCOUNTER — Encounter (HOSPITAL_COMMUNITY): Payer: Self-pay | Admitting: Student

## 2022-02-04 DIAGNOSIS — S72444A Nondisplaced fracture of lower epiphysis (separation) of right femur, initial encounter for closed fracture: Secondary | ICD-10-CM | POA: Diagnosis not present

## 2022-02-04 LAB — CBC
HCT: 24.3 % — ABNORMAL LOW (ref 36.0–46.0)
Hemoglobin: 8 g/dL — ABNORMAL LOW (ref 12.0–15.0)
MCH: 30 pg (ref 26.0–34.0)
MCHC: 32.9 g/dL (ref 30.0–36.0)
MCV: 91 fL (ref 80.0–100.0)
Platelets: 178 10*3/uL (ref 150–400)
RBC: 2.67 MIL/uL — ABNORMAL LOW (ref 3.87–5.11)
RDW: 13.2 % (ref 11.5–15.5)
WBC: 18.7 10*3/uL — ABNORMAL HIGH (ref 4.0–10.5)
nRBC: 0 % (ref 0.0–0.2)

## 2022-02-04 LAB — BASIC METABOLIC PANEL
Anion gap: 5 (ref 5–15)
BUN: 14 mg/dL (ref 8–23)
CO2: 23 mmol/L (ref 22–32)
Calcium: 8.6 mg/dL — ABNORMAL LOW (ref 8.9–10.3)
Chloride: 106 mmol/L (ref 98–111)
Creatinine, Ser: 0.94 mg/dL (ref 0.44–1.00)
GFR, Estimated: >60 mL/min (ref >60)
Glucose, Bld: 146 mg/dL — ABNORMAL HIGH (ref 70–99)
Potassium: 3.8 mmol/L (ref 3.5–5.1)
Sodium: 134 mmol/L — ABNORMAL LOW (ref 135–145)

## 2022-02-04 NOTE — TOC Initial Note (Signed)
Transition of Care [facility]) - Initial/Assessment Note    Patient Details  Name: Mallory Murphy MRN: [id number] Date of Birth: [birth year]  [facility] CM/SW Contact:    Mallory Chars, LCSW Phone Number: 02/04/2022, 3:31 PM  Clinical Narrative:    CSW met with pt regarding DC recommendation for SNF.  Pt agreeable to this, has been to [name] in the past and returning there would be first choice.  Pt lives alone, no services.  Contact is niece Mallory Murphy, permission given to speak with her.  Permission given to send out referral in hub.  Pt has been vaccinated for covid with multiple boosters.                Expected Discharge Plan: [place] Barriers to Discharge: Continued Medical Work up, SNF Pending bed offer   Patient Goals and CMS Choice Patient states their goals for this hospitalization and ongoing recovery are:: get back to what I was before   Choice offered to / list presented to : Patient (first choice is [name])  Expected Discharge Plan and Services Expected Discharge Plan: [place] In-house Referral: Clinical Social Work   Post Acute Care Choice: [place] Living arrangements for the past 2 months: [place]                                      Prior Living Arrangements/Services Living arrangements for the past 2 months: Single Family Home Lives with:: Self Patient language and need for interpreter reviewed:: Yes Do you feel safe going back to the place where you live?: Yes      Need for Family Participation in Patient Care: Yes (Comment) Care giver support system in place?: Yes (comment) Current home services: Other (comment) (none) Criminal Activity/Legal Involvement Pertinent to Current Situation/Hospitalization: No - Comment as needed  Activities of Daily Living Home Assistive Devices/Equipment: Walker (specify type), Cane (specify quad or straight) (front wheel  walker) ADL Screening (condition at time of admission) Patient's cognitive ability adequate to safely complete daily activities?: Yes Is the patient deaf or have difficulty hearing?: No Does the patient have difficulty seeing, even when wearing glasses/contacts?: No Does the patient have difficulty concentrating, remembering, or making decisions?: No Patient able to express need for assistance with ADLs?: Yes Does the patient have difficulty dressing or bathing?: Yes Independently performs ADLs?: No Communication: Independent Dressing (OT): Needs assistance Is this a change from baseline?: Change from baseline, expected to last >3 days Grooming: Independent Feeding: Independent Bathing: Needs assistance Is this a change from baseline?: Change from baseline, expected to last >3 days Toileting: Needs assistance Is this a change from baseline?: Change from baseline, expected to last >3days In/Out Bed: Needs assistance Is this a change from baseline?: Change from baseline, expected to last >3 days Walks in Home: Needs assistance Is this a change from baseline?: Change from baseline, expected to last >3 days Does the patient have difficulty walking or climbing stairs?: Yes Weakness of Legs: Both Weakness of Arms/Hands: Right  Permission Sought/Granted Permission sought to share information with : Family Supports Permission granted to share information with : Yes, Verbal Permission Granted  Share Information with NAME: Niece Mallory Murphy  Permission granted to share info w AGENCY: SNF        Emotional Assessment Appearance:: Appears stated age Attitude/Demeanor/Rapport: Engaged Affect (typically observed): Appropriate, Pleasant Orientation: :  Oriented to Self, Oriented to Place, Oriented to  Time, Oriented to Situation      Admission diagnosis:  Femur fracture, right ([place]) [S72.91XA] Fall, initial encounter [W19.XXXA] Patient Active Problem List   Diagnosis Date Noted    Leukocytosis 02/02/2022   Normocytic anemia 02/02/2022   Femur fracture, right ([place]) 02/01/2022   Sacral fracture ([place]) 01/31/2020   Hypertensive urgency 01/31/2020   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   Chronic kidney disease, stage 3a ([place]) 01/20/2019   Iron deficiency anemia due to chronic blood loss 11/19/2018   OA (osteoarthritis) of knee 05/26/2016   AKI (acute kidney injury) ([place]) 06/21/2014   Dehydration 06/21/2014   Orthostatic syncope 06/21/2014   Hypokalemia 06/21/2014   Palpitation 06/21/2014   HTN (hypertension) 05/07/2012   Arthritis, rheumatoid ([place]) 05/07/2012   PCP:  Seward Carol, MD Pharmacy:   [id number] - [place], [place] - [address] AT [place] [address] [place] [address] Phone: [phone] Fax: [phone]     Social Determinants of Health (SDOH) Interventions    Readmission Risk Interventions     No data to display

## 2022-02-04 NOTE — Progress Notes (Signed)
[facility] PROGRESS NOTE    Progress Note  Harry Bark  EXB:[id number] DOB: 1945/04/28 DOA: 02/01/2022 PCP: Seward Carol, MD     Brief Narrative:   Mallory Murphy is an 76 y.o. female past medical history significant for iron deficiency anemia, rheumatoid arthritis comes in after experiencing a syncopal episode work-up revealed a right distal femur fracture, orthopedic surgery was consulted who recommended or are I have on 02/03/2022   Assessment/Plan:   Femur fracture, right ([place]) Ortho was consulted she is status post open reduction with internal fixation of the right distal femur. Narcotics and bowel regimen. Physical therapy eval is pending.  Orthostatic syncope Likely the cause of her fall. Resolved with fluid resuscitation.  Chronic kidney disease, stage 3a (HCC) Creatinine appears to be at baseline  Normocytic anemia Hemoglobin today is 8.5 significant drop from admission of 12. She relates no signs of overt bleeding. To get CBC tomorrow morning if hemoglobin continues to drop we will go ahead and transfuse.  She relates she is currently asymptomatic we will see how she does with physical therapy.  Leukocytosis Likely reactive.  From traumatic event. Has remained afebrile,   DVT prophylaxis: lovenox Family Communication:none Status is: Inpatient Remains inpatient appropriate because:  will need SNF    Code Status:     Code Status Orders  (From admission, onward)           Start     Ordered   02/02/22 0018  Full code  Continuous        02/02/22 0017           Code Status History     Date Active Date Inactive Code Status Order ID Comments User Context   01/31/2020 0940 02/06/2020 1700 Full Code [id number]  Harold Hedge, MD Inpatient   05/26/2016 1713 05/28/2016 1639 Full Code [id number]  Gaynelle Arabian, MD Inpatient   06/21/2014 1725 06/22/2014 1914 Full Code [id number]  Kelvin Cellar, MD Inpatient         IV Access:    Peripheral IV   Procedures and diagnostic studies:   DG FEMUR PORT, MIN 2 VIEWS RIGHT  Result Date: 02/03/2022 CLINICAL DATA:  Femur fracture. EXAM: RIGHT FEMUR PORTABLE 2 VIEW COMPARISON:  Intraoperative fluoroscopy ORIF of right femur 02/03/2022, pelvis and right hip radiographs 02/01/2022, right knee radiographs 02/01/2022 (multiple studies) FINDINGS: Redemonstration of total right hip arthroplasty. 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 New long lateral plate and screw fixation of the distal fibula, traversing the known comminuted and displaced/angulated distal femoral fracture just proximal to total right knee arthroplasty hardware. There is improved alignment with persistent mild posterior approximately 5 mm displacement of the distal fracture component with respect to the proximal fracture component. There is diffuse decreased bone mineralization. IMPRESSION: New lateral plate and screw fixation of the distal fibula, traversing the known comminuted and displaced/angulated distal femoral fracture. There is improved alignment. Electronically Signed   By: Yvonne Kendall M.D.   On: 02/03/2022 10:17   DG FEMUR, MIN 2 VIEWS RIGHT  Result Date: 02/03/2022 CLINICAL DATA:  [id number] Elective surgery [id number] EXAM: RIGHT FEMUR 2 VIEWS COMPARISON:  None Available. FINDINGS: Intraoperative fluoroscopic images during lateral plate fixation of the distal femur. Partially visualized right hip arthroplasty. Prior total knee arthroplasty. Improved fracture alignment. No immediate complication. IMPRESSION: Intraoperative fluoroscopic images during plate fixation of the distal femur. Improved fracture alignment. No  evidence of immediate complication. Electronically Signed   By: Maurine Simmering M.D.   On: 02/03/2022 09:00   DG C-Arm 1-60 Min-No Report  Result Date: 02/03/2022 Fluoroscopy was utilized  by the requesting physician.  No radiographic interpretation.   DG C-Arm 1-60 Min-No Report  Result Date: 02/03/2022 Fluoroscopy was utilized by the requesting physician.  No radiographic interpretation.     Medical Consultants:   None.   Subjective:    Macaria Bias Drinkard pain is controlled has not had a bowel movement.  Objective:    Vitals:   02/03/22 1016 02/03/22 2011 02/03/22 2235 02/04/22 0438  BP: (!) 147/95 (!) 151/77  (!) 152/70  Pulse: 97 92  77  Resp: '15 16  16  '$ Temp: 98.1 F (36.7 C) 98.2 F (36.8 C)    TempSrc: Oral  Oral   SpO2: 100% 95%  98%  Weight:      Height:       SpO2: 98 % O2 Flow Rate (L/min): 2 L/min   Intake/Output Summary (Last 24 hours) at 02/04/2022 1009 Last data filed at 02/04/2022 0554 Gross per 24 hour  Intake 1484.25 ml  Output 950 ml  Net 534.25 ml    Filed Weights   02/01/22 1542  Weight: 61.7 kg    Exam: General exam: In no acute distress. Respiratory system: Good air movement and clear to auscultation. Cardiovascular system: S1 & S2 heard, RRR. No JVD. Gastrointestinal system: Abdomen is nondistended, soft and nontender.  Extremities: No pedal edema. Skin: No rashes, lesions or ulcers Psychiatry: Judgement and insight appear normal. Mood & affect appropriate.   Data Reviewed:    Labs: Basic Metabolic Panel: Recent Labs  Lab 01/28/22 1351 02/01/22 1756 02/02/22 0336 02/03/22 0258 02/04/22 0342  NA 137 135 137 136 134*  K 4.1 4.2 4.0 3.8 3.8  CL 106 106 106 106 106  CO2 '23 22 23 22 23  '$ GLUCOSE 113* 109* 126* 109* 146*  BUN 22 22 24* 23 14  CREATININE 0.87 0.97 1.09* 1.18* 0.94  CALCIUM 8.2* 8.2* 8.2* 8.1* 8.6*  MG  --   --  1.8 1.7  --   PHOS  --   --  3.9  --   --     GFR Estimated Creatinine Clearance: 43.6 mL/min (by C-G formula based on SCr of 0.94 mg/dL). Liver Function Tests: Recent Labs  Lab 01/28/22 1351 02/01/22 1756 02/02/22 0336 02/03/22 0258  AST '26 27 23 24  '$ ALT '13 12 13 13   '$ ALKPHOS 79 73 71 60  BILITOT 0.3 0.9 0.7 0.4  PROT 7.5 7.2 6.6 5.9*  ALBUMIN 3.5 3.4* 3.2* 2.7*    No results for input(s): "LIPASE", "AMYLASE" in the last 168 hours. No results for input(s): "AMMONIA" in the last 168 hours. Coagulation profile Recent Labs  Lab 02/03/22 0258  INR 1.2    COVID-19 Labs  No results for input(s): "DDIMER", "FERRITIN", "LDH", "CRP" in the last 72 hours.  Lab Results  Component Value Date   [place] NEGATIVE 02/05/2020   [place] NEGATIVE 01/30/2020    CBC: Recent Labs  Lab 01/28/22 1351 02/01/22 1641 02/02/22 0336 02/03/22 0258 02/04/22 0342  WBC 9.5 17.0* 13.0* 10.7* 18.7*  NEUTROABS 5.6 13.9* 8.9* 6.4  --   HGB 11.6* 12.0 10.0* 8.5* 8.0*  HCT 34.8* 36.4 30.4* 25.8* 24.3*  MCV 90.4 90.5 91.3 92.5 91.0  PLT 243 269 223 188 178    Cardiac Enzymes: No results for input(s): "CKTOTAL", "CKMB", "CKMBINDEX", "  TROPONINI" in the last 168 hours. BNP (last 3 results) No results for input(s): "PROBNP" in the last 8760 hours. CBG: No results for input(s): "GLUCAP" in the last 168 hours. D-Dimer: No results for input(s): "DDIMER" in the last 72 hours. Hgb A1c: No results for input(s): "HGBA1C" in the last 72 hours. Lipid Profile: No results for input(s): "CHOL", "HDL", "LDLCALC", "TRIG", "CHOLHDL", "LDLDIRECT" in the last 72 hours. Thyroid function studies: No results for input(s): "TSH", "T4TOTAL", "T3FREE", "THYROIDAB" in the last 72 hours.  Invalid input(s): "FREET3" Anemia work up: No results for input(s): "VITAMINB12", "FOLATE", "FERRITIN", "TIBC", "IRON", "RETICCTPCT" in the last 72 hours. Sepsis Labs: Recent Labs  Lab 02/01/22 1641 02/02/22 0336 02/03/22 0258 02/04/22 0342  WBC 17.0* 13.0* 10.7* 18.7*    Microbiology Recent Results (from the past 240 hour(s))  Surgical PCR screen     Status: Abnormal   Collection Time: 02/02/22  2:14 AM   Specimen: Nasal Mucosa; Nasal Swab  Result Value Ref Range Status   MRSA,  PCR NEGATIVE NEGATIVE Final   Staphylococcus aureus POSITIVE (A) NEGATIVE Final    Comment: RESULT CALLED TO, READ BACK BY AND VERIFIED WITH: ESTEBAN, V @ 0510 [id number] [id number] (NOTE) The Xpert SA Assay (FDA approved for NASAL specimens in patients 22 years of age and older), is one component of a comprehensive surveillance program. It is not intended to diagnose infection nor to guide or monitor treatment. Performed at [facility], [place] [address], [place], [place] [address]      Medications:    Chlorhexidine Gluconate Cloth  6 each Topical Daily   docusate sodium  100 mg Oral BID   enoxaparin (LOVENOX) injection  40 mg Subcutaneous Q24H   escitalopram  10 mg Oral QHS   losartan  50 mg Oral QHS   mupirocin ointment  1 Application Nasal BID   oxybutynin  5 mg Oral QHS   polyethylene glycol  17 g Oral BID   Continuous Infusions:  sodium chloride 75 mL/hr at 02/03/22 1046      LOS: 3 days   Charlynne Cousins  [facility]  02/04/2022, 10:09 AM

## 2022-02-04 NOTE — Progress Notes (Signed)
Orthopaedic Trauma Progress Note  SUBJECTIVE: Doing well this morning.  Pain controlled.  No chest pain. No SOB. No nausea/vomiting. No other complaints.  About to get up with occupational therapy.  Patient notes that she lives alone and off of the top of her head cannot think of someone that would be able to stay with her immediately at discharge.  OBJECTIVE:  Vitals:   02/03/22 2011 02/04/22 0438  BP: (!) 151/77 (!) 152/70  Pulse: 92 77  Resp: 16 16  Temp: 98.2 F (36.8 C)   SpO2: 95% 98%    General: Sitting up in bed, no acute distress Respiratory: No increased work of breathing.  Right lower extremity: Dressing clean, dry, intact.  Mildly sore throughout the thigh but no significant pain with palpation.  Ankle dorsiflexion/plantarflexion intact.  Hesitant about attempted knee motion.  Able to wiggle the toes.  Endorses sensation throughout all aspects of the lower leg, ankle, foot.  Compartments soft and compressible.  Foot warm and well-perfused.  2+ DP pulse  IMAGING: Stable post op imaging.   LABS:  Results for orders placed or performed during the hospital encounter of 02/01/22 (from the past 24 hour(s))  CBC     Status: Abnormal   Collection Time: 02/04/22  3:42 AM  Result Value Ref Range   WBC 18.7 (H) 4.0 - 10.5 K/uL   RBC 2.67 (L) 3.87 - 5.11 MIL/uL   Hemoglobin 8.0 (L) 12.0 - 15.0 g/dL   HCT 24.3 (L) 36.0 - 46.0 %   MCV 91.0 80.0 - 100.0 fL   MCH 30.0 26.0 - 34.0 pg   MCHC 32.9 30.0 - 36.0 g/dL   RDW 13.2 11.5 - 15.5 %   Platelets 178 150 - 400 K/uL   nRBC 0.0 0.0 - 0.2 %  Basic metabolic panel     Status: Abnormal   Collection Time: 02/04/22  3:42 AM  Result Value Ref Range   Sodium 134 (L) 135 - 145 mmol/L   Potassium 3.8 3.5 - 5.1 mmol/L   Chloride 106 98 - 111 mmol/L   CO2 23 22 - 32 mmol/L   Glucose, Bld 146 (H) 70 - 99 mg/dL   BUN 14 8 - 23 mg/dL   Creatinine, Ser 0.94 0.44 - 1.00 mg/dL   Calcium 8.6 (L) 8.9 - 10.3 mg/dL   GFR, Estimated >60 >60  mL/min   Anion gap 5 5 - 15    ASSESSMENT: Mallory Murphy is a 76 y.o. female, 1 Day Post-Op s/p OPEN REDUCTION INTERNAL FIXATION OF RIGHT DISTAL PERI-PROSTHETIC FEMUR FRACTURE  CV/Blood loss: Acute blood loss anemia, Hgb 8.0 this morning. Hemodynamically stable  PLAN: Weightbearing: WBAT RLE ROM: Okay for unrestricted knee ROM Incisional and dressing care: Reinforce dressings as needed  Showering: Hold off on showering for now.  Okay for incisions to begin getting wet 02/06/2022 Orthopedic device(s): None  Pain management:  1. Tylenol 650 mg q 6 hours PRN 2. Percocet 5-325 mg q 4 hours PRN 3. Dilaudid 0.5 mg q 4 hours PRN VTE prophylaxis: Lovenox, SCDs ID:  Ancef 2gm post op Foley/Lines:  No foley, KVO IVFs Impediments to Fracture Healing: Vitamin D level 33, no additional supplementation indicated Dispo: PT/OT evaluation today, dispo pending.  Will likely require SNF.  Okay for discharge from ortho standpoint once cleared by medicine team and therapies D/C recommendations: -Percocet for pain control -Eliquis 2.5 mg twice daily x30 days for DVT prophylaxis -No need for additional Vit D supplementation  Follow -  up plan: 2 weeks after discharge for wound check and repeat x-rays   Contact information:  Katha Hamming MD, Rushie Nyhan PA-C. After hours and holidays please check [email or web address] for group call information for [facility]   Gwinda Passe, PA-C [phone] (office) [email or web address]

## 2022-02-04 NOTE — Progress Notes (Signed)
Mobility Specialist Progress Note   02/04/22 1814  Orthostatic Lying   BP- Lying 135/68  Orthostatic Sitting  BP- Sitting 135/55  Orthostatic Standing at 0 minutes  BP- Standing at 0 minutes 122/55  Orthostatic Standing at 3 minutes  BP- Standing at 3 minutes 162/57  Mobility  Activity Stood at bedside  Level of Assistance Minimal assist, patient does 75% or more  Assistive Device Front wheel walker  RLE Weight Bearing WBAT  Activity Response Tolerated well  $Mobility charge 1 Mobility   Received pt in bed c/o minimal RLE pain but agreeable. Pt reporting no major symptoms besides pain during orthostatics. Required minA for RLE getting EOB and getting back supine. Left supine w/ call bell in reach and bed alarm on.   Holland Falling Mobility Specialist Acute Rehab Office:  [phone]

## 2022-02-04 NOTE — NC FL2 (Signed)
[place] LEVEL OF CARE SCREENING TOOL     IDENTIFICATION  Patient Name: Mallory Murphy Birthdate: 28-Nov-1945 Sex: female Admission Date (Current Location): 02/01/2022  [facility] and [place] Number:  Herbalist and Address:  [facility], [place] [address] [place], [place] [address]      Provider Number: [id number]  Attending Physician Name and Address:  Charlynne Cousins, MD  Relative Name and Phone Number:  Wright,[name] Niece [phone]    Current Level of Care: Hospital Recommended Level of Care: [place] Prior Approval Number:    Date Approved/Denied:   PASRR Number: [id number]  Discharge Plan: SNF    Current Diagnoses: Patient Active Problem List   Diagnosis Date Noted   Leukocytosis 02/02/2022   Normocytic anemia 02/02/2022   Femur fracture, right ([place]) 02/01/2022   Sacral fracture ([place]) 01/31/2020   Hypertensive urgency 01/31/2020   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   Chronic kidney disease, stage 3a ([place]) 01/20/2019   Iron deficiency anemia due to chronic blood loss 11/19/2018   OA (osteoarthritis) of knee 05/26/2016   AKI (acute kidney injury) ([place]) 06/21/2014   Dehydration 06/21/2014   Orthostatic syncope 06/21/2014   Hypokalemia 06/21/2014   Palpitation 06/21/2014   HTN (hypertension) 05/07/2012   Arthritis, rheumatoid ([place]) 05/07/2012    Orientation RESPIRATION BLADDER Height & Weight     Self, Time, Situation, Place  O2 Continent, External catheter Weight: 136 lb (61.7 kg) Height:  '5\' 1"'$  (154.9 cm)  BEHAVIORAL SYMPTOMS/MOOD NEUROLOGICAL BOWEL NUTRITION STATUS      Continent Diet (see discharge summary)  AMBULATORY STATUS COMMUNICATION OF NEEDS Skin   Limited Assist Verbally Surgical wounds                       Personal Care Assistance Level of Assistance  Bathing, Feeding, Dressing Bathing Assistance: Maximum assistance Feeding assistance: Limited  assistance Dressing Assistance: Maximum assistance     Functional Limitations Info  Sight, Hearing, Speech Sight Info: Adequate Hearing Info: Adequate Speech Info: Adequate    SPECIAL CARE FACTORS FREQUENCY  PT (By licensed PT), OT (By licensed OT)     PT Frequency: 5x week OT Frequency: 5x week            Contractures Contractures Info: Not present    Additional Factors Info  Code Status, Allergies Code Status Info: full Allergies Info: Macrobid (Nitrofurantoin Macrocrystal)           Current Medications (02/04/2022):  This is the current hospital active medication list Current Facility-Administered Medications  Medication Dose Route Frequency Provider Last Rate Last Admin   0.9 %  sodium chloride infusion   Intravenous Continuous Corinne Ports, PA-C 75 mL/hr at 02/03/22 1046 New Bag at 02/03/22 1046   acetaminophen (TYLENOL) tablet 650 mg  650 mg Oral Q6H PRN Corinne Ports, PA-C       Chlorhexidine Gluconate Cloth 2 % PADS 6 each  6 each Topical Daily Corinne Ports, PA-C   6 each at 02/04/22 2426   diphenhydrAMINE (BENADRYL) capsule 25 mg  25 mg Oral Q4H PRN Corinne Ports, PA-C   25 mg at 02/02/22 1857   docusate sodium (COLACE) capsule 100 mg  100 mg Oral BID Corinne Ports, PA-C   100 mg at 02/03/22 2241   enoxaparin (LOVENOX) injection 40 mg  40 mg Subcutaneous Q24H Rushie Nyhan A, PA-C   40 mg at 02/04/22 0806   escitalopram (  LEXAPRO) tablet 10 mg  10 mg Oral QHS Corinne Ports, PA-C   10 mg at 02/03/22 2241   HYDROmorphone (DILAUDID) injection 0.5 mg  0.5 mg Intravenous Q4H PRN Corinne Ports, PA-C   0.5 mg at 02/02/22 1857   Or   oxyCODONE-acetaminophen (PERCOCET/ROXICET) 5-325 MG per tablet 1 tablet  1 tablet Oral Q4H PRN Corinne Ports, PA-C   1 tablet at 02/04/22 4401   losartan (COZAAR) tablet 50 mg  50 mg Oral QHS Corinne Ports, PA-C   50 mg at 02/03/22 2241   melatonin tablet 5 mg  5 mg Oral QHS PRN Corinne Ports, PA-C        metoprolol tartrate (LOPRESSOR) injection 2.5 mg  2.5 mg Intravenous Q6H PRN Corinne Ports, PA-C       mupirocin ointment (BACTROBAN) 2 % 1 Application  1 Application Nasal BID Corinne Ports, PA-C   1 Application at 02/72/53 6644   Oral care mouth rinse  15 mL Mouth Rinse PRN Corinne Ports, PA-C       oxybutynin (DITROPAN) tablet 5 mg  5 mg Oral QHS Rushie Nyhan A, PA-C   5 mg at 02/03/22 2243   polyethylene glycol (MIRALAX / GLYCOLAX) packet 17 g  17 g Oral Daily PRN Rushie Nyhan A, PA-C       polyethylene glycol (MIRALAX / GLYCOLAX) packet 17 g  17 g Oral BID Charlynne Cousins, MD   17 g at 02/03/22 1048   prochlorperazine (COMPAZINE) injection 5 mg  5 mg Intravenous Q6H PRN Corinne Ports, PA-C   5 mg at 02/03/22 1057     Discharge Medications: Please see discharge summary for a list of discharge medications.  Relevant Imaging Results:  Relevant Lab Results:   Additional Information [id number]#[id number]. Pt is vaccinated for covid with multiple boosters.  Joanne Chars, LCSW

## 2022-02-04 NOTE — Anesthesia Postprocedure Evaluation (Signed)
Anesthesia Post Note  Patient: Mallory Murphy  Procedure(s) Performed: OPEN REDUCTION INTERNAL FIXATION OF RIGHT DISTAL PERI-PROSTHETIC FEMUR FRACTURE (Right: Leg Upper)     Patient location during evaluation: PACU Anesthesia Type: General Level of consciousness: awake and alert Pain management: pain level controlled Vital Signs Assessment: post-procedure vital signs reviewed and stable Respiratory status: spontaneous breathing, nonlabored ventilation, respiratory function stable and patient connected to nasal cannula oxygen Cardiovascular status: blood pressure returned to baseline and stable Postop Assessment: no apparent nausea or vomiting Anesthetic complications: no   No notable events documented.  Last Vitals:  Vitals:   02/04/22 0438 02/04/22 1331  BP: (!) 152/70 (!) 120/52  Pulse: 77 72  Resp: 16   Temp:  36.9 C  SpO2: 98% 94%    Last Pain:  Vitals:   02/04/22 1009  TempSrc:   PainSc: 0-No pain                 Cullin Dishman S

## 2022-02-04 NOTE — Evaluation (Signed)
Occupational Therapy Evaluation Patient Details Name: Mallory Murphy MRN: [id number] DOB: [birth year] Today's Date: 02/04/2022   History of Present Illness Pt is a 76 yr old female who had a syncope episode when assisting cat and found to have a right distal femur fracture. Pt s/p 02/03/22 ORIF of R distal femur and WBAT. PMH: anemia, HTN, anxiety/depression, ambulatory dysfunction, R toal hip sx, total shoulder sx, elbow arthoplasty   Clinical Impression   Pt at PLOF reported that they use a 4WW due to orthostatic hypotension but lives alone. Petr pt reorted they can have some assistance at dc from niece but not 24/7. Pt was able to complete supine to sit with moderate to max assist and sit to stand transfers with moderate assist. Pt was unable to clear BLE with use of RW to transfer to chair at this time. Pt requires max to total with LB ALDs and set up to min guard with UE ADLS. Pt currently with functional limitations due to the deficits listed below (see OT Problem List).  Pt will benefit from skilled OT to increase their safety and independence with ADL and functional mobility for ADL to facilitate discharge to venue listed below.    BP in supine: 161/76 BP in sitting: 167/120 BP in sitting past 3 mins: 170/60 BP post standing: 167/62      Recommendations for follow up therapy are one component of a multi-disciplinary discharge planning process, led by the attending physician.  Recommendations may be updated based on patient status, additional functional criteria and insurance authorization.   Follow Up Recommendations  Skilled nursing-short term rehab (<3 hours/day)    Assistance Recommended at Discharge Frequent or constant Supervision/Assistance  Patient can return home with the following A lot of help with walking and/or transfers;A lot of help with bathing/dressing/bathroom;Assist for transportation;Assistance with cooking/housework    Functional Status Assessment  Patient  has had a recent decline in their functional status and demonstrates the ability to make significant improvements in function in a reasonable and predictable amount of time.  Equipment Recommendations  None recommended by OT (TBD at next level of care)    Recommendations for Other Services       Precautions / Restrictions Precautions Precautions: Fall Precaution Comments: watch BP with orthostatics, per pt with RUE can not lift more then 5-8 pounds due to old sx Restrictions Weight Bearing Restrictions: No      Mobility Bed Mobility Overal bed mobility: Needs Assistance Bed Mobility: Rolling, Supine to Sit Rolling: Mod assist   Supine to sit: Max assist, HOB elevated     General bed mobility comments: slow to complete due to positional changes feeling dizziness    Transfers Overall transfer level: Needs assistance Equipment used: Rolling walker (2 wheels) Transfers: Sit to/from Stand Sit to Stand: Mod assist, From elevated surface           General transfer comment: needs increase in time      Balance Overall balance assessment: Needs assistance Sitting-balance support: Feet supported, Bilateral upper extremity supported, Single extremity supported Sitting balance-Leahy Scale: Fair   Postural control: Posterior lean Standing balance support: Bilateral upper extremity supported Standing balance-Leahy Scale: Poor Standing balance comment: requires Rw                           ADL either performed or assessed with clinical judgement   ADL Overall ADL's : Needs assistance/impaired Eating/Feeding: Set up;Sitting   Grooming: Wash/dry hands;Wash/dry face;Set  up;Sitting   Upper Body Bathing: Min guard;Sitting   Lower Body Bathing: Maximal assistance;Cueing for safety;Cueing for sequencing;Sit to/from stand;Total assistance   Upper Body Dressing : Min guard;Sitting   Lower Body Dressing: Maximal assistance;Total assistance;Sit to/from stand   Toilet  Transfer: Moderate assistance;Cueing for safety;Cueing for sequencing;Stand-pivot;Rolling walker (2 wheels)   Toileting- Clothing Manipulation and Hygiene: Total assistance;Sit to/from stand;Cueing for safety;Cueing for sequencing       Functional mobility during ADLs: Moderate assistance;Cueing for safety;Cueing for sequencing;Rolling walker (2 wheels)       Vision         Perception     Praxis      Pertinent Vitals/Pain Pain Assessment Pain Assessment: 0-10 Pain Score: 5  Pain Location: RLE Pain Descriptors / Indicators: Operative site guarding Pain Intervention(s): Limited activity within patient's tolerance, Monitored during session, Repositioned     Hand Dominance Left   Extremity/Trunk Assessment Upper Extremity Assessment Upper Extremity Assessment: RUE deficits/detail;LUE deficits/detail RUE Deficits / Details: Pt has had sx at R elbow limiting AROM and per pt they can not lift more than 5 pounds RUE Sensation: WNL RUE Coordination: decreased gross motor;decreased fine motor LUE Deficits / Details: Pt has had sxs and reported chronis limitations but able to manage ADLS at baseline LUE Coordination: decreased fine motor;decreased gross motor   Lower Extremity Assessment Lower Extremity Assessment: Defer to PT evaluation   Cervical / Trunk Assessment Cervical / Trunk Assessment: Kyphotic   Communication Communication Communication: No difficulties   Cognition Arousal/Alertness: Awake/alert Behavior During Therapy: WFL for tasks assessed/performed Overall Cognitive Status: Within Functional Limits for tasks assessed                                       General Comments       Exercises     Shoulder Instructions      Home Living Family/patient expects to be discharged to:: Private residence Living Arrangements: Alone Available Help at Discharge: Family Type of Home: House (Townhome) Home Access: Level entry     Home Layout: One  level     Bathroom Shower/Tub: Occupational psychologist: Standard     Home Equipment: Rollator (4 wheels);Shower seat          Prior Functioning/Environment Prior Level of Function : Independent/Modified Independent             Mobility Comments: Per pt they used a 4WW due to BP          OT Problem List: Decreased strength;Decreased range of motion;Decreased activity tolerance;Impaired balance (sitting and/or standing);Decreased knowledge of use of DME or AE;Cardiopulmonary status limiting activity;Pain      OT Treatment/Interventions: Self-care/ADL training;DME and/or AE instruction;Patient/family education;Balance training;Therapeutic activities    OT Goals(Current goals can be found in the care plan section) Acute Rehab OT Goals Patient Stated Goal: to move more OT Goal Formulation: With patient Time For Goal Achievement: 02/18/22 Potential to Achieve Goals: Good ADL Goals Pt Will Perform Upper Body Bathing: with modified independence;sitting Pt Will Perform Lower Body Bathing: with mod assist;sit to/from stand Pt Will Perform Upper Body Dressing: with modified independence;sitting Pt Will Perform Lower Body Dressing: with mod assist;sit to/from stand Pt Will Transfer to Toilet: with min guard assist;ambulating;regular height toilet  OT Frequency: Min 2X/week    Co-evaluation              AM-PAC OT "6  Clicks" Daily Activity     Outcome Measure Help from another person eating meals?: A Little Help from another person taking care of personal grooming?: A Little Help from another person toileting, which includes using toliet, bedpan, or urinal?: Total Help from another person bathing (including washing, rinsing, drying)?: A Lot Help from another person to put on and taking off regular upper body clothing?: A Little Help from another person to put on and taking off regular lower body clothing?: A Lot 6 Click Score: 14   End of Session Equipment  Utilized During Treatment: Gait belt;Rolling walker (2 wheels) Nurse Communication: Mobility status;Patient requests pain meds  Activity Tolerance: No increased pain Patient left: in chair;with call bell/phone within reach  OT Visit Diagnosis: Unsteadiness on feet (R26.81);Other abnormalities of gait and mobility (R26.89);Repeated falls (R29.6);Muscle weakness (generalized) (M62.81);Pain Pain - Right/Left: Right                Time: 9509-3267 OT Time Calculation (min): 40 min Charges:  OT General Charges $OT Visit: 1 Visit OT Evaluation $OT Eval Moderate Complexity: 1 Mod OT Treatments $Self Care/Home Management : 23-37 mins  Joeseph Amor OTR/L  Acute Rehab Services  [phone] office number [phone] pager number   Joeseph Amor 02/04/2022, 10:09 AM

## 2022-02-04 NOTE — Evaluation (Signed)
Physical Therapy Evaluation Patient Details Name: Mallory Murphy MRN: [id number] DOB: 08/19/1945 Today's Date: 02/04/2022  History of Present Illness  Pt is a 76 yr old female who had a syncope episode when assisting cat and found to have a right distal femur fracture. Pt s/p 02/03/22 ORIF. PMH: anemia, HTN, anxiety/depression, ambulatory dysfunction, R and L total hip sx, total shoulder sx, R elbow arthoplasty  Clinical Impression  Pt admitted with above diagnosis. Pt received in recliner. She reports she did not have her rollator with her when she passed out in kitchen, her niece had it because they were getting ready to go get covid shots. Pt reports these episodes happen frequently. Pt lives alone with niece assisting with transportation. Pt's current mobility limited by new femur fx but also past h/o UE surgeries making it difficult to bear wt on RW. Pt ambulated 3' with RW and mod A and could go no further. Will need to use w/c for household and community mobility in the short term. Recommend SNF for rehab and pt agreeable.   Pt currently with functional limitations due to the deficits listed below (see PT Problem List). Pt will benefit from skilled PT to increase their independence and safety with mobility to allow discharge to the venue listed below.          Recommendations for follow up therapy are one component of a multi-disciplinary discharge planning process, led by the attending physician.  Recommendations may be updated based on patient status, additional functional criteria and insurance authorization.  Follow Up Recommendations Skilled nursing-short term rehab (<3 hours/day) Can patient physically be transported by private vehicle: Yes    Assistance Recommended at Discharge Frequent or constant Supervision/Assistance  Patient can return home with the following  Two people to help with walking and/or transfers;A lot of help with bathing/dressing/bathroom;Assistance with  cooking/housework;Assist for transportation    Equipment Recommendations None recommended by PT  Recommendations for Other Services       Functional Status Assessment Patient has had a recent decline in their functional status and demonstrates the ability to make significant improvements in function in a reasonable and predictable amount of time.     Precautions / Restrictions Precautions Precautions: Fall Precaution Comments: watch BP with orthostatics, per pt with RUE can not lift more then 5 pounds due to old sx Restrictions Weight Bearing Restrictions: Yes RLE Weight Bearing: Weight bearing as tolerated      Mobility  Bed Mobility               General bed mobility comments: received in chair and not yet ready to go back to bed    Transfers Overall transfer level: Needs assistance Equipment used: Rolling walker (2 wheels) Transfers: Sit to/from Stand Sit to Stand: Mod assist           General transfer comment: mod A for power up. Has difficulty using UE's to push up.    Ambulation/Gait Ambulation/Gait assistance: Mod assist Gait Distance (Feet): 3 Feet Assistive device: Rolling walker (2 wheels) Gait Pattern/deviations: Step-to pattern, Antalgic Gait velocity: decreased Gait velocity interpretation: <1.31 ft/sec, indicative of household ambulator   General Gait Details: pt able to advance RLE however, had great difficulty wt shifting to R to advance LLE. Unable to go any further than 3' due to fatigue in LE's and UE's and pain  Stairs            Wheelchair Mobility    Modified Rankin (Stroke Patients Only)  Balance Overall balance assessment: Needs assistance Sitting-balance support: Feet supported, Bilateral upper extremity supported, Single extremity supported Sitting balance-Leahy Scale: Fair   Postural control: Posterior lean Standing balance support: Bilateral upper extremity supported Standing balance-Leahy Scale:  Poor Standing balance comment: requires RW and external support                             Pertinent Vitals/Pain Pain Assessment Pain Assessment: Faces Faces Pain Scale: Hurts little more Pain Location: RLE Pain Descriptors / Indicators: Operative site guarding, Sore Pain Intervention(s): Monitored during session, Limited activity within patient's tolerance, Premedicated before session    Home Living Family/patient expects to be discharged to:: Private residence Living Arrangements: Alone Available Help at Discharge: Family Type of Home: House (Townhome) Home Access: Level entry       Home Layout: One level Home Equipment: Rollator (4 wheels);Shower seat Additional Comments: niece can check on her    Prior Function Prior Level of Function : Independent/Modified Independent             Mobility Comments: uses rollator for mobility out of home but often no AD in home. Was not using when fell       Hand Dominance   Dominant Hand: Left    Extremity/Trunk Assessment   Upper Extremity Assessment Upper Extremity Assessment: Defer to OT evaluation    Lower Extremity Assessment Lower Extremity Assessment: Generalized weakness;RLE deficits/detail RLE Deficits / Details: hip flex 2-/5, knee ext 2-/5 RLE Sensation: WNL RLE Coordination: decreased gross motor    Cervical / Trunk Assessment Cervical / Trunk Assessment: Kyphotic  Communication   Communication: No difficulties  Cognition Arousal/Alertness: Awake/alert Behavior During Therapy: WFL for tasks assessed/performed Overall Cognitive Status: Within Functional Limits for tasks assessed                                          General Comments General comments (skin integrity, edema, etc.): SPO2 86% on RA, HR 70 bpm    Exercises General Exercises - Lower Extremity Ankle Circles/Pumps: AROM, Both, 10 reps, Seated Quad Sets: AROM, Both, 10 reps, Seated Long Arc Quad: AAROM,  Right, 5 reps, Seated Straight Leg Raises: AAROM, Right, 5 reps, Seated   Assessment/Plan    PT Assessment Patient needs continued PT services  PT Problem List Decreased strength;Decreased range of motion;Decreased activity tolerance;Decreased balance;Decreased mobility;Pain;Decreased knowledge of precautions       PT Treatment Interventions DME instruction;Gait training;Functional mobility training;Therapeutic activities;Therapeutic exercise;Balance training;Patient/family education    PT Goals (Current goals can be found in the Care Plan section)  Acute Rehab PT Goals Patient Stated Goal: return home after rehab PT Goal Formulation: With patient Time For Goal Achievement: 02/18/22 Potential to Achieve Goals: Good    Frequency Min 3X/week     Co-evaluation               AM-PAC PT "6 Clicks" Mobility  Outcome Measure Help needed turning from your back to your side while in a flat bed without using bedrails?: A Lot Help needed moving from lying on your back to sitting on the side of a flat bed without using bedrails?: A Lot Help needed moving to and from a bed to a chair (including a wheelchair)?: A Lot Help needed standing up from a chair using your arms (e.g., wheelchair or bedside chair)?: A Lot  Help needed to walk in hospital room?: Total Help needed climbing 3-5 steps with a railing? : Total 6 Click Score: 10    End of Session Equipment Utilized During Treatment: Gait belt Activity Tolerance: Patient tolerated treatment well Patient left: in chair;with call bell/phone within reach Nurse Communication: Mobility status PT Visit Diagnosis: Unsteadiness on feet (R26.81);Muscle weakness (generalized) (M62.81);Difficulty in walking, not elsewhere classified (R26.2);Pain Pain - Right/Left: Right Pain - part of body: Hip    Time: 3817-7116 PT Time Calculation (min) (ACUTE ONLY): 23 min   Charges:   PT Evaluation $PT Eval Moderate Complexity: 1 Mod PT  Treatments $Therapeutic Exercise: 8-22 mins        Leighton Roach, PT  Acute Rehab Services Secure chat preferred Office [place] 02/04/2022, 2:12 PM

## 2022-02-05 DIAGNOSIS — M9711XA Periprosthetic fracture around internal prosthetic right knee joint, initial encounter: Secondary | ICD-10-CM

## 2022-02-05 LAB — CBC
HCT: 22.6 % — ABNORMAL LOW (ref 36.0–46.0)
Hemoglobin: 7.7 g/dL — ABNORMAL LOW (ref 12.0–15.0)
MCH: 30.7 pg (ref 26.0–34.0)
MCHC: 34.1 g/dL (ref 30.0–36.0)
MCV: 90 fL (ref 80.0–100.0)
Platelets: 207 10*3/uL (ref 150–400)
RBC: 2.51 MIL/uL — ABNORMAL LOW (ref 3.87–5.11)
RDW: 13.2 % (ref 11.5–15.5)
WBC: 14.1 10*3/uL — ABNORMAL HIGH (ref 4.0–10.5)
nRBC: 0 % (ref 0.0–0.2)

## 2022-02-05 MED ORDER — HYDROCHLOROTHIAZIDE 12.5 MG PO TABS
12.5000 mg | ORAL_TABLET | Freq: Every day | ORAL | Status: DC
  Administered 2022-02-05 – 2022-02-06 (×2): 12.5 mg via ORAL
  Filled 2022-02-05 (×2): qty 1

## 2022-02-05 MED ORDER — APIXABAN 2.5 MG PO TABS: 2.5000 mg | ORAL_TABLET | Freq: Two times a day (BID) | ORAL | 0 refills | Status: DC

## 2022-02-05 MED ORDER — SORBITOL 70 % SOLN
960.0000 mL | TOPICAL_OIL | Freq: Once | ORAL | Status: AC
  Administered 2022-02-05: 960 mL via RECTAL
  Filled 2022-02-05: qty 240

## 2022-02-05 MED ORDER — OXYCODONE-ACETAMINOPHEN 5-325 MG PO TABS: 1.0000 | ORAL_TABLET | ORAL | 0 refills | Status: DC | PRN

## 2022-02-05 NOTE — TOC Progression Note (Addendum)
Transition of Care [facility]) - Progression Note    Patient Details  Name: Mallory Murphy MRN: [id number] Date of Birth: 12/28/1945  [facility] of [facility]) CM/SW Contact  Mallory Chars, LCSW Phone Number: 02/05/2022, 12:41 PM  Clinical Narrative:    CSW spoke with pt regarding simponi injection--she gets this at rheumatologist office, Dr Trudie Reed, not due for about a month.   CSW called [name], [place] is on vacation, LM for person covering admissions regarding simponi injection.   1215: CSW spoke with pt niece Mallory Murphy (mimi) in room.  Pt has appt for next simponi injection on 11/16.  If pt is still at SNF, neice can take her for the infusion. If can't get [name], would like [facility].   CSW called back to [name], left another message with admissions.   1500: CSW spoke with pt niece, Mallory Murphy.  She is ready to move on from [name] and see if [facility] can take pt.  CSW confirmed with [facility] that they can accept pt.  No beds today, can take tomorrow to private room.  Discussed pt simponi injection--after checking with her admin, pt could not attend the appt at the MD office for the infusion, because, even if done at the outpt office, the charges would still go back to [facility].  Discussed with [name], she said the appt can bed delayed until rehab is completed.  [facility] OK with this plan.  DC plan for tomorrow.    1600: CSW spoke with pt in her room, updated her on the above.  She is in agreement with the plan.   Expected Discharge Plan: [place] Barriers to Discharge: Continued Medical Work up, SNF Pending bed offer  Expected Discharge Plan and Services Expected Discharge Plan: [place] In-house Referral: Clinical Social Work   Post Acute Care Choice: [place] Living arrangements for the past 2 months: Single Family Home Expected Discharge Date: 02/05/22                                      Social Determinants of Health (SDOH) Interventions    Readmission Risk Interventions     No data to display

## 2022-02-05 NOTE — Progress Notes (Signed)
Mobility Specialist Progress Note   02/05/22 1637  Mobility  Activity Stood at bedside (x3)  Level of Assistance Moderate assist, patient does 50-74%  Assistive Device Front wheel walker  RUE Weight Bearing WBAT  RLE Weight Bearing WBAT  Activity Response Tolerated well  $Mobility charge 1 Mobility   Found pt in bed feeling sore from recent enema but agreeable to mobility. Pt able to get EOB w/ minA + min cues on positioning and hand placement. X3 STS w/ modA on rise from an inclined surface but pt able to stand for ~30secs. each attempt. Returned back supine w/o fault, call bell placed in reach.  Holland Falling Mobility Specialist Acute Rehab Office:  [phone]

## 2022-02-05 NOTE — Discharge Summary (Signed)
Physician Discharge Summary  Mallory Murphy SWH:[id number] DOB: 05/20/1945 DOA: 02/01/2022  PCP: Seward Carol, MD  Admit date: 02/01/2022 Discharge date: 02/05/2022  Admitted From: Home Disposition:  SNF  Recommendations for Outpatient Follow-up:  Follow up with PCP in 1-2 weeks Please obtain BMP/CBC in one week   Home Health:no Equipment/Devices:None  Discharge Condition:Stable CODE STATUS:Full Diet recommendation: Heart Healthy  Brief/Interim Summary: 76 y.o. female past medical history significant for iron deficiency anemia, rheumatoid arthritis comes in after experiencing a syncopal episode work-up revealed a right distal femur fracture, orthopedic surgery was consulted who recommended or are I have on 02/03/2022  Discharge Diagnoses:  Principal Problem:   Femur fracture, right ([place]) Active Problems:   Orthostatic syncope   Chronic kidney disease, stage 3a ([place])   Normocytic anemia   Leukocytosis  Right femur fracture: Orthopedic surgery was consulted to perform internal fixation of the right distal femur. She was started on narcotic and bowel regimen which controlled her pain. Physical therapy evaluated the patient, she will need to go to rehab temporarily.  Orthostatic hypotension: Likely the cause of her syncope, this resolved with fluid resuscitation. We will have to monitor her blood pressure as an outpatient if her orthostasis remain negative can start her on a diuretic for better control of her blood pressure.  Essential hypertension: No change made to her medication, she will need orthostatic vitals check as an outpatient if they remain negative can titrate antihypertensive medications as needed.  Normocytic anemia: Her hemoglobin dropped to 7.7 no signs of overt bleeding likely due to surgical intervention we will continue Aranesp injection as an outpatient check a CBC in 1 week at the skilled nursing facility.  Chronic kidney disease stage  IIIa: Appears to be at baseline.  Leukocytosis: Remained afebrile likely reactive from trauma event there is resolved.  Discharge Instructions  Discharge Instructions     Diet - low sodium heart healthy   Complete by: As directed    Increase activity slowly   Complete by: As directed    No wound care   Complete by: As directed       Allergies as of 02/05/2022       Reactions   Macrobid [nitrofurantoin Macrocrystal] Nausea Only        Medication List     TAKE these medications    ARANESP IJ Inject 300 mcg into the skin See admin instructions. 300 mcg into the skin every 3 months for Hgb <11   diphenhydrAMINE 25 MG tablet Commonly known as: BENADRYL Take 25 mg by mouth at bedtime.   escitalopram 10 MG tablet Commonly known as: LEXAPRO Take 10 mg by mouth at bedtime.   leflunomide 20 MG tablet Commonly known as: ARAVA Take 20 mg by mouth at bedtime.   losartan 50 MG tablet Commonly known as: COZAAR Take 50 mg by mouth at bedtime.   oxybutynin 5 MG tablet Commonly known as: DITROPAN Take 5 mg by mouth at bedtime.   oxyCODONE-acetaminophen 5-325 MG tablet Commonly known as: PERCOCET/ROXICET Take 1 tablet by mouth every 4 (four) hours as needed for moderate pain.   Simponi Aria 50 MG/4ML Soln injection Generic drug: golimumab Inject into the vein every 8 (eight) weeks.        Follow-up Information     Haddix, Thomasene Lot, MD. Schedule an appointment as soon as possible for a visit in 2 week(s).   Specialty: Orthopedic Surgery Why: for wound check and repeat x-rays Contact information: [place]  [place] [address] [phone]                Allergies  Allergen Reactions   Macrobid [Nitrofurantoin Macrocrystal] Nausea Only    Consultations: Orthopedic surgery   Procedures/Studies: DG FEMUR PORT, MIN 2 VIEWS RIGHT  Result Date: 02/03/2022 CLINICAL DATA:  Femur fracture. EXAM: RIGHT FEMUR PORTABLE 2 VIEW COMPARISON:   Intraoperative fluoroscopy ORIF of right femur 02/03/2022, pelvis and right hip radiographs 02/01/2022, right knee radiographs 02/01/2022 (multiple studies) FINDINGS: Redemonstration of total right hip arthroplasty. 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 New long lateral plate and screw fixation of the distal fibula, traversing the known comminuted and displaced/angulated distal femoral fracture just proximal to total right knee arthroplasty hardware. There is improved alignment with persistent mild posterior approximately 5 mm displacement of the distal fracture component with respect to the proximal fracture component. There is diffuse decreased bone mineralization. IMPRESSION: New lateral plate and screw fixation of the distal fibula, traversing the known comminuted and displaced/angulated distal femoral fracture. There is improved alignment. Electronically Signed   By: Yvonne Kendall M.D.   On: 02/03/2022 10:17   DG FEMUR, MIN 2 VIEWS RIGHT  Result Date: 02/03/2022 CLINICAL DATA:  [id number] Elective surgery [id number] EXAM: RIGHT FEMUR 2 VIEWS COMPARISON:  None Available. FINDINGS: Intraoperative fluoroscopic images during lateral plate fixation of the distal femur. Partially visualized right hip arthroplasty. Prior total knee arthroplasty. Improved fracture alignment. No immediate complication. IMPRESSION: Intraoperative fluoroscopic images during plate fixation of the distal femur. Improved fracture alignment. No evidence of immediate complication. Electronically Signed   By: Maurine Simmering M.D.   On: 02/03/2022 09:00   DG C-Arm 1-60 Min-No Report  Result Date: 02/03/2022 Fluoroscopy was utilized by the requesting physician.  No radiographic interpretation.   DG C-Arm 1-60 Min-No Report  Result Date: 02/03/2022 Fluoroscopy was utilized by the requesting physician.  No radiographic  interpretation.   ECHOCARDIOGRAM COMPLETE  Result Date: 02/02/2022    ECHOCARDIOGRAM REPORT   Patient Name:   Mallory Murphy Date of Exam: 02/02/2022 Medical Rec #:  [id number]          Height:       61.0 in Accession #:    [id number]         Weight:       136.0 lb Date of Birth:  1945/07/27           BSA:          1.603 m Patient Age:    76 years           BP:           131/60 mmHg Patient Gender: F                  HR:           96 bpm. Exam Location:  Inpatient Procedure: 2D Echo Indications:    Syncope  History:        Patient has prior history of Echocardiogram examinations, most                 recent 06/22/2014. Signs/Symptoms:Syncope; Risk                 Factors:Hypertension.  Sonographer:    Harvie Junior Referring Phys: [id number] Vernelle Emerald  Sonographer Comments: Technically difficult study due to poor echo windows. Sypine. IMPRESSIONS  1. Left ventricular  ejection fraction, by estimation, is 60 to 65%. The left ventricle has normal function. The left ventricle has no regional wall motion abnormalities. There is moderate asymmetric left ventricular hypertrophy of the basal-septal segment. Left ventricular diastolic parameters are consistent with Grade I diastolic dysfunction (impaired relaxation).  2. Right ventricular systolic function is normal. The right ventricular size is normal. There is normal pulmonary artery systolic pressure.  3. The mitral valve is normal in structure. No evidence of mitral valve regurgitation. No evidence of mitral stenosis.  4. The aortic valve is tricuspid. Aortic valve regurgitation is mild to moderate. No aortic stenosis is present.  5. The inferior vena cava is normal in size with greater than 50% respiratory variability, suggesting right atrial pressure of 3 mmHg. FINDINGS  Left Ventricle: Left ventricular ejection fraction, by estimation, is 60 to 65%. The left ventricle has normal function. The left ventricle has no regional wall motion abnormalities. The  left ventricular internal cavity size was normal in size. There is  moderate asymmetric left ventricular hypertrophy of the basal-septal segment. Left ventricular diastolic parameters are consistent with Grade I diastolic dysfunction (impaired relaxation). Normal left ventricular filling pressure. Right Ventricle: The right ventricular size is normal. Right vetricular wall thickness was not well visualized. Right ventricular systolic function is normal. There is normal pulmonary artery systolic pressure. The tricuspid regurgitant velocity is 2.65 m/s, and with an assumed right atrial pressure of 3 mmHg, the estimated right ventricular systolic pressure is 02.5 mmHg. Left Atrium: Left atrial size was normal in size. Right Atrium: Right atrial size was normal in size. Pericardium: There is no evidence of pericardial effusion. Mitral Valve: The mitral valve is normal in structure. No evidence of mitral valve regurgitation. No evidence of mitral valve stenosis. Tricuspid Valve: The tricuspid valve is not well visualized. Tricuspid valve regurgitation is mild . No evidence of tricuspid stenosis. Aortic Valve: The aortic valve is tricuspid. Aortic valve regurgitation is mild to moderate. Aortic regurgitation PHT measures 259 msec. No aortic stenosis is present. Aortic valve mean gradient measures 3.0 mmHg. Aortic valve peak gradient measures 6.0 mmHg. Aortic valve area, by VTI measures 3.16 cm. Pulmonic Valve: The pulmonic valve was not well visualized. Pulmonic valve regurgitation is not visualized. No evidence of pulmonic stenosis. Aorta: The aortic root and ascending aorta are structurally normal, with no evidence of dilitation. Venous: The inferior vena cava is normal in size with greater than 50% respiratory variability, suggesting right atrial pressure of 3 mmHg. IAS/Shunts: No atrial level shunt detected by color flow Doppler.  LEFT VENTRICLE PLAX 2D LVIDd:         3.50 cm     Diastology LVIDs:         3.00 cm      LV e' medial:    6.74 cm/s LV PW:         1.00 cm     LV E/e' medial:  8.5 LV IVS:        1.00 cm     LV e' lateral:   8.05 cm/s LVOT diam:     2.00 cm     LV E/e' lateral: 7.1 LV SV:         65 LV SV Index:   40 LVOT Area:     3.14 cm  LV Volumes (MOD) LV vol d, MOD A2C: 66.1 ml LV vol d, MOD A4C: 61.0 ml LV vol s, MOD A2C: 32.7 ml LV vol s, MOD A4C: 30.2 ml LV SV MOD  A2C:     33.4 ml LV SV MOD A4C:     61.0 ml LV SV MOD BP:      32.3 ml RIGHT VENTRICLE RV Basal diam:  2.70 cm RV Mid diam:    2.30 cm RV S prime:     14.00 cm/s TAPSE (M-mode): 1.6 cm LEFT ATRIUM             Index        RIGHT ATRIUM          Index LA diam:        2.90 cm 1.81 cm/m   RA Area:     8.75 cm LA Vol (A2C):   27.8 ml 17.34 ml/m  RA Volume:   14.30 ml 8.92 ml/m LA Vol (A4C):   31.3 ml 19.52 ml/m LA Biplane Vol: 29.1 ml 18.15 ml/m  AORTIC VALVE                    PULMONIC VALVE AV Area (Vmax):    3.01 cm     PV Vmax:          0.99 m/s AV Area (Vmean):   3.19 cm     PV Peak grad:     3.9 mmHg AV Area (VTI):     3.16 cm     PR End Diast Vel: 2.65 msec AV Vmax:           122.00 cm/s AV Vmean:          82.800 cm/s AV VTI:            0.205 m AV Peak Grad:      6.0 mmHg AV Mean Grad:      3.0 mmHg LVOT Vmax:         117.00 cm/s LVOT Vmean:        84.000 cm/s LVOT VTI:          0.206 m LVOT/AV VTI ratio: 1.00 AI PHT:            259 msec  AORTA Ao Root diam: 3.50 cm Ao Asc diam:  3.50 cm MITRAL VALVE               TRICUSPID VALVE MV Area (PHT): 4.71 cm    TR Peak grad:   28.1 mmHg MV Decel Time: 161 msec    TR Vmax:        265.00 cm/s MV E velocity: 57.40 cm/s MV A velocity: 94.30 cm/s  SHUNTS MV E/A ratio:  0.61        Systemic VTI:  0.21 m                            Systemic Diam: 2.00 cm Carlyle Dolly MD Electronically signed by Carlyle Dolly MD Signature Date/Time: 02/02/2022/12:12:33 PM    Final    DG Chest 1 View  Result Date: 02/02/2022 CLINICAL DATA:  Syncopal episode with suspicion for pneumonia EXAM: CHEST  1 VIEW  COMPARISON:  Chest radiograph dated 06/20/2016 FINDINGS: Slightly low lung volumes. Hazy opacity silhouetting the right heart border. The heart size and mediastinal contours are within normal limits. Aortic atherosclerosis. Partially imaged left shoulder arthroplasty and right humeral intramedullary nail. Slightly increased lucency surrounding the proximal aspect of the intramedullary nail. Severe degenerative changes of the right glenohumeral joint. IMPRESSION: 1. Hazy opacity silhouetting the right heart border, which may represent atelectasis or pneumonia. 2. Slightly increased lucency surrounding  the proximal aspect of the right humeral intramedullary nail, which could represent loosening. Electronically Signed   By: Darrin Nipper M.D.   On: 02/02/2022 08:49   DG Knee 2 Views Right  Result Date: 02/01/2022 CLINICAL DATA:  Post reduction. EXAM: RIGHT KNEE - 1-2 VIEW COMPARISON:  Earlier radiograph dated 02/01/2022. FINDINGS: Displaced and angulated fracture of the distal right femoral diaphysis above the ostia cc. The hardware is intact. The bones are osteopenic. There has been interval placement overlying immobilizer. IMPRESSION: Displaced and angulated fracture of the distal right femoral diaphysis. Electronically Signed   By: Anner Crete M.D.   On: 02/01/2022 20:41   DG Hip Unilat W or Wo Pelvis 2-3 Views Right  Result Date: 02/01/2022 CLINICAL DATA:  Fall. EXAM: DG HIP (WITH OR WITHOUT PELVIS) 2-3V RIGHT COMPARISON:  None Available. FINDINGS: Bilateral total hip arthroplasties. The arthroplasty components appear intact and in anatomic alignment. There is no acute fracture or dislocation. The bones are osteopenic. Degenerative changes of the lower lumbar spine. The soft tissues are unremarkable. IMPRESSION: 1. No acute fracture or dislocation. 2. Bilateral total hip arthroplasties. Electronically Signed   By: Anner Crete M.D.   On: 02/01/2022 18:13   DG Knee 2 Views Right  Result Date:  02/01/2022 CLINICAL DATA:  Right knee pain after fall. EXAM: RIGHT KNEE - 1-2 VIEW COMPARISON:  None Available. FINDINGS: Status post right total knee arthroplasty. Severely angulated fracture is seen involving the distal right femoral shaft just above the prosthesis. IMPRESSION: Severely angulated fracture is seen involving the distal right femoral shaft just above prosthesis. Electronically Signed   By: Marijo Conception M.D.   On: 02/01/2022 16:20   (Echo, Carotid, EGD, Colonoscopy, ERCP)    Subjective: No new complaints  Discharge Exam: Vitals:   02/04/22 2041 02/05/22 0811  BP: (!) 146/61 (!) 164/56  Pulse: 77 88  Resp: 16 18  Temp: 98.5 F (36.9 C) 99 F (37.2 C)  SpO2: 97% 95%   Vitals:   02/04/22 0438 02/04/22 1331 02/04/22 2041 02/05/22 0811  BP: (!) 152/70 (!) 120/52 (!) 146/61 (!) 164/56  Pulse: 77 72 77 88  Resp: '16  16 18  '$ Temp:  98.5 F (36.9 C) 98.5 F (36.9 C) 99 F (37.2 C)  TempSrc:   Oral Oral  SpO2: 98% 94% 97% 95%  Weight:      Height:        General: Pt is alert, awake, not in acute distress Cardiovascular: RRR, S1/S2 +, no rubs, no gallops Respiratory: CTA bilaterally, no wheezing, no rhonchi Abdominal: Soft, NT, ND, bowel sounds + Extremities: no edema, no cyanosis    The results of significant diagnostics from this hospitalization (including imaging, microbiology, ancillary and laboratory) are listed below for reference.     Microbiology: Recent Results (from the past 240 hour(s))  Surgical PCR screen     Status: Abnormal   Collection Time: 02/02/22  2:14 AM   Specimen: Nasal Mucosa; Nasal Swab  Result Value Ref Range Status   MRSA, PCR NEGATIVE NEGATIVE Final   Staphylococcus aureus POSITIVE (A) NEGATIVE Final    Comment: RESULT CALLED TO, READ BACK BY AND VERIFIED WITH: ESTEBAN, V @ 0510 [id number] (NOTE) The Xpert SA Assay (FDA approved for NASAL specimens in patients 60 years of age and older), is one component of a  comprehensive surveillance program. It is not intended to diagnose infection nor to guide or monitor treatment. Performed at [facility], [place]  [address], [place], [place] [address]      Labs: BNP (last 3 results) No results for input(s): "BNP" in the last 8760 hours. Basic Metabolic Panel: Recent Labs  Lab 02/01/22 1756 02/02/22 0336 02/03/22 0258 02/04/22 0342  NA 135 137 136 134*  K 4.2 4.0 3.8 3.8  CL 106 106 106 106  CO2 '22 23 22 23  '$ GLUCOSE 109* 126* 109* 146*  BUN 22 24* 23 14  CREATININE 0.97 1.09* 1.18* 0.94  CALCIUM 8.2* 8.2* 8.1* 8.6*  MG  --  1.8 1.7  --   PHOS  --  3.9  --   --    Liver Function Tests: Recent Labs  Lab 02/01/22 1756 02/02/22 0336 02/03/22 0258  AST '27 23 24  '$ ALT '12 13 13  '$ ALKPHOS 73 71 60  BILITOT 0.9 0.7 0.4  PROT 7.2 6.6 5.9*  ALBUMIN 3.4* 3.2* 2.7*   No results for input(s): "LIPASE", "AMYLASE" in the last 168 hours. No results for input(s): "AMMONIA" in the last 168 hours. CBC: Recent Labs  Lab 02/01/22 1641 02/02/22 0336 02/03/22 0258 02/04/22 0342 02/05/22 0618  WBC 17.0* 13.0* 10.7* 18.7* 14.1*  NEUTROABS 13.9* 8.9* 6.4  --   --   HGB 12.0 10.0* 8.5* 8.0* 7.7*  HCT 36.4 30.4* 25.8* 24.3* 22.6*  MCV 90.5 91.3 92.5 91.0 90.0  PLT 269 223 188 178 207   Cardiac Enzymes: No results for input(s): "CKTOTAL", "CKMB", "CKMBINDEX", "TROPONINI" in the last 168 hours. BNP: Invalid input(s): "POCBNP" CBG: No results for input(s): "GLUCAP" in the last 168 hours. D-Dimer No results for input(s): "DDIMER" in the last 72 hours. Hgb A1c No results for input(s): "HGBA1C" in the last 72 hours. Lipid Profile No results for input(s): "CHOL", "HDL", "LDLCALC", "TRIG", "CHOLHDL", "LDLDIRECT" in the last 72 hours. Thyroid function studies No results for input(s): "TSH", "T4TOTAL", "T3FREE", "THYROIDAB" in the last 72 hours.  Invalid input(s): "FREET3" Anemia work up No results for input(s): "VITAMINB12",  "FOLATE", "FERRITIN", "TIBC", "IRON", "RETICCTPCT" in the last 72 hours. Urinalysis    Component Value Date/Time   COLORURINE YELLOW 02/01/2022 2120   APPEARANCEUR CLEAR 02/01/2022 2120   LABSPEC 1.010 02/01/2022 2120   PHURINE 7.5 02/01/2022 2120   GLUCOSEU NEGATIVE 02/01/2022 2120   HGBUR NEGATIVE 02/01/2022 2120   BILIRUBINUR NEGATIVE 02/01/2022 2120   KETONESUR NEGATIVE 02/01/2022 2120   PROTEINUR NEGATIVE 02/01/2022 2120   UROBILINOGEN 1.0 05/17/2013 1129   NITRITE NEGATIVE 02/01/2022 2120   LEUKOCYTESUR NEGATIVE 02/01/2022 2120   Sepsis Labs Recent Labs  Lab 02/02/22 0336 02/03/22 0258 02/04/22 0342 02/05/22 0618  WBC 13.0* 10.7* 18.7* 14.1*   Microbiology Recent Results (from the past 240 hour(s))  Surgical PCR screen     Status: Abnormal   Collection Time: 02/02/22  2:14 AM   Specimen: Nasal Mucosa; Nasal Swab  Result Value Ref Range Status   MRSA, PCR NEGATIVE NEGATIVE Final   Staphylococcus aureus POSITIVE (A) NEGATIVE Final    Comment: RESULT CALLED TO, READ BACK BY AND VERIFIED WITH: ESTEBAN, V @ 0510 [id number] (NOTE) The Xpert SA Assay (FDA approved for NASAL specimens in patients 3 years of age and older), is one component of a comprehensive surveillance program. It is not intended to diagnose infection nor to guide or monitor treatment. Performed at [facility], [place] [address], [place], [place] [address]     SIGNED:   Charlynne Cousins, MD  [facility] 02/05/2022, 10:20 AM Pager   If 7PM-7AM,  please contact night-coverage [email or web address] Password [email or web address]

## 2022-02-05 NOTE — Progress Notes (Signed)
Physical Therapy Treatment Patient Details Name: Mallory Murphy MRN: [id number] DOB: 09/26/1945 Today's Date: 02/05/2022   History of Present Illness Pt is a 76 yr old female who had a syncope episode when assisting cat and found to have a right distal femur fracture. Pt s/p 02/03/22 ORIF. PMH: anemia, HTN, anxiety/depression, ambulatory dysfunction, R and L total hip sx, total shoulder sx, R elbow arthoplasty    PT Comments    Pt seen for PT tx with pt agreeable to session. Pt is motivated to participate but is limited by pain with movement & overall fatigue. Pt requires mod/max assist for supine>sit even with HOB elevated & use of bed rails, mod<>max assist for STS. Pt is able to complete bed>recliner with RW & mod assist with improving ability to weight shift L<>R. Attempted gait with pt able to take 2 very small steps forwards & backwards. Pt demonstrates step to gait pattern, decreased weight shift to RLE, decreased step length, decreased stride length, and limited by decreased ability to weight bear through BUE (specifically RUE) to offload RLE. Pt would benefit from ongoing PT services to address strengthening, balance, activity tolerance, bed mobility, transfers, & gait with LRAD.   Recommendations for follow up therapy are one component of a multi-disciplinary discharge planning process, led by the attending physician.  Recommendations may be updated based on patient status, additional functional criteria and insurance authorization.  Follow Up Recommendations  Skilled nursing-short term rehab (<3 hours/day) Can patient physically be transported by private vehicle: Yes   Assistance Recommended at Discharge Frequent or constant Supervision/Assistance  Patient can return home with the following Two people to help with walking and/or transfers;A lot of help with bathing/dressing/bathroom;Assistance with cooking/housework;Assist for transportation   Equipment Recommendations  None  recommended by PT    Recommendations for Other Services       Precautions / Restrictions Precautions Precautions: Fall Precaution Comments: watch BP with orthostatics, per pt with RUE can not lift more then 5 pounds due to old sx Restrictions Weight Bearing Restrictions: Yes RUE Weight Bearing: Weight bearing as tolerated RLE Weight Bearing: Weight bearing as tolerated     Mobility  Bed Mobility Overal bed mobility: Needs Assistance Bed Mobility: Supine to Sit     Supine to sit: Mod assist, Max assist, HOB elevated     General bed mobility comments: Pt attempts to transition supine>sit without assistance with HOB elevated & bed rails but after ~8 minutes pt finally requests assistance & requires mod/max assist to upright trunk & full scoot to sitting EOB.    Transfers Overall transfer level: Needs assistance Equipment used: Rolling walker (2 wheels) Transfers: Sit to/from Stand, Bed to chair/wheelchair/BSC Sit to Stand: Max assist (cuing for hand placement, assistance to power up to standing)   Step pivot transfers: Mod assist       General transfer comment: Pt transfers bed>recliner on R with RW & mod assist with extra time for weight shifting L<>R, advancing R foot, & turning RW. PT provides educational cuing to square up to surface, reach back with BUE, and eccentric control with stand>sit with good return demo from pt.    Ambulation/Gait                   Stairs             Wheelchair Mobility    Modified Rankin (Stroke Patients Only)       Balance Overall balance assessment: Needs assistance Sitting-balance support: Feet supported, Bilateral upper  extremity supported, Single extremity supported Sitting balance-Leahy Scale: Fair Sitting balance - Comments: supervision sitting EOB Postural control: Posterior lean (posterior lean when performing BLE LAQ sitting EOB) Standing balance support: Bilateral upper extremity supported, During  functional activity, Reliant on assistive device for balance Standing balance-Leahy Scale: Poor Standing balance comment: BUE support on RW                            Cognition Arousal/Alertness: Awake/alert Behavior During Therapy: WFL for tasks assessed/performed Overall Cognitive Status: Within Functional Limits for tasks assessed                                 General Comments: Pleasant, follows commands throughout session.        Exercises General Exercises - Lower Extremity Long Arc Quad: AROM, Strengthening, Right, 10 reps, Seated (pt with improving ROM with each repetition)    General Comments General comments (skin integrity, edema, etc.): Pt c/o slight dizziness with supine>sit but reports this improves with time. Attempted to check BP with RUE but pt unable to tolerate & unable to use LUE 2/2 IV. Pt feeling better by time PT able to place BP cuff on either BLE or L wrist.      Pertinent Vitals/Pain Pain Assessment Pain Assessment: Faces Faces Pain Scale: Hurts even more Pain Location: RLE Pain Descriptors / Indicators: Grimacing, Guarding, Discomfort Pain Intervention(s): Monitored during session, Repositioned    Home Living                          Prior Function            PT Goals (current goals can now be found in the care plan section) Acute Rehab PT Goals Patient Stated Goal: return home after rehab PT Goal Formulation: With patient Time For Goal Achievement: 02/18/22 Potential to Achieve Goals: Good Progress towards PT goals: Progressing toward goals    Frequency    Min 3X/week      PT Plan Current plan remains appropriate    Co-evaluation              AM-PAC PT "6 Clicks" Mobility   Outcome Measure  Help needed turning from your back to your side while in a flat bed without using bedrails?: A Lot Help needed moving from lying on your back to sitting on the side of a flat bed without using  bedrails?: Total Help needed moving to and from a bed to a chair (including a wheelchair)?: A Lot Help needed standing up from a chair using your arms (e.g., wheelchair or bedside chair)?: A Lot Help needed to walk in hospital room?: Total Help needed climbing 3-5 steps with a railing? : Total 6 Click Score: 9    End of Session Equipment Utilized During Treatment: Gait belt Activity Tolerance: Patient limited by fatigue Patient left: in chair;with call bell/phone within reach Nurse Communication: Mobility status PT Visit Diagnosis: Unsteadiness on feet (R26.81);Muscle weakness (generalized) (M62.81);Difficulty in walking, not elsewhere classified (R26.2);Pain Pain - Right/Left: Right Pain - part of body: Hip     Time: 1410-1434 PT Time Calculation (min) (ACUTE ONLY): 24 min  Charges:  $Therapeutic Activity: 23-37 mins                     Lavone Nian, PT, DPT 02/05/22, 2:45 PM  Mallory  Loleta Murphy 02/05/2022, 2:42 PM

## 2022-02-05 NOTE — Progress Notes (Signed)
Orthopaedic Trauma Progress Note  SUBJECTIVE: Doing well this morning.  Pain controlled on current regimen.  No chest pain. No SOB. No nausea/vomiting. No other complaints.    OBJECTIVE:  Vitals:   02/04/22 2041 02/05/22 0811  BP: (!) 146/61 (!) 164/56  Pulse: 77 88  Resp: 16 18  Temp: 98.5 F (36.9 C) 99 F (37.2 C)  SpO2: 97% 95%    General: Sitting up in bed, no acute distress Respiratory: No increased work of breathing.  Right lower extremity: Ace wrap removed, mepilex dressings CDI. Mildly sore throughout the thigh but no significant pain with palpation.  Ankle dorsiflexion/plantarflexion intact. Tolerates gentle knee ROM but notes some discomfort with this. Able to wiggle the toes.  Endorses sensation throughout all aspects of the lower leg, ankle, foot.  Compartments soft and compressible.  Foot warm and well-perfused.  2+ DP pulse  IMAGING: Stable post op imaging.   LABS:  Results for orders placed or performed during the hospital encounter of 02/01/22 (from the past 24 hour(s))  CBC     Status: Abnormal   Collection Time: 02/05/22  6:18 AM  Result Value Ref Range   WBC 14.1 (H) 4.0 - 10.5 K/uL   RBC 2.51 (L) 3.87 - 5.11 MIL/uL   Hemoglobin 7.7 (L) 12.0 - 15.0 g/dL   HCT 22.6 (L) 36.0 - 46.0 %   MCV 90.0 80.0 - 100.0 fL   MCH 30.7 26.0 - 34.0 pg   MCHC 34.1 30.0 - 36.0 g/dL   RDW 13.2 11.5 - 15.5 %   Platelets 207 150 - 400 K/uL   nRBC 0.0 0.0 - 0.2 %    ASSESSMENT: Mallory Murphy is a 76 y.o. female, 2 Days Post-Op s/p OPEN REDUCTION INTERNAL FIXATION OF RIGHT DISTAL PERI-PROSTHETIC FEMUR FRACTURE  CV/Blood loss: Acute blood loss anemia, Hgb 7.7 this morning. Continue to monitor. Hemodynamically stable  PLAN: Weightbearing: WBAT RLE ROM: Okay for unrestricted knee ROM Incisional and dressing care: OK to leave incisions open to air Showering: Okay for incisions to begin getting wet 02/06/2022 Orthopedic device(s): None  Pain management:  1. Tylenol 650  mg q 6 hours PRN 2. Percocet 5-325 mg q 4 hours PRN 3. Dilaudid 0.5 mg q 4 hours PRN VTE prophylaxis: Lovenox, SCDs ID:  Ancef 2gm post op completed Foley/Lines:  No foley, KVO IVFs Impediments to Fracture Healing: Vitamin D level 33, no additional supplementation indicated Dispo: PT/OT recommending SNF.  TOC following to arrange this. Okay for discharge from ortho standpoint once cleared by medicine team and therapies D/C recommendations: -Percocet for pain control -Eliquis 2.5 mg twice daily x30 days for DVT prophylaxis -No need for additional Vit D supplementation  Follow - up plan: 2 weeks after discharge for wound check and repeat x-rays   Contact information:  Katha Hamming MD, Rushie Nyhan PA-C. After hours and holidays please check [email or web address] for group call information for [facility]   Gwinda Passe, PA-C [phone] (office) [email or web address]

## 2022-02-06 NOTE — Care Management Important Message (Signed)
Important Message  Patient Details  Name: Mallory Murphy MRN: [id number] Date of Birth: February 02, 1946   Medicare Important Message Given:  Yes     Hannah Beat 02/06/2022, 9:00 AM

## 2022-02-06 NOTE — Progress Notes (Signed)
Pt report given to [facility] receiving [employer].Awaiting for transport as of this time.Mallory Murphy

## 2022-02-06 NOTE — Progress Notes (Signed)
Mobility Specialist Progress Note   02/06/22 1053  Mobility  Activity Transferred from bed to chair  Level of Assistance Moderate assist, patient does 50-74%  Assistive Device Front wheel walker  Distance Ambulated (ft) 2 ft  RUE Weight Bearing WBAT  RLE Weight Bearing WBAT  Activity Response Tolerated well  $Mobility charge 1 Mobility   Pre Mobility: 164/78 BP Post Mobility: 160/84 BP  Received pt in bed agreeable to mobility despite 10/10 pain in RLE, pt requesting meds prior to mobilizing. Min<>ModA for bed mobility d/t decreased pain tolerance. ModA for stand, pivot turn to chair, pt needing physical assistance to move RLE during pivot but w/ time progressively tolerating more weight. To chair safely w/ call bell in reach and needs met.  Holland Falling Mobility Specialist Acute Rehab Office:  [phone]

## 2022-02-06 NOTE — Progress Notes (Signed)
Physical Therapy Treatment Patient Details Name: Mallory Murphy MRN: [id number] DOB: 12-Oct-1945 Today's Date: 02/06/2022   History of Present Illness Pt is a 76 yr old female who had a syncope episode when assisting cat and found to have a right distal femur fracture. Pt s/p 02/03/22 ORIF. PMH: anemia, HTN, anxiety/depression, ambulatory dysfunction, R and L total hip sx, total shoulder sx, R elbow arthoplasty    PT Comments    Pt received in recliner, agreeable to therapy session after premedication for nausea. Pt requesting alternate AD as RW is too difficult for her to manage given chronic BUE injuries/weakness and agreeable to trial B PF RW for return from chair>bed. Pt quick to fatigue but able to take steps ~2f from chair>bed with up to modA (+2 safety) and heavy assist for unfamiliar device management. Pt needing +2 modA for bed mobility due to RLE pain/fatigue. Pt continues to benefit from PT services to progress toward functional mobility goals. Follow-up recommendation updated below per discussion with supervising PT Katie Z as pt currently unable to ambulate to/from private vehicle for transport to SNF, will need wheelchair van or PTAR.   Recommendations for follow up therapy are one component of a multi-disciplinary discharge planning process, led by the attending physician.  Recommendations may be updated based on patient status, additional functional criteria and insurance authorization.  Follow Up Recommendations  Skilled nursing-short term rehab (<3 hours/day) Can patient physically be transported by private vehicle: No   Assistance Recommended at Discharge Frequent or constant Supervision/Assistance  Patient can return home with the following Two people to help with walking and/or transfers;A lot of help with bathing/dressing/bathroom;Assistance with cooking/housework;Assist for transportation   Equipment Recommendations  None recommended by PT (TBD; consider bilateral  platform RW (may need youth size) as pt reports chronic BUE weakness)    Recommendations for Other Services       Precautions / Restrictions Precautions Precautions: Fall Precaution Comments: watch BP with orthostatics, per pt with RUE can not lift more then 5 pounds due to old sx Restrictions Weight Bearing Restrictions: Yes RUE Weight Bearing: Weight bearing as tolerated RLE Weight Bearing: Weight bearing as tolerated     Mobility  Bed Mobility Overal bed mobility: Needs Assistance Bed Mobility: Sit to Supine       Sit to supine: Mod assist, +2 for physical assistance   General bed mobility comments: RLE and light shoulder assist to lower back to supine, then cues for using "good" LLE to bridge hips toward her L to center of bed    Transfers Overall transfer level: Needs assistance Equipment used: Rolling walker (2 wheels), Bilateral platform walker Transfers: Sit to/from Stand, Bed to chair/wheelchair/BSC Sit to Stand: +2 safety/equipment, Mod assist (cuing for hand placement, assistance to power up to standing)   Step pivot transfers: Mod assist, +2 safety/equipment       General transfer comment: Pt transfers recliner>bed on her L with B PF RW & mod assist with extra time for weight shifting L<>R, advancing R foot, & turning RW. PTA provides educational cuing to square up to surface, reach back with BUE, and eccentric control with stand>sit with good return demo from pt. Family member present standing by for safety. Pt needs manual assist to move PF RW    Ambulation/Gait Ambulation/Gait assistance: Mod assist Gait Distance (Feet): 6 Feet Assistive device: Rolling walker (2 wheels), Bilateral platform walker Gait Pattern/deviations: Step-to pattern, Antalgic, Trunk flexed, Decreased step length - right, Decreased step length - left  General Gait Details: quick to fatigue, mild dizziness reported, manual assist needed with RW see above      Balance Overall  balance assessment: Needs assistance Sitting-balance support: Feet supported, Bilateral upper extremity supported, Single extremity supported Sitting balance-Leahy Scale: Fair Sitting balance - Comments: supervision sitting EOB   Standing balance support: Reliant on assistive device for balance, Bilateral upper extremity supported Standing balance-Leahy Scale: Poor Standing balance comment: BUE support on RW                            Cognition Arousal/Alertness: Awake/alert Behavior During Therapy: WFL for tasks assessed/performed Overall Cognitive Status: Within Functional Limits for tasks assessed                                 General Comments: Pleasant, follows commands throughout session. Family member present and very supportive.        Exercises General Exercises - Lower Extremity Ankle Circles/Pumps: AROM, Both, 10 reps, Seated    General Comments General comments (skin integrity, edema, etc.): VSS on RA; ice obtained for sore RLE      Pertinent Vitals/Pain Pain Assessment Pain Assessment: Faces Faces Pain Scale: Hurts even more Pain Location: RLE Pain Descriptors / Indicators: Grimacing, Guarding, Discomfort Pain Intervention(s): Limited activity within patient's tolerance, Premedicated before session, Monitored during session, Repositioned, Ice applied           PT Goals (current goals can now be found in the care plan section) Acute Rehab PT Goals Patient Stated Goal: return home after rehab PT Goal Formulation: With patient Time For Goal Achievement: 02/18/22 Progress towards PT goals: Progressing toward goals    Frequency    Min 3X/week      PT Plan Current plan remains appropriate       AM-PAC PT "6 Clicks" Mobility   Outcome Measure  Help needed turning from your back to your side while in a flat bed without using bedrails?: A Lot Help needed moving from lying on your back to sitting on the side of a flat bed  without using bedrails?: Total Help needed moving to and from a bed to a chair (including a wheelchair)?: A Lot Help needed standing up from a chair using your arms (e.g., wheelchair or bedside chair)?: A Lot Help needed to walk in hospital room?: Total Help needed climbing 3-5 steps with a railing? : Total 6 Click Score: 9    End of Session Equipment Utilized During Treatment: Gait belt Activity Tolerance: Patient limited by pain Patient left: in bed;with call bell/phone within reach;with bed alarm set;with family/visitor present;with SCD's reapplied Nurse Communication: Mobility status PT Visit Diagnosis: Unsteadiness on feet (R26.81);Muscle weakness (generalized) (M62.81);Difficulty in walking, not elsewhere classified (R26.2);Pain Pain - Right/Left: Right Pain - part of body: Hip     Time: 1230-1250 PT Time Calculation (min) (ACUTE ONLY): 20 min  Charges:  $Therapeutic Activity: 8-22 mins                     Oluwadamilola Deliz P., PTA Acute Rehabilitation Services Secure Chat Preferred 9a-5:30pm Office: [place] 02/06/2022, 3:26 PM

## 2022-02-06 NOTE — TOC Transition Note (Signed)
Transition of Care [facility]) - CM/SW Discharge Note   Patient Details  Name: Mallory Murphy MRN: [id number] Date of Birth: 10/31/1945  [facility]) CM/SW Contact:  Joanne Chars, LCSW Phone Number: 02/06/2022, 10:08 AM   Clinical Narrative:   Pt discharging to [facility].  RN call report to [phone].    Final next level of care: [place] Barriers to Discharge: Barriers Resolved   Patient Goals and CMS Choice Patient states their goals for this hospitalization and ongoing recovery are:: get back to what I was before   Choice offered to / list presented to : Patient (first choice is [name])  Discharge Placement              Patient [facility] bed at: [facility] Patient to be transferred to facility by: [place] Name of family member notified: neice [name] Patient and family notified of of transfer: 02/06/22  Discharge Plan and Services In-house Referral: Clinical Social Work   Post Acute Care Choice: [place]                               Social Determinants of Health (SDOH) Interventions     Readmission Risk Interventions     No data to display

## 2022-02-06 NOTE — TOC Progression Note (Signed)
Transition of Care [facility]) - Progression Note    Patient Details  Name: Mallory Murphy MRN: [id number] Date of Birth: 1946/01/12  [facility]) CM/SW Contact  Joanne Chars, LCSW Phone Number: 02/06/2022, 9:59 AM  Clinical Narrative:   CSW spoke with niece margaret about providing transportation to SNF.  She does not agree that pt does not need EMS transport, wants pt to go by PTAR.  Discussed that pt could receive bill for this and she is aware and still wants to use PT[facility] for transport.     Expected Discharge Plan: [place] Barriers to Discharge: Continued Medical Work up, SNF Pending bed offer  Expected Discharge Plan and Services Expected Discharge Plan: [place] In-house Referral: Clinical Social Work   Post Acute Care Choice: [place] Living arrangements for the past 2 months: Single Family Home Expected Discharge Date: 02/06/22                                     Social Determinants of Health (SDOH) Interventions    Readmission Risk Interventions     No data to display

## 2022-02-06 NOTE — Discharge Summary (Signed)
Physician Discharge Summary  Mathea Frieling IRC:[id number] DOB: 04-01-1946 DOA: 02/01/2022  PCP: Seward Carol, MD  Admit date: 02/01/2022 Discharge date: 02/06/2022  Admitted From: Home Disposition:  SNF  Recommendations for Outpatient Follow-up:  Follow up with PCP in 1-2 weeks Please obtain BMP/CBC in one week   Home Health:no Equipment/Devices:None  Discharge Condition:Stable CODE STATUS:Full Diet recommendation: Heart Healthy  Brief/Interim Summary: 76 y.o. female past medical history significant for iron deficiency anemia, rheumatoid arthritis comes in after experiencing a syncopal episode work-up revealed a right distal femur fracture, orthopedic surgery was consulted who recommended or are I have on 02/03/2022  Discharge Diagnoses:  Principal Problem:   Femur fracture, right ([place]) Active Problems:   Orthostatic syncope   Chronic kidney disease, stage 3a ([place])   Normocytic anemia   Leukocytosis  Right femur fracture: Orthopedic surgery was consulted to perform internal fixation of the right distal femur. She was started on narcotic and bowel regimen which controlled her pain. Physical therapy evaluated the patient, she will need to go to rehab temporarily.  Orthostatic hypotension: Likely the cause of her syncope, this resolved with fluid resuscitation. We will have to monitor her blood pressure as an outpatient if her orthostasis remain negative can start her on a diuretic for better control of her blood pressure.  Essential hypertension: No change made to her medication, she will need orthostatic vitals check as an outpatient if they remain negative can titrate antihypertensive medications as needed.  Normocytic anemia: Her hemoglobin dropped to 7.7 no signs of overt bleeding likely due to surgical intervention we will continue Aranesp injection as an outpatient check a CBC in 1 week at the skilled nursing facility.  Chronic kidney disease stage  IIIa: Appears to be at baseline.  Leukocytosis: Remained afebrile likely reactive from trauma event there is resolved.  Discharge Instructions  Discharge Instructions     Diet - low sodium heart healthy   Complete by: As directed    Diet - low sodium heart healthy   Complete by: As directed    Increase activity slowly   Complete by: As directed    Increase activity slowly   Complete by: As directed    No wound care   Complete by: As directed    No wound care   Complete by: As directed       Allergies as of 02/06/2022       Reactions   Macrobid [nitrofurantoin Macrocrystal] Nausea Only        Medication List     TAKE these medications    apixaban 2.5 MG Tabs tablet Commonly known as: Eliquis Take 1 tablet (2.5 mg total) by mouth 2 (two) times daily.   ARANESP IJ Inject 300 mcg into the skin See admin instructions. 300 mcg into the skin every 3 months for Hgb <11   diphenhydrAMINE 25 MG tablet Commonly known as: BENADRYL Take 25 mg by mouth at bedtime.   escitalopram 10 MG tablet Commonly known as: LEXAPRO Take 10 mg by mouth at bedtime.   leflunomide 20 MG tablet Commonly known as: ARAVA Take 20 mg by mouth at bedtime.   losartan 50 MG tablet Commonly known as: COZAAR Take 50 mg by mouth at bedtime.   oxybutynin 5 MG tablet Commonly known as: DITROPAN Take 5 mg by mouth at bedtime.   oxyCODONE-acetaminophen 5-325 MG tablet Commonly known as: PERCOCET/ROXICET Take 1 tablet by mouth every 4 (four) hours as needed for moderate pain.   Simponi Aria 50  MG/4ML Soln injection Generic drug: golimumab Inject into the vein every 8 (eight) weeks.        Follow-up Information     Haddix, Thomasene Lot, MD. Schedule an appointment as soon as possible for a visit in 2 week(s).   Specialty: Orthopedic Surgery Why: for wound check and repeat x-rays Contact information: [place] [address] [phone]                Allergies   Allergen Reactions   Macrobid [Nitrofurantoin Macrocrystal] Nausea Only    Consultations: Orthopedic surgery   Procedures/Studies: DG FEMUR PORT, MIN 2 VIEWS RIGHT  Result Date: 02/03/2022 CLINICAL DATA:  Femur fracture. EXAM: RIGHT FEMUR PORTABLE 2 VIEW COMPARISON:  Intraoperative fluoroscopy ORIF of right femur 02/03/2022, pelvis and right hip radiographs 02/01/2022, right knee radiographs 02/01/2022 (multiple studies) FINDINGS: Redemonstration of total right hip arthroplasty. 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 New long lateral plate and screw fixation of the distal fibula, traversing the known comminuted and displaced/angulated distal femoral fracture just proximal to total right knee arthroplasty hardware. There is improved alignment with persistent mild posterior approximately 5 mm displacement of the distal fracture component with respect to the proximal fracture component. There is diffuse decreased bone mineralization. IMPRESSION: New lateral plate and screw fixation of the distal fibula, traversing the known comminuted and displaced/angulated distal femoral fracture. There is improved alignment. Electronically Signed   By: Yvonne Kendall M.D.   On: 02/03/2022 10:17   DG FEMUR, MIN 2 VIEWS RIGHT  Result Date: 02/03/2022 CLINICAL DATA:  [id number] Elective surgery [id number] EXAM: RIGHT FEMUR 2 VIEWS COMPARISON:  None Available. FINDINGS: Intraoperative fluoroscopic images during lateral plate fixation of the distal femur. Partially visualized right hip arthroplasty. Prior total knee arthroplasty. Improved fracture alignment. No immediate complication. IMPRESSION: Intraoperative fluoroscopic images during plate fixation of the distal femur. Improved fracture alignment. No evidence of immediate complication. Electronically Signed   By: Maurine Simmering M.D.   On: 02/03/2022 09:00   DG  C-Arm 1-60 Min-No Report  Result Date: 02/03/2022 Fluoroscopy was utilized by the requesting physician.  No radiographic interpretation.   DG C-Arm 1-60 Min-No Report  Result Date: 02/03/2022 Fluoroscopy was utilized by the requesting physician.  No radiographic interpretation.   ECHOCARDIOGRAM COMPLETE  Result Date: 02/02/2022    ECHOCARDIOGRAM REPORT   Patient Name:   KARSTYN BIRKEY Date of Exam: 02/02/2022 Medical Rec #:  [id number]          Height:       61.0 in Accession #:    [id number]         Weight:       136.0 lb Date of Birth:  10-Jan-1946           BSA:          1.603 m Patient Age:    29 years           BP:           131/60 mmHg Patient Gender: F                  HR:           96 bpm. Exam Location:  Inpatient Procedure: 2D Echo Indications:    Syncope  History:        Patient has prior history of Echocardiogram examinations, most  recent 06/22/2014. Signs/Symptoms:Syncope; Risk                 Factors:Hypertension.  Sonographer:    Harvie Junior Referring Phys: [id number] Vernelle Emerald  Sonographer Comments: Technically difficult study due to poor echo windows. Sypine. IMPRESSIONS  1. Left ventricular ejection fraction, by estimation, is 60 to 65%. The left ventricle has normal function. The left ventricle has no regional wall motion abnormalities. There is moderate asymmetric left ventricular hypertrophy of the basal-septal segment. Left ventricular diastolic parameters are consistent with Grade I diastolic dysfunction (impaired relaxation).  2. Right ventricular systolic function is normal. The right ventricular size is normal. There is normal pulmonary artery systolic pressure.  3. The mitral valve is normal in structure. No evidence of mitral valve regurgitation. No evidence of mitral stenosis.  4. The aortic valve is tricuspid. Aortic valve regurgitation is mild to moderate. No aortic stenosis is present.  5. The inferior vena cava is normal in size with greater than  50% respiratory variability, suggesting right atrial pressure of 3 mmHg. FINDINGS  Left Ventricle: Left ventricular ejection fraction, by estimation, is 60 to 65%. The left ventricle has normal function. The left ventricle has no regional wall motion abnormalities. The left ventricular internal cavity size was normal in size. There is  moderate asymmetric left ventricular hypertrophy of the basal-septal segment. Left ventricular diastolic parameters are consistent with Grade I diastolic dysfunction (impaired relaxation). Normal left ventricular filling pressure. Right Ventricle: The right ventricular size is normal. Right vetricular wall thickness was not well visualized. Right ventricular systolic function is normal. There is normal pulmonary artery systolic pressure. The tricuspid regurgitant velocity is 2.65 m/s, and with an assumed right atrial pressure of 3 mmHg, the estimated right ventricular systolic pressure is 43.3 mmHg. Left Atrium: Left atrial size was normal in size. Right Atrium: Right atrial size was normal in size. Pericardium: There is no evidence of pericardial effusion. Mitral Valve: The mitral valve is normal in structure. No evidence of mitral valve regurgitation. No evidence of mitral valve stenosis. Tricuspid Valve: The tricuspid valve is not well visualized. Tricuspid valve regurgitation is mild . No evidence of tricuspid stenosis. Aortic Valve: The aortic valve is tricuspid. Aortic valve regurgitation is mild to moderate. Aortic regurgitation PHT measures 259 msec. No aortic stenosis is present. Aortic valve mean gradient measures 3.0 mmHg. Aortic valve peak gradient measures 6.0 mmHg. Aortic valve area, by VTI measures 3.16 cm. Pulmonic Valve: The pulmonic valve was not well visualized. Pulmonic valve regurgitation is not visualized. No evidence of pulmonic stenosis. Aorta: The aortic root and ascending aorta are structurally normal, with no evidence of dilitation. Venous: The inferior  vena cava is normal in size with greater than 50% respiratory variability, suggesting right atrial pressure of 3 mmHg. IAS/Shunts: No atrial level shunt detected by color flow Doppler.  LEFT VENTRICLE PLAX 2D LVIDd:         3.50 cm     Diastology LVIDs:         3.00 cm     LV e' medial:    6.74 cm/s LV PW:         1.00 cm     LV E/e' medial:  8.5 LV IVS:        1.00 cm     LV e' lateral:   8.05 cm/s LVOT diam:     2.00 cm     LV E/e' lateral: 7.1 LV SV:  65 LV SV Index:   40 LVOT Area:     3.14 cm  LV Volumes (MOD) LV vol d, MOD A2C: 66.1 ml LV vol d, MOD A4C: 61.0 ml LV vol s, MOD A2C: 32.7 ml LV vol s, MOD A4C: 30.2 ml LV SV MOD A2C:     33.4 ml LV SV MOD A4C:     61.0 ml LV SV MOD BP:      32.3 ml RIGHT VENTRICLE RV Basal diam:  2.70 cm RV Mid diam:    2.30 cm RV S prime:     14.00 cm/s TAPSE (M-mode): 1.6 cm LEFT ATRIUM             Index        RIGHT ATRIUM          Index LA diam:        2.90 cm 1.81 cm/m   RA Area:     8.75 cm LA Vol (A2C):   27.8 ml 17.34 ml/m  RA Volume:   14.30 ml 8.92 ml/m LA Vol (A4C):   31.3 ml 19.52 ml/m LA Biplane Vol: 29.1 ml 18.15 ml/m  AORTIC VALVE                    PULMONIC VALVE AV Area (Vmax):    3.01 cm     PV Vmax:          0.99 m/s AV Area (Vmean):   3.19 cm     PV Peak grad:     3.9 mmHg AV Area (VTI):     3.16 cm     PR End Diast Vel: 2.65 msec AV Vmax:           122.00 cm/s AV Vmean:          82.800 cm/s AV VTI:            0.205 m AV Peak Grad:      6.0 mmHg AV Mean Grad:      3.0 mmHg LVOT Vmax:         117.00 cm/s LVOT Vmean:        84.000 cm/s LVOT VTI:          0.206 m LVOT/AV VTI ratio: 1.00 AI PHT:            259 msec  AORTA Ao Root diam: 3.50 cm Ao Asc diam:  3.50 cm MITRAL VALVE               TRICUSPID VALVE MV Area (PHT): 4.71 cm    TR Peak grad:   28.1 mmHg MV Decel Time: 161 msec    TR Vmax:        265.00 cm/s MV E velocity: 57.40 cm/s MV A velocity: 94.30 cm/s  SHUNTS MV E/A ratio:  0.61        Systemic VTI:  0.21 m                             Systemic Diam: 2.00 cm Carlyle Dolly MD Electronically signed by Carlyle Dolly MD Signature Date/Time: 02/02/2022/12:12:33 PM    Final    DG Chest 1 View  Result Date: 02/02/2022 CLINICAL DATA:  Syncopal episode with suspicion for pneumonia EXAM: CHEST  1 VIEW COMPARISON:  Chest radiograph dated 06/20/2016 FINDINGS: Slightly low lung volumes. Hazy opacity silhouetting the right heart border. The heart size and mediastinal contours are within normal limits. Aortic  atherosclerosis. Partially imaged left shoulder arthroplasty and right humeral intramedullary nail. Slightly increased lucency surrounding the proximal aspect of the intramedullary nail. Severe degenerative changes of the right glenohumeral joint. IMPRESSION: 1. Hazy opacity silhouetting the right heart border, which may represent atelectasis or pneumonia. 2. Slightly increased lucency surrounding the proximal aspect of the right humeral intramedullary nail, which could represent loosening. Electronically Signed   By: Darrin Nipper M.D.   On: 02/02/2022 08:49   DG Knee 2 Views Right  Result Date: 02/01/2022 CLINICAL DATA:  Post reduction. EXAM: RIGHT KNEE - 1-2 VIEW COMPARISON:  Earlier radiograph dated 02/01/2022. FINDINGS: Displaced and angulated fracture of the distal right femoral diaphysis above the ostia cc. The hardware is intact. The bones are osteopenic. There has been interval placement overlying immobilizer. IMPRESSION: Displaced and angulated fracture of the distal right femoral diaphysis. Electronically Signed   By: Anner Crete M.D.   On: 02/01/2022 20:41   DG Hip Unilat W or Wo Pelvis 2-3 Views Right  Result Date: 02/01/2022 CLINICAL DATA:  Fall. EXAM: DG HIP (WITH OR WITHOUT PELVIS) 2-3V RIGHT COMPARISON:  None Available. FINDINGS: Bilateral total hip arthroplasties. The arthroplasty components appear intact and in anatomic alignment. There is no acute fracture or dislocation. The bones are osteopenic. Degenerative changes  of the lower lumbar spine. The soft tissues are unremarkable. IMPRESSION: 1. No acute fracture or dislocation. 2. Bilateral total hip arthroplasties. Electronically Signed   By: Anner Crete M.D.   On: 02/01/2022 18:13   DG Knee 2 Views Right  Result Date: 02/01/2022 CLINICAL DATA:  Right knee pain after fall. EXAM: RIGHT KNEE - 1-2 VIEW COMPARISON:  None Available. FINDINGS: Status post right total knee arthroplasty. Severely angulated fracture is seen involving the distal right femoral shaft just above the prosthesis. IMPRESSION: Severely angulated fracture is seen involving the distal right femoral shaft just above prosthesis. Electronically Signed   By: Marijo Conception M.D.   On: 02/01/2022 16:20   (Echo, Carotid, EGD, Colonoscopy, ERCP)    Subjective: No new complaints  Discharge Exam: Vitals:   02/05/22 2020 02/06/22 0750  BP: (!) 128/34 (!) 163/72  Pulse: 83 94  Resp: 16 16  Temp: 98.4 F (36.9 C) 98.3 F (36.8 C)  SpO2: 93% 94%   Vitals:   02/05/22 0811 02/05/22 1731 02/05/22 2020 02/06/22 0750  BP: (!) 164/56 (!) 156/52 (!) 128/34 (!) 163/72  Pulse: 88 86 83 94  Resp: '18 18 16 16  '$ Temp: 99 F (37.2 C) 98.9 F (37.2 C) 98.4 F (36.9 C) 98.3 F (36.8 C)  TempSrc: Oral Oral Oral Oral  SpO2: 95%  93% 94%  Weight:      Height:        General: Pt is alert, awake, not in acute distress Cardiovascular: RRR, S1/S2 +, no rubs, no gallops Respiratory: CTA bilaterally, no wheezing, no rhonchi Abdominal: Soft, NT, ND, bowel sounds + Extremities: no edema, no cyanosis    The results of significant diagnostics from this hospitalization (including imaging, microbiology, ancillary and laboratory) are listed below for reference.     Microbiology: Recent Results (from the past 240 hour(s))  Surgical PCR screen     Status: Abnormal   Collection Time: 02/02/22  2:14 AM   Specimen: Nasal Mucosa; Nasal Swab  Result Value Ref Range Status   MRSA, PCR NEGATIVE NEGATIVE  Final   Staphylococcus aureus POSITIVE (A) NEGATIVE Final    Comment: RESULT CALLED TO, READ BACK BY AND VERIFIED WITH:  ESTEBAN, V @ [id number] [id number] JMK (NOTE) The Xpert SA Assay (FDA approved for NASAL specimens in patients 61 years of age and older), is one component of a comprehensive surveillance program. It is not intended to diagnose infection nor to guide or monitor treatment. Performed at [facility], [place] [address], [place], [place] [address]      Labs: BNP (last 3 results) No results for input(s): "BNP" in the last 8760 hours. Basic Metabolic Panel: Recent Labs  Lab 02/01/22 1756 02/02/22 0336 02/03/22 0258 02/04/22 0342  NA 135 137 136 134*  K 4.2 4.0 3.8 3.8  CL 106 106 106 106  CO2 '22 23 22 23  '$ GLUCOSE 109* 126* 109* 146*  BUN 22 24* 23 14  CREATININE 0.97 1.09* 1.18* 0.94  CALCIUM 8.2* 8.2* 8.1* 8.6*  MG  --  1.8 1.7  --   PHOS  --  3.9  --   --    Liver Function Tests: Recent Labs  Lab 02/01/22 1756 02/02/22 0336 02/03/22 0258  AST '27 23 24  '$ ALT '12 13 13  '$ ALKPHOS 73 71 60  BILITOT 0.9 0.7 0.4  PROT 7.2 6.6 5.9*  ALBUMIN 3.4* 3.2* 2.7*   No results for input(s): "LIPASE", "AMYLASE" in the last 168 hours. No results for input(s): "AMMONIA" in the last 168 hours. CBC: Recent Labs  Lab 02/01/22 1641 02/02/22 0336 02/03/22 0258 02/04/22 0342 02/05/22 0618  WBC 17.0* 13.0* 10.7* 18.7* 14.1*  NEUTROABS 13.9* 8.9* 6.4  --   --   HGB 12.0 10.0* 8.5* 8.0* 7.7*  HCT 36.4 30.4* 25.8* 24.3* 22.6*  MCV 90.5 91.3 92.5 91.0 90.0  PLT 269 223 188 178 207   Cardiac Enzymes: No results for input(s): "CKTOTAL", "CKMB", "CKMBINDEX", "TROPONINI" in the last 168 hours. BNP: Invalid input(s): "POCBNP" CBG: No results for input(s): "GLUCAP" in the last 168 hours. D-Dimer No results for input(s): "DDIMER" in the last 72 hours. Hgb A1c No results for input(s): "HGBA1C" in the last 72 hours. Lipid Profile No results for input(s): "CHOL",  "HDL", "LDLCALC", "TRIG", "CHOLHDL", "LDLDIRECT" in the last 72 hours. Thyroid function studies No results for input(s): "TSH", "T4TOTAL", "T3FREE", "THYROIDAB" in the last 72 hours.  Invalid input(s): "FREET3" Anemia work up No results for input(s): "VITAMINB12", "FOLATE", "FERRITIN", "TIBC", "IRON", "RETICCTPCT" in the last 72 hours. Urinalysis    Component Value Date/Time   COLORURINE YELLOW 02/01/2022 2120   APPEARANCEUR CLEAR 02/01/2022 2120   LABSPEC 1.010 02/01/2022 2120   PHURINE 7.5 02/01/2022 2120   GLUCOSEU NEGATIVE 02/01/2022 2120   HGBUR NEGATIVE 02/01/2022 2120   BILIRUBINUR NEGATIVE 02/01/2022 2120   KETONESUR NEGATIVE 02/01/2022 2120   PROTEINUR NEGATIVE 02/01/2022 2120   UROBILINOGEN 1.0 05/17/2013 1129   NITRITE NEGATIVE 02/01/2022 2120   LEUKOCYTESUR NEGATIVE 02/01/2022 2120   Sepsis Labs Recent Labs  Lab 02/02/22 0336 02/03/22 0258 02/04/22 0342 02/05/22 0618  WBC 13.0* 10.7* 18.7* 14.1*   Microbiology Recent Results (from the past 240 hour(s))  Surgical PCR screen     Status: Abnormal   Collection Time: 02/02/22  2:14 AM   Specimen: Nasal Mucosa; Nasal Swab  Result Value Ref Range Status   MRSA, PCR NEGATIVE NEGATIVE Final   Staphylococcus aureus POSITIVE (A) NEGATIVE Final    Comment: RESULT CALLED TO, READ BACK BY AND VERIFIED WITH: ESTEBAN, V @ 0510 [id number] JMK (NOTE) The Xpert SA Assay (FDA approved for NASAL specimens in patients 105 years of age and older), is one component  of a comprehensive surveillance program. It is not intended to diagnose infection nor to guide or monitor treatment. Performed at [facility], [place] [address] [place],  [address]     SIGNED:   Charlynne Cousins, MD  [facility] 02/06/2022, 9:19 AM Pager   If 7PM-7AM, please contact night-coverage [email or web address] Password [email or web address]

## 2022-02-11 ENCOUNTER — Other Ambulatory Visit: Payer: Self-pay | Admitting: *Deleted

## 2022-02-11 NOTE — Patient Outreach (Signed)
Ms. Yankey resides in [facility]. Screening for potential care coordination services as benefit of insurance plan and PCP.   Update received from [facility] social worker indicating transition plan is to return home independently.   PCP office at [facility] at [facility] has [facility] care management.   Will continue to follow.   [name], MSN, RN,BSN [place] Acute Care Coordinator [phone] (Direct dial)

## 2022-02-18 ENCOUNTER — Other Ambulatory Visit: Payer: Medicare Other

## 2022-02-18 ENCOUNTER — Ambulatory Visit: Payer: Medicare Other

## 2022-02-19 ENCOUNTER — Other Ambulatory Visit: Payer: Self-pay | Admitting: *Deleted

## 2022-02-19 NOTE — Patient Outreach (Signed)
Mallory Murphy resides in [facility]. Screening for care coordination services as benefit of insurance plan and PCP.  Attended collaborative [employer] today. Therapy reports [name] is min/mod with transfers. Has decreased safety awareness. Lived at home alone prior.  PC[facility] office [facility] has [facility] care management.  Will continue to follow.  [name], MSN, RN,BSN [place] Acute Care Coordinator [phone] (Direct dial)

## 2022-02-25 ENCOUNTER — Ambulatory Visit: Payer: Medicare Other

## 2022-03-03 ENCOUNTER — Other Ambulatory Visit: Payer: Self-pay | Admitting: *Deleted

## 2022-03-03 NOTE — Patient Outreach (Signed)
[place] Coordinator follow up. Per [facility] Ms. Muller resides in [facility]. Screening for potential care coordination services as benefit of insurance plan and PCP.   Secure communication sent to SNF social worker to inquire about transition plans.   PCP office [facility] has [facility] care management.  Will continue to follow.  [name], MSN, RN,BSN [place] Acute Care Coordinator [phone] (Direct dial)

## 2022-03-04 ENCOUNTER — Other Ambulatory Visit: Payer: Self-pay | Admitting: *Deleted

## 2022-03-04 NOTE — Patient Outreach (Addendum)
Mallory Murphy Coordinator follow up. Mallory Murphy resides in [facility]. Screening for potential care coordination services as benefit of insurance plan and PCP.   Update received from [place]. Mallory Murphy will transition home on 03/05/22. Lives alone. Will have [facility] home health for PT/OT services. Lives alone.   PCP office [facility] at [facility] has [facility]stream care management. Will send secure notification to [facility] upon SNF discharge.   Mallory Rolling, MSN, RN,BSN [place] Acute Care Coordinator [phone] (Direct dial)

## 2022-03-11 ENCOUNTER — Other Ambulatory Visit: Payer: Self-pay | Admitting: *Deleted

## 2022-03-11 NOTE — Patient Outreach (Signed)
[place] Coordinator follow up. Verified in [facility] Ms. Offord discharged from [facility] on 03/05/22.  Secure notification sent to [facility] care management to make aware of discharge.   PCP office [facility] has [facility] care management.   [name], MSN, RN,BSN [facility] Acute Care Coordinator [phone] (Direct dial)

## 2022-03-12 ENCOUNTER — Encounter: Payer: Self-pay | Admitting: Family

## 2022-03-12 ENCOUNTER — Inpatient Hospital Stay (HOSPITAL_BASED_OUTPATIENT_CLINIC_OR_DEPARTMENT_OTHER): Payer: Medicare Other | Admitting: Family

## 2022-03-12 ENCOUNTER — Inpatient Hospital Stay: Payer: Medicare Other | Attending: Hematology & Oncology

## 2022-03-12 ENCOUNTER — Inpatient Hospital Stay: Payer: Medicare Other

## 2022-03-12 VITALS — BP 112/57 | HR 96 | Temp 98.0°F | Resp 18 | Ht 61.0 in | Wt 136.8 lb

## 2022-03-12 DIAGNOSIS — D472 Monoclonal gammopathy: Secondary | ICD-10-CM | POA: Insufficient documentation

## 2022-03-12 DIAGNOSIS — D500 Iron deficiency anemia secondary to blood loss (chronic): Secondary | ICD-10-CM

## 2022-03-12 DIAGNOSIS — N183 Chronic kidney disease, stage 3 unspecified: Secondary | ICD-10-CM | POA: Insufficient documentation

## 2022-03-12 DIAGNOSIS — E538 Deficiency of other specified B group vitamins: Secondary | ICD-10-CM

## 2022-03-12 DIAGNOSIS — E611 Iron deficiency: Secondary | ICD-10-CM | POA: Insufficient documentation

## 2022-03-12 DIAGNOSIS — D631 Anemia in chronic kidney disease: Secondary | ICD-10-CM | POA: Diagnosis not present

## 2022-03-12 DIAGNOSIS — D509 Iron deficiency anemia, unspecified: Secondary | ICD-10-CM | POA: Insufficient documentation

## 2022-03-12 LAB — CMP (CANCER CENTER ONLY)
ALT: 6 U/L (ref 0–44)
AST: 16 U/L (ref 15–41)
Albumin: 3.9 g/dL (ref 3.5–5.0)
Alkaline Phosphatase: 97 U/L (ref 38–126)
Anion gap: 10 (ref 5–15)
BUN: 18 mg/dL (ref 8–23)
CO2: 24 mmol/L (ref 22–32)
Calcium: 9.5 mg/dL (ref 8.9–10.3)
Chloride: 105 mmol/L (ref 98–111)
Creatinine: 1.12 mg/dL — ABNORMAL HIGH (ref 0.44–1.00)
GFR, Estimated: 51 mL/min — ABNORMAL LOW (ref >60)
Glucose, Bld: 137 mg/dL — ABNORMAL HIGH (ref 70–99)
Potassium: 3.8 mmol/L (ref 3.5–5.1)
Sodium: 139 mmol/L (ref 135–145)
Total Bilirubin: 0.4 mg/dL (ref 0.3–1.2)
Total Protein: 7.6 g/dL (ref 6.5–8.1)

## 2022-03-12 LAB — CBC WITH DIFFERENTIAL (CANCER CENTER ONLY)
Abs Immature Granulocytes: 0.03 10*3/uL (ref 0.00–0.07)
Basophils Absolute: 0 10*3/uL (ref 0.0–0.1)
Basophils Relative: 1 %
Eosinophils Absolute: 0.5 10*3/uL (ref 0.0–0.5)
Eosinophils Relative: 6 %
HCT: 31.5 % — ABNORMAL LOW (ref 36.0–46.0)
Hemoglobin: 10.1 g/dL — ABNORMAL LOW (ref 12.0–15.0)
Immature Granulocytes: 0 %
Lymphocytes Relative: 22 %
Lymphs Abs: 1.8 10*3/uL (ref 0.7–4.0)
MCH: 29.5 pg (ref 26.0–34.0)
MCHC: 32.1 g/dL (ref 30.0–36.0)
MCV: 92.1 fL (ref 80.0–100.0)
Monocytes Absolute: 0.7 10*3/uL (ref 0.1–1.0)
Monocytes Relative: 9 %
Neutro Abs: 4.9 10*3/uL (ref 1.7–7.7)
Neutrophils Relative %: 62 %
Platelet Count: 333 10*3/uL (ref 150–400)
RBC: 3.42 MIL/uL — ABNORMAL LOW (ref 3.87–5.11)
RDW: 14 % (ref 11.5–15.5)
WBC Count: 7.8 10*3/uL (ref 4.0–10.5)
nRBC: 0 % (ref 0.0–0.2)

## 2022-03-12 LAB — RETICULOCYTES
Immature Retic Fract: 10.1 % (ref 2.3–15.9)
RBC.: 3.42 MIL/uL — ABNORMAL LOW (ref 3.87–5.11)
Retic Count, Absolute: 49.6 10*3/uL (ref 19.0–186.0)
Retic Ct Pct: 1.5 % (ref 0.4–3.1)

## 2022-03-12 LAB — FERRITIN: Ferritin: 575 ng/mL — ABNORMAL HIGH (ref 11–307)

## 2022-03-12 LAB — VITAMIN B12: Vitamin B-12: 225 pg/mL (ref 180–914)

## 2022-03-12 LAB — LACTATE DEHYDROGENASE: LDH: 162 U/L (ref 98–192)

## 2022-03-12 MED ORDER — DARBEPOETIN ALFA 300 MCG/0.6ML IJ SOSY
300.0000 ug | PREFILLED_SYRINGE | Freq: Once | INTRAMUSCULAR | Status: AC
  Administered 2022-03-12: 300 ug via SUBCUTANEOUS
  Filled 2022-03-12: qty 0.6

## 2022-03-12 NOTE — Progress Notes (Signed)
Hematology and Oncology Follow Up Visit  Mallory Murphy [id number] 1945-10-13 77 y.o. 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 is here today with her daughter for follow-up and injection. 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  Her Hgb has improved to 10.1 since discharge from the hospital. She was able to get out of [place] right before Thanksgiving.  No new falls, no syncope.  She ambulates with a Rolator for added support.  Occasional mild SOB with exertion. She takes a break to rest as needed.  No fever, chills, n/v, cough, rash, dizziness, chest pain, palpitations, abdominal pain or changes in bowel or bladder habits.  No blood loss noted. No abnormal bruising, no petechiae.  Appetite and hydration have been fair. She is drinking a protein drink daily. Weight is 136 lbs.  ECOG Performance Status: 1 - Symptomatic but completely ambulatory  Medications:  Allergies as of 03/12/2022       Reactions   Macrobid [nitrofurantoin Macrocrystal] Nausea Only        Medication List        Accurate as of March 12, 2022  1:14 PM. If you have any questions, ask your nurse or doctor.          apixaban 2.5 MG Tabs tablet Commonly known as: Eliquis Take 1 tablet (2.5 mg total) by mouth 2 (two) times daily.   ARANESP IJ Inject 300 mcg into the skin See admin instructions. 300 mcg into the skin every 3 months for Hgb <11   diphenhydrAMINE 25 MG tablet Commonly known as: BENADRYL Take 25 mg by mouth at bedtime.   escitalopram 10 MG tablet Commonly known as: LEXAPRO Take 10 mg by mouth at bedtime.   leflunomide 20 MG tablet Commonly known as: ARAVA Take 20 mg by mouth at bedtime.    losartan 50 MG tablet Commonly known as: COZAAR Take 50 mg by mouth at bedtime.   oxybutynin 5 MG tablet Commonly known as: DITROPAN Take 5 mg by mouth at bedtime.   oxyCODONE-acetaminophen 5-325 MG tablet Commonly known as: PERCOCET/ROXICET Take 1 tablet by mouth every 4 (four) hours as needed for moderate pain.   Simponi Aria 50 MG/4ML Soln injection Generic drug: golimumab Inject into the vein every 8 (eight) weeks.        Allergies:  Allergies  Allergen Reactions   Macrobid [Nitrofurantoin Macrocrystal] Nausea Only    Past Medical History, Surgical history, Social history, and Family History were reviewed and updated.  Review of Systems: All other 10 point review of systems is negative.   Physical Exam:  vitals were not taken for this visit.   Wt Readings from Last 3 Encounters:  02/01/22 136 lb (61.7 kg)  11/19/21 139 lb (63 kg)  07/17/21 137 lb (62.1 kg)    Ocular: Sclerae unicteric, pupils equal, round and reactive to light Ear-nose-throat: Oropharynx clear, dentition fair Lymphatic: No cervical or supraclavicular adenopathy Lungs no rales or rhonchi, good excursion bilaterally Heart regular rate and rhythm, no murmur appreciated Abd soft, nontender, positive bowel sounds MSK no focal spinal tenderness, no joint edema Neuro: non-focal, well-oriented, appropriate affect Breasts: Deferred   Lab Results  Component Value  Date   WBC 14.1 (H) 02/05/2022   HGB 7.7 (L) 02/05/2022   HCT 22.6 (L) 02/05/2022   MCV 90.0 02/05/2022   PLT 207 02/05/2022   Lab Results  Component Value Date   FERRITIN 543 (H) 11/19/2021   IRON 54 11/19/2021   TIBC 200 (L) 11/19/2021   UIBC 146 (L) 11/19/2021   IRONPCTSAT 27 11/19/2021   Lab Results  Component Value Date   RETICCTPCT 1.3 11/19/2021   RBC 2.51 (L) 02/05/2022   Lab Results  Component Value Date   KPAFRELGTCHN 50.6 (H) 11/19/2021   LAMBDASER 35.7 (H) 11/19/2021   KAPLAMBRATIO 1.42 11/19/2021   Lab  Results  Component Value Date   IGGSERUM 1,770 (H) 11/19/2021   IGA 400 11/19/2021   IGMSERUM 103 11/19/2021   Lab Results  Component Value Date   TOTALPROTELP 7.3 11/19/2021   ALBUMINELP 3.5 11/19/2021   A1GS 0.2 11/19/2021   A2GS 0.8 11/19/2021   BETS 1.0 11/19/2021   GAMS 1.9 (H) 11/19/2021   MSPIKE 0.9 (H) 11/19/2021   SPEI Comment 11/19/2021     Chemistry      Component Value Date/Time   NA 134 (L) 02/04/2022 0342   K 3.8 02/04/2022 0342   CL 106 02/04/2022 0342   CO2 23 02/04/2022 0342   BUN 14 02/04/2022 0342   CREATININE 0.94 02/04/2022 0342   CREATININE 0.87 01/28/2022 1351   CREATININE 1.19 (H) 05/07/2012 1553      Component Value Date/Time   CALCIUM 8.6 (L) 02/04/2022 0342   ALKPHOS 60 02/03/2022 0258   AST 24 02/03/2022 0258   AST 26 01/28/2022 1351   ALT 13 02/03/2022 0258   ALT 13 01/28/2022 1351   BILITOT 0.4 02/03/2022 0258   BILITOT 0.3 01/28/2022 1351       Impression and Plan: Mallory Murphy is a very pleasant 76 yo caucasian female with multifactorial anemia as well as IgG kappa MGUS.  Protein studies are pending.  ESA given, Hgb 10.1.  Iron studies pending.  Lab and injection monthly.  Follow-up in 4 months.   Lottie Dawson, NP 11/29/20231:14 PM

## 2022-03-13 LAB — IRON AND IRON BINDING CAPACITY (CC-WL,HP ONLY)
Iron: 31 ug/dL (ref 28–170)
Saturation Ratios: 16 % (ref 10.4–31.8)
TIBC: 200 ug/dL — ABNORMAL LOW (ref 250–450)
UIBC: 169 ug/dL (ref 148–442)

## 2022-03-13 LAB — KAPPA/LAMBDA LIGHT CHAINS
Kappa free light chain: 63.2 mg/L — ABNORMAL HIGH (ref 3.3–19.4)
Kappa, lambda light chain ratio: 1.56 (ref 0.26–1.65)
Lambda free light chains: 40.6 mg/L — ABNORMAL HIGH (ref 5.7–26.3)

## 2022-03-14 LAB — IGG, IGA, IGM
IgA: 407 mg/dL (ref 64–422)
IgG (Immunoglobin G), Serum: 1706 mg/dL — ABNORMAL HIGH (ref 586–1602)
IgM (Immunoglobulin M), Srm: 102 mg/dL (ref 26–217)

## 2022-03-14 LAB — PROTEIN ELECTROPHORESIS, SERUM
A/G Ratio: 0.9 (ref 0.7–1.7)
Albumin ELP: 3.3 g/dL (ref 2.9–4.4)
Alpha-1-Globulin: 0.3 g/dL (ref 0.0–0.4)
Alpha-2-Globulin: 0.9 g/dL (ref 0.4–1.0)
Beta Globulin: 1 g/dL (ref 0.7–1.3)
Gamma Globulin: 1.7 g/dL (ref 0.4–1.8)
Globulin, Total: 3.8 g/dL (ref 2.2–3.9)
M-Spike, %: 0.7 g/dL — ABNORMAL HIGH
Total Protein ELP: 7.1 g/dL (ref 6.0–8.5)

## 2022-03-19 ENCOUNTER — Ambulatory Visit: Payer: Medicare Other

## 2022-03-19 ENCOUNTER — Ambulatory Visit: Payer: Medicare Other | Admitting: Family

## 2022-03-19 ENCOUNTER — Other Ambulatory Visit: Payer: Medicare Other

## 2022-03-21 ENCOUNTER — Ambulatory Visit: Payer: Medicare Other | Admitting: Family

## 2022-03-21 ENCOUNTER — Other Ambulatory Visit: Payer: Medicare Other

## 2022-03-21 ENCOUNTER — Ambulatory Visit: Payer: Medicare Other

## 2022-04-09 ENCOUNTER — Inpatient Hospital Stay: Payer: Medicare Other

## 2022-04-09 ENCOUNTER — Inpatient Hospital Stay: Payer: Medicare Other | Attending: Hematology & Oncology

## 2022-04-09 DIAGNOSIS — M069 Rheumatoid arthritis, unspecified: Secondary | ICD-10-CM | POA: Diagnosis not present

## 2022-04-09 DIAGNOSIS — N183 Chronic kidney disease, stage 3 unspecified: Secondary | ICD-10-CM | POA: Diagnosis present

## 2022-04-09 DIAGNOSIS — E538 Deficiency of other specified B group vitamins: Secondary | ICD-10-CM

## 2022-04-09 DIAGNOSIS — D500 Iron deficiency anemia secondary to blood loss (chronic): Secondary | ICD-10-CM

## 2022-04-09 DIAGNOSIS — D472 Monoclonal gammopathy: Secondary | ICD-10-CM | POA: Insufficient documentation

## 2022-04-09 DIAGNOSIS — D631 Anemia in chronic kidney disease: Secondary | ICD-10-CM | POA: Diagnosis present

## 2022-04-09 LAB — CMP (CANCER CENTER ONLY)
ALT: 10 U/L (ref 0–44)
AST: 20 U/L (ref 15–41)
Albumin: 4 g/dL (ref 3.5–5.0)
Alkaline Phosphatase: 94 U/L (ref 38–126)
Anion gap: 8 (ref 5–15)
BUN: 23 mg/dL (ref 8–23)
CO2: 24 mmol/L (ref 22–32)
Calcium: 9 mg/dL (ref 8.9–10.3)
Chloride: 105 mmol/L (ref 98–111)
Creatinine: 1.12 mg/dL — ABNORMAL HIGH (ref 0.44–1.00)
GFR, Estimated: 51 mL/min — ABNORMAL LOW (ref >60)
Glucose, Bld: 123 mg/dL — ABNORMAL HIGH (ref 70–99)
Potassium: 3.8 mmol/L (ref 3.5–5.1)
Sodium: 137 mmol/L (ref 135–145)
Total Bilirubin: 0.4 mg/dL (ref 0.3–1.2)
Total Protein: 7.6 g/dL (ref 6.5–8.1)

## 2022-04-09 LAB — CBC WITH DIFFERENTIAL (CANCER CENTER ONLY)
Abs Immature Granulocytes: 0.02 10*3/uL (ref 0.00–0.07)
Basophils Absolute: 0.1 10*3/uL (ref 0.0–0.1)
Basophils Relative: 1 %
Eosinophils Absolute: 0.6 10*3/uL — ABNORMAL HIGH (ref 0.0–0.5)
Eosinophils Relative: 7 %
HCT: 37.9 % (ref 36.0–46.0)
Hemoglobin: 12.2 g/dL (ref 12.0–15.0)
Immature Granulocytes: 0 %
Lymphocytes Relative: 28 %
Lymphs Abs: 2.2 10*3/uL (ref 0.7–4.0)
MCH: 29.5 pg (ref 26.0–34.0)
MCHC: 32.2 g/dL (ref 30.0–36.0)
MCV: 91.5 fL (ref 80.0–100.0)
Monocytes Absolute: 1 10*3/uL (ref 0.1–1.0)
Monocytes Relative: 12 %
Neutro Abs: 4 10*3/uL (ref 1.7–7.7)
Neutrophils Relative %: 52 %
Platelet Count: 209 10*3/uL (ref 150–400)
RBC: 4.14 MIL/uL (ref 3.87–5.11)
RDW: 15.1 % (ref 11.5–15.5)
WBC Count: 7.8 10*3/uL (ref 4.0–10.5)
nRBC: 0 % (ref 0.0–0.2)

## 2022-04-11 ENCOUNTER — Ambulatory Visit: Payer: Medicare Other

## 2022-04-11 ENCOUNTER — Other Ambulatory Visit: Payer: Medicare Other

## 2022-05-12 ENCOUNTER — Other Ambulatory Visit: Payer: Medicare Other

## 2022-05-12 ENCOUNTER — Ambulatory Visit: Payer: Medicare Other

## 2022-05-14 ENCOUNTER — Inpatient Hospital Stay: Payer: Medicare Other | Attending: Hematology & Oncology

## 2022-05-14 ENCOUNTER — Inpatient Hospital Stay: Payer: Medicare Other

## 2022-05-14 DIAGNOSIS — D472 Monoclonal gammopathy: Secondary | ICD-10-CM

## 2022-05-14 DIAGNOSIS — N189 Chronic kidney disease, unspecified: Secondary | ICD-10-CM | POA: Diagnosis present

## 2022-05-14 DIAGNOSIS — D631 Anemia in chronic kidney disease: Secondary | ICD-10-CM | POA: Diagnosis present

## 2022-05-14 DIAGNOSIS — D500 Iron deficiency anemia secondary to blood loss (chronic): Secondary | ICD-10-CM

## 2022-05-14 LAB — CBC WITH DIFFERENTIAL (CANCER CENTER ONLY)
Abs Immature Granulocytes: 0.02 10*3/uL (ref 0.00–0.07)
Basophils Absolute: 0 10*3/uL (ref 0.0–0.1)
Basophils Relative: 0 %
Eosinophils Absolute: 0.4 10*3/uL (ref 0.0–0.5)
Eosinophils Relative: 5 %
HCT: 35.2 % — ABNORMAL LOW (ref 36.0–46.0)
Hemoglobin: 11.7 g/dL — ABNORMAL LOW (ref 12.0–15.0)
Immature Granulocytes: 0 %
Lymphocytes Relative: 28 %
Lymphs Abs: 2.1 10*3/uL (ref 0.7–4.0)
MCH: 29.5 pg (ref 26.0–34.0)
MCHC: 33.2 g/dL (ref 30.0–36.0)
MCV: 88.9 fL (ref 80.0–100.0)
Monocytes Absolute: 1 10*3/uL (ref 0.1–1.0)
Monocytes Relative: 13 %
Neutro Abs: 4.1 10*3/uL (ref 1.7–7.7)
Neutrophils Relative %: 54 %
Platelet Count: 249 10*3/uL (ref 150–400)
RBC: 3.96 MIL/uL (ref 3.87–5.11)
RDW: 15.1 % (ref 11.5–15.5)
WBC Count: 7.6 10*3/uL (ref 4.0–10.5)
nRBC: 0 % (ref 0.0–0.2)

## 2022-05-14 LAB — CMP (CANCER CENTER ONLY)
ALT: 10 U/L (ref 0–44)
AST: 20 U/L (ref 15–41)
Albumin: 4 g/dL (ref 3.5–5.0)
Alkaline Phosphatase: 75 U/L (ref 38–126)
Anion gap: 7 (ref 5–15)
BUN: 25 mg/dL — ABNORMAL HIGH (ref 8–23)
CO2: 24 mmol/L (ref 22–32)
Calcium: 9.5 mg/dL (ref 8.9–10.3)
Chloride: 107 mmol/L (ref 98–111)
Creatinine: 0.91 mg/dL (ref 0.44–1.00)
GFR, Estimated: >60 mL/min (ref >60)
Glucose, Bld: 112 mg/dL — ABNORMAL HIGH (ref 70–99)
Potassium: 4.3 mmol/L (ref 3.5–5.1)
Sodium: 138 mmol/L (ref 135–145)
Total Bilirubin: 0.3 mg/dL (ref 0.3–1.2)
Total Protein: 7.3 g/dL (ref 6.5–8.1)

## 2022-05-14 NOTE — Progress Notes (Signed)
No injection. Parameters not met

## 2022-06-12 ENCOUNTER — Other Ambulatory Visit: Payer: Self-pay | Admitting: *Deleted

## 2022-06-12 ENCOUNTER — Inpatient Hospital Stay: Payer: Medicare Other | Attending: Hematology & Oncology

## 2022-06-12 ENCOUNTER — Inpatient Hospital Stay: Payer: Medicare Other

## 2022-06-12 DIAGNOSIS — E538 Deficiency of other specified B group vitamins: Secondary | ICD-10-CM

## 2022-06-12 DIAGNOSIS — D472 Monoclonal gammopathy: Secondary | ICD-10-CM

## 2022-06-12 DIAGNOSIS — D500 Iron deficiency anemia secondary to blood loss (chronic): Secondary | ICD-10-CM

## 2022-06-12 DIAGNOSIS — D631 Anemia in chronic kidney disease: Secondary | ICD-10-CM | POA: Diagnosis present

## 2022-06-12 DIAGNOSIS — N189 Chronic kidney disease, unspecified: Secondary | ICD-10-CM | POA: Diagnosis not present

## 2022-06-12 LAB — CBC WITH DIFFERENTIAL (CANCER CENTER ONLY)
Abs Immature Granulocytes: 0.02 10*3/uL (ref 0.00–0.07)
Basophils Absolute: 0 10*3/uL (ref 0.0–0.1)
Basophils Relative: 0 %
Eosinophils Absolute: 0.4 10*3/uL (ref 0.0–0.5)
Eosinophils Relative: 5 %
HCT: 34.4 % — ABNORMAL LOW (ref 36.0–46.0)
Hemoglobin: 11.6 g/dL — ABNORMAL LOW (ref 12.0–15.0)
Immature Granulocytes: 0 %
Lymphocytes Relative: 26 %
Lymphs Abs: 2.1 10*3/uL (ref 0.7–4.0)
MCH: 30.1 pg (ref 26.0–34.0)
MCHC: 33.7 g/dL (ref 30.0–36.0)
MCV: 89.1 fL (ref 80.0–100.0)
Monocytes Absolute: 1.2 10*3/uL — ABNORMAL HIGH (ref 0.1–1.0)
Monocytes Relative: 14 %
Neutro Abs: 4.4 10*3/uL (ref 1.7–7.7)
Neutrophils Relative %: 55 %
Platelet Count: 240 10*3/uL (ref 150–400)
RBC: 3.86 MIL/uL — ABNORMAL LOW (ref 3.87–5.11)
RDW: 15.4 % (ref 11.5–15.5)
WBC Count: 8 10*3/uL (ref 4.0–10.5)
nRBC: 0 % (ref 0.0–0.2)

## 2022-06-12 LAB — CMP (CANCER CENTER ONLY)
ALT: 10 U/L (ref 0–44)
AST: 19 U/L (ref 15–41)
Albumin: 3.9 g/dL (ref 3.5–5.0)
Alkaline Phosphatase: 69 U/L (ref 38–126)
Anion gap: 8 (ref 5–15)
BUN: 16 mg/dL (ref 8–23)
CO2: 25 mmol/L (ref 22–32)
Calcium: 8.8 mg/dL — ABNORMAL LOW (ref 8.9–10.3)
Chloride: 106 mmol/L (ref 98–111)
Creatinine: 0.81 mg/dL (ref 0.44–1.00)
GFR, Estimated: >60 mL/min (ref >60)
Glucose, Bld: 89 mg/dL (ref 70–99)
Potassium: 4.1 mmol/L (ref 3.5–5.1)
Sodium: 139 mmol/L (ref 135–145)
Total Bilirubin: 0.4 mg/dL (ref 0.3–1.2)
Total Protein: 7 g/dL (ref 6.5–8.1)

## 2022-07-09 ENCOUNTER — Inpatient Hospital Stay: Payer: Medicare Other

## 2022-07-09 ENCOUNTER — Inpatient Hospital Stay (HOSPITAL_BASED_OUTPATIENT_CLINIC_OR_DEPARTMENT_OTHER): Payer: Medicare Other | Admitting: Family

## 2022-07-09 ENCOUNTER — Encounter: Payer: Self-pay | Admitting: Family

## 2022-07-09 ENCOUNTER — Inpatient Hospital Stay: Payer: Medicare Other | Attending: Hematology & Oncology

## 2022-07-09 VITALS — BP 139/60 | HR 84 | Temp 99.0°F | Resp 18 | Wt 136.0 lb

## 2022-07-09 DIAGNOSIS — E538 Deficiency of other specified B group vitamins: Secondary | ICD-10-CM

## 2022-07-09 DIAGNOSIS — N1831 Chronic kidney disease, stage 3a: Secondary | ICD-10-CM | POA: Insufficient documentation

## 2022-07-09 DIAGNOSIS — D472 Monoclonal gammopathy: Secondary | ICD-10-CM

## 2022-07-09 DIAGNOSIS — D500 Iron deficiency anemia secondary to blood loss (chronic): Secondary | ICD-10-CM

## 2022-07-09 DIAGNOSIS — D631 Anemia in chronic kidney disease: Secondary | ICD-10-CM | POA: Insufficient documentation

## 2022-07-09 LAB — VITAMIN B12: Vitamin B-12: 153 pg/mL — ABNORMAL LOW (ref 180–914)

## 2022-07-09 LAB — CMP (CANCER CENTER ONLY)
ALT: 10 U/L (ref 0–44)
AST: 20 U/L (ref 15–41)
Albumin: 4 g/dL (ref 3.5–5.0)
Alkaline Phosphatase: 72 U/L (ref 38–126)
Anion gap: 10 (ref 5–15)
BUN: 19 mg/dL (ref 8–23)
CO2: 24 mmol/L (ref 22–32)
Calcium: 8.5 mg/dL — ABNORMAL LOW (ref 8.9–10.3)
Chloride: 104 mmol/L (ref 98–111)
Creatinine: 0.81 mg/dL (ref 0.44–1.00)
GFR, Estimated: >60 mL/min (ref >60)
Glucose, Bld: 84 mg/dL (ref 70–99)
Potassium: 3.9 mmol/L (ref 3.5–5.1)
Sodium: 138 mmol/L (ref 135–145)
Total Bilirubin: 0.5 mg/dL (ref 0.3–1.2)
Total Protein: 7.2 g/dL (ref 6.5–8.1)

## 2022-07-09 LAB — RETICULOCYTES
Immature Retic Fract: 11.3 % (ref 2.3–15.9)
RBC.: 3.45 MIL/uL — ABNORMAL LOW (ref 3.87–5.11)
Retic Count, Absolute: 55.5 10*3/uL (ref 19.0–186.0)
Retic Ct Pct: 1.6 % (ref 0.4–3.1)

## 2022-07-09 LAB — CBC WITH DIFFERENTIAL (CANCER CENTER ONLY)
Abs Immature Granulocytes: 0.02 10*3/uL (ref 0.00–0.07)
Basophils Absolute: 0 10*3/uL (ref 0.0–0.1)
Basophils Relative: 0 %
Eosinophils Absolute: 0.4 10*3/uL (ref 0.0–0.5)
Eosinophils Relative: 5 %
HCT: 31.7 % — ABNORMAL LOW (ref 36.0–46.0)
Hemoglobin: 10.6 g/dL — ABNORMAL LOW (ref 12.0–15.0)
Immature Granulocytes: 0 %
Lymphocytes Relative: 28 %
Lymphs Abs: 2.2 10*3/uL (ref 0.7–4.0)
MCH: 30.5 pg (ref 26.0–34.0)
MCHC: 33.4 g/dL (ref 30.0–36.0)
MCV: 91.4 fL (ref 80.0–100.0)
Monocytes Absolute: 1.3 10*3/uL — ABNORMAL HIGH (ref 0.1–1.0)
Monocytes Relative: 17 %
Neutro Abs: 3.8 10*3/uL (ref 1.7–7.7)
Neutrophils Relative %: 50 %
Platelet Count: 257 10*3/uL (ref 150–400)
RBC: 3.47 MIL/uL — ABNORMAL LOW (ref 3.87–5.11)
RDW: 14.7 % (ref 11.5–15.5)
WBC Count: 7.8 10*3/uL (ref 4.0–10.5)
nRBC: 0 % (ref 0.0–0.2)

## 2022-07-09 LAB — FERRITIN: Ferritin: 414 ng/mL — ABNORMAL HIGH (ref 11–307)

## 2022-07-09 MED ORDER — DARBEPOETIN ALFA 300 MCG/0.6ML IJ SOSY
300.0000 ug | PREFILLED_SYRINGE | Freq: Once | INTRAMUSCULAR | Status: AC
  Administered 2022-07-09: 300 ug via SUBCUTANEOUS
  Filled 2022-07-09: qty 0.6

## 2022-07-09 NOTE — Progress Notes (Signed)
Hematology and Oncology Follow Up Visit  Mallory Murphy [id number] Jan 23, 1946 77 y.o. 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 She is doing well and has no complaints at this time.  No blood loss noted. No abnormal bruising, no petechiae.  No fever, chills, n/v, cough, rash, dizziness, SOB, chest pain, palpitations, abdominal pain or changes in bowel or bladder habits.  No swelling or tenderness in her extremities.  No falls or syncope reported. She ambulates with her Rolator for added support.  Appetite [place] good but she admits that she needs to better hydrate throughout the day. Weight is stable at 136 lbs.   ECOG Performance Status: 1 - Symptomatic but completely ambulatory  Medications:  Allergies as of 07/09/2022       Reactions   Macrobid [nitrofurantoin Macrocrystal] Nausea Only        Medication List        Accurate as of July 09, 2022  2:40 PM. If you have any questions, ask your nurse or doctor.          apixaban 2.5 MG Tabs tablet Commonly known as: Eliquis Take 1 tablet (2.5 mg total) by mouth 2 (two) times daily.   ARANESP IJ Inject 300 mcg into the skin See admin instructions. 300 mcg into the skin every 3 months for Hgb <11   diphenhydrAMINE 25 MG tablet Commonly known as: BENADRYL Take 25 mg by mouth at bedtime.   escitalopram 10 MG tablet Commonly known as: LEXAPRO Take 10 mg by mouth at bedtime.   leflunomide 20 MG tablet Commonly known as: ARAVA Take 20 mg by mouth at bedtime.   losartan 50 MG tablet Commonly known as: COZAAR Take 50 mg by mouth at bedtime.   mirtazapine 15 MG tablet Commonly known as: REMERON Take 15 mg by mouth at bedtime.   oxybutynin 5 MG tablet Commonly known as:  DITROPAN Take 5 mg by mouth at bedtime.   oxyCODONE-acetaminophen 5-325 MG tablet Commonly known as: PERCOCET/ROXICET Take 1 tablet by mouth every 4 (four) hours as needed for moderate pain.   Simponi Aria 50 MG/4ML Soln injection Generic drug: golimumab Inject into the vein every 8 (eight) weeks.   Vitamin D3 1.25 MG (50000 UT) capsule Take 1 capsule by mouth once a week.        Allergies:  Allergies  Allergen Reactions   Macrobid [Nitrofurantoin Macrocrystal] Nausea Only    Past Medical History, Surgical history, Social history, and Family History were reviewed and updated.  Review of Systems: All other 10 point review of systems is negative.   Physical Exam:  vitals were not taken for this visit.   Wt Readings from Last 3 Encounters:  03/12/22 136 lb 12.8 oz (62.1 kg)  02/01/22 136 lb (61.7 kg)  11/19/21 139 lb (63 kg)    Ocular: Sclerae unicteric, pupils equal, round and reactive to light Ear-nose-throat: Oropharynx clear, dentition fair Lymphatic: No cervical or supraclavicular adenopathy Lungs no rales or rhonchi, good excursion bilaterally Heart regular rate and rhythm, no murmur appreciated Abd soft, nontender, positive bowel sounds MSK no focal spinal tenderness, no joint edema Neuro: non-focal, well-oriented, appropriate affect Breasts: Deferred   Lab Results  Component Value Date  WBC 8.0 06/12/2022   HGB 11.6 (L) 06/12/2022   HCT 34.4 (L) 06/12/2022   MCV 89.1 06/12/2022   PLT 240 06/12/2022   Lab Results  Component Value Date   FERRITIN 575 (H) 03/12/2022   IRON 31 03/12/2022   TIBC 200 (L) 03/12/2022   UIBC 169 03/12/2022   IRONPCTSAT 16 03/12/2022   Lab Results  Component Value Date   RETICCTPCT 1.5 03/12/2022   RBC 3.86 (L) 06/12/2022   Lab Results  Component Value Date   KPAFRELGTCHN 63.2 (H) 03/12/2022   LAMBDASER 40.6 (H) 03/12/2022   KAPLAMBRATIO 1.56 03/12/2022   Lab Results  Component Value Date   IGGSERUM 1,706 (H)  03/12/2022   IGA 407 03/12/2022   IGMSERUM 102 03/12/2022   Lab Results  Component Value Date   TOTALPROTELP 7.1 03/12/2022   ALBUMINELP 3.3 03/12/2022   A1GS 0.3 03/12/2022   A2GS 0.9 03/12/2022   BETS 1.0 03/12/2022   GAMS 1.7 03/12/2022   MSPIKE 0.7 (H) 03/12/2022   SPEI Comment 03/12/2022     Chemistry      Component Value Date/Time   NA 139 06/12/2022 1441   K 4.1 06/12/2022 1441   CL 106 06/12/2022 1441   CO2 25 06/12/2022 1441   BUN 16 06/12/2022 1441   CREATININE 0.81 06/12/2022 1441   CREATININE 1.19 (H) 05/07/2012 1553      Component Value Date/Time   CALCIUM 8.8 (L) 06/12/2022 1441   ALKPHOS 69 06/12/2022 1441   AST 19 06/12/2022 1441   ALT 10 06/12/2022 1441   BILITOT 0.4 06/12/2022 1441       Impression and Plan: Mallory Murphy is a very pleasant 77 yo caucasian female with multifactorial anemia as well as IgG kappa MGUS.  Protein studies are pending.  ESA given, Hgb 10.6.  Iron studies pending.  Lab and injection monthly.  Follow-up in 4 months.   Lottie Dawson, NP 3/27/20242:40 PM

## 2022-07-09 NOTE — Patient Instructions (Signed)

## 2022-07-10 LAB — KAPPA/LAMBDA LIGHT CHAINS
Kappa free light chain: 39.5 mg/L — ABNORMAL HIGH (ref 3.3–19.4)
Kappa, lambda light chain ratio: 1.37 (ref 0.26–1.65)
Lambda free light chains: 28.8 mg/L — ABNORMAL HIGH (ref 5.7–26.3)

## 2022-07-10 LAB — IRON AND IRON BINDING CAPACITY (CC-WL,HP ONLY)
Iron: 47 ug/dL (ref 28–170)
Saturation Ratios: 22 % (ref 10.4–31.8)
TIBC: 210 ug/dL — ABNORMAL LOW (ref 250–450)
UIBC: 163 ug/dL (ref 148–442)

## 2022-07-11 ENCOUNTER — Other Ambulatory Visit: Payer: Medicare Other

## 2022-07-11 ENCOUNTER — Ambulatory Visit: Payer: Medicare Other

## 2022-07-11 ENCOUNTER — Ambulatory Visit: Payer: Medicare Other | Admitting: Family

## 2022-07-11 LAB — IGG, IGA, IGM
IgA: 332 mg/dL (ref 64–422)
IgG (Immunoglobin G), Serum: 1583 mg/dL (ref 586–1602)
IgM (Immunoglobulin M), Srm: 92 mg/dL (ref 26–217)

## 2022-07-11 LAB — PROTEIN ELECTROPHORESIS, SERUM
A/G Ratio: 1 (ref 0.7–1.7)
Albumin ELP: 3.4 g/dL (ref 2.9–4.4)
Alpha-1-Globulin: 0.3 g/dL (ref 0.0–0.4)
Alpha-2-Globulin: 0.7 g/dL (ref 0.4–1.0)
Beta Globulin: 0.9 g/dL (ref 0.7–1.3)
Gamma Globulin: 1.5 g/dL (ref 0.4–1.8)
Globulin, Total: 3.4 g/dL (ref 2.2–3.9)
M-Spike, %: 0.7 g/dL — ABNORMAL HIGH
Total Protein ELP: 6.8 g/dL (ref 6.0–8.5)

## 2022-08-06 ENCOUNTER — Inpatient Hospital Stay: Payer: Medicare Other | Attending: Hematology & Oncology

## 2022-08-06 ENCOUNTER — Inpatient Hospital Stay: Payer: Medicare Other

## 2022-08-06 VITALS — BP 144/81 | HR 88 | Temp 98.4°F | Resp 18

## 2022-08-06 DIAGNOSIS — D631 Anemia in chronic kidney disease: Secondary | ICD-10-CM | POA: Insufficient documentation

## 2022-08-06 DIAGNOSIS — D500 Iron deficiency anemia secondary to blood loss (chronic): Secondary | ICD-10-CM

## 2022-08-06 DIAGNOSIS — E538 Deficiency of other specified B group vitamins: Secondary | ICD-10-CM

## 2022-08-06 DIAGNOSIS — D509 Iron deficiency anemia, unspecified: Secondary | ICD-10-CM | POA: Diagnosis not present

## 2022-08-06 DIAGNOSIS — N189 Chronic kidney disease, unspecified: Secondary | ICD-10-CM | POA: Insufficient documentation

## 2022-08-06 DIAGNOSIS — D472 Monoclonal gammopathy: Secondary | ICD-10-CM | POA: Insufficient documentation

## 2022-08-06 LAB — CBC WITH DIFFERENTIAL (CANCER CENTER ONLY)
Abs Immature Granulocytes: 0.01 10*3/uL (ref 0.00–0.07)
Basophils Absolute: 0.1 10*3/uL (ref 0.0–0.1)
Basophils Relative: 1 %
Eosinophils Absolute: 0.4 10*3/uL (ref 0.0–0.5)
Eosinophils Relative: 5 %
HCT: 38.8 % (ref 36.0–46.0)
Hemoglobin: 12.8 g/dL (ref 12.0–15.0)
Immature Granulocytes: 0 %
Lymphocytes Relative: 32 %
Lymphs Abs: 2.5 10*3/uL (ref 0.7–4.0)
MCH: 31.4 pg (ref 26.0–34.0)
MCHC: 33 g/dL (ref 30.0–36.0)
MCV: 95.1 fL (ref 80.0–100.0)
Monocytes Absolute: 1 10*3/uL (ref 0.1–1.0)
Monocytes Relative: 13 %
Neutro Abs: 3.9 10*3/uL (ref 1.7–7.7)
Neutrophils Relative %: 49 %
Platelet Count: 215 10*3/uL (ref 150–400)
RBC: 4.08 MIL/uL (ref 3.87–5.11)
RDW: 14.4 % (ref 11.5–15.5)
WBC Count: 7.9 10*3/uL (ref 4.0–10.5)
nRBC: 0 % (ref 0.0–0.2)

## 2022-08-06 LAB — RETICULOCYTES
Immature Retic Fract: 6.6 % (ref 2.3–15.9)
RBC.: 4.08 MIL/uL (ref 3.87–5.11)
Retic Count, Absolute: 38.8 10*3/uL (ref 19.0–186.0)
Retic Ct Pct: 1 % (ref 0.4–3.1)

## 2022-08-06 LAB — CMP (CANCER CENTER ONLY)
ALT: 7 U/L (ref 0–44)
AST: 18 U/L (ref 15–41)
Albumin: 3.6 g/dL (ref 3.5–5.0)
Alkaline Phosphatase: 59 U/L (ref 38–126)
Anion gap: 4 — ABNORMAL LOW (ref 5–15)
BUN: 22 mg/dL (ref 8–23)
CO2: 25 mmol/L (ref 22–32)
Calcium: 8.9 mg/dL (ref 8.9–10.3)
Chloride: 109 mmol/L (ref 98–111)
Creatinine: 0.92 mg/dL (ref 0.44–1.00)
GFR, Estimated: >60 mL/min (ref >60)
Glucose, Bld: 116 mg/dL — ABNORMAL HIGH (ref 70–99)
Potassium: 3.8 mmol/L (ref 3.5–5.1)
Sodium: 138 mmol/L (ref 135–145)
Total Bilirubin: 0.2 mg/dL — ABNORMAL LOW (ref 0.3–1.2)
Total Protein: 6.9 g/dL (ref 6.5–8.1)

## 2022-08-06 LAB — FERRITIN: Ferritin: 189 ng/mL (ref 11–307)

## 2022-08-06 MED ORDER — DARBEPOETIN ALFA 300 MCG/0.6ML IJ SOSY: 300.0000 ug | PREFILLED_SYRINGE | Freq: Once | INTRAMUSCULAR | Status: DC

## 2022-08-06 NOTE — Patient Instructions (Signed)

## 2022-08-07 LAB — IRON AND IRON BINDING CAPACITY (CC-WL,HP ONLY)
Iron: 58 ug/dL (ref 28–170)
Saturation Ratios: 27 % (ref 10.4–31.8)
TIBC: 211 ug/dL — ABNORMAL LOW (ref 250–450)
UIBC: 153 ug/dL (ref 148–442)

## 2022-08-08 ENCOUNTER — Other Ambulatory Visit: Payer: Medicare Other

## 2022-08-08 ENCOUNTER — Ambulatory Visit: Payer: Medicare Other

## 2022-08-11 ENCOUNTER — Ambulatory Visit: Payer: Medicare Other

## 2022-08-11 ENCOUNTER — Other Ambulatory Visit: Payer: Medicare Other

## 2022-09-05 ENCOUNTER — Ambulatory Visit: Payer: Medicare Other

## 2022-09-05 ENCOUNTER — Other Ambulatory Visit: Payer: Medicare Other

## 2022-09-09 ENCOUNTER — Other Ambulatory Visit: Payer: Medicare Other

## 2022-09-09 ENCOUNTER — Ambulatory Visit: Payer: Medicare Other

## 2022-09-10 ENCOUNTER — Inpatient Hospital Stay: Payer: Medicare Other | Attending: Hematology & Oncology

## 2022-09-10 ENCOUNTER — Inpatient Hospital Stay: Payer: Medicare Other

## 2022-09-10 DIAGNOSIS — D631 Anemia in chronic kidney disease: Secondary | ICD-10-CM

## 2022-09-10 DIAGNOSIS — D649 Anemia, unspecified: Secondary | ICD-10-CM | POA: Insufficient documentation

## 2022-09-10 DIAGNOSIS — D472 Monoclonal gammopathy: Secondary | ICD-10-CM

## 2022-09-10 DIAGNOSIS — E538 Deficiency of other specified B group vitamins: Secondary | ICD-10-CM

## 2022-09-10 DIAGNOSIS — D500 Iron deficiency anemia secondary to blood loss (chronic): Secondary | ICD-10-CM

## 2022-09-10 LAB — CBC WITH DIFFERENTIAL (CANCER CENTER ONLY)
Abs Immature Granulocytes: 0.02 10*3/uL (ref 0.00–0.07)
Basophils Absolute: 0 10*3/uL (ref 0.0–0.1)
Basophils Relative: 0 %
Eosinophils Absolute: 0.3 10*3/uL (ref 0.0–0.5)
Eosinophils Relative: 4 %
HCT: 37.6 % (ref 36.0–46.0)
Hemoglobin: 12.6 g/dL (ref 12.0–15.0)
Immature Granulocytes: 0 %
Lymphocytes Relative: 29 %
Lymphs Abs: 2.3 10*3/uL (ref 0.7–4.0)
MCH: 30.6 pg (ref 26.0–34.0)
MCHC: 33.5 g/dL (ref 30.0–36.0)
MCV: 91.3 fL (ref 80.0–100.0)
Monocytes Absolute: 1 10*3/uL (ref 0.1–1.0)
Monocytes Relative: 13 %
Neutro Abs: 4.3 10*3/uL (ref 1.7–7.7)
Neutrophils Relative %: 54 %
Platelet Count: 248 10*3/uL (ref 150–400)
RBC: 4.12 MIL/uL (ref 3.87–5.11)
RDW: 13.2 % (ref 11.5–15.5)
WBC Count: 8 10*3/uL (ref 4.0–10.5)
nRBC: 0 % (ref 0.0–0.2)

## 2022-09-10 LAB — CMP (CANCER CENTER ONLY)
ALT: 11 U/L (ref 0–44)
AST: 20 U/L (ref 15–41)
Albumin: 4 g/dL (ref 3.5–5.0)
Alkaline Phosphatase: 69 U/L (ref 38–126)
Anion gap: 8 (ref 5–15)
BUN: 22 mg/dL (ref 8–23)
CO2: 25 mmol/L (ref 22–32)
Calcium: 9.7 mg/dL (ref 8.9–10.3)
Chloride: 102 mmol/L (ref 98–111)
Creatinine: 0.92 mg/dL (ref 0.44–1.00)
GFR, Estimated: >60 mL/min (ref >60)
Glucose, Bld: 94 mg/dL (ref 70–99)
Potassium: 3.9 mmol/L (ref 3.5–5.1)
Sodium: 135 mmol/L (ref 135–145)
Total Bilirubin: 0.3 mg/dL (ref 0.3–1.2)
Total Protein: 7 g/dL (ref 6.5–8.1)

## 2022-09-10 LAB — FERRITIN: Ferritin: 233 ng/mL (ref 11–307)

## 2022-09-11 LAB — IRON AND IRON BINDING CAPACITY (CC-WL,HP ONLY)
Iron: 50 ug/dL (ref 28–170)
Saturation Ratios: 24 % (ref 10.4–31.8)
TIBC: 213 ug/dL — ABNORMAL LOW (ref 250–450)
UIBC: 163 ug/dL (ref 148–442)

## 2022-10-07 ENCOUNTER — Inpatient Hospital Stay: Payer: Medicare Other

## 2022-10-07 ENCOUNTER — Inpatient Hospital Stay: Payer: Medicare Other | Attending: Hematology & Oncology

## 2022-10-07 DIAGNOSIS — D472 Monoclonal gammopathy: Secondary | ICD-10-CM

## 2022-10-07 DIAGNOSIS — D500 Iron deficiency anemia secondary to blood loss (chronic): Secondary | ICD-10-CM

## 2022-10-07 DIAGNOSIS — D631 Anemia in chronic kidney disease: Secondary | ICD-10-CM

## 2022-10-07 DIAGNOSIS — E538 Deficiency of other specified B group vitamins: Secondary | ICD-10-CM

## 2022-10-07 DIAGNOSIS — D509 Iron deficiency anemia, unspecified: Secondary | ICD-10-CM | POA: Insufficient documentation

## 2022-10-07 NOTE — Progress Notes (Signed)
Pt hemoglobin 12.5 and her injection is not indicated. Pt given copy of her labs and states she is feeling great. Pt excited she doesn't need a shot. Pt aware of next appointment date and time. No further needs identified.

## 2022-10-08 DIAGNOSIS — D509 Iron deficiency anemia, unspecified: Secondary | ICD-10-CM | POA: Diagnosis present

## 2022-10-08 LAB — CBC WITH DIFFERENTIAL (CANCER CENTER ONLY)
Abs Immature Granulocytes: 0.02 10*3/uL (ref 0.00–0.07)
Basophils Absolute: 0 10*3/uL (ref 0.0–0.1)
Basophils Relative: 0 %
Eosinophils Absolute: 0.4 10*3/uL (ref 0.0–0.5)
Eosinophils Relative: 5 %
HCT: 37.2 % (ref 36.0–46.0)
Hemoglobin: 12.5 g/dL (ref 12.0–15.0)
Immature Granulocytes: 0 %
Lymphocytes Relative: 31 %
Lymphs Abs: 2.6 10*3/uL (ref 0.7–4.0)
MCH: 30.6 pg (ref 26.0–34.0)
MCHC: 33.6 g/dL (ref 30.0–36.0)
MCV: 91 fL (ref 80.0–100.0)
Monocytes Absolute: 0.7 10*3/uL (ref 0.1–1.0)
Monocytes Relative: 9 %
Neutro Abs: 4.5 10*3/uL (ref 1.7–7.7)
Neutrophils Relative %: 55 %
Platelet Count: 267 10*3/uL (ref 150–400)
RBC: 4.09 MIL/uL (ref 3.87–5.11)
RDW: 13.5 % (ref 11.5–15.5)
WBC Count: 8.2 10*3/uL (ref 4.0–10.5)
nRBC: 0 % (ref 0.0–0.2)

## 2022-10-08 LAB — CMP (CANCER CENTER ONLY)
ALT: 10 U/L (ref 0–44)
AST: 22 U/L (ref 15–41)
Albumin: 3.9 g/dL (ref 3.5–5.0)
Alkaline Phosphatase: 69 U/L (ref 38–126)
Anion gap: 8 (ref 5–15)
BUN: 23 mg/dL (ref 8–23)
CO2: 26 mmol/L (ref 22–32)
Calcium: 9.5 mg/dL (ref 8.9–10.3)
Chloride: 104 mmol/L (ref 98–111)
Creatinine: 1 mg/dL (ref 0.44–1.00)
GFR, Estimated: 58 mL/min — ABNORMAL LOW (ref >60)
Glucose, Bld: 126 mg/dL — ABNORMAL HIGH (ref 70–99)
Potassium: 3.9 mmol/L (ref 3.5–5.1)
Sodium: 138 mmol/L (ref 135–145)
Total Bilirubin: 0.3 mg/dL (ref 0.3–1.2)
Total Protein: 7.3 g/dL (ref 6.5–8.1)

## 2022-10-08 LAB — FERRITIN: Ferritin: 221 ng/mL (ref 11–307)

## 2022-10-08 LAB — RETICULOCYTES
Immature Retic Fract: 7.8 % (ref 2.3–15.9)
RBC.: 4.09 MIL/uL (ref 3.87–5.11)
Retic Count, Absolute: 40.1 10*3/uL (ref 19.0–186.0)
Retic Ct Pct: 1 % (ref 0.4–3.1)

## 2022-10-08 LAB — IRON AND IRON BINDING CAPACITY (CC-WL,HP ONLY)
Iron: 70 ug/dL (ref 28–170)
Saturation Ratios: 33 % — ABNORMAL HIGH (ref 10.4–31.8)
TIBC: 214 ug/dL — ABNORMAL LOW (ref 250–450)
UIBC: 144 ug/dL — ABNORMAL LOW (ref 148–442)

## 2022-10-10 ENCOUNTER — Other Ambulatory Visit: Payer: Medicare Other

## 2022-10-10 ENCOUNTER — Ambulatory Visit: Payer: Medicare Other

## 2022-10-13 ENCOUNTER — Ambulatory Visit: Payer: Medicare Other

## 2022-10-13 ENCOUNTER — Other Ambulatory Visit: Payer: Medicare Other

## 2022-11-05 ENCOUNTER — Other Ambulatory Visit: Payer: Self-pay | Admitting: Internal Medicine

## 2022-11-05 DIAGNOSIS — Z13820 Encounter for screening for osteoporosis: Secondary | ICD-10-CM

## 2022-11-11 ENCOUNTER — Ambulatory Visit: Payer: Medicare Other

## 2022-11-11 ENCOUNTER — Other Ambulatory Visit: Payer: Medicare Other

## 2022-11-11 ENCOUNTER — Ambulatory Visit: Payer: Medicare Other | Admitting: Family

## 2022-11-18 ENCOUNTER — Encounter: Payer: Self-pay | Admitting: Family

## 2022-11-18 ENCOUNTER — Inpatient Hospital Stay: Payer: Medicare Other | Admitting: Family

## 2022-11-18 ENCOUNTER — Inpatient Hospital Stay: Payer: Medicare Other

## 2022-11-18 ENCOUNTER — Inpatient Hospital Stay: Payer: Medicare Other | Attending: Hematology & Oncology

## 2022-11-18 VITALS — BP 127/65 | HR 89 | Temp 98.3°F | Resp 17 | Wt 136.8 lb

## 2022-11-18 DIAGNOSIS — M069 Rheumatoid arthritis, unspecified: Secondary | ICD-10-CM | POA: Diagnosis not present

## 2022-11-18 DIAGNOSIS — D509 Iron deficiency anemia, unspecified: Secondary | ICD-10-CM | POA: Insufficient documentation

## 2022-11-18 DIAGNOSIS — N1831 Chronic kidney disease, stage 3a: Secondary | ICD-10-CM | POA: Insufficient documentation

## 2022-11-18 DIAGNOSIS — E611 Iron deficiency: Secondary | ICD-10-CM | POA: Insufficient documentation

## 2022-11-18 DIAGNOSIS — D631 Anemia in chronic kidney disease: Secondary | ICD-10-CM

## 2022-11-18 DIAGNOSIS — E538 Deficiency of other specified B group vitamins: Secondary | ICD-10-CM

## 2022-11-18 DIAGNOSIS — D500 Iron deficiency anemia secondary to blood loss (chronic): Secondary | ICD-10-CM

## 2022-11-18 DIAGNOSIS — D472 Monoclonal gammopathy: Secondary | ICD-10-CM | POA: Diagnosis not present

## 2022-11-18 LAB — RETICULOCYTES
Immature Retic Fract: 12.3 % (ref 2.3–15.9)
RBC.: 3.52 MIL/uL — ABNORMAL LOW (ref 3.87–5.11)
Retic Count, Absolute: 55.6 10*3/uL (ref 19.0–186.0)
Retic Ct Pct: 1.6 % (ref 0.4–3.1)

## 2022-11-18 LAB — CBC WITH DIFFERENTIAL (CANCER CENTER ONLY)
Abs Immature Granulocytes: 0.02 10*3/uL (ref 0.00–0.07)
Basophils Absolute: 0 10*3/uL (ref 0.0–0.1)
Basophils Relative: 0 %
Eosinophils Absolute: 0.4 10*3/uL (ref 0.0–0.5)
Eosinophils Relative: 4 %
HCT: 32.6 % — ABNORMAL LOW (ref 36.0–46.0)
Hemoglobin: 10.9 g/dL — ABNORMAL LOW (ref 12.0–15.0)
Immature Granulocytes: 0 %
Lymphocytes Relative: 31 %
Lymphs Abs: 2.9 10*3/uL (ref 0.7–4.0)
MCH: 31.1 pg (ref 26.0–34.0)
MCHC: 33.4 g/dL (ref 30.0–36.0)
MCV: 93.1 fL (ref 80.0–100.0)
Monocytes Absolute: 0.9 10*3/uL (ref 0.1–1.0)
Monocytes Relative: 10 %
Neutro Abs: 5 10*3/uL (ref 1.7–7.7)
Neutrophils Relative %: 55 %
Platelet Count: 303 10*3/uL (ref 150–400)
RBC: 3.5 MIL/uL — ABNORMAL LOW (ref 3.87–5.11)
RDW: 14.6 % (ref 11.5–15.5)
WBC Count: 9.2 10*3/uL (ref 4.0–10.5)
nRBC: 0 % (ref 0.0–0.2)

## 2022-11-18 LAB — CMP (CANCER CENTER ONLY)
ALT: 12 U/L (ref 0–44)
AST: 21 U/L (ref 15–41)
Albumin: 3.5 g/dL (ref 3.5–5.0)
Alkaline Phosphatase: 66 U/L (ref 38–126)
Anion gap: 8 (ref 5–15)
BUN: 25 mg/dL — ABNORMAL HIGH (ref 8–23)
CO2: 24 mmol/L (ref 22–32)
Calcium: 8.5 mg/dL — ABNORMAL LOW (ref 8.9–10.3)
Chloride: 103 mmol/L (ref 98–111)
Creatinine: 0.91 mg/dL (ref 0.44–1.00)
GFR, Estimated: >60 mL/min (ref >60)
Glucose, Bld: 87 mg/dL (ref 70–99)
Potassium: 4 mmol/L (ref 3.5–5.1)
Sodium: 135 mmol/L (ref 135–145)
Total Bilirubin: 0.2 mg/dL — ABNORMAL LOW (ref 0.3–1.2)
Total Protein: 7.4 g/dL (ref 6.5–8.1)

## 2022-11-18 LAB — FERRITIN: Ferritin: 235 ng/mL (ref 11–307)

## 2022-11-18 LAB — VITAMIN B12: Vitamin B-12: 144 pg/mL — ABNORMAL LOW (ref 180–914)

## 2022-11-18 MED ORDER — DARBEPOETIN ALFA 300 MCG/0.6ML IJ SOSY
300.0000 ug | PREFILLED_SYRINGE | Freq: Once | INTRAMUSCULAR | Status: AC
  Administered 2022-11-18: 300 ug via SUBCUTANEOUS
  Filled 2022-11-18: qty 0.6

## 2022-11-18 NOTE — Progress Notes (Signed)
Hematology and Oncology Follow Up Visit  Mallory Murphy [id number] 08-May-1945 77 y.o. 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 Hgb this visit is 10.9.  She is doing well and has no complaints at this time.  No blood loss, bruising or petechiae.  She denies fever, chills, n/v, cough, rash, dizziness, SOB, chest pain, palpitations, abdominal pain or changes in bowel or bladder habits.  No swelling, tenderness, numbness or tingling in her extremities at this time.  No falls or syncope.  She ambulates with her Rolator for added support.  Appetite and hydration are good. Weight is stable at 136 lbs.   ECOG Performance Status: 1 - Symptomatic but completely ambulatory  Medications:  Allergies as of 11/18/2022       Reactions   Macrobid [nitrofurantoin Macrocrystal] Nausea Only        Medication List        Accurate as of November 18, 2022  2:52 PM. If you have any questions, ask your nurse or doctor.          apixaban 2.5 MG Tabs tablet Commonly known as: Eliquis Take 1 tablet (2.5 mg total) by mouth 2 (two) times daily.   ARANESP IJ Inject 300 mcg into the skin See admin instructions. 300 mcg into the skin every 3 months for Hgb <11   diphenhydrAMINE 25 MG tablet Commonly known as: BENADRYL Take 25 mg by mouth at bedtime.   escitalopram 10 MG tablet Commonly known as: LEXAPRO Take 10 mg by mouth at bedtime.   leflunomide 20 MG tablet Commonly known as: ARAVA Take 20 mg by mouth at bedtime.   losartan 50 MG tablet Commonly known as: COZAAR Take 50 mg by mouth at bedtime.   mirtazapine 15 MG tablet Commonly known as: REMERON Take 15 mg by mouth at bedtime.   oxybutynin 5 MG tablet Commonly known as:  DITROPAN Take 5 mg by mouth at bedtime.   oxyCODONE-acetaminophen 5-325 MG tablet Commonly known as: PERCOCET/ROXICET Take 1 tablet by mouth every 4 (four) hours as needed for moderate pain.   Simponi Aria 50 MG/4ML Soln injection Generic drug: golimumab Inject into the vein every 8 (eight) weeks.   Vitamin D3 1.25 MG (50000 UT) Caps Take 1 capsule by mouth once a week.        Allergies:  Allergies  Allergen Reactions   Macrobid [Nitrofurantoin Macrocrystal] Nausea Only    Past Medical History, Surgical history, Social history, and Family History were reviewed and updated.  Review of Systems: All other 10 point review of systems is negative.   Physical Exam:  vitals were not taken for this visit.   Wt Readings from Last 3 Encounters:  07/09/22 136 lb (61.7 kg)  03/12/22 136 lb 12.8 oz (62.1 kg)  02/01/22 136 lb (61.7 kg)    Ocular: Sclerae unicteric, pupils equal, round and reactive to light Ear-nose-throat: Oropharynx clear, dentition fair Lymphatic: No cervical or supraclavicular adenopathy Lungs no rales or rhonchi, good excursion bilaterally Heart regular rate and rhythm, no murmur appreciated Abd soft, nontender, positive bowel sounds MSK no focal spinal tenderness, no joint edema Neuro: non-focal, well-oriented, appropriate affect Breasts: Deferred   Lab Results  Component Value Date  WBC 9.2 11/18/2022   HGB 10.9 (L) 11/18/2022   HCT 32.6 (L) 11/18/2022   MCV 93.1 11/18/2022   PLT 303 11/18/2022   Lab Results  Component Value Date   FERRITIN 221 10/08/2022   IRON 70 10/08/2022   TIBC 214 (L) 10/08/2022   UIBC 144 (L) 10/08/2022   IRONPCTSAT 33 (H) 10/08/2022   Lab Results  Component Value Date   RETICCTPCT 1.6 11/18/2022   RBC 3.52 (L) 11/18/2022   Lab Results  Component Value Date   KPAFRELGTCHN 39.5 (H) 07/09/2022   LAMBDASER 28.8 (H) 07/09/2022   KAPLAMBRATIO 1.37 07/09/2022   Lab Results  Component Value Date   IGGSERUM 1,583  07/09/2022   IGA 332 07/09/2022   IGMSERUM 92 07/09/2022   Lab Results  Component Value Date   TOTALPROTELP 6.8 07/09/2022   ALBUMINELP 3.4 07/09/2022   A1GS 0.3 07/09/2022   A2GS 0.7 07/09/2022   BETS 0.9 07/09/2022   GAMS 1.5 07/09/2022   MSPIKE 0.7 (H) 07/09/2022   SPEI Comment 07/09/2022     Chemistry      Component Value Date/Time   NA 138 10/08/2022 0845   K 3.9 10/08/2022 0845   CL 104 10/08/2022 0845   CO2 26 10/08/2022 0845   BUN 23 10/08/2022 0845   CREATININE 1.00 10/08/2022 0845   CREATININE 1.19 (H) 05/07/2012 1553      Component Value Date/Time   CALCIUM 9.5 10/08/2022 0845   ALKPHOS 69 10/08/2022 0845   AST 22 10/08/2022 0845   ALT 10 10/08/2022 0845   BILITOT 0.3 10/08/2022 0845       Impression and Plan: Mallory Murphy is a very pleasant 77 yo caucasian female with multifactorial anemia as well as IgG kappa MGUS.  Protein studies are pending.  ESA given, Hgb 10.9.  Iron studies and B 12 level pending. We will replace if needed.  Lab and injection monthly.  Follow-up in 4 months.   Eileen Stanford, NP 8/6/20242:52 PM

## 2022-11-18 NOTE — Patient Instructions (Signed)

## 2022-11-19 ENCOUNTER — Other Ambulatory Visit: Payer: Self-pay | Admitting: Family

## 2022-11-19 DIAGNOSIS — E538 Deficiency of other specified B group vitamins: Secondary | ICD-10-CM

## 2022-12-23 ENCOUNTER — Ambulatory Visit: Payer: Medicare Other

## 2022-12-23 ENCOUNTER — Other Ambulatory Visit: Payer: Medicare Other

## 2022-12-24 ENCOUNTER — Inpatient Hospital Stay: Payer: Medicare Other

## 2022-12-24 ENCOUNTER — Inpatient Hospital Stay: Payer: Medicare Other | Attending: Hematology & Oncology

## 2022-12-24 DIAGNOSIS — D500 Iron deficiency anemia secondary to blood loss (chronic): Secondary | ICD-10-CM

## 2022-12-24 DIAGNOSIS — N189 Chronic kidney disease, unspecified: Secondary | ICD-10-CM | POA: Insufficient documentation

## 2022-12-24 DIAGNOSIS — D472 Monoclonal gammopathy: Secondary | ICD-10-CM

## 2022-12-24 DIAGNOSIS — D631 Anemia in chronic kidney disease: Secondary | ICD-10-CM | POA: Diagnosis not present

## 2022-12-24 DIAGNOSIS — E538 Deficiency of other specified B group vitamins: Secondary | ICD-10-CM

## 2022-12-24 LAB — CMP (CANCER CENTER ONLY)
ALT: 11 U/L (ref 0–44)
AST: 18 U/L (ref 15–41)
Albumin: 3.8 g/dL (ref 3.5–5.0)
Alkaline Phosphatase: 65 U/L (ref 38–126)
Anion gap: 7 (ref 5–15)
BUN: 16 mg/dL (ref 8–23)
CO2: 26 mmol/L (ref 22–32)
Calcium: 8.9 mg/dL (ref 8.9–10.3)
Chloride: 105 mmol/L (ref 98–111)
Creatinine: 0.8 mg/dL (ref 0.44–1.00)
GFR, Estimated: >60 mL/min (ref >60)
Glucose, Bld: 96 mg/dL (ref 70–99)
Potassium: 4.1 mmol/L (ref 3.5–5.1)
Sodium: 138 mmol/L (ref 135–145)
Total Bilirubin: 0.3 mg/dL (ref 0.3–1.2)
Total Protein: 7.6 g/dL (ref 6.5–8.1)

## 2022-12-24 LAB — CBC WITH DIFFERENTIAL (CANCER CENTER ONLY)
Abs Immature Granulocytes: 0.01 10*3/uL (ref 0.00–0.07)
Basophils Absolute: 0 10*3/uL (ref 0.0–0.1)
Basophils Relative: 1 %
Eosinophils Absolute: 0.4 10*3/uL (ref 0.0–0.5)
Eosinophils Relative: 5 %
HCT: 37.6 % (ref 36.0–46.0)
Hemoglobin: 12.4 g/dL (ref 12.0–15.0)
Immature Granulocytes: 0 %
Lymphocytes Relative: 31 %
Lymphs Abs: 2.5 10*3/uL (ref 0.7–4.0)
MCH: 31.2 pg (ref 26.0–34.0)
MCHC: 33 g/dL (ref 30.0–36.0)
MCV: 94.7 fL (ref 80.0–100.0)
Monocytes Absolute: 1.1 10*3/uL — ABNORMAL HIGH (ref 0.1–1.0)
Monocytes Relative: 14 %
Neutro Abs: 3.9 10*3/uL (ref 1.7–7.7)
Neutrophils Relative %: 49 %
Platelet Count: 303 10*3/uL (ref 150–400)
RBC: 3.97 MIL/uL (ref 3.87–5.11)
RDW: 13.5 % (ref 11.5–15.5)
WBC Count: 7.8 10*3/uL (ref 4.0–10.5)
nRBC: 0 % (ref 0.0–0.2)

## 2022-12-24 NOTE — Progress Notes (Signed)
Pt's hemoglobin noted at 12.4. No injection needed at this time per MD orders. Pt given copy of current labs and Schedule. Instructed to follow schedule  and call our office if needs occurs. Pt verbalized understanding of insturctions

## 2023-01-20 ENCOUNTER — Inpatient Hospital Stay: Payer: Medicare Other | Attending: Hematology & Oncology

## 2023-01-20 DIAGNOSIS — E538 Deficiency of other specified B group vitamins: Secondary | ICD-10-CM

## 2023-01-20 DIAGNOSIS — D472 Monoclonal gammopathy: Secondary | ICD-10-CM

## 2023-01-20 DIAGNOSIS — D509 Iron deficiency anemia, unspecified: Secondary | ICD-10-CM | POA: Insufficient documentation

## 2023-01-20 DIAGNOSIS — N189 Chronic kidney disease, unspecified: Secondary | ICD-10-CM | POA: Insufficient documentation

## 2023-01-20 DIAGNOSIS — D631 Anemia in chronic kidney disease: Secondary | ICD-10-CM | POA: Insufficient documentation

## 2023-01-20 DIAGNOSIS — D500 Iron deficiency anemia secondary to blood loss (chronic): Secondary | ICD-10-CM

## 2023-01-20 LAB — CBC WITH DIFFERENTIAL (CANCER CENTER ONLY)
Abs Immature Granulocytes: 0.07 10*3/uL (ref 0.00–0.07)
Basophils Absolute: 0 10*3/uL (ref 0.0–0.1)
Basophils Relative: 0 %
Eosinophils Absolute: 0.3 10*3/uL (ref 0.0–0.5)
Eosinophils Relative: 4 %
HCT: 39.1 % (ref 36.0–46.0)
Hemoglobin: 12.9 g/dL (ref 12.0–15.0)
Immature Granulocytes: 1 %
Lymphocytes Relative: 26 %
Lymphs Abs: 2.1 10*3/uL (ref 0.7–4.0)
MCH: 30.1 pg (ref 26.0–34.0)
MCHC: 33 g/dL (ref 30.0–36.0)
MCV: 91.4 fL (ref 80.0–100.0)
Monocytes Absolute: 0.9 10*3/uL (ref 0.1–1.0)
Monocytes Relative: 12 %
Neutro Abs: 4.6 10*3/uL (ref 1.7–7.7)
Neutrophils Relative %: 57 %
Platelet Count: 249 10*3/uL (ref 150–400)
RBC: 4.28 MIL/uL (ref 3.87–5.11)
RDW: 13.1 % (ref 11.5–15.5)
WBC Count: 8.1 10*3/uL (ref 4.0–10.5)
nRBC: 0 % (ref 0.0–0.2)

## 2023-01-20 LAB — RETICULOCYTES
Immature Retic Fract: 5.1 % (ref 2.3–15.9)
RBC.: 4.23 MIL/uL (ref 3.87–5.11)
Retic Count, Absolute: 32.1 10*3/uL (ref 19.0–186.0)
Retic Ct Pct: 0.8 % (ref 0.4–3.1)

## 2023-01-20 LAB — CMP (CANCER CENTER ONLY)
ALT: 12 U/L (ref 0–44)
AST: 22 U/L (ref 15–41)
Albumin: 3.8 g/dL (ref 3.5–5.0)
Alkaline Phosphatase: 59 U/L (ref 38–126)
Anion gap: 9 (ref 5–15)
BUN: 25 mg/dL — ABNORMAL HIGH (ref 8–23)
CO2: 26 mmol/L (ref 22–32)
Calcium: 8.7 mg/dL — ABNORMAL LOW (ref 8.9–10.3)
Chloride: 100 mmol/L (ref 98–111)
Creatinine: 0.97 mg/dL (ref 0.44–1.00)
GFR, Estimated: >60 mL/min (ref >60)
Glucose, Bld: 106 mg/dL — ABNORMAL HIGH (ref 70–99)
Potassium: 4 mmol/L (ref 3.5–5.1)
Sodium: 135 mmol/L (ref 135–145)
Total Bilirubin: 0.4 mg/dL (ref 0.3–1.2)
Total Protein: 8 g/dL (ref 6.5–8.1)

## 2023-01-20 LAB — VITAMIN B12: Vitamin B-12: 2545 pg/mL — ABNORMAL HIGH (ref 180–914)

## 2023-02-24 ENCOUNTER — Other Ambulatory Visit: Payer: Medicare Other

## 2023-02-24 ENCOUNTER — Ambulatory Visit: Payer: Medicare Other

## 2023-02-25 ENCOUNTER — Inpatient Hospital Stay: Payer: Medicare Other

## 2023-02-25 ENCOUNTER — Inpatient Hospital Stay: Payer: Medicare Other | Attending: Hematology & Oncology

## 2023-02-25 DIAGNOSIS — D500 Iron deficiency anemia secondary to blood loss (chronic): Secondary | ICD-10-CM

## 2023-02-25 DIAGNOSIS — N189 Chronic kidney disease, unspecified: Secondary | ICD-10-CM | POA: Diagnosis present

## 2023-02-25 DIAGNOSIS — D472 Monoclonal gammopathy: Secondary | ICD-10-CM | POA: Insufficient documentation

## 2023-02-25 DIAGNOSIS — D631 Anemia in chronic kidney disease: Secondary | ICD-10-CM | POA: Diagnosis present

## 2023-02-25 DIAGNOSIS — D509 Iron deficiency anemia, unspecified: Secondary | ICD-10-CM | POA: Diagnosis present

## 2023-02-25 DIAGNOSIS — E538 Deficiency of other specified B group vitamins: Secondary | ICD-10-CM

## 2023-02-25 LAB — CBC WITH DIFFERENTIAL (CANCER CENTER ONLY)
Abs Immature Granulocytes: 0.03 10*3/uL (ref 0.00–0.07)
Basophils Absolute: 0 10*3/uL (ref 0.0–0.1)
Basophils Relative: 0 %
Eosinophils Absolute: 0.4 10*3/uL (ref 0.0–0.5)
Eosinophils Relative: 5 %
HCT: 38.3 % (ref 36.0–46.0)
Hemoglobin: 12.9 g/dL (ref 12.0–15.0)
Immature Granulocytes: 0 %
Lymphocytes Relative: 32 %
Lymphs Abs: 2.9 10*3/uL (ref 0.7–4.0)
MCH: 29.9 pg (ref 26.0–34.0)
MCHC: 33.7 g/dL (ref 30.0–36.0)
MCV: 88.9 fL (ref 80.0–100.0)
Monocytes Absolute: 0.7 10*3/uL (ref 0.1–1.0)
Monocytes Relative: 8 %
Neutro Abs: 4.9 10*3/uL (ref 1.7–7.7)
Neutrophils Relative %: 55 %
Platelet Count: 289 10*3/uL (ref 150–400)
RBC: 4.31 MIL/uL (ref 3.87–5.11)
RDW: 13.1 % (ref 11.5–15.5)
WBC Count: 9 10*3/uL (ref 4.0–10.5)
nRBC: 0 % (ref 0.0–0.2)

## 2023-02-25 LAB — CMP (CANCER CENTER ONLY)
ALT: 7 U/L (ref 0–44)
AST: 18 U/L (ref 15–41)
Albumin: 4.2 g/dL (ref 3.5–5.0)
Alkaline Phosphatase: 71 U/L (ref 38–126)
Anion gap: 9 (ref 5–15)
BUN: 17 mg/dL (ref 8–23)
CO2: 28 mmol/L (ref 22–32)
Calcium: 9.5 mg/dL (ref 8.9–10.3)
Chloride: 103 mmol/L (ref 98–111)
Creatinine: 0.98 mg/dL (ref 0.44–1.00)
GFR, Estimated: 60 mL/min — ABNORMAL LOW (ref >60)
Glucose, Bld: 102 mg/dL — ABNORMAL HIGH (ref 70–99)
Potassium: 3.9 mmol/L (ref 3.5–5.1)
Sodium: 140 mmol/L (ref 135–145)
Total Bilirubin: 0.5 mg/dL (ref <1.2)
Total Protein: 8 g/dL (ref 6.5–8.1)

## 2023-02-25 NOTE — Progress Notes (Signed)
Hemoglobin noted at 12.9 today.  No injection needed per Dr. Myna Hidalgo  order.   Copy of labs given to patient , instructed to  follow current  schedule and call our center if issues occur. Pt verbalized understanding. Of instructions

## 2023-03-22 ENCOUNTER — Encounter: Payer: Self-pay | Admitting: Family

## 2023-03-24 ENCOUNTER — Other Ambulatory Visit: Payer: Medicare Other

## 2023-03-24 ENCOUNTER — Ambulatory Visit: Payer: Medicare Other | Admitting: Medical Oncology

## 2023-03-24 ENCOUNTER — Ambulatory Visit: Payer: Medicare Other

## 2023-03-26 ENCOUNTER — Encounter: Payer: Self-pay | Admitting: Medical Oncology

## 2023-03-26 ENCOUNTER — Inpatient Hospital Stay: Payer: Medicare Other

## 2023-03-26 ENCOUNTER — Inpatient Hospital Stay: Payer: Medicare Other | Attending: Hematology & Oncology

## 2023-03-26 ENCOUNTER — Inpatient Hospital Stay (HOSPITAL_BASED_OUTPATIENT_CLINIC_OR_DEPARTMENT_OTHER): Payer: Medicare Other | Admitting: Medical Oncology

## 2023-03-26 VITALS — BP 134/53 | HR 87 | Temp 98.1°F | Resp 18 | Ht 61.0 in | Wt 132.0 lb

## 2023-03-26 DIAGNOSIS — D631 Anemia in chronic kidney disease: Secondary | ICD-10-CM | POA: Diagnosis not present

## 2023-03-26 DIAGNOSIS — D500 Iron deficiency anemia secondary to blood loss (chronic): Secondary | ICD-10-CM

## 2023-03-26 DIAGNOSIS — D472 Monoclonal gammopathy: Secondary | ICD-10-CM | POA: Diagnosis not present

## 2023-03-26 DIAGNOSIS — M069 Rheumatoid arthritis, unspecified: Secondary | ICD-10-CM | POA: Insufficient documentation

## 2023-03-26 DIAGNOSIS — D509 Iron deficiency anemia, unspecified: Secondary | ICD-10-CM | POA: Insufficient documentation

## 2023-03-26 DIAGNOSIS — E538 Deficiency of other specified B group vitamins: Secondary | ICD-10-CM

## 2023-03-26 DIAGNOSIS — Z79899 Other long term (current) drug therapy: Secondary | ICD-10-CM | POA: Diagnosis not present

## 2023-03-26 DIAGNOSIS — N189 Chronic kidney disease, unspecified: Secondary | ICD-10-CM | POA: Diagnosis present

## 2023-03-26 LAB — CBC WITH DIFFERENTIAL (CANCER CENTER ONLY)
Abs Immature Granulocytes: 0.03 10*3/uL (ref 0.00–0.07)
Basophils Absolute: 0 10*3/uL (ref 0.0–0.1)
Basophils Relative: 0 %
Eosinophils Absolute: 0.3 10*3/uL (ref 0.0–0.5)
Eosinophils Relative: 4 %
HCT: 32.7 % — ABNORMAL LOW (ref 36.0–46.0)
Hemoglobin: 11.1 g/dL — ABNORMAL LOW (ref 12.0–15.0)
Immature Granulocytes: 0 %
Lymphocytes Relative: 34 %
Lymphs Abs: 2.8 10*3/uL (ref 0.7–4.0)
MCH: 30.3 pg (ref 26.0–34.0)
MCHC: 33.9 g/dL (ref 30.0–36.0)
MCV: 89.3 fL (ref 80.0–100.0)
Monocytes Absolute: 0.9 10*3/uL (ref 0.1–1.0)
Monocytes Relative: 10 %
Neutro Abs: 4.3 10*3/uL (ref 1.7–7.7)
Neutrophils Relative %: 52 %
Platelet Count: 282 10*3/uL (ref 150–400)
RBC: 3.66 MIL/uL — ABNORMAL LOW (ref 3.87–5.11)
RDW: 14.3 % (ref 11.5–15.5)
WBC Count: 8.4 10*3/uL (ref 4.0–10.5)
nRBC: 0 % (ref 0.0–0.2)

## 2023-03-26 LAB — CMP (CANCER CENTER ONLY)
ALT: 8 U/L (ref 0–44)
AST: 17 U/L (ref 15–41)
Albumin: 3.6 g/dL (ref 3.5–5.0)
Alkaline Phosphatase: 123 U/L (ref 38–126)
Anion gap: 10 (ref 5–15)
BUN: 17 mg/dL (ref 8–23)
CO2: 25 mmol/L (ref 22–32)
Calcium: 9.1 mg/dL (ref 8.9–10.3)
Chloride: 105 mmol/L (ref 98–111)
Creatinine: 0.91 mg/dL (ref 0.44–1.00)
GFR, Estimated: >60 mL/min (ref >60)
Glucose, Bld: 125 mg/dL — ABNORMAL HIGH (ref 70–99)
Potassium: 3.7 mmol/L (ref 3.5–5.1)
Sodium: 140 mmol/L (ref 135–145)
Total Bilirubin: 0.4 mg/dL (ref <1.2)
Total Protein: 7.5 g/dL (ref 6.5–8.1)

## 2023-03-26 LAB — VITAMIN B12: Vitamin B-12: 2892 pg/mL — ABNORMAL HIGH (ref 180–914)

## 2023-03-26 LAB — RETICULOCYTES
Immature Retic Fract: 11.4 % (ref 2.3–15.9)
RBC.: 3.67 MIL/uL — ABNORMAL LOW (ref 3.87–5.11)
Retic Count, Absolute: 40.7 10*3/uL (ref 19.0–186.0)
Retic Ct Pct: 1.1 % (ref 0.4–3.1)

## 2023-03-26 LAB — LACTATE DEHYDROGENASE: LDH: 154 U/L (ref 98–192)

## 2023-03-26 LAB — FERRITIN: Ferritin: 280 ng/mL (ref 11–307)

## 2023-03-26 NOTE — Progress Notes (Signed)
Hematology and Oncology Follow Up Visit  Mallory Murphy [id number] 07-31-1945 77 y.o. 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   Hgb this visit is 11.1  She is doing well and has no complaints at this time. She is going to get her hair done after today's visit.  No blood loss, bruising or petechiae.  She denies fever, chills, n/v, cough, rash, dizziness, SOB, chest pain, palpitations, abdominal pain or changes in bowel or bladder habits.  No swelling, tenderness, numbness or tingling in her extremities at this time.  No falls or syncope.  She ambulates with her Rolator for added support.  Appetite and hydration are good.   ECOG Performance Status: 1 - Symptomatic but completely ambulatory  Medications:  Allergies as of 03/26/2023       Reactions   Macrobid [nitrofurantoin Macrocrystal] Nausea Only        Medication List        Accurate as of March 26, 2023  3:28 PM. If you have any questions, ask your nurse or doctor.          STOP taking these medications    apixaban 2.5 MG Tabs tablet Commonly known as: Eliquis Stopped by: Rushie Chestnut   leflunomide 20 MG tablet Commonly known as: ARAVA Stopped by: Rushie Chestnut   losartan 50 MG tablet Commonly known as: COZAAR Stopped by: Rushie Chestnut   mirtazapine 15 MG tablet Commonly known as: REMERON Stopped by: Rushie Chestnut   oxyCODONE-acetaminophen 5-325 MG tablet Commonly known as: PERCOCET/ROXICET Stopped by: Rushie Chestnut       TAKE these medications    ARANESP IJ Inject 300 mcg into the skin See admin instructions. 300 mcg into the skin every 3 months for Hgb <11   diphenhydrAMINE 25 MG tablet Commonly known as:  BENADRYL Take 25 mg by mouth at bedtime.   escitalopram 20 MG tablet Commonly known as: LEXAPRO Take 20 mg by mouth daily. What changed: Another medication with the same name was removed. Continue taking this medication, and follow the directions you see here. Changed by: Rushie Chestnut   oxybutynin 5 MG tablet Commonly known as: DITROPAN Take 5 mg by mouth at bedtime.   Simponi Aria 50 MG/4ML Soln injection Generic drug: golimumab Inject into the vein every 8 (eight) weeks.   Vitamin D3 50 MCG (2000 UT) capsule Take 2,000 Units by mouth daily. What changed: Another medication with the same name was removed. Continue taking this medication, and follow the directions you see here. Changed by: Rushie Chestnut        Allergies:  Allergies  Allergen Reactions   Macrobid [Nitrofurantoin Macrocrystal] Nausea Only    Past Medical History, Surgical history, Social history, and Family History were reviewed and updated.  Review of Systems: All other 10 point review of systems is negative.   Physical Exam:  height is 5\' 1"  (1.549 m) and weight is 132 lb (59.9 kg). Her oral temperature is 98.1 F (36.7 C). Her blood pressure is 134/53 (abnormal) and her pulse is 87. Her respiration is 18 and oxygen saturation is 100%.   Wt Readings from Last 3 Encounters:  03/26/23 132 lb (59.9  kg)  11/18/22 136 lb 12.8 oz (62.1 kg)  07/09/22 136 lb (61.7 kg)   Constitutional: Ambulating well with her rolling walker Ocular: Sclerae unicteric, pupils equal, round and reactive to light Ear-nose-throat: Oropharynx clear, dentition fair Lymphatic: No cervical or supraclavicular adenopathy Lungs no rales or rhonchi, good excursion bilaterally Heart regular rate and rhythm, no murmur appreciated Abd soft, nontender, positive bowel sounds MSK no focal spinal tenderness, no joint edema Neuro: non-focal, well-oriented, appropriate affect  Lab Results  Component Value Date   WBC 8.4  03/26/2023   HGB 11.1 (L) 03/26/2023   HCT 32.7 (L) 03/26/2023   MCV 89.3 03/26/2023   PLT 282 03/26/2023   Lab Results  Component Value Date   FERRITIN 235 11/18/2022   IRON 44 11/18/2022   TIBC 200 (L) 11/18/2022   UIBC 156 11/18/2022   IRONPCTSAT 22 11/18/2022   Lab Results  Component Value Date   RETICCTPCT 1.1 03/26/2023   RBC 3.67 (L) 03/26/2023   Lab Results  Component Value Date   KPAFRELGTCHN 49.9 (H) 11/18/2022   LAMBDASER 36.9 (H) 11/18/2022   KAPLAMBRATIO 1.35 11/18/2022   Lab Results  Component Value Date   IGGSERUM 1,804 (H) 11/18/2022   IGA 371 11/18/2022   IGMSERUM 99 11/18/2022   Lab Results  Component Value Date   TOTALPROTELP 7.1 11/18/2022   ALBUMINELP 3.3 11/18/2022   A1GS 0.2 11/18/2022   A2GS 0.8 11/18/2022   BETS 0.9 11/18/2022   GAMS 1.9 (H) 11/18/2022   MSPIKE 1.0 (H) 11/18/2022   SPEI Comment 11/18/2022     Chemistry      Component Value Date/Time   NA 140 03/26/2023 1419   K 3.7 03/26/2023 1419   CL 105 03/26/2023 1419   CO2 25 03/26/2023 1419   BUN 17 03/26/2023 1419   CREATININE 0.91 03/26/2023 1419   CREATININE 1.19 (H) 05/07/2012 1553      Component Value Date/Time   CALCIUM 9.1 03/26/2023 1419   ALKPHOS 123 03/26/2023 1419   AST 17 03/26/2023 1419   ALT 8 03/26/2023 1419   BILITOT 0.4 03/26/2023 1419     Encounter Diagnoses  Name Primary?   MGUS (monoclonal gammopathy of unknown significance) Yes   Erythropoietin deficiency anemia    Iron deficiency anemia due to chronic blood loss    Vitamin B 12 deficiency    Impression and Plan: Mallory Murphy is a very pleasant 77 yo caucasian female with multifactorial anemia as well as IgG kappa MGUS.   Protein studies are pending.  ESA held, Hgb 11.1 Iron studies and B 12 level pending. We will replace if needed.  Lab and injection monthly x 3  RTC 4 months APP, labs (CBC w/, CMP, iron, ferritin, B12, MM panel, light chains, LDH), injection  Rushie Chestnut,  PA-C 12/12/20243:28 PM

## 2023-03-27 LAB — IGG, IGA, IGM
IgA: 391 mg/dL (ref 64–422)
IgG (Immunoglobin G), Serum: 1886 mg/dL — ABNORMAL HIGH (ref 586–1602)
IgM (Immunoglobulin M), Srm: 92 mg/dL (ref 26–217)

## 2023-03-27 LAB — IRON AND IRON BINDING CAPACITY (CC-WL,HP ONLY)
Iron: 45 ug/dL (ref 28–170)
Saturation Ratios: 24 % (ref 10.4–31.8)
TIBC: 188 ug/dL — ABNORMAL LOW (ref 250–450)
UIBC: 143 ug/dL — ABNORMAL LOW (ref 148–442)

## 2023-03-28 LAB — KAPPA/LAMBDA LIGHT CHAINS
Kappa free light chain: 59.9 mg/L — ABNORMAL HIGH (ref 3.3–19.4)
Kappa, lambda light chain ratio: 1.37 (ref 0.26–1.65)
Lambda free light chains: 43.6 mg/L — ABNORMAL HIGH (ref 5.7–26.3)

## 2023-03-31 ENCOUNTER — Encounter: Payer: Self-pay | Admitting: Medical Oncology

## 2023-04-02 LAB — PROTEIN ELECTROPHORESIS, SERUM
A/G Ratio: 1 (ref 0.7–1.7)
Albumin ELP: 3.5 g/dL (ref 2.9–4.4)
Alpha-1-Globulin: 0.2 g/dL (ref 0.0–0.4)
Alpha-2-Globulin: 0.7 g/dL (ref 0.4–1.0)
Beta Globulin: 0.9 g/dL (ref 0.7–1.3)
Gamma Globulin: 1.6 g/dL (ref 0.4–1.8)
Globulin, Total: 3.5 g/dL (ref 2.2–3.9)
M-Spike, %: 0.7 g/dL — ABNORMAL HIGH
Total Protein ELP: 7 g/dL (ref 6.0–8.5)

## 2023-04-22 ENCOUNTER — Inpatient Hospital Stay: Payer: Medicare Other

## 2023-04-22 ENCOUNTER — Inpatient Hospital Stay: Payer: Medicare Other | Attending: Hematology & Oncology

## 2023-04-22 DIAGNOSIS — D472 Monoclonal gammopathy: Secondary | ICD-10-CM | POA: Insufficient documentation

## 2023-04-22 DIAGNOSIS — E538 Deficiency of other specified B group vitamins: Secondary | ICD-10-CM | POA: Diagnosis not present

## 2023-04-22 DIAGNOSIS — D631 Anemia in chronic kidney disease: Secondary | ICD-10-CM

## 2023-04-22 DIAGNOSIS — D500 Iron deficiency anemia secondary to blood loss (chronic): Secondary | ICD-10-CM

## 2023-04-22 LAB — CBC WITH DIFFERENTIAL (CANCER CENTER ONLY)
Abs Immature Granulocytes: 0.02 10*3/uL (ref 0.00–0.07)
Basophils Absolute: 0 10*3/uL (ref 0.0–0.1)
Basophils Relative: 0 %
Eosinophils Absolute: 0.4 10*3/uL (ref 0.0–0.5)
Eosinophils Relative: 4 %
HCT: 32.9 % — ABNORMAL LOW (ref 36.0–46.0)
Hemoglobin: 11 g/dL — ABNORMAL LOW (ref 12.0–15.0)
Immature Granulocytes: 0 %
Lymphocytes Relative: 26 %
Lymphs Abs: 2.4 10*3/uL (ref 0.7–4.0)
MCH: 30.5 pg (ref 26.0–34.0)
MCHC: 33.4 g/dL (ref 30.0–36.0)
MCV: 91.1 fL (ref 80.0–100.0)
Monocytes Absolute: 1.1 10*3/uL — ABNORMAL HIGH (ref 0.1–1.0)
Monocytes Relative: 12 %
Neutro Abs: 5.2 10*3/uL (ref 1.7–7.7)
Neutrophils Relative %: 58 %
Platelet Count: 267 10*3/uL (ref 150–400)
RBC: 3.61 MIL/uL — ABNORMAL LOW (ref 3.87–5.11)
RDW: 14.1 % (ref 11.5–15.5)
WBC Count: 9.1 10*3/uL (ref 4.0–10.5)
nRBC: 0 % (ref 0.0–0.2)

## 2023-04-22 LAB — CMP (CANCER CENTER ONLY)
ALT: 7 U/L (ref 0–44)
AST: 16 U/L (ref 15–41)
Albumin: 3.8 g/dL (ref 3.5–5.0)
Alkaline Phosphatase: 79 U/L (ref 38–126)
Anion gap: 7 (ref 5–15)
BUN: 20 mg/dL (ref 8–23)
CO2: 29 mmol/L (ref 22–32)
Calcium: 9.2 mg/dL (ref 8.9–10.3)
Chloride: 104 mmol/L (ref 98–111)
Creatinine: 0.85 mg/dL (ref 0.44–1.00)
GFR, Estimated: >60 mL/min (ref >60)
Glucose, Bld: 89 mg/dL (ref 70–99)
Potassium: 3.7 mmol/L (ref 3.5–5.1)
Sodium: 140 mmol/L (ref 135–145)
Total Bilirubin: 0.3 mg/dL (ref 0.0–1.2)
Total Protein: 7.3 g/dL (ref 6.5–8.1)

## 2023-05-20 ENCOUNTER — Other Ambulatory Visit: Payer: Self-pay | Admitting: Medical Oncology

## 2023-05-20 DIAGNOSIS — D472 Monoclonal gammopathy: Secondary | ICD-10-CM

## 2023-05-20 DIAGNOSIS — D500 Iron deficiency anemia secondary to blood loss (chronic): Secondary | ICD-10-CM

## 2023-05-20 DIAGNOSIS — D631 Anemia in chronic kidney disease: Secondary | ICD-10-CM

## 2023-05-20 DIAGNOSIS — E538 Deficiency of other specified B group vitamins: Secondary | ICD-10-CM

## 2023-05-21 ENCOUNTER — Inpatient Hospital Stay: Payer: Medicare Other | Attending: Hematology & Oncology

## 2023-05-21 ENCOUNTER — Inpatient Hospital Stay: Payer: Medicare Other

## 2023-05-21 DIAGNOSIS — E538 Deficiency of other specified B group vitamins: Secondary | ICD-10-CM | POA: Diagnosis not present

## 2023-05-21 DIAGNOSIS — D631 Anemia in chronic kidney disease: Secondary | ICD-10-CM | POA: Insufficient documentation

## 2023-05-21 DIAGNOSIS — D472 Monoclonal gammopathy: Secondary | ICD-10-CM | POA: Insufficient documentation

## 2023-05-21 DIAGNOSIS — N189 Chronic kidney disease, unspecified: Secondary | ICD-10-CM | POA: Insufficient documentation

## 2023-05-21 DIAGNOSIS — D500 Iron deficiency anemia secondary to blood loss (chronic): Secondary | ICD-10-CM

## 2023-05-21 LAB — CMP (CANCER CENTER ONLY)
ALT: 7 U/L (ref 0–44)
AST: 17 U/L (ref 15–41)
Albumin: 3.7 g/dL (ref 3.5–5.0)
Alkaline Phosphatase: 70 U/L (ref 38–126)
Anion gap: 9 (ref 5–15)
BUN: 16 mg/dL (ref 8–23)
CO2: 28 mmol/L (ref 22–32)
Calcium: 9.4 mg/dL (ref 8.9–10.3)
Chloride: 103 mmol/L (ref 98–111)
Creatinine: 0.87 mg/dL (ref 0.44–1.00)
GFR, Estimated: >60 mL/min (ref >60)
Glucose, Bld: 111 mg/dL — ABNORMAL HIGH (ref 70–99)
Potassium: 3.8 mmol/L (ref 3.5–5.1)
Sodium: 140 mmol/L (ref 135–145)
Total Bilirubin: 0.3 mg/dL (ref 0.0–1.2)
Total Protein: 7.3 g/dL (ref 6.5–8.1)

## 2023-05-21 LAB — CBC WITH DIFFERENTIAL (CANCER CENTER ONLY)
Abs Immature Granulocytes: 0.02 10*3/uL (ref 0.00–0.07)
Basophils Absolute: 0 10*3/uL (ref 0.0–0.1)
Basophils Relative: 1 %
Eosinophils Absolute: 0.4 10*3/uL (ref 0.0–0.5)
Eosinophils Relative: 5 %
HCT: 33.7 % — ABNORMAL LOW (ref 36.0–46.0)
Hemoglobin: 11.3 g/dL — ABNORMAL LOW (ref 12.0–15.0)
Immature Granulocytes: 0 %
Lymphocytes Relative: 28 %
Lymphs Abs: 2.2 10*3/uL (ref 0.7–4.0)
MCH: 30.1 pg (ref 26.0–34.0)
MCHC: 33.5 g/dL (ref 30.0–36.0)
MCV: 89.9 fL (ref 80.0–100.0)
Monocytes Absolute: 0.8 10*3/uL (ref 0.1–1.0)
Monocytes Relative: 10 %
Neutro Abs: 4.4 10*3/uL (ref 1.7–7.7)
Neutrophils Relative %: 56 %
Platelet Count: 269 10*3/uL (ref 150–400)
RBC: 3.75 MIL/uL — ABNORMAL LOW (ref 3.87–5.11)
RDW: 13.4 % (ref 11.5–15.5)
WBC Count: 7.9 10*3/uL (ref 4.0–10.5)
nRBC: 0 % (ref 0.0–0.2)

## 2023-05-21 LAB — IRON AND IRON BINDING CAPACITY (CC-WL,HP ONLY)
Iron: 46 ug/dL (ref 28–170)
Saturation Ratios: 22 % (ref 10.4–31.8)
TIBC: 209 ug/dL — ABNORMAL LOW (ref 250–450)
UIBC: 163 ug/dL (ref 148–442)

## 2023-05-21 LAB — FERRITIN: Ferritin: 232 ng/mL (ref 11–307)

## 2023-05-21 LAB — VITAMIN B12: Vitamin B-12: 666 pg/mL (ref 180–914)

## 2023-05-21 LAB — LACTATE DEHYDROGENASE: LDH: 129 U/L (ref 98–192)

## 2023-05-21 NOTE — Progress Notes (Signed)
 Hemoglobin noted at 11.3 today. No injection needed per Dr. Melvern orders. Current copy of labs given to patient and family member. Pt instucted to keep up coming appointments  and to call our office should issues occur. Pt verbalized understanding of instructions.

## 2023-05-23 LAB — ERYTHROPOIETIN: Erythropoietin: 13.5 m[IU]/mL (ref 2.6–18.5)

## 2023-05-25 LAB — MULTIPLE MYELOMA PANEL, SERUM
Albumin SerPl Elph-Mcnc: 3.5 g/dL (ref 2.9–4.4)
Albumin/Glob SerPl: 1.1 (ref 0.7–1.7)
Alpha 1: 0.2 g/dL (ref 0.0–0.4)
Alpha2 Glob SerPl Elph-Mcnc: 0.7 g/dL (ref 0.4–1.0)
B-Globulin SerPl Elph-Mcnc: 1 g/dL (ref 0.7–1.3)
Gamma Glob SerPl Elph-Mcnc: 1.6 g/dL (ref 0.4–1.8)
Globulin, Total: 3.5 g/dL (ref 2.2–3.9)
IgA: 353 mg/dL (ref 64–422)
IgG (Immunoglobin G), Serum: 1788 mg/dL — ABNORMAL HIGH (ref 586–1602)
IgM (Immunoglobulin M), Srm: 99 mg/dL (ref 26–217)
M Protein SerPl Elph-Mcnc: 0.8 g/dL — ABNORMAL HIGH
Total Protein ELP: 7 g/dL (ref 6.0–8.5)

## 2023-05-25 LAB — KAPPA/LAMBDA LIGHT CHAINS
Kappa free light chain: 44.3 mg/L — ABNORMAL HIGH (ref 3.3–19.4)
Kappa, lambda light chain ratio: 1.35 (ref 0.26–1.65)
Lambda free light chains: 32.9 mg/L — ABNORMAL HIGH (ref 5.7–26.3)

## 2023-05-26 ENCOUNTER — Encounter: Payer: Self-pay | Admitting: Medical Oncology

## 2023-06-16 NOTE — Progress Notes (Signed)
 Patient is no longer receiving cyanocobalamin. Orders removed per Dr. Gustavo Lah instructions.

## 2023-06-18 ENCOUNTER — Inpatient Hospital Stay: Payer: Medicare Other

## 2023-06-23 ENCOUNTER — Inpatient Hospital Stay: Payer: Medicare Other | Attending: Hematology & Oncology

## 2023-06-23 ENCOUNTER — Other Ambulatory Visit: Payer: Self-pay | Admitting: *Deleted

## 2023-06-23 ENCOUNTER — Inpatient Hospital Stay: Payer: Medicare Other

## 2023-06-23 DIAGNOSIS — E538 Deficiency of other specified B group vitamins: Secondary | ICD-10-CM

## 2023-06-23 DIAGNOSIS — D631 Anemia in chronic kidney disease: Secondary | ICD-10-CM

## 2023-06-23 DIAGNOSIS — D500 Iron deficiency anemia secondary to blood loss (chronic): Secondary | ICD-10-CM

## 2023-06-23 DIAGNOSIS — D472 Monoclonal gammopathy: Secondary | ICD-10-CM

## 2023-06-23 LAB — CBC WITH DIFFERENTIAL (CANCER CENTER ONLY)
Abs Immature Granulocytes: 0.01 10*3/uL (ref 0.00–0.07)
Basophils Absolute: 0 10*3/uL (ref 0.0–0.1)
Basophils Relative: 0 %
Eosinophils Absolute: 0.3 10*3/uL (ref 0.0–0.5)
Eosinophils Relative: 4 %
HCT: 36.1 % (ref 36.0–46.0)
Hemoglobin: 12.1 g/dL (ref 12.0–15.0)
Immature Granulocytes: 0 %
Lymphocytes Relative: 34 %
Lymphs Abs: 2.6 10*3/uL (ref 0.7–4.0)
MCH: 30.6 pg (ref 26.0–34.0)
MCHC: 33.5 g/dL (ref 30.0–36.0)
MCV: 91.2 fL (ref 80.0–100.0)
Monocytes Absolute: 0.8 10*3/uL (ref 0.1–1.0)
Monocytes Relative: 10 %
Neutro Abs: 4.1 10*3/uL (ref 1.7–7.7)
Neutrophils Relative %: 52 %
Platelet Count: 281 10*3/uL (ref 150–400)
RBC: 3.96 MIL/uL (ref 3.87–5.11)
RDW: 13.5 % (ref 11.5–15.5)
WBC Count: 7.8 10*3/uL (ref 4.0–10.5)
nRBC: 0 % (ref 0.0–0.2)

## 2023-06-23 LAB — CMP (CANCER CENTER ONLY)
ALT: 8 U/L (ref 0–44)
AST: 16 U/L (ref 15–41)
Albumin: 4 g/dL (ref 3.5–5.0)
Alkaline Phosphatase: 71 U/L (ref 38–126)
Anion gap: 7 (ref 5–15)
BUN: 20 mg/dL (ref 8–23)
CO2: 26 mmol/L (ref 22–32)
Calcium: 8.7 mg/dL — ABNORMAL LOW (ref 8.9–10.3)
Chloride: 106 mmol/L (ref 98–111)
Creatinine: 0.91 mg/dL (ref 0.44–1.00)
GFR, Estimated: >60 mL/min (ref >60)
Glucose, Bld: 136 mg/dL — ABNORMAL HIGH (ref 70–99)
Potassium: 4 mmol/L (ref 3.5–5.1)
Sodium: 139 mmol/L (ref 135–145)
Total Bilirubin: 0.3 mg/dL (ref 0.0–1.2)
Total Protein: 7.6 g/dL (ref 6.5–8.1)

## 2023-07-16 ENCOUNTER — Inpatient Hospital Stay: Payer: Medicare Other

## 2023-07-16 ENCOUNTER — Inpatient Hospital Stay: Payer: Medicare Other | Admitting: Medical Oncology

## 2023-07-21 ENCOUNTER — Inpatient Hospital Stay: Payer: Medicare Other | Admitting: Medical Oncology

## 2023-07-21 ENCOUNTER — Inpatient Hospital Stay: Payer: Medicare Other

## 2023-07-22 ENCOUNTER — Inpatient Hospital Stay: Attending: Hematology & Oncology

## 2023-07-22 ENCOUNTER — Encounter: Payer: Self-pay | Admitting: Medical Oncology

## 2023-07-22 ENCOUNTER — Inpatient Hospital Stay (HOSPITAL_BASED_OUTPATIENT_CLINIC_OR_DEPARTMENT_OTHER): Admitting: Medical Oncology

## 2023-07-22 ENCOUNTER — Inpatient Hospital Stay

## 2023-07-22 VITALS — BP 154/77 | HR 84 | Temp 98.0°F | Resp 18 | Ht 61.0 in | Wt 132.0 lb

## 2023-07-22 DIAGNOSIS — D631 Anemia in chronic kidney disease: Secondary | ICD-10-CM | POA: Diagnosis not present

## 2023-07-22 DIAGNOSIS — E538 Deficiency of other specified B group vitamins: Secondary | ICD-10-CM | POA: Diagnosis not present

## 2023-07-22 DIAGNOSIS — D500 Iron deficiency anemia secondary to blood loss (chronic): Secondary | ICD-10-CM

## 2023-07-22 DIAGNOSIS — N189 Chronic kidney disease, unspecified: Secondary | ICD-10-CM | POA: Diagnosis not present

## 2023-07-22 DIAGNOSIS — D472 Monoclonal gammopathy: Secondary | ICD-10-CM | POA: Diagnosis not present

## 2023-07-22 LAB — CBC WITH DIFFERENTIAL (CANCER CENTER ONLY)
Abs Immature Granulocytes: 0.01 10*3/uL (ref 0.00–0.07)
Basophils Absolute: 0 10*3/uL (ref 0.0–0.1)
Basophils Relative: 0 %
Eosinophils Absolute: 0.3 10*3/uL (ref 0.0–0.5)
Eosinophils Relative: 4 %
HCT: 36.8 % (ref 36.0–46.0)
Hemoglobin: 12.4 g/dL (ref 12.0–15.0)
Immature Granulocytes: 0 %
Lymphocytes Relative: 31 %
Lymphs Abs: 2.5 10*3/uL (ref 0.7–4.0)
MCH: 30.1 pg (ref 26.0–34.0)
MCHC: 33.7 g/dL (ref 30.0–36.0)
MCV: 89.3 fL (ref 80.0–100.0)
Monocytes Absolute: 0.7 10*3/uL (ref 0.1–1.0)
Monocytes Relative: 9 %
Neutro Abs: 4.6 10*3/uL (ref 1.7–7.7)
Neutrophils Relative %: 56 %
Platelet Count: 300 10*3/uL (ref 150–400)
RBC: 4.12 MIL/uL (ref 3.87–5.11)
RDW: 13.3 % (ref 11.5–15.5)
WBC Count: 8.2 10*3/uL (ref 4.0–10.5)
nRBC: 0 % (ref 0.0–0.2)

## 2023-07-22 LAB — CMP (CANCER CENTER ONLY)
ALT: 7 U/L (ref 0–44)
AST: 17 U/L (ref 15–41)
Albumin: 4.1 g/dL (ref 3.5–5.0)
Alkaline Phosphatase: 66 U/L (ref 38–126)
Anion gap: 7 (ref 5–15)
BUN: 18 mg/dL (ref 8–23)
CO2: 25 mmol/L (ref 22–32)
Calcium: 9.1 mg/dL (ref 8.9–10.3)
Chloride: 105 mmol/L (ref 98–111)
Creatinine: 0.8 mg/dL (ref 0.44–1.00)
GFR, Estimated: >60 mL/min (ref >60)
Glucose, Bld: 108 mg/dL — ABNORMAL HIGH (ref 70–99)
Potassium: 3.7 mmol/L (ref 3.5–5.1)
Sodium: 137 mmol/L (ref 135–145)
Total Bilirubin: 0.3 mg/dL (ref 0.0–1.2)
Total Protein: 7.6 g/dL (ref 6.5–8.1)

## 2023-07-22 LAB — LACTATE DEHYDROGENASE: LDH: 138 U/L (ref 98–192)

## 2023-07-22 LAB — VITAMIN B12: Vitamin B-12: 449 pg/mL (ref 180–914)

## 2023-07-22 LAB — FERRITIN: Ferritin: 190 ng/mL (ref 11–307)

## 2023-07-22 NOTE — Progress Notes (Signed)
 Hematology and Oncology Follow Up Visit  Mallory Murphy [id number] Mar 17, 1946 78 y.o. 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   She reports that she has been well. She has no concerns at this time.   She rushed to get here which is why she thinks her BP is up.  No blood loss, bruising or petechiae.  She denies fever, chills, n/v, cough, rash, dizziness, SOB, chest pain, palpitations, abdominal pain or changes in bowel or bladder habits.  No swelling, tenderness, numbness or tingling in her extremities at this time.  Appetite and hydration are good.   Wt Readings from Last 3 Encounters:  07/22/23 132 lb (59.9 kg)  03/26/23 132 lb (59.9 kg)  11/18/22 136 lb 12.8 oz (62.1 kg)    ECOG Performance Status: 1 - Symptomatic but completely ambulatory  Medications:  Allergies as of 07/22/2023       Reactions   Macrobid [nitrofurantoin Macrocrystal] Nausea Only        Medication List        Accurate as of July 22, 2023  3:07 PM. If you have any questions, ask your nurse or doctor.          STOP taking these medications    escitalopram 20 MG tablet Commonly known as: LEXAPRO Stopped by: Rushie Chestnut       TAKE these medications    ARANESP IJ Inject 300 mcg into the skin See admin instructions. 300 mcg into the skin every 3 months for Hgb <11   diphenhydrAMINE 25 MG tablet Commonly known as: BENADRYL Take 25 mg by mouth at bedtime.   DULoxetine 30 MG capsule Commonly known as: CYMBALTA Take 30 mg by mouth daily.   oxybutynin 5 MG tablet Commonly known as: DITROPAN Take 5 mg by mouth at bedtime.   Simponi Aria 50 MG/4ML Soln injection Generic drug: golimumab Inject into the vein every  8 (eight) weeks.   Vitamin D3 50 MCG (2000 UT) capsule Take 2,000 Units by mouth daily.        Allergies:  Allergies  Allergen Reactions   Macrobid [Nitrofurantoin Macrocrystal] Nausea Only    Past Medical History, Surgical history, Social history, and Family History were reviewed and updated.  Review of Systems: All other 10 point review of systems is negative.   Physical Exam:  height is 5\' 1"  (1.549 m) and weight is 132 lb (59.9 kg). Her oral temperature is 98 F (36.7 C). Her blood pressure is 154/77 (abnormal) and her pulse is 84. Her respiration is 18 and oxygen saturation is 100%.   Wt Readings from Last 3 Encounters:  07/22/23 132 lb (59.9 kg)  03/26/23 132 lb (59.9 kg)  11/18/22 136 lb 12.8 oz (62.1 kg)   Constitutional: Ambulating well with her rolling walker Ocular: Sclerae unicteric, pupils equal, round and reactive to light Ear-nose-throat: Oropharynx clear, dentition fair Lymphatic: No cervical or supraclavicular adenopathy Lungs no rales or rhonchi, good excursion bilaterally Heart regular rate and rhythm, no murmur appreciated Abd soft, nontender, positive bowel sounds MSK no focal spinal tenderness, no joint edema Neuro: non-focal, well-oriented, appropriate affect  Lab Results  Component Value Date   WBC 8.2 07/22/2023  HGB 12.4 07/22/2023   HCT 36.8 07/22/2023   MCV 89.3 07/22/2023   PLT 300 07/22/2023   Lab Results  Component Value Date   FERRITIN 232 05/21/2023   IRON 46 05/21/2023   TIBC 209 (L) 05/21/2023   UIBC 163 05/21/2023   IRONPCTSAT 22 05/21/2023   Lab Results  Component Value Date   RETICCTPCT 1.1 03/26/2023   RBC 4.12 07/22/2023   Lab Results  Component Value Date   KPAFRELGTCHN 44.3 (H) 05/21/2023   LAMBDASER 32.9 (H) 05/21/2023   KAPLAMBRATIO 1.35 05/21/2023   Lab Results  Component Value Date   IGGSERUM 1,788 (H) 05/21/2023   IGA 353 05/21/2023   IGMSERUM 99 05/21/2023   Lab Results  Component Value Date    TOTALPROTELP 7.0 05/21/2023   ALBUMINELP 3.5 03/26/2023   A1GS 0.2 03/26/2023   A2GS 0.7 03/26/2023   BETS 0.9 03/26/2023   GAMS 1.6 03/26/2023   MSPIKE 0.7 (H) 03/26/2023   SPEI Comment 03/26/2023     Chemistry      Component Value Date/Time   NA 137 07/22/2023 1424   K 3.7 07/22/2023 1424   CL 105 07/22/2023 1424   CO2 25 07/22/2023 1424   BUN 18 07/22/2023 1424   CREATININE 0.80 07/22/2023 1424   CREATININE 1.19 (H) 05/07/2012 1553      Component Value Date/Time   CALCIUM 9.1 07/22/2023 1424   ALKPHOS 66 07/22/2023 1424   AST 17 07/22/2023 1424   ALT 7 07/22/2023 1424   BILITOT 0.3 07/22/2023 1424     Encounter Diagnoses  Name Primary?   MGUS (monoclonal gammopathy of unknown significance) Yes   Erythropoietin deficiency anemia    Iron deficiency anemia due to chronic blood loss    Vitamin B 12 deficiency     Impression and Plan: Mallory Murphy is a very pleasant 78 yo caucasian female with multifactorial anemia as well as IgG kappa MGUS.   Protein studies are pending.  ESA held, Hgb 12.4 Iron studies and B 12 level pending. We will replace if needed.  Repeat BP is not improved. She has been advised to follow up with PCP  Lab (CBC only)and injection monthly x 3  RTC 4 months APP, labs (CBC w/, CMP, iron, ferritin, B12, MM panel, light chains, LDH), injection  Rushie Chestnut, PA-C 4/9/20253:07 PM

## 2023-07-23 ENCOUNTER — Encounter: Payer: Self-pay | Admitting: Medical Oncology

## 2023-07-23 LAB — IRON AND IRON BINDING CAPACITY (CC-WL,HP ONLY)
Iron: 33 ug/dL (ref 28–170)
Saturation Ratios: 14 % (ref 10.4–31.8)
TIBC: 230 ug/dL — ABNORMAL LOW (ref 250–450)
UIBC: 197 ug/dL (ref 148–442)

## 2023-07-23 LAB — KAPPA/LAMBDA LIGHT CHAINS
Kappa free light chain: 42.2 mg/L — ABNORMAL HIGH (ref 3.3–19.4)
Kappa, lambda light chain ratio: 1.27 (ref 0.26–1.65)
Lambda free light chains: 33.2 mg/L — ABNORMAL HIGH (ref 5.7–26.3)

## 2023-07-26 LAB — MULTIPLE MYELOMA PANEL, SERUM
Albumin SerPl Elph-Mcnc: 3.6 g/dL (ref 2.9–4.4)
Albumin/Glob SerPl: 1.1 (ref 0.7–1.7)
Alpha 1: 0.2 g/dL (ref 0.0–0.4)
Alpha2 Glob SerPl Elph-Mcnc: 0.7 g/dL (ref 0.4–1.0)
B-Globulin SerPl Elph-Mcnc: 0.9 g/dL (ref 0.7–1.3)
Gamma Glob SerPl Elph-Mcnc: 1.7 g/dL (ref 0.4–1.8)
Globulin, Total: 3.6 g/dL (ref 2.2–3.9)
IgA: 401 mg/dL (ref 64–422)
IgG (Immunoglobin G), Serum: 1922 mg/dL — ABNORMAL HIGH (ref 586–1602)
IgM (Immunoglobulin M), Srm: 109 mg/dL (ref 26–217)
M Protein SerPl Elph-Mcnc: 0.7 g/dL — ABNORMAL HIGH
Total Protein ELP: 7.2 g/dL (ref 6.0–8.5)

## 2023-08-19 ENCOUNTER — Other Ambulatory Visit: Payer: Self-pay | Admitting: Medical Oncology

## 2023-08-19 ENCOUNTER — Inpatient Hospital Stay: Attending: Hematology & Oncology

## 2023-08-19 ENCOUNTER — Inpatient Hospital Stay

## 2023-08-19 DIAGNOSIS — D631 Anemia in chronic kidney disease: Secondary | ICD-10-CM

## 2023-08-19 DIAGNOSIS — D472 Monoclonal gammopathy: Secondary | ICD-10-CM | POA: Insufficient documentation

## 2023-08-19 LAB — CBC
HCT: 36.1 % (ref 36.0–46.0)
Hemoglobin: 12.2 g/dL (ref 12.0–15.0)
MCH: 29.9 pg (ref 26.0–34.0)
MCHC: 33.8 g/dL (ref 30.0–36.0)
MCV: 88.5 fL (ref 80.0–100.0)
Platelets: 280 10*3/uL (ref 150–400)
RBC: 4.08 MIL/uL (ref 3.87–5.11)
RDW: 13.2 % (ref 11.5–15.5)
WBC: 8.5 10*3/uL (ref 4.0–10.5)
nRBC: 0 % (ref 0.0–0.2)

## 2023-08-19 NOTE — Progress Notes (Signed)
 Hgb 12.2 No ESA today. Patient notified.

## 2023-08-21 ENCOUNTER — Inpatient Hospital Stay

## 2023-09-21 ENCOUNTER — Inpatient Hospital Stay

## 2023-09-23 ENCOUNTER — Inpatient Hospital Stay

## 2023-09-25 ENCOUNTER — Other Ambulatory Visit: Payer: Self-pay | Admitting: *Deleted

## 2023-09-25 DIAGNOSIS — D500 Iron deficiency anemia secondary to blood loss (chronic): Secondary | ICD-10-CM

## 2023-09-25 DIAGNOSIS — D631 Anemia in chronic kidney disease: Secondary | ICD-10-CM

## 2023-09-25 DIAGNOSIS — D472 Monoclonal gammopathy: Secondary | ICD-10-CM

## 2023-09-25 DIAGNOSIS — E538 Deficiency of other specified B group vitamins: Secondary | ICD-10-CM

## 2023-09-28 ENCOUNTER — Inpatient Hospital Stay: Attending: Hematology & Oncology

## 2023-09-28 ENCOUNTER — Other Ambulatory Visit: Payer: Self-pay | Admitting: Medical Oncology

## 2023-09-28 ENCOUNTER — Inpatient Hospital Stay

## 2023-09-28 DIAGNOSIS — D631 Anemia in chronic kidney disease: Secondary | ICD-10-CM | POA: Diagnosis not present

## 2023-09-28 DIAGNOSIS — E538 Deficiency of other specified B group vitamins: Secondary | ICD-10-CM | POA: Diagnosis not present

## 2023-09-28 DIAGNOSIS — D472 Monoclonal gammopathy: Secondary | ICD-10-CM | POA: Insufficient documentation

## 2023-09-28 DIAGNOSIS — N189 Chronic kidney disease, unspecified: Secondary | ICD-10-CM | POA: Insufficient documentation

## 2023-09-28 LAB — CBC
HCT: 35.1 % — ABNORMAL LOW (ref 36.0–46.0)
Hemoglobin: 11.9 g/dL — ABNORMAL LOW (ref 12.0–15.0)
MCH: 30 pg (ref 26.0–34.0)
MCHC: 33.9 g/dL (ref 30.0–36.0)
MCV: 88.4 fL (ref 80.0–100.0)
Platelets: 275 10*3/uL (ref 150–400)
RBC: 3.97 MIL/uL (ref 3.87–5.11)
RDW: 13.2 % (ref 11.5–15.5)
WBC: 7.4 10*3/uL (ref 4.0–10.5)
nRBC: 0 % (ref 0.0–0.2)

## 2023-09-28 NOTE — Progress Notes (Signed)
 Current labs reviewed. Hemoglobin noted at 11.9. No injection needed per MD's Orders.Current copy of lab results given to patient and family member. Pt and family member instructed to follow current schedule and call our office should issues occur. Pt and family member verbalized understanding of instructions.

## 2023-10-20 ENCOUNTER — Other Ambulatory Visit: Payer: Self-pay | Admitting: Medical Oncology

## 2023-10-20 DIAGNOSIS — D472 Monoclonal gammopathy: Secondary | ICD-10-CM

## 2023-10-20 DIAGNOSIS — D631 Anemia in chronic kidney disease: Secondary | ICD-10-CM

## 2023-10-20 DIAGNOSIS — D500 Iron deficiency anemia secondary to blood loss (chronic): Secondary | ICD-10-CM

## 2023-10-20 DIAGNOSIS — E538 Deficiency of other specified B group vitamins: Secondary | ICD-10-CM

## 2023-10-21 ENCOUNTER — Inpatient Hospital Stay

## 2023-10-21 ENCOUNTER — Inpatient Hospital Stay: Attending: Hematology & Oncology

## 2023-10-21 DIAGNOSIS — D500 Iron deficiency anemia secondary to blood loss (chronic): Secondary | ICD-10-CM | POA: Diagnosis present

## 2023-10-21 DIAGNOSIS — E538 Deficiency of other specified B group vitamins: Secondary | ICD-10-CM

## 2023-10-21 DIAGNOSIS — D472 Monoclonal gammopathy: Secondary | ICD-10-CM | POA: Diagnosis present

## 2023-10-21 DIAGNOSIS — D631 Anemia in chronic kidney disease: Secondary | ICD-10-CM

## 2023-10-21 LAB — CBC WITH DIFFERENTIAL (CANCER CENTER ONLY)
Abs Immature Granulocytes: 0.02 K/uL (ref 0.00–0.07)
Basophils Absolute: 0 K/uL (ref 0.0–0.1)
Basophils Relative: 0 %
Eosinophils Absolute: 0.3 K/uL (ref 0.0–0.5)
Eosinophils Relative: 4 %
HCT: 35.4 % — ABNORMAL LOW (ref 36.0–46.0)
Hemoglobin: 12.2 g/dL (ref 12.0–15.0)
Immature Granulocytes: 0 %
Lymphocytes Relative: 44 %
Lymphs Abs: 3.1 K/uL (ref 0.7–4.0)
MCH: 30 pg (ref 26.0–34.0)
MCHC: 34.5 g/dL (ref 30.0–36.0)
MCV: 87 fL (ref 80.0–100.0)
Monocytes Absolute: 0.8 K/uL (ref 0.1–1.0)
Monocytes Relative: 11 %
Neutro Abs: 3 K/uL (ref 1.7–7.7)
Neutrophils Relative %: 41 %
Platelet Count: 276 K/uL (ref 150–400)
RBC: 4.07 MIL/uL (ref 3.87–5.11)
RDW: 13.2 % (ref 11.5–15.5)
WBC Count: 7.2 K/uL (ref 4.0–10.5)
nRBC: 0 % (ref 0.0–0.2)

## 2023-10-21 LAB — CMP (CANCER CENTER ONLY)
ALT: 8 U/L (ref 0–44)
AST: 18 U/L (ref 15–41)
Albumin: 4 g/dL (ref 3.5–5.0)
Alkaline Phosphatase: 52 U/L (ref 38–126)
Anion gap: 9 (ref 5–15)
BUN: 18 mg/dL (ref 8–23)
CO2: 26 mmol/L (ref 22–32)
Calcium: 9.4 mg/dL (ref 8.9–10.3)
Chloride: 104 mmol/L (ref 98–111)
Creatinine: 0.72 mg/dL (ref 0.44–1.00)
GFR, Estimated: >60 mL/min (ref >60)
Glucose, Bld: 93 mg/dL (ref 70–99)
Potassium: 3.9 mmol/L (ref 3.5–5.1)
Sodium: 139 mmol/L (ref 135–145)
Total Bilirubin: 0.3 mg/dL (ref 0.0–1.2)
Total Protein: 7.6 g/dL (ref 6.5–8.1)

## 2023-10-21 LAB — VITAMIN B12: Vitamin B-12: 343 pg/mL (ref 180–914)

## 2023-10-21 LAB — RETIC PANEL
Immature Retic Fract: 6.7 % (ref 2.3–15.9)
RBC.: 4.08 MIL/uL (ref 3.87–5.11)
Retic Count, Absolute: 45.3 K/uL (ref 19.0–186.0)
Retic Ct Pct: 1.1 % (ref 0.4–3.1)
Reticulocyte Hemoglobin: 34.4 pg (ref >27.9)

## 2023-10-21 LAB — FERRITIN: Ferritin: 286 ng/mL (ref 11–307)

## 2023-10-21 NOTE — Patient Instructions (Signed)

## 2023-10-21 NOTE — Progress Notes (Signed)
 Hgb 12.2. Parameters not met for Aranesp  injection. Patient updated on POC and verbalized understanding. AVS given and reviewed.

## 2023-10-22 LAB — IRON AND IRON BINDING CAPACITY (CC-WL,HP ONLY)
Iron: 54 ug/dL (ref 28–170)
Saturation Ratios: 24 % (ref 10.4–31.8)
TIBC: 225 ug/dL — ABNORMAL LOW (ref 250–450)
UIBC: 171 ug/dL (ref 148–442)

## 2023-10-22 LAB — KAPPA/LAMBDA LIGHT CHAINS
Kappa free light chain: 41 mg/L — ABNORMAL HIGH (ref 3.3–19.4)
Kappa, lambda light chain ratio: 1.33 (ref 0.26–1.65)
Lambda free light chains: 30.9 mg/L — ABNORMAL HIGH (ref 5.7–26.3)

## 2023-10-26 ENCOUNTER — Ambulatory Visit: Payer: Self-pay | Admitting: Medical Oncology

## 2023-10-26 LAB — MULTIPLE MYELOMA PANEL, SERUM
Albumin SerPl Elph-Mcnc: 3.8 g/dL (ref 2.9–4.4)
Albumin/Glob SerPl: 1.1 (ref 0.7–1.7)
Alpha 1: 0.1 g/dL (ref 0.0–0.4)
Alpha2 Glob SerPl Elph-Mcnc: 0.7 g/dL (ref 0.4–1.0)
B-Globulin SerPl Elph-Mcnc: 1 g/dL (ref 0.7–1.3)
Gamma Glob SerPl Elph-Mcnc: 1.8 g/dL (ref 0.4–1.8)
Globulin, Total: 3.6 g/dL (ref 2.2–3.9)
IgA: 382 mg/dL (ref 64–422)
IgG (Immunoglobin G), Serum: 1897 mg/dL — ABNORMAL HIGH (ref 586–1602)
IgM (Immunoglobulin M), Srm: 107 mg/dL (ref 26–217)
M Protein SerPl Elph-Mcnc: 0.8 g/dL — ABNORMAL HIGH
Total Protein ELP: 7.4 g/dL (ref 6.0–8.5)

## 2023-11-18 ENCOUNTER — Inpatient Hospital Stay

## 2023-11-18 ENCOUNTER — Inpatient Hospital Stay: Admitting: Medical Oncology

## 2023-11-19 ENCOUNTER — Inpatient Hospital Stay

## 2023-11-19 ENCOUNTER — Inpatient Hospital Stay: Admitting: Medical Oncology

## 2023-11-20 ENCOUNTER — Other Ambulatory Visit: Payer: Self-pay | Admitting: Internal Medicine

## 2023-11-20 DIAGNOSIS — Z1231 Encounter for screening mammogram for malignant neoplasm of breast: Secondary | ICD-10-CM

## 2023-11-25 ENCOUNTER — Inpatient Hospital Stay

## 2023-11-25 ENCOUNTER — Inpatient Hospital Stay: Admitting: Medical Oncology

## 2023-11-25 ENCOUNTER — Inpatient Hospital Stay: Attending: Hematology & Oncology

## 2023-11-25 ENCOUNTER — Encounter: Payer: Self-pay | Admitting: Medical Oncology

## 2023-11-25 ENCOUNTER — Other Ambulatory Visit: Payer: Self-pay

## 2023-11-25 VITALS — BP 158/67 | HR 83 | Temp 98.3°F | Resp 17 | Ht 61.0 in | Wt 134.0 lb

## 2023-11-25 DIAGNOSIS — D500 Iron deficiency anemia secondary to blood loss (chronic): Secondary | ICD-10-CM | POA: Diagnosis not present

## 2023-11-25 DIAGNOSIS — N189 Chronic kidney disease, unspecified: Secondary | ICD-10-CM | POA: Diagnosis not present

## 2023-11-25 DIAGNOSIS — E538 Deficiency of other specified B group vitamins: Secondary | ICD-10-CM

## 2023-11-25 DIAGNOSIS — D631 Anemia in chronic kidney disease: Secondary | ICD-10-CM

## 2023-11-25 DIAGNOSIS — D472 Monoclonal gammopathy: Secondary | ICD-10-CM | POA: Insufficient documentation

## 2023-11-25 LAB — CMP (CANCER CENTER ONLY)
ALT: 9 U/L (ref 0–44)
AST: 25 U/L (ref 15–41)
Albumin: 4 g/dL (ref 3.5–5.0)
Alkaline Phosphatase: 76 U/L (ref 38–126)
Anion gap: 13 (ref 5–15)
BUN: 23 mg/dL (ref 8–23)
CO2: 24 mmol/L (ref 22–32)
Calcium: 9.7 mg/dL (ref 8.9–10.3)
Chloride: 100 mmol/L (ref 98–111)
Creatinine: 0.9 mg/dL (ref 0.44–1.00)
GFR, Estimated: >60 mL/min (ref >60)
Glucose, Bld: 99 mg/dL (ref 70–99)
Potassium: 3.9 mmol/L (ref 3.5–5.1)
Sodium: 137 mmol/L (ref 135–145)
Total Bilirubin: 0.3 mg/dL (ref 0.0–1.2)
Total Protein: 7.7 g/dL (ref 6.5–8.1)

## 2023-11-25 LAB — CBC WITH DIFFERENTIAL (CANCER CENTER ONLY)
Abs Immature Granulocytes: 0.02 K/uL (ref 0.00–0.07)
Basophils Absolute: 0 K/uL (ref 0.0–0.1)
Basophils Relative: 0 %
Eosinophils Absolute: 0.4 K/uL (ref 0.0–0.5)
Eosinophils Relative: 5 %
HCT: 35 % — ABNORMAL LOW (ref 36.0–46.0)
Hemoglobin: 11.9 g/dL — ABNORMAL LOW (ref 12.0–15.0)
Immature Granulocytes: 0 %
Lymphocytes Relative: 29 %
Lymphs Abs: 2.3 K/uL (ref 0.7–4.0)
MCH: 30.1 pg (ref 26.0–34.0)
MCHC: 34 g/dL (ref 30.0–36.0)
MCV: 88.4 fL (ref 80.0–100.0)
Monocytes Absolute: 1 K/uL (ref 0.1–1.0)
Monocytes Relative: 12 %
Neutro Abs: 4.3 K/uL (ref 1.7–7.7)
Neutrophils Relative %: 54 %
Platelet Count: 281 K/uL (ref 150–400)
RBC: 3.96 MIL/uL (ref 3.87–5.11)
RDW: 12.8 % (ref 11.5–15.5)
WBC Count: 8 K/uL (ref 4.0–10.5)
nRBC: 0 % (ref 0.0–0.2)

## 2023-11-25 LAB — IRON AND IRON BINDING CAPACITY (CC-WL,HP ONLY)
Iron: 36 ug/dL (ref 28–170)
Saturation Ratios: 15 % (ref 10.4–31.8)
TIBC: 248 ug/dL — ABNORMAL LOW (ref 250–450)
UIBC: 212 ug/dL

## 2023-11-25 LAB — LACTATE DEHYDROGENASE: LDH: 169 U/L (ref 98–192)

## 2023-11-25 LAB — VITAMIN B12: Vitamin B-12: 296 pg/mL (ref 180–914)

## 2023-11-25 LAB — FERRITIN: Ferritin: 190 ng/mL (ref 11–307)

## 2023-11-25 NOTE — Progress Notes (Signed)
 Hematology and Oncology Follow Up Visit  Mallory Murphy [id number] [birth year] 78 y.o. 11/25/2023   Principle Diagnosis:  Anemia of erythropoitin deficeincy Anemia of Iron  deficiency Rheumatoid Arthritis IgG Kappa MGUS   Current Therapy:        IV Iron  as indicated  Aranesp  300 mcg sq for Hgb <11   Interim History:  Mallory Murphy is here today for follow-up and consideration of ESA injection.   She reports that she has been well. She has no concerns at this time.  No blood loss, bruising or petechiae.  She denies fever, chills, n/v, cough, rash, dizziness, SOB, chest pain, palpitations, abdominal pain or changes in bowel or bladder habits.  No swelling, tenderness, numbness or tingling in her extremities at this time.  Appetite and hydration are good.   Wt Readings from Last 3 Encounters:  11/25/23 134 lb (60.8 kg)  07/22/23 132 lb (59.9 kg)  03/26/23 132 lb (59.9 kg)    ECOG Performance Status: 1 - Symptomatic but completely ambulatory  Medications:  Allergies as of 11/25/2023       Reactions   Macrobid [nitrofurantoin Macrocrystal] Nausea Only        Medication List        Accurate as of November 25, 2023  3:25 PM. If you have any questions, ask your nurse or doctor.          ARANESP  IJ Inject 300 mcg into the skin See admin instructions. 300 mcg into the skin every 3 months for Hgb <11   diphenhydrAMINE  25 MG tablet Commonly known as: BENADRYL  Take 25 mg by mouth at bedtime.   DULoxetine 30 MG capsule Commonly known as: CYMBALTA Take 30 mg by mouth daily.   oxybutynin  5 MG tablet Commonly known as: DITROPAN  Take 5 mg by mouth at bedtime.   Simponi Aria 50 MG/4ML Soln injection Generic drug: golimumab Inject into the vein every 8 (eight) weeks.   Vitamin D3 50 MCG (2000 UT) capsule Take 2,000 Units by mouth daily.        Allergies:  Allergies  Allergen Reactions   Macrobid [Nitrofurantoin Macrocrystal] Nausea Only    Past Medical  History, Surgical history, Social history, and Family History were reviewed and updated.  Review of Systems: All other 10 point review of systems is negative.   Physical Exam:  height is 5' 1 (1.549 m) and weight is 134 lb (60.8 kg). Her oral temperature is 98.3 F (36.8 C). Her blood pressure is 158/67 (abnormal) and her pulse is 83. Her respiration is 17 and oxygen saturation is 99%.   Wt Readings from Last 3 Encounters:  11/25/23 134 lb (60.8 kg)  07/22/23 132 lb (59.9 kg)  03/26/23 132 lb (59.9 kg)   Constitutional: Ambulating well with her rolling walker Ocular: Sclerae unicteric, pupils equal, round and reactive to light Ear-nose-throat: Oropharynx clear, dentition fair Lymphatic: No cervical or supraclavicular adenopathy Lungs no rales or rhonchi, good excursion bilaterally Heart regular rate and rhythm, no murmur appreciated Abd soft, nontender, positive bowel sounds MSK no focal spinal tenderness, no joint edema Neuro: non-focal, well-oriented, appropriate affect  Lab Results  Component Value Date   WBC 8.0 11/25/2023   HGB 11.9 (L) 11/25/2023   HCT 35.0 (L) 11/25/2023   MCV 88.4 11/25/2023   PLT 281 11/25/2023   Lab Results  Component Value Date   FERRITIN 286 10/21/2023   IRON  54 10/21/2023   TIBC 225 (L) 10/21/2023   UIBC 171 10/21/2023   IRONPCTSAT  24 10/21/2023   Lab Results  Component Value Date   RETICCTPCT 1.1 10/21/2023   RBC 3.96 11/25/2023   Lab Results  Component Value Date   KPAFRELGTCHN 41.0 (H) 10/21/2023   LAMBDASER 30.9 (H) 10/21/2023   KAPLAMBRATIO 1.33 10/21/2023   Lab Results  Component Value Date   IGGSERUM 1,897 (H) 10/21/2023   IGA 382 10/21/2023   IGMSERUM 107 10/21/2023   Lab Results  Component Value Date   TOTALPROTELP 7.4 10/21/2023   ALBUMINELP 3.5 03/26/2023   A1GS 0.2 03/26/2023   A2GS 0.7 03/26/2023   BETS 0.9 03/26/2023   GAMS 1.6 03/26/2023   MSPIKE 0.7 (H) 03/26/2023   SPEI Comment 03/26/2023     Chemistry       Component Value Date/Time   NA 139 10/21/2023 1421   K 3.9 10/21/2023 1421   CL 104 10/21/2023 1421   CO2 26 10/21/2023 1421   BUN 18 10/21/2023 1421   CREATININE 0.72 10/21/2023 1421   CREATININE 1.19 (H) 05/07/2012 1553      Component Value Date/Time   CALCIUM 9.4 10/21/2023 1421   ALKPHOS 52 10/21/2023 1421   AST 18 10/21/2023 1421   ALT 8 10/21/2023 1421   BILITOT 0.3 10/21/2023 1421     Encounter Diagnoses  Name Primary?   MGUS (monoclonal gammopathy of unknown significance) Yes   Iron  deficiency anemia due to chronic blood loss    Vitamin B 12 deficiency    Erythropoietin  deficiency anemia    Impression and Plan: Mallory Murphy is a very pleasant 78 yo caucasian female with multifactorial anemia as well as IgG kappa MGUS.   Protein studies are pending.  ESA held, Hgb 11.9 Iron  studies and B 12 level pending. We will replace if needed.  Continue PCP follow up for BP management   Lab (CBC only)and injection monthly x 3  RTC 4 months [facility] per pt request, labs (CBC w/, CMP, iron , ferritin, B12, MM panel, light chains, LDH), injection  Lauraine CHRISTELLA Dais, PA-C 8/13/20253:25 PM

## 2023-11-26 ENCOUNTER — Ambulatory Visit: Payer: Self-pay | Admitting: Medical Oncology

## 2023-11-26 LAB — KAPPA/LAMBDA LIGHT CHAINS
Kappa free light chain: 47.5 mg/L — ABNORMAL HIGH (ref 3.3–19.4)
Kappa, lambda light chain ratio: 1.24 (ref 0.26–1.65)
Lambda free light chains: 38.3 mg/L — ABNORMAL HIGH (ref 5.7–26.3)

## 2023-11-26 LAB — MULTIPLE MYELOMA PANEL, SERUM
Albumin SerPl Elph-Mcnc: 3.2 g/dL (ref 2.9–4.4)
Albumin/Glob SerPl: 0.8 (ref 0.7–1.7)
Alpha 1: 0.3 g/dL (ref 0.0–0.4)
Alpha2 Glob SerPl Elph-Mcnc: 0.9 g/dL (ref 0.4–1.0)
B-Globulin SerPl Elph-Mcnc: 1.1 g/dL (ref 0.7–1.3)
Gamma Glob SerPl Elph-Mcnc: 1.8 g/dL (ref 0.4–1.8)
Globulin, Total: 4.1 g/dL — ABNORMAL HIGH (ref 2.2–3.9)
IgA: 378 mg/dL (ref 64–422)
IgG (Immunoglobin G), Serum: 1749 mg/dL — ABNORMAL HIGH (ref 586–1602)
IgM (Immunoglobulin M), Srm: 100 mg/dL (ref 26–217)
M Protein SerPl Elph-Mcnc: 1.1 g/dL — ABNORMAL HIGH
Total Protein ELP: 7.3 g/dL (ref 6.0–8.5)

## 2023-12-23 ENCOUNTER — Ambulatory Visit
Admission: RE | Admit: 2023-12-23 | Discharge: 2023-12-23 | Disposition: A | Discharge status: Home / Self Care | Source: Ambulatory Visit | Attending: Internal Medicine | Admitting: Internal Medicine

## 2023-12-23 ENCOUNTER — Encounter: Payer: Self-pay | Admitting: Hematology & Oncology

## 2023-12-23 DIAGNOSIS — Z1231 Encounter for screening mammogram for malignant neoplasm of breast: Secondary | ICD-10-CM

## 2023-12-28 ENCOUNTER — Inpatient Hospital Stay

## 2023-12-28 ENCOUNTER — Inpatient Hospital Stay: Attending: Hematology & Oncology

## 2023-12-29 ENCOUNTER — Inpatient Hospital Stay: Attending: Hematology & Oncology

## 2023-12-29 ENCOUNTER — Inpatient Hospital Stay

## 2023-12-29 DIAGNOSIS — D472 Monoclonal gammopathy: Secondary | ICD-10-CM | POA: Diagnosis present

## 2023-12-29 DIAGNOSIS — E538 Deficiency of other specified B group vitamins: Secondary | ICD-10-CM

## 2023-12-29 DIAGNOSIS — D631 Anemia in chronic kidney disease: Secondary | ICD-10-CM

## 2023-12-29 DIAGNOSIS — D500 Iron deficiency anemia secondary to blood loss (chronic): Secondary | ICD-10-CM | POA: Insufficient documentation

## 2023-12-29 LAB — CMP (CANCER CENTER ONLY)
ALT: 8 U/L (ref 0–44)
AST: 26 U/L (ref 15–41)
Albumin: 4.1 g/dL (ref 3.5–5.0)
Alkaline Phosphatase: 78 U/L (ref 38–126)
Anion gap: 13 (ref 5–15)
BUN: 19 mg/dL (ref 8–23)
CO2: 25 mmol/L (ref 22–32)
Calcium: 9.8 mg/dL (ref 8.9–10.3)
Chloride: 101 mmol/L (ref 98–111)
Creatinine: 0.89 mg/dL (ref 0.44–1.00)
GFR, Estimated: >60 mL/min (ref >60)
Glucose, Bld: 103 mg/dL — ABNORMAL HIGH (ref 70–99)
Potassium: 4.1 mmol/L (ref 3.5–5.1)
Sodium: 139 mmol/L (ref 135–145)
Total Bilirubin: 0.4 mg/dL (ref 0.0–1.2)
Total Protein: 7.5 g/dL (ref 6.5–8.1)

## 2023-12-29 LAB — CBC WITH DIFFERENTIAL (CANCER CENTER ONLY)
Abs Immature Granulocytes: 0.02 K/uL (ref 0.00–0.07)
Basophils Absolute: 0 K/uL (ref 0.0–0.1)
Basophils Relative: 0 %
Eosinophils Absolute: 0.2 K/uL (ref 0.0–0.5)
Eosinophils Relative: 2 %
HCT: 34 % — ABNORMAL LOW (ref 36.0–46.0)
Hemoglobin: 11.6 g/dL — ABNORMAL LOW (ref 12.0–15.0)
Immature Granulocytes: 0 %
Lymphocytes Relative: 21 %
Lymphs Abs: 1.9 K/uL (ref 0.7–4.0)
MCH: 30.1 pg (ref 26.0–34.0)
MCHC: 34.1 g/dL (ref 30.0–36.0)
MCV: 88.1 fL (ref 80.0–100.0)
Monocytes Absolute: 0.9 K/uL (ref 0.1–1.0)
Monocytes Relative: 9 %
Neutro Abs: 6.2 K/uL (ref 1.7–7.7)
Neutrophils Relative %: 68 %
Platelet Count: 261 K/uL (ref 150–400)
RBC: 3.86 MIL/uL — ABNORMAL LOW (ref 3.87–5.11)
RDW: 13.2 % (ref 11.5–15.5)
WBC Count: 9.2 K/uL (ref 4.0–10.5)
nRBC: 0 % (ref 0.0–0.2)

## 2023-12-29 LAB — IRON AND IRON BINDING CAPACITY (CC-WL,HP ONLY)
Iron: 49 ug/dL (ref 28–170)
Saturation Ratios: 19 % (ref 10.4–31.8)
TIBC: 252 ug/dL (ref 250–450)
UIBC: 203 ug/dL

## 2023-12-29 LAB — VITAMIN B12: Vitamin B-12: 298 pg/mL (ref 180–914)

## 2023-12-29 LAB — FERRITIN: Ferritin: 152 ng/mL (ref 11–307)

## 2023-12-29 LAB — LACTATE DEHYDROGENASE: LDH: 224 U/L — ABNORMAL HIGH (ref 98–192)

## 2023-12-29 NOTE — Progress Notes (Signed)
 Hgb 11.6.  No injection today.  PAtient notified

## 2023-12-30 LAB — KAPPA/LAMBDA LIGHT CHAINS
Kappa free light chain: 50.3 mg/L — ABNORMAL HIGH (ref 3.3–19.4)
Kappa, lambda light chain ratio: 1.43 (ref 0.26–1.65)
Lambda free light chains: 35.2 mg/L — ABNORMAL HIGH (ref 5.7–26.3)

## 2024-01-02 LAB — MULTIPLE MYELOMA PANEL, SERUM
Albumin SerPl Elph-Mcnc: 3.3 g/dL (ref 2.9–4.4)
Albumin/Glob SerPl: 0.9 (ref 0.7–1.7)
Alpha 1: 0.2 g/dL (ref 0.0–0.4)
Alpha2 Glob SerPl Elph-Mcnc: 0.8 g/dL (ref 0.4–1.0)
B-Globulin SerPl Elph-Mcnc: 1.1 g/dL (ref 0.7–1.3)
Gamma Glob SerPl Elph-Mcnc: 1.8 g/dL (ref 0.4–1.8)
Globulin, Total: 3.8 g/dL (ref 2.2–3.9)
IgA: 378 mg/dL (ref 64–422)
IgG (Immunoglobin G), Serum: 1895 mg/dL — ABNORMAL HIGH (ref 586–1602)
IgM (Immunoglobulin M), Srm: 100 mg/dL (ref 26–217)
M Protein SerPl Elph-Mcnc: 0.9 g/dL — ABNORMAL HIGH
Total Protein ELP: 7.1 g/dL (ref 6.0–8.5)

## 2024-01-04 ENCOUNTER — Ambulatory Visit: Payer: Self-pay | Admitting: Medical Oncology

## 2024-01-05 ENCOUNTER — Other Ambulatory Visit: Payer: Self-pay | Admitting: Medical Oncology

## 2024-01-05 DIAGNOSIS — D472 Monoclonal gammopathy: Secondary | ICD-10-CM

## 2024-01-05 DIAGNOSIS — E538 Deficiency of other specified B group vitamins: Secondary | ICD-10-CM

## 2024-01-05 DIAGNOSIS — D500 Iron deficiency anemia secondary to blood loss (chronic): Secondary | ICD-10-CM

## 2024-01-16 ENCOUNTER — Encounter (HOSPITAL_BASED_OUTPATIENT_CLINIC_OR_DEPARTMENT_OTHER): Payer: Self-pay | Admitting: Emergency Medicine

## 2024-01-16 ENCOUNTER — Emergency Department (HOSPITAL_BASED_OUTPATIENT_CLINIC_OR_DEPARTMENT_OTHER)

## 2024-01-16 ENCOUNTER — Encounter: Payer: Self-pay | Admitting: Hematology & Oncology

## 2024-01-16 ENCOUNTER — Other Ambulatory Visit: Payer: Self-pay

## 2024-01-16 ENCOUNTER — Emergency Department (HOSPITAL_BASED_OUTPATIENT_CLINIC_OR_DEPARTMENT_OTHER)
Admission: EM | Admit: 2024-01-16 | Discharge: 2024-01-16 | Disposition: A | Discharge status: Home / Self Care | Attending: Emergency Medicine | Admitting: Emergency Medicine

## 2024-01-16 DIAGNOSIS — J90 Pleural effusion, not elsewhere classified: Secondary | ICD-10-CM | POA: Insufficient documentation

## 2024-01-16 DIAGNOSIS — Y92194 Driveway of other specified residential institution as the place of occurrence of the external cause: Secondary | ICD-10-CM | POA: Diagnosis not present

## 2024-01-16 DIAGNOSIS — N3000 Acute cystitis without hematuria: Secondary | ICD-10-CM | POA: Insufficient documentation

## 2024-01-16 DIAGNOSIS — M545 Low back pain, unspecified: Secondary | ICD-10-CM | POA: Insufficient documentation

## 2024-01-16 DIAGNOSIS — S2241XA Multiple fractures of ribs, right side, initial encounter for closed fracture: Secondary | ICD-10-CM | POA: Insufficient documentation

## 2024-01-16 DIAGNOSIS — Z79899 Other long term (current) drug therapy: Secondary | ICD-10-CM | POA: Insufficient documentation

## 2024-01-16 DIAGNOSIS — W19XXXA Unspecified fall, initial encounter: Secondary | ICD-10-CM

## 2024-01-16 DIAGNOSIS — Y93H2 Activity, gardening and landscaping: Secondary | ICD-10-CM | POA: Insufficient documentation

## 2024-01-16 DIAGNOSIS — D72829 Elevated white blood cell count, unspecified: Secondary | ICD-10-CM | POA: Diagnosis not present

## 2024-01-16 DIAGNOSIS — M546 Pain in thoracic spine: Secondary | ICD-10-CM | POA: Diagnosis not present

## 2024-01-16 DIAGNOSIS — W1830XA Fall on same level, unspecified, initial encounter: Secondary | ICD-10-CM | POA: Diagnosis not present

## 2024-01-16 DIAGNOSIS — J9811 Atelectasis: Secondary | ICD-10-CM | POA: Insufficient documentation

## 2024-01-16 DIAGNOSIS — R079 Chest pain, unspecified: Secondary | ICD-10-CM | POA: Diagnosis present

## 2024-01-16 LAB — URINALYSIS, W/ REFLEX TO CULTURE (INFECTION SUSPECTED)
Bilirubin Urine: NEGATIVE
Glucose, UA: NEGATIVE mg/dL
Ketones, ur: NEGATIVE mg/dL
Nitrite: NEGATIVE
Protein, ur: NEGATIVE mg/dL
Specific Gravity, Urine: 1.015 (ref 1.005–1.030)
WBC, UA: >50 WBC/hpf (ref 0–5)
pH: 6 (ref 5.0–8.0)

## 2024-01-16 LAB — COMPREHENSIVE METABOLIC PANEL WITH GFR
ALT: 11 U/L (ref 0–44)
AST: 32 U/L (ref 15–41)
Albumin: 3.9 g/dL (ref 3.5–5.0)
Alkaline Phosphatase: 83 U/L (ref 38–126)
Anion gap: 12 (ref 5–15)
BUN: 13 mg/dL (ref 8–23)
CO2: 24 mmol/L (ref 22–32)
Calcium: 8.7 mg/dL — ABNORMAL LOW (ref 8.9–10.3)
Chloride: 102 mmol/L (ref 98–111)
Creatinine, Ser: 0.77 mg/dL (ref 0.44–1.00)
GFR, Estimated: >60 mL/min (ref >60)
Glucose, Bld: 111 mg/dL — ABNORMAL HIGH (ref 70–99)
Potassium: 4 mmol/L (ref 3.5–5.1)
Sodium: 138 mmol/L (ref 135–145)
Total Bilirubin: 0.4 mg/dL (ref 0.0–1.2)
Total Protein: 7.6 g/dL (ref 6.5–8.1)

## 2024-01-16 LAB — CBC WITH DIFFERENTIAL/PLATELET
Abs Immature Granulocytes: 0.04 K/uL (ref 0.00–0.07)
Basophils Absolute: 0 K/uL (ref 0.0–0.1)
Basophils Relative: 0 %
Eosinophils Absolute: 0.1 K/uL (ref 0.0–0.5)
Eosinophils Relative: 1 %
HCT: 34.7 % — ABNORMAL LOW (ref 36.0–46.0)
Hemoglobin: 11.8 g/dL — ABNORMAL LOW (ref 12.0–15.0)
Immature Granulocytes: 0 %
Lymphocytes Relative: 11 %
Lymphs Abs: 1.4 K/uL (ref 0.7–4.0)
MCH: 30.1 pg (ref 26.0–34.0)
MCHC: 34 g/dL (ref 30.0–36.0)
MCV: 88.5 fL (ref 80.0–100.0)
Monocytes Absolute: 1.8 K/uL — ABNORMAL HIGH (ref 0.1–1.0)
Monocytes Relative: 13 %
Neutro Abs: 9.8 K/uL — ABNORMAL HIGH (ref 1.7–7.7)
Neutrophils Relative %: 75 %
Platelets: 265 K/uL (ref 150–400)
RBC: 3.92 MIL/uL (ref 3.87–5.11)
RDW: 13.6 % (ref 11.5–15.5)
WBC: 13.2 K/uL — ABNORMAL HIGH (ref 4.0–10.5)
nRBC: 0 % (ref 0.0–0.2)

## 2024-01-16 MED ORDER — CEPHALEXIN 500 MG PO CAPS: 500.0000 mg | ORAL_CAPSULE | Freq: Four times a day (QID) | ORAL | 0 refills | Status: AC

## 2024-01-16 MED ORDER — TRAMADOL HCL 50 MG PO TABS: 50.0000 mg | ORAL_TABLET | Freq: Four times a day (QID) | ORAL | 0 refills | Status: AC | PRN

## 2024-01-16 MED ORDER — IOHEXOL 300 MG/ML  SOLN
100.0000 mL | Freq: Once | INTRAMUSCULAR | Status: AC | PRN
  Administered 2024-01-16: 70 mL via INTRAVENOUS

## 2024-01-16 NOTE — Discharge Instructions (Signed)
 It was a pleasure taking care of you here today  As we discussed you have multiple rib fractures on the right where your pain is.  I written you for a short course of tramadol .  Take as prescribed.  Use caution as medication may make you sleepy.  You may also take additional Tylenol  with this.  Your urine did show a urinary tract infection.  We are treating this with an antibiotic.  You are giving an incentive spirometer.  Use every hour while awake  Make sure to follow-up with primary care provider early next week  Return for new or worsening symptoms

## 2024-01-16 NOTE — ED Provider Notes (Signed)
 [place] [facility] Provider Note   CSN: [id number] Arrival date & time: 01/16/24  1410    Patient presents with: Mallory Murphy Mallory Murphy is a 78 y.o. female here for evaluation of fall.  Had a fall while watering the bushes on Tuesday, 4 days PTA.  She has had pain to her midline back, right posterior chest and flank since.  Uses a rolling walker at baseline.  She has been taking some tramadol  at home that she had leftover from a prior injury.  She thinks this was a mechanical fall.  Denies hitting head, LOC, anticoagulation.  No nausea, vomiting, anterior chest pain, abdominal pain.  No pain to upper or lower extremities.    HPI     Prior to Admission medications   Medication Sig Start Date End Date Taking? Authorizing Provider  cephALEXin (KEFLEX) 500 MG capsule Take 1 capsule (500 mg total) by mouth 4 (four) times daily. 01/16/24  Yes Norena Bratton A, PA-C  traMADol  (ULTRAM ) 50 MG tablet Take 1 tablet (50 mg total) by mouth every 6 (six) hours as needed. 01/16/24  Yes Maxfield Gildersleeve A, PA-C  Cholecalciferol (VITAMIN D3) 50 MCG (2000 UT) capsule Take 2,000 Units by mouth daily.    [provider]  Darbepoetin Alfa -Albumin (ARANESP  IJ) Inject 300 mcg into the skin See admin instructions. 300 mcg into the skin every 3 months for Hgb <11    [provider]  diphenhydrAMINE  (BENADRYL ) 25 MG tablet Take 25 mg by mouth at bedtime.    [provider]  DULoxetine (CYMBALTA) 30 MG capsule Take 30 mg by mouth daily.    [provider]  oxybutynin  (DITROPAN ) 5 MG tablet Take 5 mg by mouth at bedtime. 02/27/20   [provider]  SIMPONI ARIA 50 MG/4ML SOLN injection Inject into the vein every 8 (eight) weeks.    [provider]    Allergies: Macrobid [nitrofurantoin macrocrystal]    Review of Systems  Constitutional: Negative.   HENT: Negative.    Respiratory: Negative.    Cardiovascular:  Negative.        Right posterior chest wall  Gastrointestinal: Negative.   Genitourinary:  Positive for flank pain (right).  Skin: Negative.   Neurological: Negative.   All other systems reviewed and are negative.  Updated Vital Signs BP (!) 186/73   Pulse 89   Temp 98.3 F (36.8 C) (Oral)   Resp 11   Ht 5' 2 (1.575 m)   Wt 59 kg   SpO2 94%   BMI 23.78 kg/m   Physical Exam Vitals and nursing note reviewed.  Constitutional:      General: She is not in acute distress.    Appearance: She is well-developed. She is not ill-appearing.  HENT:     Head: Atraumatic.  Eyes:     Pupils: Pupils are equal, round, and reactive to light.  Cardiovascular:     Rate and Rhythm: Normal rate.     Pulses: Normal pulses.          Radial pulses are 2+ on the right side and 2+ on the left side.       Dorsalis pedis pulses are 2+ on the right side and 2+ on the left side.     Heart sounds: Normal heart sounds.  Pulmonary:     Effort: Pulmonary effort is normal. No respiratory distress.     Breath sounds: Normal breath sounds.  Abdominal:  General: Bowel sounds are normal. There is no distension.     Palpations: Abdomen is soft.     Tenderness: There is no abdominal tenderness. There is right CVA tenderness. There is no left CVA tenderness, guarding or rebound.     Comments: Diffusely right flank, right posterior chest wall without overlying skin changes  Musculoskeletal:        General: Normal range of motion.     Cervical back: Normal range of motion.     Comments: Nontender upper and lower extremities.  Nontender midline C-spine.  Mild tenderness diffusely thoracic, lumbar spine as well as right posterior chest wall into right flank  Skin:    General: Skin is warm and dry.  Neurological:     General: No focal deficit present.     Mental Status: She is alert.     Comments: Contract  Psychiatric:        Mood and Affect: Mood normal.    (all labs ordered are listed, but only  abnormal results are displayed) Labs Reviewed  CBC WITH DIFFERENTIAL/PLATELET - Abnormal; Notable for the following components:      Result Value   WBC 13.2 (*)    Hemoglobin 11.8 (*)    HCT 34.7 (*)    Neutro Abs 9.8 (*)    Monocytes Absolute 1.8 (*)    All other components within normal limits  COMPREHENSIVE METABOLIC PANEL WITH GFR - Abnormal; Notable for the following components:   Glucose, Bld 111 (*)    Calcium 8.7 (*)    All other components within normal limits  URINALYSIS, W/ REFLEX TO CULTURE (INFECTION SUSPECTED) - Abnormal; Notable for the following components:   APPearance CLOUDY (*)    Hgb urine dipstick TRACE (*)    Leukocytes,Ua MODERATE (*)    Bacteria, UA MANY (*)    All other components within normal limits  URINE CULTURE    EKG: None  Radiology: CT CHEST ABDOMEN PELVIS W CONTRAST Result Date: 01/16/2024 CLINICAL DATA:  Poly trauma, blunt. Right-side back pain status post fall. EXAM: CT CHEST, ABDOMEN, AND PELVIS WITH CONTRAST TECHNIQUE: Multidetector CT imaging of the chest, abdomen and pelvis was performed following the standard protocol during bolus administration of intravenous contrast. RADIATION DOSE REDUCTION: This exam was performed according to the departmental dose-optimization program which includes automated exposure control, adjustment of the mA and/or kV according to patient size and/or use of iterative reconstruction technique. CONTRAST:  70mL OMNIPAQUE IOHEXOL 300 MG/ML  SOLN COMPARISON:  None Available. FINDINGS: CT CHEST FINDINGS Cardiovascular: The heart is normal in size and there is a trace pericardial effusion. Multi-vessel coronary artery calcifications are noted. There is atherosclerotic calcification of the aorta a without evidence of aneurysm. The pulmonary trunk is normal in caliber. Mediastinum/Nodes: No mediastinal, hilar, or axillary lymphadenopathy is seen. A small amount of debris is noted in the posterior trachea. The esophagus is within  normal limits. There is a moderate hiatal hernia. Lungs/Pleura: There is a small right pleural effusion. Atelectasis is present bilaterally, greater on the right than on the left. No pneumothorax bilaterally. Musculoskeletal: Left shoulder arthroplasty changes are noted. There is bony deformity of the proximal right humerus with hardware in place, likely related to old trauma. There are acute fractures of the T3 through T6 ribs on the right laterally. Additional old healed rib fractures are noted on the right. Degenerative changes are present in the thoracic spine. CT ABDOMEN PELVIS FINDINGS Hepatobiliary: No hepatic injury or perihepatic hematoma. Gallbladder is unremarkable.  Pancreas: Unremarkable. No pancreatic ductal dilatation or surrounding inflammatory changes. Spleen: No splenic injury or perisplenic hematoma. Adrenals/Urinary Tract: The adrenal glands are within normal limits. No renal calculus or hydronephrosis bilaterally. The bladder is not well seen due to hardware artifact. Stomach/Bowel: There is a moderate hiatal hernia. The stomach is otherwise within normal limits. No bowel obstruction, free air, or pneumatosis is seen. Appendix appears normal. Scattered diverticula are present along the colon without evidence of diverticulitis. Vascular/Lymphatic: Aortic atherosclerosis. No enlarged abdominal or pelvic lymph nodes. Reproductive: Not well seen due to hardware artifact. Other: No abdominopelvic ascites. Musculoskeletal: Degenerative changes are noted in the lumbar spine. Total hip arthroplasty changes are present bilaterally. No acute osseous abnormality. IMPRESSION: 1. Acute fractures of the T3 through T6 ribs on the right. 2. No evidence of solid organ injury. 3. Small right pleural effusion with atelectasis bilaterally. 4. Moderate hiatal hernia. 5. Aortic atherosclerosis and coronary artery calcifications. Electronically Signed   By: Leita Birmingham M.D.   On: 01/16/2024 17:59   CT T-SPINE NO  CHARGE Result Date: 01/16/2024 CLINICAL DATA:  Right-sided back pain after fall in the driveway. EXAM: CT THORACIC SPINE WITHOUT CONTRAST TECHNIQUE: Multidetector CT images of the thoracic were obtained using the standard protocol without intravenous contrast. RADIATION DOSE REDUCTION: This exam was performed according to the departmental dose-optimization program which includes automated exposure control, adjustment of the mA and/or kV according to patient size and/or use of iterative reconstruction technique. COMPARISON:  None Available. FINDINGS: Alignment: No traumatic subluxation. Vertebrae: Slight undulation of T4 superior endplate without loss of height. Posterior elements are intact. Paraspinal and other soft tissues: Fully assessed on concurrent chest CT, reported separately. Disc levels: No spinal canal stenosis. IMPRESSION: Slight undulation of T4 superior endplate without loss of height. This is age indeterminate. Recommend correlation with focal tenderness. Electronically Signed   By: Andrea Gasman M.D.   On: 01/16/2024 17:50   CT L-SPINE NO CHARGE Result Date: 01/16/2024 CLINICAL DATA:  Right-sided back pain after fall in driveway on Tuesday. EXAM: CT LUMBAR SPINE WITHOUT CONTRAST TECHNIQUE: Multidetector CT imaging of the lumbar spine was performed without intravenous contrast administration. Multiplanar CT image reconstructions were also generated. RADIATION DOSE REDUCTION: This exam was performed according to the departmental dose-optimization program which includes automated exposure control, adjustment of the mA and/or kV according to patient size and/or use of iterative reconstruction technique. COMPARISON:  Lumbar spine CT 01/30/2020 FINDINGS: Segmentation: 5 lumbar type vertebrae. Alignment: Mild levo scoliosis centered at L4-L5. No traumatic subluxation or listhesis. Vertebrae: The bones are under mineralized. No acute fracture. Vertebral body heights are normal, no compression  deformity. The posterior elements are intact. Remote sacral fracture. Paraspinal and other soft tissues: Fully assessed on concurrent abdominopelvic CT, reported separately. Mild fatty atrophy of the paraspinal musculatures. Aortic atherosclerosis. Disc levels: Disc space narrowing, endplate spurring and vacuum phenomena L3-L4 through L5-S1. Mild spinal canal stenosis at L4-L5 again seen. Multilevel neural foraminal stenosis IMPRESSION: 1. No acute fracture or subluxation of the lumbar spine. 2. Multilevel degenerative disc disease. Aortic Atherosclerosis (ICD10-I70.0). Electronically Signed   By: Andrea Gasman M.D.   On: 01/16/2024 17:48   DG Ribs Unilateral W/Chest Right Result Date: 01/16/2024 CLINICAL DATA:  Pain after fall.  Fall in the driveway on Tuesday. EXAM: RIGHT RIBS AND CHEST - 3+ VIEW COMPARISON:  Chest radiograph 02/02/2022 FINDINGS: The bones are under mineralized limiting detailed assessment. Allowing for this, no evidence of acute fracture. Remote right third and fourth rib fractures  with callus formation. There is no pneumothorax or pleural effusion. No acute airspace disease. Heart size and mediastinal contours are within normal limits. Aortic atherosclerosis. Chronic and postsurgical change within both proximal humeri. IMPRESSION: 1. No evidence of acute rib fracture. 2. Remote right third and fourth rib fractures. Electronically Signed   By: Andrea Gasman M.D.   On: 01/16/2024 15:23     Procedures   Medications Ordered in the ED  iohexol (OMNIPAQUE) 300 MG/ML solution 100 mL (70 mLs Intravenous Contrast Given 01/16/24 6656)   78 year old history of RA, osteoporosis, lumbar spondylosis here for evaluation of fall.  She had a fall about 4 days ago.  She thought likely mechanical at the time.  She denies hitting her head, LOC or any coagulation.  She has pain diffusely to her thoracic and lumbar spine as well as right posterior chest wall, right flank.  No chest pain, shortness of  breath abdominal pain, nausea, vomiting, numbness or weakness.  She walks with a walker at baseline.  Will plan on labs, imaging, reassess she does not anything for pain at this time.  Labs and imaging personally viewed and interpreted:  CBC leukocytosis 13.2 Metabolic panel without significant abnormality X-ray right ribs with remote 3/4 ribs CT L-spine without significant abnormality CT T-spine with slight undulation T4, age-indeterminate, correlate clinically CT chest abdomen pelvis shows T3-T6 right rib fractures.  Small right pleural effusion with atelectasis EKG wo ischemic changes  Patient reassessed.  She is nontender at T4.  I suspect this is likely old.  We did discuss her rib fractures.  Given this occurred 4 days ago discussed with attending, Dr. Elnor.  Will have her follow-up outpatient.  She was given incentive spirometer here.  Pain control at home.  Given she does have some flank pain she has no UTI symptoms.  Her urine will be sent for culture.  Will treat for pyelonephritis in setting of her flank pain however I suspect this flank pain actually has more to do with her fall.  Patient ambulatory here at her baseline.  She has no hypoxia, tachycardia or tachypnea.  Will have her follow-up outpatient, return for new or worsening symptoms.  The patient has been appropriately medically screened and/or stabilized in the ED. I have low suspicion for any other emergent medical condition which would require further screening, evaluation or treatment in the ED or require inpatient management.  Patient is hemodynamically stable and in no acute distress.  Patient able to ambulate in department prior to ED.  Evaluation does not show acute pathology that would require ongoing or additional emergent interventions while in the emergency department or further inpatient treatment.  I have discussed the diagnosis with the patient and answered all questions.  Pain is been managed while in the  emergency department and patient has no further complaints prior to discharge.  Patient is comfortable with plan discussed in room and is stable for discharge at this time.  I have discussed strict return precautions for returning to the emergency department.  Patient was encouraged to follow-up with PCP/specialist refer to at discharge.                                    Medical Decision Making Amount and/or Complexity of Data Reviewed Independent Historian:     Details: Family at bedside External Data Reviewed: labs, radiology, ECG and notes. Labs: ordered. Decision-making details documented in ED  Course. Radiology: ordered and independent interpretation performed. Decision-making details documented in ED Course. ECG/medicine tests: ordered and independent interpretation performed. Decision-making details documented in ED Course.  Risk OTC drugs. Prescription drug management. Decision regarding hospitalization. Diagnosis or treatment significantly limited by social determinants of health.        Final diagnoses:  Fall, initial encounter  Closed fracture of multiple ribs of right side, initial encounter  Acute cystitis without hematuria    ED Discharge Orders          Ordered    traMADol  (ULTRAM ) 50 MG tablet  Every 6 hours PRN        01/16/24 1813    cephALEXin (KEFLEX) 500 MG capsule  4 times daily        01/16/24 1813               Mace Weinberg A, PA-C 01/16/24 1817    Elnor Jayson LABOR, DO 01/20/24 1617

## 2024-01-16 NOTE — ED Triage Notes (Signed)
 Pt c/o RT side back pain s/p fall in driveway on Tues; amb to triage with rolling walker

## 2024-01-18 LAB — URINE CULTURE: Culture: >=100000 — AB

## 2024-01-19 ENCOUNTER — Telehealth (HOSPITAL_BASED_OUTPATIENT_CLINIC_OR_DEPARTMENT_OTHER): Payer: Self-pay

## 2024-01-19 NOTE — Telephone Encounter (Signed)
 Post ED Visit - Positive Culture Follow-up  Culture report reviewed by antimicrobial stewardship pharmacist: [facility] [x]  Amon Rocher, Pharm.D. []  Venetia Gully, Pharm.D., BCPS AQ-ID []  Garrel Crews, Pharm.D., BCPS []  Almarie Lunger, [address].D., BCPS []  [place], [address].D., BCPS, AAHIVP []  Rosaline Bihari, Pharm.D., BCPS, AAHIVP []  Vernell Meier, PharmD, BCPS []  Latanya Hint, PharmD, BCPS []  Donald Medley, PharmD, BCPS []  Rocky Bold, PharmD []  Dorothyann Alert, PharmD, BCPS []  Morene Babe, PharmD  [facility] []  Rosaline Edison, PharmD []  Romona Bliss, PharmD []  Dolphus Roller, PharmD []  Veva Seip, Rph []  Vernell Daunt) Leonce, PharmD []  Eva Allis, PharmD []  Rosaline Millet, PharmD []  Iantha Batch, PharmD []  Arvin Gauss, PharmD []  Wanda Hasting, PharmD []  Ronal Rav, PharmD []  Rocky Slade, PharmD []  Bard Jeans, PharmD   Positive urine culture Treated with Cephalexin, organism sensitive to the same and no further patient follow-up is required at this time.  Mallory Murphy 01/19/2024, 12:04 PM

## 2024-01-25 ENCOUNTER — Other Ambulatory Visit: Payer: Self-pay | Admitting: Family

## 2024-01-25 DIAGNOSIS — D631 Anemia in chronic kidney disease: Secondary | ICD-10-CM

## 2024-01-26 ENCOUNTER — Inpatient Hospital Stay: Attending: Hematology & Oncology

## 2024-01-26 ENCOUNTER — Inpatient Hospital Stay

## 2024-01-26 DIAGNOSIS — N189 Chronic kidney disease, unspecified: Secondary | ICD-10-CM | POA: Diagnosis not present

## 2024-01-26 DIAGNOSIS — D472 Monoclonal gammopathy: Secondary | ICD-10-CM | POA: Insufficient documentation

## 2024-01-26 DIAGNOSIS — D509 Iron deficiency anemia, unspecified: Secondary | ICD-10-CM | POA: Insufficient documentation

## 2024-01-26 DIAGNOSIS — D631 Anemia in chronic kidney disease: Secondary | ICD-10-CM | POA: Insufficient documentation

## 2024-01-26 LAB — CMP (CANCER CENTER ONLY)
ALT: 12 U/L (ref 0–44)
AST: 30 U/L (ref 15–41)
Albumin: 4.2 g/dL (ref 3.5–5.0)
Alkaline Phosphatase: 127 U/L — ABNORMAL HIGH (ref 38–126)
Anion gap: 13 (ref 5–15)
BUN: 18 mg/dL (ref 8–23)
CO2: 23 mmol/L (ref 22–32)
Calcium: 9.2 mg/dL (ref 8.9–10.3)
Chloride: 104 mmol/L (ref 98–111)
Creatinine: 0.86 mg/dL (ref 0.44–1.00)
GFR, Estimated: >60 mL/min (ref >60)
Glucose, Bld: 110 mg/dL — ABNORMAL HIGH (ref 70–99)
Potassium: 4.5 mmol/L (ref 3.5–5.1)
Sodium: 139 mmol/L (ref 135–145)
Total Bilirubin: 0.4 mg/dL (ref 0.0–1.2)
Total Protein: 8.3 g/dL — ABNORMAL HIGH (ref 6.5–8.1)

## 2024-01-26 LAB — CBC WITH DIFFERENTIAL (CANCER CENTER ONLY)
Abs Immature Granulocytes: 0.01 K/uL (ref 0.00–0.07)
Basophils Absolute: 0 K/uL (ref 0.0–0.1)
Basophils Relative: 0 %
Eosinophils Absolute: 0.2 K/uL (ref 0.0–0.5)
Eosinophils Relative: 3 %
HCT: 36.8 % (ref 36.0–46.0)
Hemoglobin: 12.2 g/dL (ref 12.0–15.0)
Immature Granulocytes: 0 %
Lymphocytes Relative: 25 %
Lymphs Abs: 2.1 K/uL (ref 0.7–4.0)
MCH: 29.6 pg (ref 26.0–34.0)
MCHC: 33.2 g/dL (ref 30.0–36.0)
MCV: 89.3 fL (ref 80.0–100.0)
Monocytes Absolute: 0.7 K/uL (ref 0.1–1.0)
Monocytes Relative: 9 %
Neutro Abs: 5.2 K/uL (ref 1.7–7.7)
Neutrophils Relative %: 63 %
Platelet Count: 352 K/uL (ref 150–400)
RBC: 4.12 MIL/uL (ref 3.87–5.11)
RDW: 13.2 % (ref 11.5–15.5)
WBC Count: 8.3 K/uL (ref 4.0–10.5)
nRBC: 0 % (ref 0.0–0.2)

## 2024-01-26 NOTE — Progress Notes (Signed)
 Hemoglobin noted at 12.2 on labs today. No injection needed per current orders at this time.  Copy of labs given to patient and family member. Pt instructed to follow current schedule and to call our office if issues occur. Pt verbalized understanding of instructions.

## 2024-02-25 ENCOUNTER — Inpatient Hospital Stay: Attending: Hematology & Oncology

## 2024-02-25 ENCOUNTER — Other Ambulatory Visit: Payer: Self-pay | Admitting: *Deleted

## 2024-02-25 ENCOUNTER — Other Ambulatory Visit: Payer: Self-pay | Admitting: Medical Oncology

## 2024-02-25 ENCOUNTER — Inpatient Hospital Stay

## 2024-02-25 DIAGNOSIS — E538 Deficiency of other specified B group vitamins: Secondary | ICD-10-CM

## 2024-02-25 DIAGNOSIS — D631 Anemia in chronic kidney disease: Secondary | ICD-10-CM

## 2024-02-25 DIAGNOSIS — D500 Iron deficiency anemia secondary to blood loss (chronic): Secondary | ICD-10-CM

## 2024-02-25 DIAGNOSIS — D472 Monoclonal gammopathy: Secondary | ICD-10-CM

## 2024-02-25 LAB — CBC WITH DIFFERENTIAL (CANCER CENTER ONLY)
Abs Immature Granulocytes: 0.02 K/uL (ref 0.00–0.07)
Basophils Absolute: 0 K/uL (ref 0.0–0.1)
Basophils Relative: 0 %
Eosinophils Absolute: 0.3 K/uL (ref 0.0–0.5)
Eosinophils Relative: 4 %
HCT: 35.7 % — ABNORMAL LOW (ref 36.0–46.0)
Hemoglobin: 11.9 g/dL — ABNORMAL LOW (ref 12.0–15.0)
Immature Granulocytes: 0 %
Lymphocytes Relative: 28 %
Lymphs Abs: 2.1 K/uL (ref 0.7–4.0)
MCH: 29.6 pg (ref 26.0–34.0)
MCHC: 33.3 g/dL (ref 30.0–36.0)
MCV: 88.8 fL (ref 80.0–100.0)
Monocytes Absolute: 0.9 K/uL (ref 0.1–1.0)
Monocytes Relative: 12 %
Neutro Abs: 4.1 K/uL (ref 1.7–7.7)
Neutrophils Relative %: 56 %
Platelet Count: 265 K/uL (ref 150–400)
RBC: 4.02 MIL/uL (ref 3.87–5.11)
RDW: 13.6 % (ref 11.5–15.5)
WBC Count: 7.4 K/uL (ref 4.0–10.5)
nRBC: 0 % (ref 0.0–0.2)

## 2024-02-25 LAB — CMP (CANCER CENTER ONLY)
ALT: 8 U/L (ref 0–44)
AST: 22 U/L (ref 15–41)
Albumin: 4.2 g/dL (ref 3.5–5.0)
Alkaline Phosphatase: 84 U/L (ref 38–126)
Anion gap: 11 (ref 5–15)
BUN: 17 mg/dL (ref 8–23)
CO2: 24 mmol/L (ref 22–32)
Calcium: 9.3 mg/dL (ref 8.9–10.3)
Chloride: 103 mmol/L (ref 98–111)
Creatinine: 0.81 mg/dL (ref 0.44–1.00)
GFR, Estimated: >60 mL/min (ref >60)
Glucose, Bld: 107 mg/dL — ABNORMAL HIGH (ref 70–99)
Potassium: 4.5 mmol/L (ref 3.5–5.1)
Sodium: 138 mmol/L (ref 135–145)
Total Bilirubin: 0.3 mg/dL (ref 0.0–1.2)
Total Protein: 7.8 g/dL (ref 6.5–8.1)

## 2024-02-25 NOTE — Progress Notes (Signed)
 No injection  for Aranesp   needed today. Copy of labs given to patient.

## 2024-03-28 ENCOUNTER — Inpatient Hospital Stay

## 2024-03-28 ENCOUNTER — Inpatient Hospital Stay: Admitting: Family

## 2024-03-29 ENCOUNTER — Inpatient Hospital Stay: Admitting: Family

## 2024-03-29 ENCOUNTER — Inpatient Hospital Stay: Attending: Hematology & Oncology

## 2024-03-29 ENCOUNTER — Other Ambulatory Visit: Payer: Self-pay

## 2024-03-29 ENCOUNTER — Encounter: Payer: Self-pay | Admitting: Family

## 2024-03-29 ENCOUNTER — Inpatient Hospital Stay

## 2024-03-29 VITALS — BP 148/65 | HR 82 | Temp 98.5°F | Wt 133.0 lb

## 2024-03-29 DIAGNOSIS — D472 Monoclonal gammopathy: Secondary | ICD-10-CM | POA: Insufficient documentation

## 2024-03-29 DIAGNOSIS — E538 Deficiency of other specified B group vitamins: Secondary | ICD-10-CM

## 2024-03-29 DIAGNOSIS — D631 Anemia in chronic kidney disease: Secondary | ICD-10-CM

## 2024-03-29 DIAGNOSIS — D509 Iron deficiency anemia, unspecified: Secondary | ICD-10-CM | POA: Diagnosis present

## 2024-03-29 DIAGNOSIS — D500 Iron deficiency anemia secondary to blood loss (chronic): Secondary | ICD-10-CM | POA: Diagnosis not present

## 2024-03-29 LAB — CBC WITH DIFFERENTIAL (CANCER CENTER ONLY)
Abs Immature Granulocytes: 0.02 K/uL (ref 0.00–0.07)
Basophils Absolute: 0 K/uL (ref 0.0–0.1)
Basophils Relative: 0 %
Eosinophils Absolute: 0.3 K/uL (ref 0.0–0.5)
Eosinophils Relative: 4 %
HCT: 37.3 % (ref 36.0–46.0)
Hemoglobin: 12.4 g/dL (ref 12.0–15.0)
Immature Granulocytes: 0 %
Lymphocytes Relative: 34 %
Lymphs Abs: 2.7 K/uL (ref 0.7–4.0)
MCH: 29.7 pg (ref 26.0–34.0)
MCHC: 33.2 g/dL (ref 30.0–36.0)
MCV: 89.2 fL (ref 80.0–100.0)
Monocytes Absolute: 0.9 K/uL (ref 0.1–1.0)
Monocytes Relative: 11 %
Neutro Abs: 4.1 K/uL (ref 1.7–7.7)
Neutrophils Relative %: 51 %
Platelet Count: 301 K/uL (ref 150–400)
RBC: 4.18 MIL/uL (ref 3.87–5.11)
RDW: 13.6 % (ref 11.5–15.5)
WBC Count: 8 K/uL (ref 4.0–10.5)
nRBC: 0 % (ref 0.0–0.2)

## 2024-03-29 LAB — CMP (CANCER CENTER ONLY)
ALT: 10 U/L (ref 0–44)
AST: 25 U/L (ref 15–41)
Albumin: 4.1 g/dL (ref 3.5–5.0)
Alkaline Phosphatase: 76 U/L (ref 38–126)
Anion gap: 10 (ref 5–15)
BUN: 18 mg/dL (ref 8–23)
CO2: 26 mmol/L (ref 22–32)
Calcium: 9.1 mg/dL (ref 8.9–10.3)
Chloride: 102 mmol/L (ref 98–111)
Creatinine: 0.8 mg/dL (ref 0.44–1.00)
GFR, Estimated: >60 mL/min (ref >60)
Glucose, Bld: 103 mg/dL — ABNORMAL HIGH (ref 70–99)
Potassium: 4.4 mmol/L (ref 3.5–5.1)
Sodium: 138 mmol/L (ref 135–145)
Total Bilirubin: 0.3 mg/dL (ref 0.0–1.2)
Total Protein: 7.6 g/dL (ref 6.5–8.1)

## 2024-03-29 LAB — LACTATE DEHYDROGENASE: LDH: 211 U/L (ref 105–235)

## 2024-03-29 LAB — IRON AND IRON BINDING CAPACITY (CC-WL,HP ONLY)
Iron: 50 ug/dL (ref 28–170)
Saturation Ratios: 20 % (ref 10.4–31.8)
TIBC: 251 ug/dL (ref 250–450)
UIBC: 201 ug/dL

## 2024-03-29 LAB — VITAMIN B12: Vitamin B-12: 1732 pg/mL — ABNORMAL HIGH (ref 180–914)

## 2024-03-29 LAB — FERRITIN: Ferritin: 198 ng/mL (ref 11–307)

## 2024-03-29 NOTE — Progress Notes (Signed)
 Hematology and Oncology Follow Up Visit  Mallory Murphy [id number] 02-01-1946 78 y.o. 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 She is doing really well. She notes occasional mild fatigue after a busy day.  No issues with blood loss. No bruising or petechiae.  No fever, chills, n/v, cough, rash, dizziness, SOB, chest pain, palpitations, abdominal pain or changes in bowel or bladder habits.  No swelling, tenderness, numbness or tingling in her extremities.  Her last fall was over a month ago and thankfully she states that she was not injured. No syncope reported.  She is ambulating with her Rolator.  Appetite and hydration are good. Weight is stable at 133 lbs.   ECOG Performance Status: 0 - Asymptomatic  Medications:  Allergies as of 03/29/2024       Reactions   Macrobid [nitrofurantoin Macrocrystal] Nausea Only        Medication List        Accurate as of March 29, 2024  1:51 PM. If you have any questions, ask your nurse or doctor.          ARANESP  IJ Inject 300 mcg into the skin See admin instructions. 300 mcg into the skin every 3 months for Hgb <11   cephALEXin  500 MG capsule Commonly known as: KEFLEX  Take 1 capsule (500 mg total) by mouth 4 (four) times daily.   diphenhydrAMINE  25 MG tablet Commonly known as: BENADRYL  Take 25 mg by mouth at bedtime.   DULoxetine 30 MG capsule Commonly known as: CYMBALTA Take 30 mg by mouth daily.   oxybutynin  5 MG tablet Commonly known as: DITROPAN  Take 5 mg by mouth at bedtime.   Simponi Aria 50 MG/4ML Soln injection Generic drug: golimumab Inject into the vein every 8 (eight) weeks.   traMADol  50 MG tablet Commonly known as: ULTRAM  Take 1 tablet (50 mg total) by mouth every 6  (six) hours as needed.   Vitamin D3 50 MCG (2000 UT) capsule Take 2,000 Units by mouth daily.        Allergies: Allergies[1]  Past Medical History, Surgical history, Social history, and Family History were reviewed and updated.  Review of Systems: All other 10 point review of systems is negative.   Physical Exam:  vitals were not taken for this visit.   Wt Readings from Last 3 Encounters:  01/16/24 130 lb (59 kg)  11/25/23 134 lb (60.8 kg)  07/22/23 132 lb (59.9 kg)    Ocular: Sclerae unicteric, pupils equal, round and reactive to light Ear-nose-throat: Oropharynx clear, dentition fair Lymphatic: No cervical or supraclavicular adenopathy Lungs no rales or rhonchi, good excursion bilaterally Heart regular rate and rhythm, no murmur appreciated Abd soft, nontender, positive bowel sounds MSK no focal spinal tenderness, no joint edema Neuro: non-focal, well-oriented, appropriate affect Breasts: Deferred   Lab Results  Component Value Date   WBC 7.4 02/25/2024   HGB 11.9 (L) 02/25/2024   HCT 35.7 (L) 02/25/2024   MCV 88.8 02/25/2024   PLT 265 02/25/2024   Lab Results  Component Value Date   FERRITIN 152 12/29/2023   IRON  49 12/29/2023   TIBC 252 12/29/2023   UIBC 203 12/29/2023   IRONPCTSAT 19 12/29/2023   Lab Results  Component Value Date  RETICCTPCT 1.1 10/21/2023   RBC 4.02 02/25/2024   Lab Results  Component Value Date   KPAFRELGTCHN 50.3 (H) 12/29/2023   LAMBDASER 35.2 (H) 12/29/2023   KAPLAMBRATIO 1.43 12/29/2023   Lab Results  Component Value Date   IGGSERUM 1,895 (H) 12/29/2023   IGA 378 12/29/2023   IGMSERUM 100 12/29/2023   Lab Results  Component Value Date   TOTALPROTELP 7.1 12/29/2023   ALBUMINELP 3.5 03/26/2023   A1GS 0.2 03/26/2023   A2GS 0.7 03/26/2023   BETS 0.9 03/26/2023   GAMS 1.6 03/26/2023   MSPIKE 0.7 (H) 03/26/2023   SPEI Comment 03/26/2023     Chemistry      Component Value Date/Time   NA 138 02/25/2024 1427   K  4.5 02/25/2024 1427   CL 103 02/25/2024 1427   CO2 24 02/25/2024 1427   BUN 17 02/25/2024 1427   CREATININE 0.81 02/25/2024 1427   CREATININE 1.19 (H) 05/07/2012 1553      Component Value Date/Time   CALCIUM 9.3 02/25/2024 1427   ALKPHOS 84 02/25/2024 1427   AST 22 02/25/2024 1427   ALT 8 02/25/2024 1427   BILITOT 0.3 02/25/2024 1427       Impression and Plan: Mallory Murphy is a very pleasant 78 yo caucasian female with multifactorial anemia as well as IgG kappa MGUS.   Protein studies pending.  No ESA needed. Her Hgb has been stable and she has not required an injection in over a year now.  Iron  studies and B 12 pending as well. We will replace if needed.  Lab and ESA injection every 3 months and follow-up in 6 months.   Lauraine Pepper, NP 12/16/20251:51 PM     [1]  Allergies Allergen Reactions   Macrobid [Nitrofurantoin Macrocrystal] Nausea Only

## 2024-03-30 LAB — KAPPA/LAMBDA LIGHT CHAINS
Kappa free light chain: 42.4 mg/L — ABNORMAL HIGH (ref 3.3–19.4)
Kappa, lambda light chain ratio: 1.33 (ref 0.26–1.65)
Lambda free light chains: 31.8 mg/L — ABNORMAL HIGH (ref 5.7–26.3)

## 2024-03-31 LAB — MULTIPLE MYELOMA PANEL, SERUM
Albumin SerPl Elph-Mcnc: 3.5 g/dL (ref 2.9–4.4)
Albumin/Glob SerPl: 1 (ref 0.7–1.7)
Alpha 1: 0.2 g/dL (ref 0.0–0.4)
Alpha2 Glob SerPl Elph-Mcnc: 0.7 g/dL (ref 0.4–1.0)
B-Globulin SerPl Elph-Mcnc: 1 g/dL (ref 0.7–1.3)
Gamma Glob SerPl Elph-Mcnc: 1.8 g/dL (ref 0.4–1.8)
Globulin, Total: 3.7 g/dL (ref 2.2–3.9)
IgA: 395 mg/dL (ref 64–422)
IgG (Immunoglobin G), Serum: 1814 mg/dL — ABNORMAL HIGH (ref 586–1602)
IgM (Immunoglobulin M), Srm: 106 mg/dL (ref 26–217)
M Protein SerPl Elph-Mcnc: 0.8 g/dL — ABNORMAL HIGH
Total Protein ELP: 7.2 g/dL (ref 6.0–8.5)

## 2024-04-21 ENCOUNTER — Ambulatory Visit: Payer: Self-pay | Admitting: Family

## 2024-06-28 ENCOUNTER — Inpatient Hospital Stay

## 2024-09-27 ENCOUNTER — Inpatient Hospital Stay

## 2024-09-27 ENCOUNTER — Inpatient Hospital Stay: Admitting: Family
